# Patient Record
Sex: Female | Born: 1935 | Race: White | Hispanic: No | Smoking: Former smoker
Health system: Southern US, Community
[De-identification: ages and names within clinical notes are randomized; demographics above are authoritative.]

## PROBLEM LIST (undated history)

## (undated) DIAGNOSIS — M1990 Unspecified osteoarthritis, unspecified site: Secondary | ICD-10-CM

## (undated) DIAGNOSIS — J189 Pneumonia, unspecified organism: Secondary | ICD-10-CM

## (undated) DIAGNOSIS — I10 Essential (primary) hypertension: Secondary | ICD-10-CM

## (undated) DIAGNOSIS — R339 Retention of urine, unspecified: Secondary | ICD-10-CM

## (undated) DIAGNOSIS — G43909 Migraine, unspecified, not intractable, without status migrainosus: Secondary | ICD-10-CM

## (undated) DIAGNOSIS — M797 Fibromyalgia: Secondary | ICD-10-CM

## (undated) DIAGNOSIS — G4733 Obstructive sleep apnea (adult) (pediatric): Secondary | ICD-10-CM

## (undated) DIAGNOSIS — I480 Paroxysmal atrial fibrillation: Secondary | ICD-10-CM

## (undated) DIAGNOSIS — D649 Anemia, unspecified: Secondary | ICD-10-CM

## (undated) DIAGNOSIS — F419 Anxiety disorder, unspecified: Secondary | ICD-10-CM

## (undated) DIAGNOSIS — R011 Cardiac murmur, unspecified: Secondary | ICD-10-CM

## (undated) DIAGNOSIS — K219 Gastro-esophageal reflux disease without esophagitis: Secondary | ICD-10-CM

## (undated) DIAGNOSIS — I2699 Other pulmonary embolism without acute cor pulmonale: Secondary | ICD-10-CM

## (undated) DIAGNOSIS — J45909 Unspecified asthma, uncomplicated: Secondary | ICD-10-CM

## (undated) DIAGNOSIS — C84A0 Cutaneous T-cell lymphoma, unspecified, unspecified site: Secondary | ICD-10-CM

## (undated) DIAGNOSIS — R310 Gross hematuria: Secondary | ICD-10-CM

## (undated) DIAGNOSIS — I509 Heart failure, unspecified: Secondary | ICD-10-CM

## (undated) DIAGNOSIS — E669 Obesity, unspecified: Secondary | ICD-10-CM

## (undated) DIAGNOSIS — N6019 Diffuse cystic mastopathy of unspecified breast: Secondary | ICD-10-CM

## (undated) DIAGNOSIS — N952 Postmenopausal atrophic vaginitis: Secondary | ICD-10-CM

## (undated) DIAGNOSIS — O2230 Deep phlebothrombosis in pregnancy, unspecified trimester: Secondary | ICD-10-CM

## (undated) DIAGNOSIS — K279 Peptic ulcer, site unspecified, unspecified as acute or chronic, without hemorrhage or perforation: Secondary | ICD-10-CM

## (undated) DIAGNOSIS — I4719 Other supraventricular tachycardia: Secondary | ICD-10-CM

## (undated) DIAGNOSIS — K5792 Diverticulitis of intestine, part unspecified, without perforation or abscess without bleeding: Secondary | ICD-10-CM

## (undated) DIAGNOSIS — I422 Other hypertrophic cardiomyopathy: Secondary | ICD-10-CM

## (undated) DIAGNOSIS — Z9889 Other specified postprocedural states: Secondary | ICD-10-CM

## (undated) DIAGNOSIS — M109 Gout, unspecified: Secondary | ICD-10-CM

## (undated) DIAGNOSIS — I671 Cerebral aneurysm, nonruptured: Secondary | ICD-10-CM

## (undated) DIAGNOSIS — E785 Hyperlipidemia, unspecified: Secondary | ICD-10-CM

## (undated) DIAGNOSIS — I471 Supraventricular tachycardia: Secondary | ICD-10-CM

## (undated) DIAGNOSIS — J449 Chronic obstructive pulmonary disease, unspecified: Secondary | ICD-10-CM

## (undated) DIAGNOSIS — R251 Tremor, unspecified: Secondary | ICD-10-CM

## (undated) HISTORY — DX: Diffuse cystic mastopathy of unspecified breast: N60.19

## (undated) HISTORY — DX: Peptic ulcer, site unspecified, unspecified as acute or chronic, without hemorrhage or perforation: K27.9

## (undated) HISTORY — PX: BLADDER SUSPENSION: SHX72

## (undated) HISTORY — PX: ABDOMINAL HYSTERECTOMY: SHX81

## (undated) HISTORY — DX: Other pulmonary embolism without acute cor pulmonale: I26.99

## (undated) HISTORY — DX: Unspecified osteoarthritis, unspecified site: M19.90

## (undated) HISTORY — DX: Supraventricular tachycardia: I47.1

## (undated) HISTORY — DX: Other specified postprocedural states: Z98.890

## (undated) HISTORY — DX: Obstructive sleep apnea (adult) (pediatric): G47.33

## (undated) HISTORY — PX: TONSILLECTOMY AND ADENOIDECTOMY: SUR1326

## (undated) HISTORY — DX: Chronic obstructive pulmonary disease, unspecified: J44.9

## (undated) HISTORY — DX: Cutaneous T-cell lymphoma, unspecified, unspecified site: C84.A0

## (undated) HISTORY — DX: Cerebral aneurysm, nonruptured: I67.1

## (undated) HISTORY — DX: Other supraventricular tachycardia: I47.19

## (undated) HISTORY — PX: TOTAL KNEE ARTHROPLASTY: SHX125

## (undated) HISTORY — DX: Obesity, unspecified: E66.9

## (undated) HISTORY — PX: FOOT SURGERY: SHX648

## (undated) HISTORY — DX: Migraine, unspecified, not intractable, without status migrainosus: G43.909

## (undated) HISTORY — DX: Diverticulitis of intestine, part unspecified, without perforation or abscess without bleeding: K57.92

## (undated) HISTORY — DX: Deep phlebothrombosis in pregnancy, unspecified trimester: O22.30

## (undated) HISTORY — DX: Retention of urine, unspecified: R33.9

## (undated) HISTORY — DX: Essential (primary) hypertension: I10

## (undated) HISTORY — DX: Anemia, unspecified: D64.9

## (undated) HISTORY — DX: Postmenopausal atrophic vaginitis: N95.2

## (undated) HISTORY — DX: Gastro-esophageal reflux disease without esophagitis: K21.9

## (undated) HISTORY — PX: APPENDECTOMY: SHX54

## (undated) HISTORY — DX: Anxiety disorder, unspecified: F41.9

## (undated) HISTORY — DX: Other hypertrophic cardiomyopathy: I42.2

## (undated) HISTORY — DX: Gout, unspecified: M10.9

## (undated) HISTORY — DX: Hyperlipidemia, unspecified: E78.5

## (undated) HISTORY — DX: Cardiac murmur, unspecified: R01.1

## (undated) HISTORY — DX: Fibromyalgia: M79.7

## (undated) HISTORY — DX: Paroxysmal atrial fibrillation: I48.0

## (undated) HISTORY — DX: Gross hematuria: R31.0

## (undated) HISTORY — DX: Tremor, unspecified: R25.1

## (undated) HISTORY — DX: Pneumonia, unspecified organism: J18.9

## (undated) HISTORY — PX: CARDIOVASCULAR STRESS TEST: SHX262

## (undated) HISTORY — PX: OTHER SURGICAL HISTORY: SHX169

---

## 1966-02-14 HISTORY — PX: INGUINAL HERNIA REPAIR: SUR1180

## 1970-02-14 HISTORY — PX: SHOULDER SURGERY: SHX246

## 1996-02-15 HISTORY — PX: CEREBRAL ANEURYSM REPAIR: SHX164

## 1998-02-16 ENCOUNTER — Encounter (INDEPENDENT_AMBULATORY_CARE_PROVIDER_SITE_OTHER): Payer: Self-pay | Admitting: *Deleted

## 1998-02-16 ENCOUNTER — Encounter (HOSPITAL_BASED_OUTPATIENT_CLINIC_OR_DEPARTMENT_OTHER): Payer: Self-pay | Admitting: Internal Medicine

## 1998-02-16 ENCOUNTER — Ambulatory Visit (HOSPITAL_COMMUNITY): Admission: RE | Admit: 1998-02-16 | Discharge: 1998-02-16 | Payer: Self-pay | Admitting: Internal Medicine

## 1998-04-14 ENCOUNTER — Ambulatory Visit (HOSPITAL_BASED_OUTPATIENT_CLINIC_OR_DEPARTMENT_OTHER): Admission: RE | Admit: 1998-04-14 | Discharge: 1998-04-14 | Payer: Self-pay | Admitting: Orthopaedic Surgery

## 1998-06-24 ENCOUNTER — Other Ambulatory Visit: Admission: RE | Admit: 1998-06-24 | Discharge: 1998-06-24 | Payer: Self-pay | Admitting: Internal Medicine

## 1998-12-03 ENCOUNTER — Encounter: Payer: Self-pay | Admitting: Internal Medicine

## 1999-04-16 ENCOUNTER — Ambulatory Visit (HOSPITAL_BASED_OUTPATIENT_CLINIC_OR_DEPARTMENT_OTHER): Admission: RE | Admit: 1999-04-16 | Discharge: 1999-04-16 | Payer: Self-pay | Admitting: Podiatry

## 1999-07-21 ENCOUNTER — Inpatient Hospital Stay (HOSPITAL_COMMUNITY): Admission: AD | Admit: 1999-07-21 | Discharge: 1999-07-22 | Payer: Self-pay | Admitting: *Deleted

## 1999-07-21 ENCOUNTER — Encounter: Payer: Self-pay | Admitting: *Deleted

## 1999-11-09 ENCOUNTER — Encounter (INDEPENDENT_AMBULATORY_CARE_PROVIDER_SITE_OTHER): Payer: Self-pay | Admitting: Specialist

## 1999-11-09 ENCOUNTER — Encounter: Payer: Self-pay | Admitting: Internal Medicine

## 1999-11-09 ENCOUNTER — Encounter (INDEPENDENT_AMBULATORY_CARE_PROVIDER_SITE_OTHER): Payer: Self-pay | Admitting: *Deleted

## 1999-11-09 ENCOUNTER — Other Ambulatory Visit: Admission: RE | Admit: 1999-11-09 | Discharge: 1999-11-09 | Payer: Self-pay | Admitting: Internal Medicine

## 2001-02-16 ENCOUNTER — Ambulatory Visit (HOSPITAL_COMMUNITY): Admission: RE | Admit: 2001-02-16 | Discharge: 2001-02-16 | Payer: Self-pay | Admitting: Internal Medicine

## 2001-02-16 ENCOUNTER — Encounter: Payer: Self-pay | Admitting: Internal Medicine

## 2001-03-28 ENCOUNTER — Ambulatory Visit (HOSPITAL_COMMUNITY): Admission: RE | Admit: 2001-03-28 | Discharge: 2001-03-28 | Payer: Self-pay | Admitting: Internal Medicine

## 2001-03-28 ENCOUNTER — Encounter: Payer: Self-pay | Admitting: Internal Medicine

## 2001-03-28 ENCOUNTER — Encounter (INDEPENDENT_AMBULATORY_CARE_PROVIDER_SITE_OTHER): Payer: Self-pay | Admitting: *Deleted

## 2002-06-07 ENCOUNTER — Ambulatory Visit (HOSPITAL_COMMUNITY): Admission: RE | Admit: 2002-06-07 | Discharge: 2002-06-07 | Payer: Self-pay | Admitting: Internal Medicine

## 2002-06-07 ENCOUNTER — Encounter: Payer: Self-pay | Admitting: Internal Medicine

## 2002-11-08 ENCOUNTER — Inpatient Hospital Stay (HOSPITAL_COMMUNITY): Admission: EM | Admit: 2002-11-08 | Discharge: 2002-11-13 | Payer: Self-pay | Admitting: Internal Medicine

## 2002-11-08 ENCOUNTER — Encounter: Payer: Self-pay | Admitting: Internal Medicine

## 2002-11-08 ENCOUNTER — Encounter (INDEPENDENT_AMBULATORY_CARE_PROVIDER_SITE_OTHER): Payer: Self-pay | Admitting: *Deleted

## 2002-11-11 ENCOUNTER — Encounter (INDEPENDENT_AMBULATORY_CARE_PROVIDER_SITE_OTHER): Payer: Self-pay

## 2002-11-11 ENCOUNTER — Encounter: Payer: Self-pay | Admitting: Internal Medicine

## 2002-12-17 ENCOUNTER — Encounter: Payer: Self-pay | Admitting: Internal Medicine

## 2004-04-01 ENCOUNTER — Ambulatory Visit: Payer: Self-pay | Admitting: Internal Medicine

## 2004-05-11 ENCOUNTER — Ambulatory Visit: Payer: Self-pay | Admitting: Hematology & Oncology

## 2004-06-14 ENCOUNTER — Ambulatory Visit: Payer: Self-pay | Admitting: Internal Medicine

## 2004-06-22 ENCOUNTER — Ambulatory Visit: Payer: Self-pay | Admitting: Internal Medicine

## 2004-06-29 ENCOUNTER — Ambulatory Visit: Payer: Self-pay | Admitting: Hematology & Oncology

## 2004-07-06 ENCOUNTER — Ambulatory Visit: Payer: Self-pay | Admitting: Internal Medicine

## 2004-10-06 ENCOUNTER — Ambulatory Visit: Payer: Self-pay | Admitting: Internal Medicine

## 2004-10-21 ENCOUNTER — Ambulatory Visit: Payer: Self-pay | Admitting: General Surgery

## 2004-10-27 ENCOUNTER — Ambulatory Visit: Payer: Self-pay | Admitting: Internal Medicine

## 2004-10-28 ENCOUNTER — Ambulatory Visit (HOSPITAL_COMMUNITY): Admission: RE | Admit: 2004-10-28 | Discharge: 2004-10-28 | Payer: Self-pay | Admitting: Internal Medicine

## 2004-10-29 ENCOUNTER — Ambulatory Visit: Payer: Self-pay | Admitting: Internal Medicine

## 2004-11-08 ENCOUNTER — Ambulatory Visit: Payer: Self-pay | Admitting: General Surgery

## 2005-04-25 ENCOUNTER — Ambulatory Visit: Payer: Self-pay | Admitting: General Surgery

## 2005-10-04 ENCOUNTER — Ambulatory Visit: Payer: Self-pay | Admitting: Internal Medicine

## 2005-10-24 ENCOUNTER — Ambulatory Visit: Payer: Self-pay | Admitting: General Surgery

## 2005-10-27 ENCOUNTER — Ambulatory Visit: Payer: Self-pay | Admitting: General Surgery

## 2005-12-05 ENCOUNTER — Ambulatory Visit: Payer: Self-pay | Admitting: Internal Medicine

## 2006-02-23 ENCOUNTER — Ambulatory Visit: Payer: Self-pay | Admitting: Internal Medicine

## 2006-04-17 ENCOUNTER — Ambulatory Visit: Payer: Self-pay | Admitting: General Surgery

## 2006-05-08 ENCOUNTER — Ambulatory Visit: Payer: Self-pay | Admitting: Internal Medicine

## 2006-05-08 ENCOUNTER — Inpatient Hospital Stay (HOSPITAL_COMMUNITY): Admission: RE | Admit: 2006-05-08 | Discharge: 2006-05-11 | Payer: Self-pay | Admitting: Orthopedic Surgery

## 2006-10-27 ENCOUNTER — Ambulatory Visit: Payer: Self-pay | Admitting: General Surgery

## 2006-11-03 ENCOUNTER — Ambulatory Visit: Payer: Self-pay | Admitting: Internal Medicine

## 2006-11-03 LAB — CONVERTED CEMR LAB
ALT: 19 units/L (ref 0–35)
Albumin: 3.7 g/dL (ref 3.5–5.2)
CRP, High Sensitivity: 16 — ABNORMAL HIGH (ref 0.00–5.00)
Cholesterol: 231 mg/dL (ref 0–200)
Creatinine, Ser: 0.6 mg/dL (ref 0.4–1.2)
Direct LDL: 97.1 mg/dL
Eosinophils Absolute: 0.2 10*3/uL (ref 0.0–0.6)
Eosinophils Relative: 2.5 % (ref 0.0–5.0)
GFR calc Af Amer: 126 mL/min
Glucose, Bld: 78 mg/dL (ref 70–99)
HCT: 31 % — ABNORMAL LOW (ref 36.0–46.0)
Hemoglobin, Urine: NEGATIVE
Hemoglobin: 10.7 g/dL — ABNORMAL LOW (ref 12.0–15.0)
Ketones, ur: NEGATIVE mg/dL
Lymphocytes Relative: 26.8 % (ref 12.0–46.0)
MCHC: 34.5 g/dL (ref 30.0–36.0)
Monocytes Relative: 9 % (ref 3.0–11.0)
Mucus, UA: NEGATIVE
Platelets: 253 10*3/uL (ref 150–400)
Sodium: 143 meq/L (ref 135–145)
TSH: 2.48 microintl units/mL (ref 0.35–5.50)
Total Bilirubin: 0.5 mg/dL (ref 0.3–1.2)
Total CHOL/HDL Ratio: 5.1
Total Protein: 6.2 g/dL (ref 6.0–8.3)
Triglycerides: 442 mg/dL (ref 0–149)
Urine Glucose: NEGATIVE mg/dL
Urobilinogen, UA: 0.2 (ref 0.0–1.0)
pH: 6 (ref 5.0–8.0)

## 2006-11-20 ENCOUNTER — Ambulatory Visit: Payer: Self-pay | Admitting: Hematology & Oncology

## 2006-12-10 ENCOUNTER — Encounter: Admission: RE | Admit: 2006-12-10 | Discharge: 2006-12-10 | Payer: Self-pay | Admitting: Orthopedic Surgery

## 2006-12-15 ENCOUNTER — Ambulatory Visit: Payer: Self-pay | Admitting: Internal Medicine

## 2006-12-29 ENCOUNTER — Encounter: Payer: Self-pay | Admitting: Internal Medicine

## 2006-12-29 LAB — CBC & DIFF AND RETIC
BASO%: 0.3 % (ref 0.0–2.0)
Basophils Absolute: 0 10*3/uL (ref 0.0–0.1)
EOS%: 4.1 % (ref 0.0–7.0)
HCT: 33.9 % — ABNORMAL LOW (ref 34.8–46.6)
LYMPH%: 27.1 % (ref 14.0–48.0)
MCH: 32.8 pg (ref 26.0–34.0)
MCHC: 36 g/dL (ref 32.0–36.0)
MCV: 91 fL (ref 81.0–101.0)
MONO%: 8 % (ref 0.0–13.0)
NEUT%: 60.5 % (ref 39.6–76.8)
Platelets: 264 10*3/uL (ref 145–400)

## 2006-12-29 LAB — CHCC SMEAR

## 2007-01-01 ENCOUNTER — Ambulatory Visit: Payer: Self-pay | Admitting: Internal Medicine

## 2007-01-02 LAB — PROTEIN ELECTROPHORESIS, SERUM
Alpha-1-Globulin: 5.8 % — ABNORMAL HIGH (ref 2.9–4.9)
Alpha-2-Globulin: 14.3 % — ABNORMAL HIGH (ref 7.1–11.8)
Total Protein, Serum Electrophoresis: 6.9 g/dL (ref 6.0–8.3)

## 2007-01-02 LAB — TRANSFERRIN RECEPTOR, SOLUABLE: Transferrin Receptor, Soluble: 17.9 nmol/L

## 2007-01-02 LAB — ERYTHROPOIETIN: Erythropoietin: 14.1 m[IU]/mL (ref 2.6–34.0)

## 2007-01-02 LAB — VITAMIN B12: Vitamin B-12: 602 pg/mL (ref 211–911)

## 2007-01-09 ENCOUNTER — Ambulatory Visit: Payer: Self-pay | Admitting: Internal Medicine

## 2007-01-09 ENCOUNTER — Encounter: Payer: Self-pay | Admitting: Internal Medicine

## 2007-01-17 ENCOUNTER — Encounter: Payer: Self-pay | Admitting: Internal Medicine

## 2007-02-01 ENCOUNTER — Telehealth: Payer: Self-pay | Admitting: Internal Medicine

## 2007-02-03 ENCOUNTER — Encounter: Payer: Self-pay | Admitting: *Deleted

## 2007-02-03 DIAGNOSIS — Z8679 Personal history of other diseases of the circulatory system: Secondary | ICD-10-CM | POA: Insufficient documentation

## 2007-02-03 DIAGNOSIS — Z86718 Personal history of other venous thrombosis and embolism: Secondary | ICD-10-CM

## 2007-02-03 DIAGNOSIS — I10 Essential (primary) hypertension: Secondary | ICD-10-CM

## 2007-02-03 DIAGNOSIS — R5382 Chronic fatigue, unspecified: Secondary | ICD-10-CM

## 2007-02-03 DIAGNOSIS — G43909 Migraine, unspecified, not intractable, without status migrainosus: Secondary | ICD-10-CM | POA: Insufficient documentation

## 2007-02-03 DIAGNOSIS — I671 Cerebral aneurysm, nonruptured: Secondary | ICD-10-CM

## 2007-02-03 DIAGNOSIS — D539 Nutritional anemia, unspecified: Secondary | ICD-10-CM | POA: Insufficient documentation

## 2007-02-03 DIAGNOSIS — IMO0001 Reserved for inherently not codable concepts without codable children: Secondary | ICD-10-CM

## 2007-02-03 DIAGNOSIS — E785 Hyperlipidemia, unspecified: Secondary | ICD-10-CM

## 2007-02-03 DIAGNOSIS — Z9889 Other specified postprocedural states: Secondary | ICD-10-CM

## 2007-02-03 DIAGNOSIS — Z96659 Presence of unspecified artificial knee joint: Secondary | ICD-10-CM

## 2007-02-03 DIAGNOSIS — Z9079 Acquired absence of other genital organ(s): Secondary | ICD-10-CM | POA: Insufficient documentation

## 2007-02-03 DIAGNOSIS — Z9089 Acquired absence of other organs: Secondary | ICD-10-CM

## 2007-02-03 DIAGNOSIS — K219 Gastro-esophageal reflux disease without esophagitis: Secondary | ICD-10-CM | POA: Insufficient documentation

## 2007-02-03 DIAGNOSIS — M1990 Unspecified osteoarthritis, unspecified site: Secondary | ICD-10-CM | POA: Insufficient documentation

## 2007-02-15 HISTORY — PX: CARDIAC CATHETERIZATION: SHX172

## 2007-02-15 HISTORY — PX: KNEE ARTHROSCOPY: SUR90

## 2007-02-21 ENCOUNTER — Ambulatory Visit: Payer: Self-pay | Admitting: Hematology & Oncology

## 2007-02-22 ENCOUNTER — Ambulatory Visit: Payer: Self-pay | Admitting: Internal Medicine

## 2007-02-22 LAB — CONVERTED CEMR LAB
AST: 17 units/L (ref 0–37)
Basophils Absolute: 0 10*3/uL (ref 0.0–0.1)
Basophils Relative: 0.2 % (ref 0.0–1.0)
MCHC: 35.9 g/dL (ref 30.0–36.0)
MCV: 90.6 fL (ref 78.0–100.0)
Platelets: 204 10*3/uL (ref 150–400)
WBC: 6.1 10*3/uL (ref 4.5–10.5)

## 2007-02-25 ENCOUNTER — Encounter: Payer: Self-pay | Admitting: Internal Medicine

## 2007-04-12 DIAGNOSIS — K449 Diaphragmatic hernia without obstruction or gangrene: Secondary | ICD-10-CM | POA: Insufficient documentation

## 2007-04-12 DIAGNOSIS — D126 Benign neoplasm of colon, unspecified: Secondary | ICD-10-CM | POA: Insufficient documentation

## 2007-04-12 DIAGNOSIS — K5730 Diverticulosis of large intestine without perforation or abscess without bleeding: Secondary | ICD-10-CM | POA: Insufficient documentation

## 2007-06-04 ENCOUNTER — Ambulatory Visit: Payer: Self-pay | Admitting: Cardiology

## 2007-06-06 ENCOUNTER — Ambulatory Visit: Payer: Self-pay

## 2007-06-06 ENCOUNTER — Encounter: Payer: Self-pay | Admitting: Cardiology

## 2007-06-21 ENCOUNTER — Telehealth (INDEPENDENT_AMBULATORY_CARE_PROVIDER_SITE_OTHER): Payer: Self-pay | Admitting: *Deleted

## 2007-06-28 ENCOUNTER — Ambulatory Visit: Payer: Self-pay | Admitting: Cardiology

## 2007-06-28 LAB — CONVERTED CEMR LAB
BUN: 17 mg/dL (ref 6–23)
Basophils Absolute: 0 10*3/uL (ref 0.0–0.1)
Basophils Relative: 0 % (ref 0–1)
CO2: 23 meq/L (ref 19–32)
Calcium: 9.6 mg/dL (ref 8.4–10.5)
Chloride: 99 meq/L (ref 96–112)
Creatinine, Ser: 0.83 mg/dL (ref 0.40–1.20)
Eosinophils Relative: 0 % (ref 0–5)
Lymphs Abs: 1.6 10*3/uL (ref 0.7–4.0)
MCV: 95.7 fL (ref 78.0–100.0)
Monocytes Absolute: 0.6 10*3/uL (ref 0.1–1.0)
Monocytes Relative: 8 % (ref 3–12)
Neutrophils Relative %: 70 % (ref 43–77)
Potassium: 3.6 meq/L (ref 3.5–5.3)
Prothrombin Time: 13.4 s (ref 11.6–15.2)
RDW: 14 % (ref 11.5–15.5)
Sodium: 137 meq/L (ref 135–145)

## 2007-07-06 ENCOUNTER — Inpatient Hospital Stay (HOSPITAL_BASED_OUTPATIENT_CLINIC_OR_DEPARTMENT_OTHER): Admission: RE | Admit: 2007-07-06 | Discharge: 2007-07-06 | Payer: Self-pay | Admitting: Internal Medicine

## 2007-07-06 ENCOUNTER — Ambulatory Visit: Payer: Self-pay | Admitting: Internal Medicine

## 2007-07-27 ENCOUNTER — Ambulatory Visit: Payer: Self-pay | Admitting: Cardiology

## 2007-08-07 ENCOUNTER — Ambulatory Visit: Payer: Self-pay | Admitting: Internal Medicine

## 2007-08-08 ENCOUNTER — Encounter (INDEPENDENT_AMBULATORY_CARE_PROVIDER_SITE_OTHER): Payer: Self-pay | Admitting: Family Medicine

## 2007-08-21 ENCOUNTER — Ambulatory Visit: Payer: Self-pay | Admitting: Internal Medicine

## 2007-08-21 ENCOUNTER — Encounter: Payer: Self-pay | Admitting: Internal Medicine

## 2007-08-23 ENCOUNTER — Encounter: Payer: Self-pay | Admitting: Internal Medicine

## 2007-08-28 ENCOUNTER — Ambulatory Visit: Payer: Self-pay | Admitting: Cardiology

## 2007-08-28 ENCOUNTER — Telehealth: Payer: Self-pay | Admitting: Internal Medicine

## 2007-08-29 ENCOUNTER — Ambulatory Visit: Payer: Self-pay | Admitting: Internal Medicine

## 2007-08-30 ENCOUNTER — Telehealth: Payer: Self-pay | Admitting: Internal Medicine

## 2007-08-30 LAB — CONVERTED CEMR LAB
BUN: 17 mg/dL (ref 6–23)
Basophils Absolute: 0 10*3/uL (ref 0.0–0.1)
Basophils Relative: 0.7 % (ref 0.0–1.0)
CO2: 24 meq/L (ref 19–32)
Calcium: 9.9 mg/dL (ref 8.4–10.5)
Chloride: 108 meq/L (ref 96–112)
Creatinine, Ser: 0.8 mg/dL (ref 0.4–1.2)
Eosinophils Relative: 1.8 % (ref 0.0–5.0)
GFR calc non Af Amer: 75 mL/min
Glucose, Bld: 117 mg/dL — ABNORMAL HIGH (ref 70–99)
HCT: 35 % — ABNORMAL LOW (ref 36.0–46.0)
Hemoglobin, Urine: NEGATIVE
Hemoglobin: 12.1 g/dL (ref 12.0–15.0)
Leukocytes, UA: NEGATIVE
MCHC: 34.6 g/dL (ref 30.0–36.0)
Monocytes Relative: 10.3 % (ref 3.0–12.0)
Neutro Abs: 4.7 10*3/uL (ref 1.4–7.7)
Platelets: 236 10*3/uL (ref 150–400)
Potassium: 4.8 meq/L (ref 3.5–5.1)
Urobilinogen, UA: 0.2 (ref 0.0–1.0)
WBC: 7 10*3/uL (ref 4.5–10.5)

## 2007-09-04 ENCOUNTER — Encounter (INDEPENDENT_AMBULATORY_CARE_PROVIDER_SITE_OTHER): Payer: Self-pay | Admitting: *Deleted

## 2007-09-04 ENCOUNTER — Ambulatory Visit (HOSPITAL_COMMUNITY): Admission: RE | Admit: 2007-09-04 | Discharge: 2007-09-04 | Payer: Self-pay | Admitting: Internal Medicine

## 2007-10-16 ENCOUNTER — Ambulatory Visit: Payer: Self-pay | Admitting: Cardiology

## 2007-10-16 ENCOUNTER — Telehealth: Payer: Self-pay | Admitting: Internal Medicine

## 2007-10-30 ENCOUNTER — Ambulatory Visit: Payer: Self-pay | Admitting: General Surgery

## 2007-11-06 ENCOUNTER — Ambulatory Visit: Payer: Self-pay | Admitting: Internal Medicine

## 2007-11-06 DIAGNOSIS — I421 Obstructive hypertrophic cardiomyopathy: Secondary | ICD-10-CM | POA: Insufficient documentation

## 2007-11-06 DIAGNOSIS — I951 Orthostatic hypotension: Secondary | ICD-10-CM | POA: Insufficient documentation

## 2007-11-07 ENCOUNTER — Telehealth: Payer: Self-pay | Admitting: Internal Medicine

## 2007-11-07 ENCOUNTER — Encounter: Payer: Self-pay | Admitting: Internal Medicine

## 2007-11-09 ENCOUNTER — Encounter: Payer: Self-pay | Admitting: Internal Medicine

## 2007-11-13 ENCOUNTER — Telehealth: Payer: Self-pay | Admitting: Internal Medicine

## 2007-11-27 ENCOUNTER — Encounter: Payer: Self-pay | Admitting: Internal Medicine

## 2007-12-03 ENCOUNTER — Telehealth: Payer: Self-pay | Admitting: Internal Medicine

## 2008-01-02 ENCOUNTER — Ambulatory Visit: Payer: Self-pay | Admitting: Ophthalmology

## 2008-01-14 ENCOUNTER — Ambulatory Visit: Payer: Self-pay | Admitting: Ophthalmology

## 2008-02-06 ENCOUNTER — Ambulatory Visit: Payer: Self-pay | Admitting: Ophthalmology

## 2008-02-12 ENCOUNTER — Telehealth (INDEPENDENT_AMBULATORY_CARE_PROVIDER_SITE_OTHER): Payer: Self-pay | Admitting: *Deleted

## 2008-03-03 ENCOUNTER — Ambulatory Visit: Payer: Self-pay | Admitting: Ophthalmology

## 2008-03-19 ENCOUNTER — Encounter: Payer: Self-pay | Admitting: Internal Medicine

## 2008-05-07 ENCOUNTER — Ambulatory Visit: Payer: Self-pay | Admitting: Cardiology

## 2008-05-07 ENCOUNTER — Encounter: Payer: Self-pay | Admitting: Internal Medicine

## 2008-05-13 ENCOUNTER — Ambulatory Visit: Payer: Self-pay | Admitting: Cardiovascular Disease

## 2008-06-12 ENCOUNTER — Telehealth: Payer: Self-pay | Admitting: Internal Medicine

## 2008-08-22 ENCOUNTER — Telehealth: Payer: Self-pay | Admitting: Internal Medicine

## 2008-08-25 ENCOUNTER — Telehealth: Payer: Self-pay | Admitting: Internal Medicine

## 2008-08-27 ENCOUNTER — Ambulatory Visit: Payer: Self-pay | Admitting: Internal Medicine

## 2008-08-27 DIAGNOSIS — R5381 Other malaise: Secondary | ICD-10-CM | POA: Insufficient documentation

## 2008-08-27 DIAGNOSIS — R5383 Other fatigue: Secondary | ICD-10-CM

## 2008-08-28 ENCOUNTER — Telehealth: Payer: Self-pay | Admitting: Internal Medicine

## 2008-08-29 ENCOUNTER — Ambulatory Visit: Payer: Self-pay | Admitting: Cardiology

## 2008-08-29 ENCOUNTER — Encounter: Payer: Self-pay | Admitting: Cardiology

## 2008-09-01 ENCOUNTER — Telehealth: Payer: Self-pay | Admitting: Internal Medicine

## 2008-09-02 ENCOUNTER — Inpatient Hospital Stay (HOSPITAL_COMMUNITY): Admission: AD | Admit: 2008-09-02 | Discharge: 2008-09-05 | Payer: Self-pay | Admitting: Gastroenterology

## 2008-09-02 ENCOUNTER — Ambulatory Visit: Payer: Self-pay | Admitting: Gastroenterology

## 2008-09-02 ENCOUNTER — Encounter (INDEPENDENT_AMBULATORY_CARE_PROVIDER_SITE_OTHER): Payer: Self-pay | Admitting: *Deleted

## 2008-09-02 DIAGNOSIS — Z860100 Personal history of colon polyps, unspecified: Secondary | ICD-10-CM | POA: Insufficient documentation

## 2008-09-08 ENCOUNTER — Telehealth: Payer: Self-pay | Admitting: Internal Medicine

## 2008-09-08 ENCOUNTER — Ambulatory Visit: Payer: Self-pay | Admitting: Internal Medicine

## 2008-09-08 LAB — CONVERTED CEMR LAB

## 2008-09-14 DIAGNOSIS — O2230 Deep phlebothrombosis in pregnancy, unspecified trimester: Secondary | ICD-10-CM

## 2008-09-14 HISTORY — DX: Deep phlebothrombosis in pregnancy, unspecified trimester: O22.30

## 2008-09-15 ENCOUNTER — Telehealth: Payer: Self-pay | Admitting: Internal Medicine

## 2008-09-17 ENCOUNTER — Ambulatory Visit: Payer: Self-pay | Admitting: Internal Medicine

## 2008-09-17 ENCOUNTER — Telehealth: Payer: Self-pay | Admitting: Internal Medicine

## 2008-09-17 LAB — CONVERTED CEMR LAB
Bilirubin Urine: NEGATIVE
Hemoglobin, Urine: NEGATIVE
Leukocytes, UA: NEGATIVE
Specific Gravity, Urine: 1.02 (ref 1.000–1.030)
Total Protein, Urine: NEGATIVE mg/dL
Urine Glucose: NEGATIVE mg/dL
Urobilinogen, UA: 0.2 (ref 0.0–1.0)

## 2008-10-06 ENCOUNTER — Telehealth: Payer: Self-pay | Admitting: Internal Medicine

## 2008-10-09 ENCOUNTER — Ambulatory Visit: Payer: Self-pay | Admitting: Cardiovascular Disease

## 2008-10-09 ENCOUNTER — Ambulatory Visit: Payer: Self-pay | Admitting: Internal Medicine

## 2008-10-09 ENCOUNTER — Encounter: Payer: Self-pay | Admitting: Cardiology

## 2008-10-09 ENCOUNTER — Telehealth: Payer: Self-pay | Admitting: Internal Medicine

## 2008-10-09 ENCOUNTER — Ambulatory Visit: Payer: Self-pay

## 2008-10-09 DIAGNOSIS — K253 Acute gastric ulcer without hemorrhage or perforation: Secondary | ICD-10-CM | POA: Insufficient documentation

## 2008-10-09 DIAGNOSIS — I82409 Acute embolism and thrombosis of unspecified deep veins of unspecified lower extremity: Secondary | ICD-10-CM | POA: Insufficient documentation

## 2008-10-09 LAB — CONVERTED CEMR LAB: POC INR: 1

## 2008-10-13 ENCOUNTER — Ambulatory Visit: Payer: Self-pay | Admitting: Cardiovascular Disease

## 2008-10-13 ENCOUNTER — Encounter: Payer: Self-pay | Admitting: Cardiovascular Disease

## 2008-10-13 DIAGNOSIS — I8010 Phlebitis and thrombophlebitis of unspecified femoral vein: Secondary | ICD-10-CM

## 2008-10-13 LAB — CONVERTED CEMR LAB: POC INR: 1.7

## 2008-10-15 ENCOUNTER — Ambulatory Visit: Payer: Self-pay | Admitting: Internal Medicine

## 2008-10-15 LAB — CONVERTED CEMR LAB: POC INR: 1.8

## 2008-10-17 ENCOUNTER — Ambulatory Visit: Payer: Self-pay | Admitting: Internal Medicine

## 2008-10-23 ENCOUNTER — Ambulatory Visit: Payer: Self-pay | Admitting: Cardiovascular Disease

## 2008-10-23 ENCOUNTER — Ambulatory Visit: Payer: Self-pay | Admitting: Internal Medicine

## 2008-10-23 LAB — CONVERTED CEMR LAB: POC INR: 3.1

## 2008-10-30 ENCOUNTER — Telehealth: Payer: Self-pay | Admitting: Internal Medicine

## 2008-11-04 ENCOUNTER — Telehealth (INDEPENDENT_AMBULATORY_CARE_PROVIDER_SITE_OTHER): Payer: Self-pay | Admitting: *Deleted

## 2008-11-17 ENCOUNTER — Ambulatory Visit: Payer: Self-pay | Admitting: Internal Medicine

## 2008-11-20 ENCOUNTER — Ambulatory Visit: Payer: Self-pay | Admitting: General Surgery

## 2008-12-08 ENCOUNTER — Ambulatory Visit: Payer: Self-pay | Admitting: Cardiovascular Disease

## 2008-12-08 LAB — CONVERTED CEMR LAB: POC INR: 2.8

## 2008-12-15 ENCOUNTER — Ambulatory Visit: Payer: Self-pay | Admitting: Internal Medicine

## 2008-12-19 ENCOUNTER — Inpatient Hospital Stay: Payer: Self-pay | Admitting: Student

## 2008-12-19 ENCOUNTER — Ambulatory Visit: Payer: Self-pay | Admitting: Internal Medicine

## 2009-01-02 ENCOUNTER — Encounter: Payer: Self-pay | Admitting: Internal Medicine

## 2009-01-13 ENCOUNTER — Encounter: Payer: Self-pay | Admitting: Cardiology

## 2009-01-13 ENCOUNTER — Telehealth (INDEPENDENT_AMBULATORY_CARE_PROVIDER_SITE_OTHER): Payer: Self-pay | Admitting: Cardiology

## 2009-01-14 ENCOUNTER — Ambulatory Visit: Payer: Self-pay | Admitting: Internal Medicine

## 2009-01-14 ENCOUNTER — Encounter: Payer: Self-pay | Admitting: Internal Medicine

## 2009-01-16 ENCOUNTER — Ambulatory Visit: Payer: Self-pay | Admitting: Internal Medicine

## 2009-02-04 ENCOUNTER — Ambulatory Visit: Payer: Self-pay | Admitting: Internal Medicine

## 2009-02-10 ENCOUNTER — Telehealth: Payer: Self-pay | Admitting: Internal Medicine

## 2009-02-14 ENCOUNTER — Ambulatory Visit: Payer: Self-pay | Admitting: Internal Medicine

## 2009-02-27 ENCOUNTER — Ambulatory Visit: Payer: Self-pay | Admitting: Internal Medicine

## 2009-03-03 ENCOUNTER — Ambulatory Visit: Payer: Self-pay | Admitting: Gastroenterology

## 2009-03-09 ENCOUNTER — Ambulatory Visit: Payer: Self-pay | Admitting: Gastroenterology

## 2009-03-17 ENCOUNTER — Ambulatory Visit: Payer: Self-pay | Admitting: Internal Medicine

## 2009-04-08 ENCOUNTER — Telehealth: Payer: Self-pay | Admitting: Cardiology

## 2009-04-09 ENCOUNTER — Observation Stay: Payer: Self-pay | Admitting: Internal Medicine

## 2009-04-09 ENCOUNTER — Ambulatory Visit: Payer: Self-pay | Admitting: Cardiovascular Disease

## 2009-04-10 ENCOUNTER — Encounter: Payer: Self-pay | Admitting: Cardiovascular Disease

## 2009-04-14 ENCOUNTER — Ambulatory Visit: Payer: Self-pay | Admitting: Internal Medicine

## 2009-05-13 ENCOUNTER — Ambulatory Visit: Payer: Self-pay | Admitting: Pain Medicine

## 2009-05-13 ENCOUNTER — Other Ambulatory Visit: Payer: Self-pay | Admitting: Pain Medicine

## 2009-07-29 ENCOUNTER — Ambulatory Visit: Payer: Self-pay | Admitting: Pain Medicine

## 2009-12-25 ENCOUNTER — Ambulatory Visit: Payer: Self-pay | Admitting: Internal Medicine

## 2009-12-25 ENCOUNTER — Encounter: Payer: Self-pay | Admitting: Internal Medicine

## 2010-01-14 ENCOUNTER — Encounter: Payer: Self-pay | Admitting: Internal Medicine

## 2010-03-14 LAB — CONVERTED CEMR LAB
Albumin: 3.9 g/dL (ref 3.5–5.2)
Alkaline Phosphatase: 88 units/L (ref 39–117)
Basophils Absolute: 0 10*3/uL (ref 0.0–0.1)
Bilirubin Urine: NEGATIVE
Creatinine, Ser: 0.7 mg/dL (ref 0.4–1.2)
Eosinophils Relative: 2.6 % (ref 0.0–5.0)
GFR calc non Af Amer: 86.95 mL/min (ref >60)
Glucose, Bld: 105 mg/dL — ABNORMAL HIGH (ref 70–99)
HCT: 35.5 % — ABNORMAL LOW (ref 36.0–46.0)
Hemoglobin, Urine: NEGATIVE
Ketones, ur: 15 mg/dL
Leukocytes, UA: NEGATIVE
Lipase: 5 units/L — ABNORMAL LOW (ref 11.0–59.0)
Lymphs Abs: 1.4 10*3/uL (ref 0.7–4.0)
MCHC: 34.7 g/dL (ref 30.0–36.0)
MCV: 92.7 fL (ref 78.0–100.0)
Monocytes Absolute: 0.3 10*3/uL (ref 0.1–1.0)
Neutro Abs: 4.7 10*3/uL (ref 1.4–7.7)
Neutrophils Relative %: 71.2 % (ref 43.0–77.0)
Nitrite: NEGATIVE
Potassium: 3.5 meq/L (ref 3.5–5.1)
Sed Rate: 19 mm/hr (ref 0–22)
Sodium: 145 meq/L (ref 135–145)
Total Bilirubin: 0.5 mg/dL (ref 0.3–1.2)
Total Protein, Urine: NEGATIVE mg/dL
Urine Glucose: NEGATIVE mg/dL
WBC: 6.6 10*3/uL (ref 4.5–10.5)
pH: 5.5 (ref 5.0–8.0)

## 2010-03-18 NOTE — Procedures (Signed)
Summary: EGD   EGD  Procedure date:  02/16/2001  Findings:      Location: [facility]    EGD  Procedure date:  02/16/2001  Findings:      Location: [facility]   Patient Name: Hailey [name], Hailey [name] MRN: [id number] Procedure Procedures: Panendoscopy (EGD) CPT: 43235.    with electrocoagulation and/ or injection for bleeding. CPT: 43255.  Personnel: Endoscopist: Wilhemina Bonito. Marina Goodell, MD.  Exam Location: Exam performed in Endoscopy Suite.  Patient Consent: Procedure, Alternatives, Risks and Benefits discussed, consent obtained,  Indications Symptoms: Abdominal pain, location: epigastric. Melena. documentation: patient report.  History  Pre-Exam Physical: Performed Feb 16, 2001  Entire physical exam was normal. Entire physical exam was normal.  Exam Exam Info: Maximum depth of insertion Duodenum, intended Duodenum. Patient position: on left side. Vocal cords visualized. Gastric retroflexion performed. Images taken. ASA Classification: II. Tolerance: excellent.  Sedation Meds: Residual sedation present from prior procedure today. Demerol 10 mg. given IV. Versed 3 mg. given IV.  Monitoring: BP and pulse monitoring done. Oximetry used. Supplemental O2 given  Fluoroscopy: Fluoroscopy was not used.  Findings - MUCOSAL ABNORMALITY: Antrum. Comment: [place] stomach with active oozing at time of endoscopy.  - Bicap/Coagulation: Antrum. Gold Probe used. Setting: 7. Total applications: 6. Outcome: hemostasis achieved. Comments: Area in prepyloric region treated.   Assessment  Diagnoses: [place] stomach (gastric antral vascular ectasia). .   Events  Unplanned Intervention: No unplanned interventions were required.  Unplanned Events: There were no complications. Plans Comments: Continue Nexium bid and hold celebrex for 2 weeks Disposition: After procedure patient sent to recovery. After recovery patient sent home.  Scheduling: Call office for  appointment, to Wilhemina Bonito. Marina Goodell, MD, in about 4 weeks    This report was created from the original endoscopy report, which was reviewed and signed by the above listed endoscopist.    cc. Micheal Norins,MD

## 2010-03-18 NOTE — Letter (Signed)
[facility]   Imported By: Harlon Flor 04/20/2009 15:49:47  _____________________________________________________________________  External Attachment:    Type:   Image     Comment:   External Document

## 2010-03-18 NOTE — Progress Notes (Signed)
Summary: chest pain   Phone Note Call from Patient   Caller: Daughter Summary of Call: pts daughter called to state that pt developed CP last night.  pt took several doses of antacids without relief.  took baby aspirin and had some relief.  pt began to have CP again this morning for which she took an additional aspirin.  Pt is currently experiencing some chest tightness.  daughter wanted pt to be seen in office this afternoon. instructed daughter that if pt was having active pain and/or tightness that she needed to be evaluated by ED.  pts daughter questioned if pt could not have an EKG done.  instructed daughter that EKG changes are not always present with MI- that bloodwork along with EKG is most diagnostic.  Pts daughter will discuss this with pt and let us know if we are needed. Initial call taken by: Charlena Cross, RN, BSN,  April 08, 2009 2:22 PM

## 2010-04-23 ENCOUNTER — Encounter: Payer: Self-pay | Admitting: Cardiology

## 2010-04-23 ENCOUNTER — Institutional Professional Consult (permissible substitution) (INDEPENDENT_AMBULATORY_CARE_PROVIDER_SITE_OTHER): Payer: Medicare Other | Admitting: Cardiology

## 2010-04-23 DIAGNOSIS — I422 Other hypertrophic cardiomyopathy: Secondary | ICD-10-CM

## 2010-04-30 ENCOUNTER — Telehealth: Payer: Self-pay | Admitting: Cardiology

## 2010-04-30 ENCOUNTER — Ambulatory Visit (HOSPITAL_COMMUNITY): Payer: Medicare Other | Attending: Cardiology

## 2010-04-30 ENCOUNTER — Encounter: Payer: Self-pay | Admitting: Cardiology

## 2010-04-30 DIAGNOSIS — I421 Obstructive hypertrophic cardiomyopathy: Secondary | ICD-10-CM | POA: Insufficient documentation

## 2010-05-03 ENCOUNTER — Other Ambulatory Visit (HOSPITAL_COMMUNITY): Payer: Medicare Other

## 2010-05-04 ENCOUNTER — Telehealth: Payer: Self-pay | Admitting: Cardiology

## 2010-05-04 ENCOUNTER — Ambulatory Visit: Payer: Self-pay | Admitting: General Practice

## 2010-05-04 NOTE — Assessment & Plan Note (Signed)
Summary: Surgical Clearance for Shoulder and Knee Repair/Was a Wall Pt...  Medications Added GABAPENTIN 100 MG CAPS (GABAPENTIN) take two tablets three times a day CITALOPRAM HYDROBROMIDE 40 MG TABS (CITALOPRAM HYDROBROMIDE) one daily TORSEMIDE 20 MG TABS (TORSEMIDE) 1 1/2 tablet once daily SPIRIVA HANDIHALER 18 MCG CAPS (TIOTROPIUM BROMIDE MONOHYDRATE) as needed ADVAIR DISKUS 250-50 MCG/DOSE AEPB (FLUTICASONE-SALMETEROL) 250/10 micrograms daily MELATONIN 3 MG TABS (MELATONIN) one tablet at bedtime MIRALAX  POWD (POLYETHYLENE GLYCOL 3350)       Allergies Added:   Visit Type:  Follow-up Referring Provider:  n/a Primary Provider:  Dr. Randa Lynn  CC:  Pre op clearance for surgery on left shoulder with Dr. Ernest Pine @ [facility] . Denies chest pain or shortness of breath..  History of Present Illness: 77 yo with history of hypertrophic cardiomyopathy, GI bleeding, venous thromboembolism, and COPD presents for cardiology evaluation prior to left rotator cuff repair.  Last echo in 4/09 showed asymmetry basal septal hypertrophy with an LVOT gradient to 130 mmHg with valsalva.  She was started on Toprol XL based on this finding.  She also had a cath in 2009 without significant CAD.  She now needs repair of left rotator cuff.  She has reasonably good exercise tolerance.  She does get short of breath walking up a flight of steps.  No dyspnea on flat ground though she tires easily.  She gets rare very atypical chest pain.  No syncope, no lightheadedness.    ECG: NSR, slight scooped inferior ST depression  Current Medications (verified): 1)  Prilosec Otc 20 Mg  Tbec (Omeprazole Magnesium) .... Take One Tablet Two Times A Day 2)  Vicodin 5-500 Mg  Tabs (Hydrocodone-Acetaminophen) .... As Needed 3)  Metoprolol Succinate 50 Mg Xr24h-Tab (Metoprolol Succinate) .Marland Kitchen.. 11/2 Once Daily 4)  Gabapentin 100 Mg Caps (Gabapentin) .... Take Two Tablets Three Times A Day 5)  Lovastatin 40 Mg Tabs (Lovastatin) .Marland Kitchen.. 1  By Mouth Once Daily 6)  Citalopram Hydrobromide 40 Mg Tabs (Citalopram Hydrobromide) .... One Daily 7)  Torsemide 20 Mg Tabs (Torsemide) .Marland Kitchen.. 1 1/2 Tablet Once Daily 8)  Spiriva Handihaler 18 Mcg Caps (Tiotropium Bromide Monohydrate) .... As Needed 9)  Advair Diskus 250-50 Mcg/dose Aepb (Fluticasone-Salmeterol) .... 250/10 Micrograms Daily 10)  Melatonin 3 Mg Tabs (Melatonin) .... One Tablet At Bedtime 11)  Miralax  Powd (Polyethylene Glycol 3350)  Allergies (verified): 1)  ! Toradol 2)  ! Macrodantin 3)  ! Codeine 4)  ! Pyridium  Past History:  Past Surgical History: Last updated: 08/29/2007 TOTAL KNEE REPLACEMENT, LEFT, HX OF (ICD-V43.65) BLADDER SUSPENSION, HX OF PROCEDURE (ICD-V45.89) FOOT SURGERY, HX OF BILATERAL (ICD-V15.89) INGUINAL HERNIORRHAPHY, RIGHT, HX OF (ICD-V45.89) CEREBRAL ANEURYSM REPAIR (ICD-437.3) HYSTERECTOMY, HX OF (ICD-V45.77) APPENDECTOMY, HX OF (ICD-V45.79) TONSILLECTOMY AND ADENOIDECTOMY, HX OF (ICD-V45.79) Rt shoulder surgery Rt knee arthroscopic  Family History: Last updated: 08/29/2007 Family History of Colon Cancer:FATHER,BROTHER Family History of Pancreatic Cancer:MOTHER Family History of Colon Polyps:SIBLINJGS, FATHER Family History of Heart Disease: FATHER, SISTERS  Social History: Last updated: 04/23/2010 Occupation: RETIRED Quit smoking in 11/10 Alcohol Use - yes-SOCIALLY Daily Caffeine Use: 1 CUP COFFEE Illicit Drug Use - no Patient does not get regular exercise.  Lives in [place] with daughter  Risk Factors: Exercise: no (08/29/2007)  Risk Factors: Smoking Status: current (08/29/2007)  Past Medical History: 1. LLE DVT - August 2010, recurrent hx.  Has IVC filter. Not on coumadin now with history of GI bleeding.  2. HYPERLIPIDEMIA (ICD-272.4) 3. GASTROESOPHAGEAL REFLUX DISEASE (ICD-530.81) 4. Hypertrophic cardiomyopathy: Echo (  4/09) with EF 70-75%, asymmetricy basal septal hypertrophy, mild MR without systolic anterior  motion of the mitral valve, LVOT gradient to 130 mmHg with Valsalva.  5. ANEMIA, CHRONIC (ICD-281.9) 6. OSTEOARTHRITIS (ICD-715.90): History of left TKR 7. FIBROMYALGIA (ICD-729.1) 8. Hx of CEREBRAL ANEURYSM (ICD-437.3) 9. Hx of MIGRAINE HEADACHE (ICD-346.90) 10. HYPERTENSION (ICD-401.9) 11. COPD 12. Left heart cath 2009 without significant CAD.  Myoview (2/11) with EF 72%, no ischemia.  13. PUD with GI bleeding  Family History: Reviewed history from 08/29/2007 and no changes required. Family History of Colon Cancer:FATHER,BROTHER Family History of Pancreatic Cancer:MOTHER Family History of Colon Polyps:SIBLINJGS, FATHER Family History of Heart Disease: FATHER, SISTERS  Social History: Occupation: RETIRED Quit smoking in 11/10 Alcohol Use - yes-SOCIALLY Daily Caffeine Use: 1 CUP COFFEE Illicit Drug Use - no Patient does not get regular exercise.  Lives in [place] with daughter  Review of Systems       All systems reviewed and negative except as per HPI.   Vital Signs:  Patient profile:   77 year old female Height:      63.5 inches Weight:      181 pounds BMI:     31.67 Pulse rate:   53 / minute BP sitting:   126 / 75  (left arm) Cuff size:   large  Vitals Entered By: Bishop Dublin, CMA (April 23, 2010 2:25 PM)  Physical Exam  General:  Well developed, well nourished, in no acute distress. Neck:  Neck supple, no JVD. No masses, thyromegaly or abnormal cervical nodes. Lungs:  Clear bilaterally to auscultation and percussion. Heart:  Non-displaced PMI, chest non-tender; regular rate and rhythm, S1, S2 without rubs or gallops. 1/6 systolic murmur left sternal border, ? worse with valsalva.  Carotid upstroke normal, no bruit.  Pedals normal pulses. No edema, no varicosities. Abdomen:  Bowel sounds positive; abdomen soft and non-tender without masses, organomegaly, or hernias noted. No hepatosplenomegaly. Extremities:  No clubbing or cyanosis. Neurologic:  Alert and  oriented x 3. Psych:  Normal affect.   Impression & Recommendations:  Problem # 1:  PREOPERATIVE EVALUATION Patient needs left rotator cuff repair under general anesthesia.  She has hypertrophic cardiomyopathy with significant LV outflow tract gradient on 2009 echo.  She is now on Toprol XL to try to lessen the gradient.  She has NYHA class II symptoms.  Her murmur is not particularly loud now.  I will get a repeat echo to reassess LV outflow tract gradient.  No other testing is needed prior to surgery, but precautions need to be taken in the setting of hypertrophic cardiomyopathy: patient should continue on Toprol XL peri-operatively, patient needs to be kept volume replete/avoid dehydration.   Problem # 2:  PULMONARY EMBOLISM, HX OF (ICD-V12.51) Patient has IVC filter in place.  No longer on coumadin given history of GI bleeding.   Other Orders: Echocardiogram (Echo)  Patient Instructions: 1)  Your physician recommends that you schedule a follow-up appointment in: 6 months 2)  Your physician recommends that you continue on your current medications as directed. Please refer to the Current Medication list given to you today. 3)  Your physician has requested that you have an echocardiogram.  Echocardiography is a painless test that uses sound waves to create images of your heart. It provides your doctor with information about the size and shape of your heart and how well your heart's chambers and valves are working.  This procedure takes approximately one hour. There are no restrictions for this  procedure.

## 2010-05-04 NOTE — Telephone Encounter (Signed)
Phone Note  Call from Patient Call back at [facility] Phone [phone]   Caller: Patient Summary of Call: echo results Initial call taken by: Judie Grieve,  April 30, 2010 4:05 PM  Follow-up for Phone Call         DaughterYvonna-Healthcare POA called-patient has pre-op visit tomorrow (Tuesday 3/20) at 9:15am, would like to know if results have been concluded for surgical clearance. She can be reached at [phone]. Follow-up by: Lenard Forth       Signed by Marca Ancona, MD on 05/04/2010 at 10:27 AM  ________________________________________________________________________ Mild hypertrophic cardiomyopathy changes only on echo.  Moderate diastolic dysfunction.  OK for surgery.  Careful with fluids perioperatively.    Signed by Marca Ancona, MD on 05/04/2010 at 10:27 AM  ________________________________________________________________________

## 2010-05-10 NOTE — Telephone Encounter (Signed)
Attempted to contact pt, LMOM TCB.  

## 2010-05-11 NOTE — Telephone Encounter (Signed)
Attempted to call pt, LMOM TCB, if pt calls back we can tell her pt was cleared for surgery as stated below, pt may have had sx by now.

## 2010-05-13 NOTE — Progress Notes (Signed)
Summary: Echo results   Phone Note Call from Patient Call back at Home Phone [phone]   Caller: Patient Summary of Call: echo results Initial call taken by: Judie Grieve,  April 30, 2010 4:05 PM  Follow-up for Phone Call        DaughterYvonna-Healthcare POA called-patient has pre-op visit tomorrow (Tuesday 3/20) at 9:15am, would like to know if results have been concluded for surgical clearance. She can be reached at [phone]. Follow-up by: Lenard Forth     Appended Document: Echo results Mild hypertrophic cardiomyopathy changes only on echo.  Moderate diastolic dysfunction.  OK for surgery.  Careful with fluids perioperatively.   Appended Document: Echo results Msg copied and being followed up in epic. [place]

## 2010-05-17 ENCOUNTER — Ambulatory Visit: Payer: Self-pay | Admitting: General Practice

## 2010-05-23 LAB — COMPREHENSIVE METABOLIC PANEL
ALT: 21 U/L (ref 0–35)
AST: 19 U/L (ref 0–37)
Calcium: 9.6 mg/dL (ref 8.4–10.5)
GFR calc Af Amer: >60 mL/min (ref >60)
Sodium: 138 mEq/L (ref 135–145)
Total Protein: 6.2 g/dL (ref 6.0–8.3)

## 2010-05-23 LAB — DIFFERENTIAL
Eosinophils Absolute: 0.1 10*3/uL (ref 0.0–0.7)
Eosinophils Relative: 1 % (ref 0–5)
Lymphs Abs: 1.9 10*3/uL (ref 0.7–4.0)
Monocytes Relative: 10 % (ref 3–12)
Neutrophils Relative %: 69 % (ref 43–77)

## 2010-05-23 LAB — URINALYSIS, MICROSCOPIC ONLY
Bilirubin Urine: NEGATIVE
Ketones, ur: NEGATIVE mg/dL
Nitrite: NEGATIVE
Protein, ur: NEGATIVE mg/dL
Specific Gravity, Urine: 1.039 — ABNORMAL HIGH (ref 1.005–1.030)
Urobilinogen, UA: 0.2 mg/dL (ref 0.0–1.0)

## 2010-05-23 LAB — SEDIMENTATION RATE: Sed Rate: 12 mm/hr (ref 0–22)

## 2010-05-23 LAB — CBC
MCHC: 34 g/dL (ref 30.0–36.0)
MCV: 94.1 fL (ref 78.0–100.0)
RBC: 3.75 MIL/uL — ABNORMAL LOW (ref 3.87–5.11)

## 2010-05-23 LAB — APTT: aPTT: 24 seconds (ref 24–37)

## 2010-05-23 LAB — PROTIME-INR
INR: 1 (ref 0.00–1.49)
Prothrombin Time: 13.5 seconds (ref 11.6–15.2)

## 2010-06-07 ENCOUNTER — Ambulatory Visit: Payer: Medicare Other | Attending: Orthopedic Surgery | Admitting: Physical Therapy

## 2010-06-07 DIAGNOSIS — M25519 Pain in unspecified shoulder: Secondary | ICD-10-CM | POA: Insufficient documentation

## 2010-06-07 DIAGNOSIS — IMO0001 Reserved for inherently not codable concepts without codable children: Secondary | ICD-10-CM | POA: Insufficient documentation

## 2010-06-07 DIAGNOSIS — M25619 Stiffness of unspecified shoulder, not elsewhere classified: Secondary | ICD-10-CM | POA: Insufficient documentation

## 2010-06-10 ENCOUNTER — Ambulatory Visit: Payer: Medicare Other | Admitting: Physical Therapy

## 2010-06-11 ENCOUNTER — Ambulatory Visit: Payer: Medicare Other | Admitting: Physical Therapy

## 2010-06-15 ENCOUNTER — Ambulatory Visit: Payer: Medicare Other | Attending: Orthopedic Surgery | Admitting: Physical Therapy

## 2010-06-15 DIAGNOSIS — IMO0001 Reserved for inherently not codable concepts without codable children: Secondary | ICD-10-CM | POA: Insufficient documentation

## 2010-06-15 DIAGNOSIS — M25519 Pain in unspecified shoulder: Secondary | ICD-10-CM | POA: Insufficient documentation

## 2010-06-15 DIAGNOSIS — M25619 Stiffness of unspecified shoulder, not elsewhere classified: Secondary | ICD-10-CM | POA: Insufficient documentation

## 2010-06-17 ENCOUNTER — Ambulatory Visit: Payer: Medicare Other | Admitting: Physical Therapy

## 2010-06-22 ENCOUNTER — Encounter: Payer: Medicare Other | Admitting: Physical Therapy

## 2010-06-24 ENCOUNTER — Encounter: Payer: Medicare Other | Admitting: Physical Therapy

## 2010-06-28 ENCOUNTER — Ambulatory Visit: Payer: Medicare Other | Admitting: Physical Therapy

## 2010-06-29 NOTE — Assessment & Plan Note (Signed)
[facility]                            [place] OFFICE NOTE   RICHIE, VADALA                          MRN:          [id number]  DATE:06/28/2007                            DOB:          Apr 12, 1932    CHIEF COMPLAINT:  Progressive shortness of breath and near syncope with  standing up too quickly.   HISTORY OF PRESENT ILLNESS:  Hailey [name] is a delightful 77 year old  white female whom I saw on consultation on June 04, 2007 to clear for  right knee arthroscopy.  This is scheduled for this Wednesday under  local anesthesia.   She has had progressive exertional weakness, shortness of breath and  also some sweats.  She has also had some near syncope with standing up  too quickly or walking too fast.  She denies any palpitations or true  syncope.   She had had coronary angiography in June of 2001.  This showed minimal  coronary disease with mildly elevated right-sided pressures and left  ventricular systolic function and EF of 65%.  At that time, there was no  mitral regurgitation.  She had been told by a local cardiologist that  she had had mitral valve prolapse for years.  I am not sure she has had  an echocardiogram in the past.   When I saw her, we performed a 2-D echo.  This shows hypertrophic  obstructive cardiomyopathy with a gradient of 130 mm with Valsalva.  Her  EF is 75%.  Her septum measured 14 mm, posterior wall 8 mm.  She had  mild mitral regurgitation with SAM.  Her left atrium was mildly dilated.  Her aortic valve was mildly calcified.  There was mild dilatation of the  descending aorta.  She had mildly elevated right-sided pressures.  She  is now being set up for a right and left catheterization with coronary  angiography for further treatment.   PAST MEDICAL HISTORY:   CURRENT MEDICATIONS:  1. Vasotec 10 mg a day.  2. Prilosec 20 mg a day.  3. Cymbalta 60 mg a day.  4. Neurontin 400 mg p.o. t.i.d.  5. Demadex 20 mg a  day.  6. Doxepin 50 mg day.  7. Meloxicam 15 mg a day.  8. Chantix 1 mg p.o. b.i.d.  9. Premarin 0.425 mg p.o. daily.  10.Reglan 5 mg p.o. b.i.d.  11.Lovastatin 40 mg a day.  12.Zyrtec 10 mg a day.   PAST HISTORY:  1. Hypertension.  2. Hyperlipidemia.  3. Depression.  4. Anxiety.  5. History of gout.  6. Gastroesophageal reflux.   ALLERGIES:  She has multiple allergies including:  1. TORADOL.  2. CODEINE.  3. MACRODANTIN.  4. PYRIDIUM.  (She has no dye allergy.)   She has no history of asthma.   SOCIAL HISTORY:  She is in the process of quitting smoking.  She smoked  for 30 years off and on.  She does not use any alcohol.  She  occasionally drinks a caffeinated beverage.  She is not able to exercise  at present, both with her shortness  of breath and her knee pain.   SURGICAL HISTORY:  1. Previous hysterectomy in 1970.  2. Shoulder repair in 1985.  3. Hernia repair in 1990.  4. Foot surgery x3.  5. Questionable brain surgery in 1998.  6. Total knee replacement in 2008.  7. Tonsillectomy.  8. Vein ligation x3.   REVIEW OF SYSTEMS:  Other than the HPI, she does list a history of some  allergies and hay fever.  She has had chronic anemia, constipation,  fatigue, history of gastroesophageal reflux, hiatal hernia, peptic  ulcer, menstrual dysfunction, urinary tract problems, arthritis, gout,  anxiety and depression.  Other review of systems questioned are  negative.   PHYSICAL EXAMINATION:  GENERAL:  She is very pleasant, in no acute  distress.  VITAL SIGNS:  Blood pressure is 147/82, her pulse is 97 and regular,  Her weight is 181.  She is 5 feet, 3-1/2 inches.  HEENT:  Normocephalic, atraumatic.  Pupils are equal, round and reactive  to light and accommodation.  Extraocular is intact.  Sclerae are clear.  Facial symmetry is normal.  NECK:  Supple.  No obvious lymphadenopathy.  Trachea is midline.  Carotids upstrokes were equal bilaterally without obvious bruits.   There  is no JVD.  Thyroid is not enlarged.  LUNGS:  Clear.  HEART:  A systolic murmur.  S2 was difficult to hear split.  No prolapse  is heard.  ABDOMEN:  Soft, good bowel sounds.  No midline bruit.  There is no  thyromegaly, no tenderness.  EXTREMITIES:  No edema.  Pulses are intact.  NEUROLOGIC:  Intact.  MUSCULOSKELETAL:  Chronic arthritic changes, particularly in her hands.   ELECTROCARDIOGRAM:  Her electrocardiogram shows some nonspecific ST-  segment changes.   ASSESSMENT:  1. Hypertrophic obstructive cardiomyopathy.  This could explain all of      her symptoms which are quite worrisome with significant dyspnea on      exertion that is very limiting to her, not to mention presyncope      with change of position.  2. History of minimal coronary artery disease by cath in 2001.   PLAN:  Outpatient cardiac catheterization, right, left and CORS.  I am  going to arrange for this hopefully early next week.  She would like to  go ahead and have her knee arthroscopy, which I think is reasonable as  long as she only receives conscious sedation and is not put to sleep.   I am going to start her on metoprolol 25 mg p.o. b.i.d. which will  obviously need to be up titrated.  She has been informed to take that on  the day of her arthroscopy.   Indications, risks and potential benefits of the cath have been  discussed, and she agrees to proceed.     Thomas C. Daleen Squibb, MD,  [facility]  Electronically Signed    TCW/MedQ  DD: 06/28/2007  DT: 06/28/2007  Job #: [id number]   cc:   Jonny Ruiz L. Rendall, M.D.  Rosalyn Gess Norins, MD

## 2010-06-29 NOTE — Cardiovascular Report (Signed)
NAMEROMONDA, PARKER                   ACCOUNT NO.:  [id number]   MEDICAL RECORD NO.:  [id number]          PATIENT TYPE:  OIB   LOCATION:  1961                         FACILITY:  [facility]   PHYSICIAN:  Bevelyn Buckles. Bensimhon, MDDATE OF BIRTH:  01/20/1933   DATE OF PROCEDURE:  07/06/2007  DATE OF DISCHARGE:  07/06/2007                            CARDIAC CATHETERIZATION   PRIMARY CARDIOLOGIST:  Maisie Fus C. Wall, MD, [facility]   PATIENT IDENTIFICATION:  Ms. Basquez is a 77 year old woman who has a  history of normal coronary arteries by coronary angiography in June  2001.  She had presented to Dr. Daleen Squibb complaining of exertional shortness  of breath, chest pain, and near syncope.  She had an echocardiogram  which showed evidence of HOCM with an EF of 75% and LVOT gradient of 130  mm with Valsalva.  She is referred for right and left heart  catheterization in the outpatient lab.   PROCEDURES PERFORMED:  1. Selective coronary angiography.  2. Left heart catheterization.  3. Left ventriculogram.  4. Aortic root angiogram.   DESCRIPTION OF PROCEDURE:  The risks and indication of procedure were  explained.  Consent was signed and placed in the chart.  A 4-French  arterial sheath was placed in the right femoral artery using a modified  Seldinger technique.  Standard catheters were used including JL-4, 3DRC  and angled pigtail.  Of note, the patient did experience severe chest  pain on initial injection of the left coronary system.  This persisted  throughout the left side injections.  She did not having any significant  pain on the right side.  After we had shot the right coronary artery, we  then came back and looked at the left to make sure there was no evidence  of vessel damage.  She did not have any pain with re-injection of the  left coronary system.  There was no evidence of dissection.  A 7-French  venous sheath was placed in right femoral vein.  A standard right heart  catheterization was performed  with a Swan-Ganz catheter.   FINDINGS:  Atrial pressure mean of 9, RV 45/10, PA 35/13 with a mean of  22, wedge pressure mean of 22.  Central aortic pressure 138/60 with mean  of 92.  LV pressure 143/70 with EDP of 25.  Fick cardiac outputs 4.1  with a index of 2.2.   1. Left main was short, angiographically normal.  2. Left circumflex was made up essentially of a large branching first      marginal.  There was a 20% lesion in the proximal portion.  3. LAD was a long vessel wrapping the apex.  It gave off three small      to medium diagonals.  In the mid distal LAD, there was evidence of      section of myocardial bridging.  There was mild-to-moderate diffuse      disease in the distal LAD about 30% to 40% but no high-grade      lesions.  4. Right coronary was a large dominant vessel and had a 20%  tubular      lesion in the midsection.   Left ventriculogram showed hyperdynamic LV with an EF of 70% to 75%.  He  had a morphology of HOCM.  However, we were not able to elicit gradient  across the LV outflow track on flow back at rest.  We did not Valsalva.   Aortic root injection showed a normal aortic root with no evidence of  dissection.   ASSESSMENT:  1. Mild nonobstructive coronary artery disease as described above.  2. Hyperdynamic left ventricle consistent with hypertrophic      obstructive cardiomyopathy.  3. Mildly increased left ventricular pressures.   PLAN:  Plan will be to continue medical therapy for her HOCM as per Dr.  Daleen Squibb, blockers.      Bevelyn Buckles. Bensimhon, MD  Electronically Signed     DRB/MEDQ  D:  07/06/2007  T:  07/07/2007  Job:  [id number]

## 2010-06-29 NOTE — Assessment & Plan Note (Signed)
[facility] OFFICE NOTE   KALAH, PFLUM                          MRN:          [id number]  DATE:05/07/2008                            DOB:          1932/06/04    PRIMARY CARE PHYSICIAN:  Rosalyn Gess. Norins, MD   PRIMARY CARDIOLOGIST:  Jesse Sans. Wall, MD, FACC   HISTORY OF PRESENT ILLNESS:  This is a 77 year old with a history of  hypertrophic obstructive cardiomyopathy, hypertension, and knee  osteoarthritis who presents to Cardiology Clinic for evaluation prior to  surgical meniscectomy by Dr. Priscille Kluver.  The patient's symptoms have been  stable recently.  She does continue to get shortness of breath with  exertion when she climbs a flight of steps or after she walks about 50  yards on flat ground.  She can walk around in her house without any  trouble.  When she exerts herself, she additionally gets episodes of  diaphoresis.  The patient has had these symptoms stably for over a year  now.  She has no chest pain episodes.  She does have a history of  hypertrophic obstructive cardiomyopathy with asymmetric septal  hypertrophy on echocardiogram with a peak LV outflow tract gradient of  130 achieved after Valsalva maneuver.  She did go for left and right  heart catheterization after this.  Her left heart catheterization showed  minimal luminal irregularities.  The left ventricle had the morphology  of hypertrophic obstructive cardiomyopathy; however, they were unable to  elicit a significant outflow tract gradient and did not do a Valsalva  maneuver at the time of cath.  The patient does need arthroscopic  lateral meniscectomy.  She has not set this up yet, but would like to do  this in the near future.   PAST MEDICAL HISTORY:  1. Hypertrophic obstructive cardiomyopathy.  The patient had an echo      in April 2009 with hyperdynamic LVEF of 70-75%.  There was      asymmetric septal hypertrophy.  There was caudal  systolic anterior      motion.  There was mild mitral regurgitation.  There was dynamic LV      outflow tract obstruction with a peak gradient read as 130 mmHg      with Valsalva.  She then had an followup left and right heart      catheterization that showed minimal coronary artery disease, just      luminal irregularities.  We were unable to elicit an significant LV      outflow tract gradient on pullback; however, Valsalva or other      provocative maneuvers were not done.  The patient is now being      treated with beta-blockers.  2. Chronic vertigo.  3. Osteoarthritis.  4. Hypertension.  5. Gastroesophageal reflux disease.  6. Hyperlipidemia.  7. Smoking.   MEDICATIONS:  1. Cymbalta.  2. Neurontin.  3. Doxepin.  4. Meloxicam.  5. Premarin.  6. Reglan.  7. Torsemide 20 mg daily.  8. Lovastatin 40 mg daily.  9. Lopressor 25 mg  b.i.d.  10.Prilosec 20 mg daily.  11.Enalapril 10 mg daily.   SOCIAL HISTORY:  The patient is now smoking about 3 cigarettes a day.   There has been no recent labs.   PHYSICAL EXAMINATION:  VITAL SIGNS:  Blood pressure is 160/82, heart  rate is 69 and regular.  GENERAL:  This is a well-developed elderly female in no apparent  distress.  NEUROLOGIC:  Alert and oriented x3.  Normal affect.  NECK:  There is no JVD.  There is no thyromegaly or thyroid nodule.  LUNGS:  Clear to auscultation bilaterally with normal respiratory  effort.  CARDIOVASCULAR:  Heart regular, S1 and S2.  No S3.  No S4.  There is a  2/6 early peaking systolic murmur at the right upper sternal border.  The murmur does increase, but only very slightly with Valsalva.  There  is 1+ edema at the ankles.  There are 2+ posterior tibial pulses  bilaterally.  There is no carotid bruit.  LUNGS:  Clear to auscultation bilaterally.  Normal respiratory effort.  EXTREMITIES:  No clubbing or cyanosis.  ABDOMEN:  Soft, nontender.  No hepatosplenomegaly.   ASSESSMENT AND PLAN:  This is a  77 year old with a history of  hypertrophic obstructive cardiomyopathy who presents to Cardiology  Clinic for evaluation prior to arthroscopic meniscectomy.  The patient's  symptoms are stable.  She has had the same degree of dyspnea on exertion  for over a year.  Her symptoms are probably New York Heart Association  class 3.  She does carry the diagnosis of hypertrophic obstructive  cardiomyopathy.  She did have an inducible gradient with Valsalva on  echocardiogram.  We were unable to elicit a gradient on left heart  catheterization; however, provocative maneuvers were not done.  The  patient is planned for surgery, this is a low-risk surgery; however, the  patient is a high-risk patient given her presumed dynamic left  ventricular outflow tract obstruction.  I would suggest that we increase  her beta blockade as this can help decrease the left ventricular outflow  tract gradient.  Instead of Lopressor 25 mg twice a day, I will have her  on Toprol-XL 75 mg once a day.  She will come back in a week for blood  pressure and pulse check.  As long as she tolerates that, I would say,  we should continue her on this dose long term.  At the time of surgery,  great care needs to be taken to avoid dehydration or significant fluid  shifts as dehydration or blood loss can lead to decrease in the size of  the left ventricular cavity increasing left ventricular outflow tract  obstruction and potential for syncope or cardiac arrest.  Therefore  during surgery, fluid management will be very important.  She should be  continued on Toprol-XL.  She should receive Toprol XL on the day of  surgery, as this can help decrease her left ventricular outflow tract  gradient.  Besides these steps, there are no other definite measures  that need to be taken prior to surgery.  I do think that with these  steps, she should be able to undergo surgery safely especially a low-  risk surgery like an arthroscopic  meniscectomy.  I do think that there  is no further cardiac evaluation that needs to be done and as far as  preoperative clearance as mentioned, I do think it is reasonable to go  ahead with surgery taking the steps of care with  fluid management and  Toprol-XL 75 mg daily.     Marca Ancona, MD  Electronically Signed    DM/MedQ  DD: 05/07/2008  DT: 05/08/2008  Job #: [id number]   cc:   Rosalyn Gess. Norins, MD  Carlisle Beers. Rendall, M.D.

## 2010-06-29 NOTE — Assessment & Plan Note (Signed)
[facility] OFFICE NOTE   Hailey [name], Hailey [name]                          MRN:          [id number]  DATE:06/04/2007                            DOB:          December 04, 1932    I was asked by Dr. Bard Herbert to clear Wilber Oliphant for a right knee  arthroscopy.   HISTORY OF PRESENT ILLNESS:  She is 77 year old, white female who has a  history of trivial coronary disease by heart catheterization on July 22, 1999.  Her symptoms at that time were some chest discomfort, progressive  exertional weakness, diaphoresis with fairly worrisome substernal chest  discomfort radiating to the neck, jaw and upper extremity.  Because her  cardiac risk factors, she underwent coronary angiography on July 22, 1999.  This showed minimal coronary disease, mildly elevated right-sided  pressures and left ventricular systolic function that was well-preserved  with EF of 65%.  There was no mitral regurgitation.   She was told years ago by Dr. Juel Burrow, that she had mitral valve  prolapse.  Apparently, he performed what sounds like an echocardiogram.   She is limited in her mobility at the present time.  However, she denies  any true angina or any change in her symptoms as opposed to 2001.  She  has a lot of chronic pain and has a history of fibromyalgia.   ALLERGIES:  TORADOL, MACRODANTIN, PYRIDIUM AND CODEINE.   SOCIAL HISTORY:  She is currently in the process of quitting smoking.  She is taking Chantix.  She smoked x30 years off and on.  She does not  use any recreational products.  She does not drink alcohol.  She  occasionally will drink a caffeinated beverage.  She is unable exercise  on a regular basis.   PAST SURGICAL HISTORY:  1. Hysterectomy 1970.  2. Shoulder repair in 1985.  3. Hernia repair in 1990.  4. Foot surgery x3.  5. Questionable brain surgery in 1998.  6. Total knee replacement 2008, which she had no problems with.  7.  Tonsillectomy as a child.  8. Vein ligation x3.   CURRENT MEDICATIONS:  1. Vasotec 10 mg a day.  2. Prilosec 20 mg a day.  3. Cymbalta 60 mg a day.  4. Neurontin 400 mg p.o. t.i.d.  5. Demadex 20 mg a day.  6. Doxepin 50 mg a day.  7. Meloxicam 15 mg a day.  8. Chantix 1 mg b.i.d.  9. Premarin 0.425 mg per day.  10.Reglan 5 mg b.i.d.  11.Lovastatin 40 mg a day.  12.Zyrtec 10 mg a day.   PAST MEDICAL HISTORY:  1. Hypertension.  2. Hyperlipidemia.  3. Depression.  4. Anxiety.  5. History of gout.  6. Gastroesophageal reflux.   REVIEW OF SYSTEMS:  Almost all positive.  Specifically, she has a  history of allergies and hay fever.  She notes a history of chronic  anemia, constipation, fatigue, history of an ulcer, hiatal hernia,  gastroesophageal reflux, menstrual dysfunction, urinary tract problems,  arthritis, gout, anxiety and depression.  Other than  HPI, otherwise all  systems questioned.   FAMILY HISTORY:  Noncontributory except for her sister who had a massive  heart attack at age 71, though this is above 31.   PHYSICAL EXAMINATION:  GENERAL:  She is very pleasant, in no acute  distress.  Skin is warm and dry.  Alert and oriented x3.  VITAL SIGNS:  Blood pressure 122/68, pulse 73 and regular.  She is 5  feet 3-1/2 inches.  Weight 182 pounds.  HEENT:  Normocephalic, atraumatic.  PERRL.  Extraocular is intact.  Sclerae are clear.  Facial symmetry is normal.  Carotid upstrokes are  equal bilaterally without bruits, no JVD.  Thyroid is not enlarged.  Trachea is midline.  LUNGS:  Clear to auscultation.  HEART:  A systolic murmur consistent mostly with aortic stenosis.  S2  seems to split, but is hard to tell.  I do not hear a click.  I do not  hear any prolapse per se.  ABDOMEN:  Soft, good bowel sounds.  No midline bruit.  No hepatomegaly.  No tenderness.  EXTREMITIES:  No edema.  Pulses are intact.  NEUROLOGIC:  Exam is intact.  MUSCULOSKELETAL:  A lot of chronic  arthritic changes in her hands  particularly.   Her electrocardiogram shows some nonspecific ST-segment changes  otherwise unremarkable.  I do not have an old one to compare.   ASSESSMENT:  1. Chronic chest pain with dyspnea on exertion with trivial coronary      disease by catheterization in 2001.  I do not think she is      currently having any symptomatic angina or ischemia.  2. Murmur consistent mostly with aortic sclerosis or mild stenosis.   PLAN:  1. A 2-D echocardiogram to assess her aortic valve and also there is      question of mitral valve prolapse, although this does not      classically sound like prolapse.  2. If her echocardiogram is stable, will clear her for surgery.  If      she has some mild aortic stenosis, she will need annual followup.     Thomas C. Daleen Squibb, MD, [facility]  Electronically Signed    TCW/MedQ  DD: 06/04/2007  DT: 06/04/2007  Job #: [id number]   cc:   Rosalyn Gess. Norins, MD  Carlisle Beers. Rendall, M.D.

## 2010-06-29 NOTE — Discharge Summary (Signed)
Hailey [name], Hailey [name]                   ACCOUNT NO.:  [id number]   MEDICAL RECORD NO.:  [id number]          PATIENT TYPE:  INP   LOCATION:  1305                         FACILITY:  [facility]   PHYSICIAN:  Rachael Fee, MD   DATE OF BIRTH:  05/07/1932   DATE OF ADMISSION:  09/02/2008  DATE OF DISCHARGE:  09/05/2008                               DISCHARGE SUMMARY   DISPOSITION:  To home in stable condition.   DISCHARGE MEDICATIONS:  Continue home medications.  1. __________.  2. Doxepin 50 mg at bedtime.  3. Cymbalta 60 mg daily.  4. Neurontin 400 mg t.i.d.  5. Zocor 20 mg daily.  6. Hydrocodone every 6 hours as needed.  7. Vasotec 10 mg daily.  8. Metoprolol 50 mg XR one to one-half tablets daily.  9. Prilosec OTC 20 mg b.i.d.   CONSULTATIONS:  None requested.   PROCEDURES:  None performed this admission.   ADMITTING DIAGNOSES:  1. Nausea, anorexia.  Initially started with vomiting but that      resolved several days ago.  2. Subjective fevers at home.  3. Questionable diverticulitis, started on Cipro, August 27, 2008, by      primary care physician.  4. Fibromyalgia.  5. Gastroesophageal reflux disease, on b.i.d. PPI.  6. Diverticulosis.  7. Hypertrophic obstructive cardiomyopathy.  8. History of pulmonary embolus.  9. Hyperlipidemia, treated.   HOSPITAL COURSE:  Ms. Dutko was admitted from our office after being seen  on September 02, 2008, for nausea, weakness, and fatigue.  The patient is  known to Dr. Marina Goodell for GERD, colon polyps, and diverticulosis.  On the  day of admission, the patient described onset of illness about 10 days  ago.  It started with nausea and vomiting but since that time the  vomiting had resolved.  The patient complained of some vague upper  abdominal discomfort, which she later described to me as a pulling  sensation.  In the office, the patient reported subjective fever and  feelings of being hot and cold as well as periods of diaphoresis.  The  patient  was admitted for further evaluation and treatment.  On  admission, the patient's white count was 9.4, hemoglobin 12, hematocrit  35.3, and platelets 357.  Her INR was normal.  CMET was unremarkable.  Lipase normal at 25.  Urinalysis was normal.  Only unremarkable lab was  a D-dimer elevated at 2.79.  The patient had no respiratory distress.  No chest pain.  Her CT of the abdomen and pelvis did not show any acute  abnormalities.  Fatty liver disease was seen.  There was nodular  scarring at the right lung base and several renal cystic lesions were  seen as well.  Throughout her short hospital stay, the patient did not  have any vomiting and he really did not have any nausea.  The problem  was more of a poor appetite.  She was only eating half or less of meals,  because if she ate more, the patient would be concerned that nausea will  return.  No definite early  satiety.  No definite abdominal pain.  A  significant amount of time was spent with the patient and her daughter,  and it was discovered that the patient takes a fair amount of Dramamine  at home for a crazy head.  She does describe frequent dizziness and  possible loss of balance at times.  She had a known history of cerebral  aneurysm.  A CT of the head with contrast was obtained.  There were no  acute intracranial abnormalities.  There was atrophy and chronic  ischemic white matter disease.  There was never a clear-cut reason for  the patient's anorexia.  She denied depression or anxiety.  Ensure  nutritional supplements were provided and the patient did sip on those  throughout the day.  She was encouraged ambulate in the halls.  On the  day of discharge, September 05, 2008, the patient was for some reason feeling  much better.  She was discharged home with the above medications and a  followup appointment arranged with her primary gastroenterologist, Dr.  Marina Goodell.  I did speak with the patient about possibly seeing a neurologist  as an  outpatient for what sounds like vertigo.   DISCHARGE DIAGNOSES:  1. Nausea associated with anorexia.  By the time of discharge, nausea      had resolved but the patient's appetite was not back to baseline.      Etiology was never determined, but the patient was showing      improvement by the time of discharge.  CT of the abdomen and pelvis      with contrast as well as a CT of the head with contrast was      negative.  2. Gastroesophageal reflux disease, stable on a b.i.d. PPI.  3. Fibromyalgia.  4. Diverticulosis.  5. Hypertrophic obstructive cardiomyopathy.  6. History of pulmonary embolism.  D-dimer was elevated this      admission.  The patient had no respiratory distress.  No chest      pain.  7. Hyperlipidemia, treated.      Willette Cluster, NP      Rachael Fee, MD  Electronically Signed    PG/MEDQ  D:  09/05/2008  T:  09/06/2008  Job:  [id number]

## 2010-06-29 NOTE — Assessment & Plan Note (Signed)
[facility] OFFICE NOTE   Hailey, [name]                          MRN:          [id number]  DATE:08/28/2007                            DOB:          [birth year]    Hailey [name] returns today for further management of her hypertrophic  cardiomyopathy.   On the last visit, she was having some orthostatic symptoms and we  dropped her Vasotec.  Her blood pressure at that time was 117/66.  We  had increased her metoprolol earlier to 25 b.i.d. to slow her heart rate  down with her hypertrophic cardiomyopathy.   She had to start her Vasotec back at 10 mg a day because her pressures  went up to 160.  She is still having some orthostatic symptoms.   She has also been having some right upper quadrant pain over the weekend  and had some significant discomfort after her last meal.  She denies any  fever, chills, nausea, vomiting, or diarrhea.  She is followed by Dr.  Yancey Flemings in GI for chronic abdominal pain.   MEDICATIONS:  Otherwise are unchanged.   PHYSICAL EXAMINATION:  VITAL SIGNS:  Her blood pressure is 126/70 and  pulse is 68 and regular, which is the best it has been.  Her weight is  182, which is stable.  GENERAL:  She is in no acute distress.  She appears nontoxic.  HEENT:  Unchanged.  Sclerae are nonicteric.  NECK:  Carotid upstrokes were equal bilaterally without bruits.  No JVD.  Thyroid is not enlarged.  Trachea is midline.  LUNGS:  Clear.  HEART:  Regular rate and rhythm.  No gallop.  ABDOMINAL:  Soft and good bowel sounds.  She does have some tenderness  with inspiration in the right upper quadrant, but not a dramatic Murphy  sign.  EXTREMITIES:  No edema.  Pulses were intact.   I had a long talk with Ms. Difabio today.  We have gone over orthostatic  precautions and told her to stay well hydrated.  She could take her  Demadex p.r.n., which is currently 20 mg a day.   I have also called Dr. Yancey Flemings,  spoken with him directly.  They will  see her this week for potential cholecystitis.   We will see her back in about 6 weeks.     Thomas C. Daleen Squibb, MD, [facility]  Electronically Signed    TCW/MedQ  DD: 08/28/2007  DT: 08/29/2007  Job #: [id number]

## 2010-06-29 NOTE — Assessment & Plan Note (Signed)
[facility]                         GASTROENTEROLOGY OFFICE NOTE   Hailey [name], Hailey [name]                          MRN:          [id number]  DATE:12/15/2006                            DOB:          1932-06-05    The patient is self-referred.   REASON FOR EVALUATION:  Abdominal complaints.   HISTORY:  This is a 77 year old female with a history of  gastroesophageal reflux disease, functional abdominal pain, adenomatous  colon polyps, and chronic anemia (multifactorial).  She presents today  for recurrence of some chronic GI complaints.  Currently she reports  problems with nausea as well as sensation that food does not digest well  or sits on her stomach.  No true dysphagia.  She also states that she  has noticed some dark stools this week and previously.  She has  alternating bowel habits.  She tells me that she was noted to be anemic  on recent blood counts.  Review of the computer database shows a  hemoglobin of 10.7 on November 01, 2006.  Other blood counts were  normal as was her comprehensive metabolic panel.  Thyroid study was also  unremarkable.  Her last colonoscopy was performed in May of 2006.  This  revealed diverticulosis and a diminutive polyp.  Follow-up in three  years recommended.  Upper endoscopy at that same time was normal except  for a small sliding hiatal hernia. For her reflux disease, she has been  on Prilosec OTC 20 mg daily.  She has been having some breakthrough  symptoms and states that increasing the dose to twice daily seems  helpful.  She is also on Reglan 5 mg t.i.d.   PAST MEDICAL HISTORY:  As above.   ALLERGIES:  TORADOL, MACRODANTIN, PYRIDIUM, CODEINE.   CURRENT MEDICATIONS:  1. Vasotec 10 mg daily.  2. Neurontin 400 mg t.i.d.  3. Doxepin 50 mg at night.  4. Premarin 0.9 mg daily.  5. Reglan 5 mg t.i.d. before meals.  6. Prilosec OTC 20 mg daily.  7. Demadex 20 mg daily.  8. Zyrtec 10 mg daily.  9. Lovastatin  40 mg daily.  10.Meloxicam 15 mg daily.   PHYSICAL EXAMINATION:  GENERAL:  A well-appearing female in no acute  distress.  VITAL SIGNS:  Blood pressure 130/60, heart rate 80, weight 181.6 pounds.  HEENT:  Sclerae anicteric, conjunctivae pink, oral mucosa is intact.  No  adenopathy.  LUNGS:  Clear.  HEART:  Regular.  ABDOMEN:  Soft with complaints of some tenderness to palpation in the  epigastric area.  Good bowel sounds are heard.  No mass felt.  RECTAL:  No external abnormality.  The stool is brown, soft, hemoccult  negative.  EXTREMITIES:  Without edema.   IMPRESSION:  1. Problems with epigastric discomfort and nausea possibly due to      reflux disease, possibly functional.  2. History of reflux disease.  Some breakthrough symptoms on once      daily Prilosec OTC.  3. Question of melena per patient report.  4. Hemoccult negative stool on physical examination today.  5. Chronic anemia,  ongoing.   RECOMMENDATIONS:  1. Increase Prilosec OTC to b.i.d.  Multiple samples have been      provided.  2. Schedule upper endoscopy to evaluate nausea, pain, refractory      reflux, and questionable melena.  The nature of the procedure as      well as risks, benefits, and alternatives have been reviewed.  She      understood and agreed to proceed.  3. Surveillance colonoscopy due next year.  4. Ongoing general medical care with Dr. Debby Bud.     Wilhemina Bonito. Marina Goodell, MD  Electronically Signed    JNP/MedQ  DD: 12/15/2006  DT: 12/16/2006  Job #: [id number]   cc:   Rosalyn Gess. Norins, MD  Rose Phi. Myna Hidalgo, M.D.

## 2010-06-29 NOTE — Assessment & Plan Note (Signed)
[facility] OFFICE NOTE   Hailey [name], Hailey [name]                          MRN:          [id number]  DATE:10/16/2007                            DOB:          Feb 26, 1932    Hailey [name] returns today for further management of hypertrophic  cardiomyopathy.  She is still having some occasional orthostatic  symptoms.  The biggest problem has been chronic vertigo for which she  takes meclizine with sort of a so-so response.  She actually was so  dizzy the other day going to her house with a bag of grocery, she fell.  She did not break anything.   I had Dr. Marina Goodell see her after my last visit on August 28, 2007, for  possible cholecystitis.  Tells that she did not have this thank  goodness.   MEDICATIONS:  Her meds are otherwise unchanged.  She takes Demadex about  twice a week 20 mg.   PHYSICAL EXAMINATION:  VITAL SIGNS:  Her blood pressure today is 143/76,  her pulse 78 and regular, weight is stable 183, respiratory rate is 18.  GENERAL:  She is in no acute distress.  LUNGS:  Clear.  HEART:  A systolic murmur along the left sternal border.  There is no  gallop.  ABDOMEN:  Soft.  EXTREMITIES:  Very little edema.  NEURO:  Intact.   Hailey [name] is stable from our standpoint.  Other than some occasional  symptoms from her HOCM, she is doing well.  I will plan on seeing her  back in 6 months.     Thomas C. Daleen Squibb, MD, [facility]  Electronically Signed    TCW/MedQ  DD: 10/16/2007  DT: 10/17/2007  Job #: [id number]

## 2010-06-29 NOTE — Assessment & Plan Note (Signed)
[facility] OFFICE NOTE   Hailey [name], Hailey [name]                          MRN:          [id number]  DATE:07/27/2007                            DOB:          1933-01-31    Ms. Durrett comes in today after her catheterization for hypertrophic  cardiomyopathy and ruling out significant coronary disease.   Fortunately, she had no significant obstructive coronary artery disease.  Her right-sided pressures were moderately increased with an RV of 41/12.  Pulmonary capillary wedge pressure was also elevated at 22.  Surprised,  she had no significant gradient and had good outputs.  She has  hyperdynamic LV function, EF of 70-75%.   We placed her on metoprolol 25 b.i.d. for her cardiomyopathy.  We are  hoping to slow her heart rate down.  She does not feel any better.  I  suspect her dyspnea is not going to improve dramatically as I told her  today.  She is still getting significantly lightheaded when she stands  up and gets a headache.  Her blood pressures have been running as low as  90-100 at home.   Her pressure today is 117/66, pulse is 80 and regular, her weight is  184, and the rest of her exam is unchanged.   I had a long talk with Ms. Gutridge today.  I am going to stop her Vasotec  and have her check her pressures at home.  If she begins to run 140 or  greater at home, we will have to put her back on maybe 5 mg.  If she  does get lower extremity dependent edema, we will keep her on Demadex at  20 a day.  She will continue with her metoprolol 25 b.i.d.  She does not  need antibiotic prophylaxis for SBE, which we discussed today.   I will see her back again in about 6 weeks.     Thomas C. Daleen Squibb, MD, [facility]  Electronically Signed    TCW/MedQ  DD: 07/27/2007  DT: 07/28/2007  Job #: [id number]   cc:   Rosalyn Gess. Norins, MD

## 2010-07-01 ENCOUNTER — Encounter: Payer: Medicare Other | Admitting: Physical Therapy

## 2010-07-02 NOTE — Assessment & Plan Note (Signed)
[name]                           PRIMARY CARE OFFICE NOTE   LANAYA, BENNIS                          MRN:          [id number]  DATE:01/16/2006                            DOB:          1932-02-24    Mrs. Bran called today and left a message that she is having dysuria with  frequency, small volumes and the discomfort has been relieved with Azo  (phenazopyridine) over-the-counter.  The patient is reliable and has had  these problems in the past.   DIAGNOSIS:  Probable cystitis.   PLAN:  Cipro 250 mg b.i.d. for five days.  Prescription called.     Rosalyn Gess Norins, MD  Electronically Signed    MEN/MedQ  DD: 01/16/2006  DT: 01/17/2006  Job #: [id number]

## 2010-07-02 NOTE — Letter (Signed)
February 23, 2006    John L. Rendall, M.D.  [address] [place], [place] [address]   RE:  Hailey [name]  MRN:  [id number]  /  DOB:  August 20, 1932   Dear Jonny Ruiz:   Thank you very much for asking me to see Hailey [name] in reference to  clearance for surgery.  It is my understanding she is a candidate for a  total left knee replacement secondary to end-stage arthritic joint  disease.   Hailey [name] had a full surgical clearance note dictated Jul 06, 2004, which  I believe you would have on record.  In the interval since that visit  the patient has been seen on multiple occasions.  She has been seen for  pain in her hip, she has been seen for arthritic pain and discomfort in  general.  She has had routine health care as well including a mammogram  last performed several months and she is being followed by Dr. Evette Cristal  for this because of a lump that appears to be stable and nonmalignant in  her left breast.   Also, in the interval patient has undergone CT scan of the chest  performed October 28, 2004, which was negative for any acute process.  She also had abdominal CT scan at that same setting which showed no  acute abnormality within the abdomen.  She did have a ventral hernia  that was thought to be stable.  Pelvic CT revealed the patient to be  status post hysterectomy with no abnormalities otherwise noted.   The patient's current medications include:  1. Vasotec 10 mg daily.  2. Premarin 0.125 mg daily.  3. Reglan 5 mg q.i.d.  4. Demadex 20 mg daily.  5. Prilosec 20 mg q.a.m.  6. Cymbalta 60 mg daily.  7. Lipitor 20 mg daily.  8. Chantix 1 mg b.i.d.  9. Doxepin 50 mg q.h.s.  10.Levsin 0.125 mg p.r.n.  11.Ativan 1-2 mg q.h.s. p.r.n.   The patient's vital signs have been stable and on today's visit her  blood pressure is 115/59.  Her physical exam was not repeated.   I believe the patient is a good candidate for general anesthesia and  surgery and is medically cleared.  Of note,  the patient is working on  smoking cessation effort.  She continues with Chantix.  She has  contacted the 1-800-QUITNOW number and she has reduced her cigarette use  to just 2 or 3 cigarettes per day.  She understands the importance of  being completely abstemious from nicotine, at least several weeks prior  to her surgery, to minimize the complications from a pulmonary  perspective as well as wound healing perspective related to nicotine.   I would be happy to see Hailey [name] during her hospitalization at the time  of her surgery, if I am notified of her date of admission.   If I can provide any additional information, please do not hesitate to  contact me.   I remain yours truly.    Sincerely,      Hailey Gess. Norins, MD  Electronically Signed    MEN/MedQ  DD: 02/23/2006  DT: 02/23/2006  Job #: [id number]   CC:   John L. Rendall, M.D.  Wilber Oliphant

## 2010-07-02 NOTE — Op Note (Signed)
NAMEVYLETTE, Hailey [name]                   ACCOUNT NO.:  [id number]   MEDICAL RECORD NO.:  [id number]          PATIENT TYPE:  INP   LOCATION:  X007                         FACILITY:  [facility]   PHYSICIAN:  John L. Rendall, M.D.  DATE OF BIRTH:  1932/05/07   DATE OF PROCEDURE:  05/08/2006  DATE OF DISCHARGE:                               OPERATIVE REPORT   INDICATION AND JUSTIFICATION FOR PROCEDURE:  Chronic left knee pain  despite conservative measures with bone-on-bone in the lateral  compartment unremitting pain with activity.  Justification for inpatient  stay:  Need for pain control and ambulation.   PREOPERATIVE DIAGNOSIS:  End-stage osteoarthritis left knee.   SURGICAL PROCEDURES:  Left LCS total knee replacement with computer  navigation assistance.   POSTOPERATIVE DIAGNOSIS:  Left LCS total knee replacement with computer  navigation assistance.   SURGEON:  John L. Rendall, M.D.   ASSISTANT:  Richardean Canal, P.A.-C   ANESTHESIA:  General with femoral nerve block.   DESCRIPTION OF PROCEDURE:  Under general anesthesia the left leg was  prepared with DuraPrep and draped as a sterile field with a leg holder.  Standard midline incision is made going medial parapatellar deep, after  elevating a sterile tourniquet at 350 mm.  The femur is sized at a  standard to a standard plus.  Debridement is done in preparation for  computer mapping.  Schanz pins were then placed through extra punctures,  in the superior medial tibia and distal medial femur.  The arrays are  set up, and the femoral head is found.  The medial and lateral malleolus  are found.  The proximal tibia and distal femur are mapped.  With the  use of the computer then, the proximal tibia cut is made within 1 degree  of anatomical alignment.  Using the tensioner and the computer, tension  of the flexion and extension gaps were done and the anatomic alignment  was within 1 degree straight in extension.  The femur was  originally  sized at standard plus, according to the computer, and the computer is  used for cuts, anterior, posterior, distal, and the recessing guide is  then used.   Remnants of the meniscus and cruciates are resected.  Spurs were taken  off the back of the femoral condyles.  The proximal tibia is exposed,  sized to a #3, a center PEG-hole with keel is inserted.  A trial of 12.5  bearing, and standard plus femur reveals the femoral components too  large; and it overhangs both condyles.  The femoral component is then  downsized to a standard using the computer to cut anterior-and-posterior  flare of the femur and the recessing guide is then downsized.  With  these in place, the 15-bearing is then used with a standard femur and a  standard patella is made.  Excellent alignment and stability in flexion-  and-extension are obtained.   Permanent components are then obtained and cemented in place.  The  tourniquet is let down at approximately 1 hour.  After excess cement is  removed and the cautery is  used, the knee is closed in layers with #1  Tycron, #0  and 2-0 Vicryl and skin clips.  Operating time about 1 hour  and 20 minutes.  The patient tolerated the procedure well and returned  to recovery in good condition.      John L. Rendall, M.D.  Electronically Signed     JLR/MEDQ  D:  05/08/2006  T:  05/08/2006  Job:  [id number]

## 2010-07-02 NOTE — H&P (Signed)
Hailey [name], EMS                             ACCOUNT NO.:  [id number]   MEDICAL RECORD NO.:  [id number]                   PATIENT TYPE:  INP   LOCATION:  0467                                 [facility]AC[facility]   PHYSICIAN:  Corwin Levins, M.D. [facility]             DATE OF BIRTH:  [birth year]   DATE OF ADMISSION:  11/08/2002  DATE OF DISCHARGE:                                HISTORY & PHYSICAL   CHIEF COMPLAINT:  Twenty four hours projectile nausea/vomiting.   HISTORY OF PRESENT ILLNESS:  Hailey [name] is a 77 year old white female here with  24 hours of the above with severe epigastric discomfort and small amount of  coffee-grounds emesis as well as bright red blood/hematemesis.  She had an  EGD with Dr. Marina Goodell April 2004 and has known gastric ectasia status post  cautery at that time, there is no orthostatic symptoms at this time, Reglan  was no help, p.o. last night, she is unable now to take p.o. at all.   PAST MEDICAL HISTORY:  1. Gastric vascular ectasia/watermelon stomach per EGD April 2004.  2. 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  3. GERD.  4. IBS.  5. Hypertension.  6. Hypercholesterolemia.  7. History of pulmonary embolus.  8. Anemia.  9. Migraine.  10.      History of cerebral aneurysm.  11.      Fibromyalgia.  12.      Mitral valve prolapse.   PAST SURGICAL HISTORY:  1. Status post TAH.  2. Status post T&A.  3. Status post appendectomy.  4. History of cerebral aneurysm surgery.  5. Status post right inguinal hernia surgery.  6. Status post bilateral feet surgery.  7. Status post bladder surgery.   ALLERGIES INCLUDE:  MACRODANTIN, TORADOL, CODEINE, PYRIDIUM.   CURRENT MEDICATIONS:  1. Levsin sublingual p.r.n.  2. Nexium 40 mg b.i.d.  3. Vasotec 10 mg p.o. once daily.  4. Premarin 0.125 mg p.o. once daily.  5. Paxil 30 mg p.o. once daily.  6. Celebrex 200 mg b.i.d.  7. Lipitor 20 mg once daily.  8. Neurontin 400 mg three times daily.  9. Reglan 5 mg before every meal and  at bedtime.  10.      Demadex 20 mg once daily.  11.      Doxepin 50 mg at bedtime.   SOCIAL HISTORY:  Tobacco: Half pack per day.  Alcohol: Occasional.   FAMILY HISTORY:  Otherwise noncontributory.   PHYSICAL EXAMINATION:  GENERAL:  Hailey [name] is a 77 year old white female  mildly ill appearing.  VITAL SIGNS:  Blood pressure 130/60, respirations 20, pulse 76, temperature  99.3, weight 154.  She is mildly obese per height.  ENT:  Sclerae are clear.  TMs clear.  Pharynx benign.  NECK:  Without lymphadenopathy, JVD, or thyromegaly.  CHEST:  No rales or wheezing.  CARDIAC:  Regular rate and rhythm.  No murmur.  ABDOMEN:  Soft, positive bowel sounds with marked epigastric tenderness.  No  distention.  EXTREMITIES:  No edema.  NEUROLOGICAL:  Cranial nerves II-XII intact, otherwise nonfocal.  SKIN:  No significant lesion.  BREASTS/PELVIC:  Preferred per patient.   ASSESSMENT AND PLAN:  1. Nausea, vomiting, abdominal pain with coffee-grounds emesis/upper     gastrointestinal bleeding.  She is to be admitted, made nothing by mouth,     given intravenous fluids as well as antiemetics, Reglan intravenous, and     pain medications on an as-needed basis.  We will also check     abdominopelvic CT as well as routine laboratories, cultures of the blood     and urine, ECG, chest x-ray, amylase, lipase, also discontinue Celebrex     given the abdominal discomfort.  We will need to consider     gastrointestinal consultation and/or nasogastric tube for uncontrolled     vomiting.  Type and hold for 2 units packed red blood cells transfused     for hemoglobin less than 8.  2. Other medical problems.  Change other medications to intravenous     Protonix, intravenous Vasotec, hold the Demadex and Doxepin, continue all     other medications by mouth if able to keep them down.   This information has been relayed to Dr. Drue Novel who is on call for [place] tonight  and plan to follow up results.                                                Corwin Levins, M.D. [facility]    JWJ/MEDQ  D:  11/08/2002  T:  11/08/2002  Job:  [id number]

## 2010-07-05 ENCOUNTER — Encounter: Payer: Medicare Other | Admitting: Physical Therapy

## 2010-07-26 ENCOUNTER — Ambulatory Visit: Payer: Medicare Other | Attending: Orthopedic Surgery | Admitting: Physical Therapy

## 2010-07-26 DIAGNOSIS — IMO0001 Reserved for inherently not codable concepts without codable children: Secondary | ICD-10-CM | POA: Insufficient documentation

## 2010-07-26 DIAGNOSIS — M25519 Pain in unspecified shoulder: Secondary | ICD-10-CM | POA: Insufficient documentation

## 2010-07-26 DIAGNOSIS — M25619 Stiffness of unspecified shoulder, not elsewhere classified: Secondary | ICD-10-CM | POA: Insufficient documentation

## 2010-07-30 ENCOUNTER — Encounter: Payer: Self-pay | Admitting: Cardiology

## 2010-07-30 ENCOUNTER — Encounter: Payer: Self-pay | Admitting: Cardiovascular Disease

## 2010-09-15 ENCOUNTER — Encounter: Payer: Self-pay | Admitting: Internal Medicine

## 2010-10-20 ENCOUNTER — Encounter: Payer: Self-pay | Admitting: Cardiology

## 2010-10-25 ENCOUNTER — Encounter: Payer: Self-pay | Admitting: Cardiology

## 2010-10-25 ENCOUNTER — Ambulatory Visit (INDEPENDENT_AMBULATORY_CARE_PROVIDER_SITE_OTHER): Payer: Medicare Other | Admitting: Cardiology

## 2010-10-25 DIAGNOSIS — I421 Obstructive hypertrophic cardiomyopathy: Secondary | ICD-10-CM

## 2010-10-25 DIAGNOSIS — I422 Other hypertrophic cardiomyopathy: Secondary | ICD-10-CM

## 2010-10-25 DIAGNOSIS — R002 Palpitations: Secondary | ICD-10-CM

## 2010-10-25 DIAGNOSIS — E785 Hyperlipidemia, unspecified: Secondary | ICD-10-CM

## 2010-10-25 DIAGNOSIS — R079 Chest pain, unspecified: Secondary | ICD-10-CM

## 2010-10-25 MED ORDER — METOPROLOL SUCCINATE ER 100 MG PO TB24: 100.0000 mg | ORAL_TABLET | Freq: Every day | ORAL | Status: DC

## 2010-10-25 NOTE — Patient Instructions (Signed)
Increase Toprol XL to 100mg  daily.  Your physician has requested that you have a lexiscan myoview. For further information please visit [email or web address] Please follow instruction sheet, as given.    Your physician has recommended that you wear a holter monitor. Holter monitors are medical devices that record the heart's electrical activity. Doctors most often use these monitors to diagnose arrhythmias. Arrhythmias are problems with the speed or rhythm of the heartbeat. The monitor is a small, portable device. You can wear one while you do your normal daily activities. This is usually used to diagnose what is causing palpitations/syncope (passing out). 24 hour monitor   Your physician recommends that you schedule a follow-up appointment in: 2-3 weeks with Dr Shirlee Latch.

## 2010-10-26 DIAGNOSIS — R079 Chest pain, unspecified: Secondary | ICD-10-CM | POA: Insufficient documentation

## 2010-10-26 DIAGNOSIS — R002 Palpitations: Secondary | ICD-10-CM | POA: Insufficient documentation

## 2010-10-26 NOTE — Assessment & Plan Note (Signed)
Given palpitations in the setting of hypertrophic cardiomyopathy, I will get a holter monitor to assess for ventricular ectopy.

## 2010-10-26 NOTE — Assessment & Plan Note (Addendum)
Murmur sounds a bit louder than at last visit.  Echo in 3/12 showed no LV outflow tract gradient and patient is taking the same dose of beta blocker as prior.  However, she has been having more exertional chest pain and dyspnea.  These symptoms could certainly come from worsened LV outflow obstruction.  I will have her increase Toprol XL to 100 mg daily.  He children need to be screened by echoes for HCM.

## 2010-10-26 NOTE — Assessment & Plan Note (Signed)
Will get last lipids and BMET from PCP.

## 2010-10-26 NOTE — Progress Notes (Signed)
PCP: Dr. Randa Lynn  77 yo with history of hypertrophic cardiomyopathy, GI bleeding, venous thromboembolism, and COPD presents for cardiology followup.  Echo in 4/09 showed asymmetry basal septal hypertrophy with an LVOT gradient to 130 mmHg with valsalva.  She was started on Toprol XL based on this finding.  She also had a cath in 2009 without significant CAD.  Repeat echo in 3/12 on beta blocker showed mild focal basal septal hypertrophy, EF 60-65%, no significant LV outflow tract gradient, and no mitral valve SAM.  This spring, she had a rotator cuff repair without complications.   Over the last 2-3 weeks, she has noted pain in her left chest radiating to the left shoulder and down the arm.  This occurs daily and can last for hours at a time.  It is not always exertional, but she does note the pain when she pushes a grocery cart or pushes a stroller with her grand-children. She is short of breath after walking about 100 feet or with climbing a flight of steps.  She has fairly frequent palpitations.  No lightheadedness or syncope.   ECG: NSR, slight scooped inferior ST depression  Allergies (verified):  1)  ! Toradol 2)  ! Macrodantin 3)  ! Codeine 4)  ! Pyridium  Past Medical History: 1. LLE DVT - August 2010, recurrent hx.  Has IVC filter. Not on coumadin now with history of GI bleeding.  2. HYPERLIPIDEMIA (ICD-272.4) 3. GASTROESOPHAGEAL REFLUX DISEASE (ICD-530.81) 4. Hypertrophic cardiomyopathy: Echo (4/09) with EF 70-75%, asymmetricy basal septal hypertrophy, mild MR without systolic anterior motion of the mitral valve, LVOT gradient to 130 mmHg with Valsalva.  This echo was not done on a beta blocker.  Echo in 3/12 on Toprol XL showed EF 60-65%, mild focal basal hypertrophy, no LVOT gradient, no SAM, moderate diastolic dysfunction, mild MR.  5. ANEMIA, CHRONIC (ICD-281.9) 6. OSTEOARTHRITIS (ICD-715.90): History of left TKR 7. FIBROMYALGIA (ICD-729.1) 8. Hx of CEREBRAL ANEURYSM (ICD-437.3) 9.  Hx of MIGRAINE HEADACHE (ICD-346.90) 10. HYPERTENSION (ICD-401.9) 11. COPD 12. Left heart cath 2009 without significant CAD.  Myoview (2/11) with EF 72%, no ischemia.  13. PUD with GI bleeding  Family History: Family History of Colon Cancer:FATHER,BROTHER Family History of Pancreatic Cancer:MOTHER Family History of Colon Polyps:SIBLINJGS, FATHER Family History of Heart Disease: FATHER, SISTERS  Social History: Occupation: RETIRED Quit smoking in 11/10 Alcohol Use - yes-SOCIALLY Daily Caffeine Use: 1 CUP COFFEE Illicit Drug Use - no Patient does not get regular exercise.  Lives in [place] with daughter  Review of Systems        All systems reviewed and negative except as per HPI.   Current Outpatient Prescriptions  Medication Sig Dispense Refill  . citalopram (CELEXA) 40 MG tablet Take 40 mg by mouth daily.        Marland Kitchen dimenhyDRINATE (DRAMAMINE) 50 MG tablet Take 50 mg by mouth every 8 (eight) hours as needed.        . diphenhydrAMINE (BENADRYL) 25 MG tablet Take 25 mg by mouth as needed.        . Fluticasone-Salmeterol (ADVAIR DISKUS) 250-50 MCG/DOSE AEPB Inhale 1 puff into the lungs daily.        Marland Kitchen gabapentin (NEURONTIN) 100 MG capsule Take 200 mg by mouth 2 (two) times daily.       Marland Kitchen HYDROcodone-acetaminophen (VICODIN) 5-500 MG per tablet Take 1 tablet by mouth 3 (three) times daily.       Marland Kitchen lovastatin (MEVACOR) 40 MG tablet Take 40 mg by mouth  daily.        . Melatonin 3 MG TABS Take 1 tablet by mouth at bedtime.        Marland Kitchen omeprazole (PRILOSEC OTC) 20 MG tablet Take 20 mg by mouth 2 (two) times daily.        . Polyethylene Glycol 3350 POWD        . tiotropium (SPIRIVA) 18 MCG inhalation capsule Place 18 mcg into inhaler and inhale as needed.        . torsemide (DEMADEX) 20 MG tablet Take 20 mg by mouth 2 (two) times daily.       . metoprolol (TOPROL XL) 100 MG 24 hr tablet Take 1 tablet (100 mg total) by mouth daily.  30 tablet  6    BP 136/61  Pulse 61  Ht 5' 2.25"  (1.581 m)  Wt 178 lb (80.74 kg)  BMI 32.30 kg/m2 General:  Well developed, well nourished, in no acute distress. Neck:  Neck supple, no JVD. No masses, thyromegaly or abnormal cervical nodes. Lungs:  Clear bilaterally to auscultation and percussion. Heart:  Non-displaced PMI, chest non-tender; regular rate and rhythm, S1, S2 without rubs or gallops. 2/6 systolic murmur left sternal border.  Carotid upstroke normal, no bruit.  Pedals normal pulses. No edema, no varicosities. Abdomen:  Bowel sounds positive; abdomen soft and non-tender without masses, organomegaly, or hernias noted. No hepatosplenomegaly. Extremities:  No clubbing or cyanosis. Neurologic:  Alert and oriented x 3. Psych:  Normal affect.

## 2010-10-26 NOTE — Assessment & Plan Note (Signed)
Chest pain could certainly derive from hypertrophic cardiomyopathy.  However, I do think that we should rule out ischemia.  I will get a Lexiscan myoview.

## 2010-11-02 ENCOUNTER — Encounter: Payer: Self-pay | Admitting: *Deleted

## 2010-11-04 ENCOUNTER — Encounter (INDEPENDENT_AMBULATORY_CARE_PROVIDER_SITE_OTHER): Payer: Medicare Other

## 2010-11-04 ENCOUNTER — Ambulatory Visit (HOSPITAL_COMMUNITY): Payer: Medicare Other | Attending: Cardiology | Admitting: Radiology

## 2010-11-04 DIAGNOSIS — R0602 Shortness of breath: Secondary | ICD-10-CM

## 2010-11-04 DIAGNOSIS — I421 Obstructive hypertrophic cardiomyopathy: Secondary | ICD-10-CM | POA: Insufficient documentation

## 2010-11-04 DIAGNOSIS — I422 Other hypertrophic cardiomyopathy: Secondary | ICD-10-CM

## 2010-11-04 DIAGNOSIS — R002 Palpitations: Secondary | ICD-10-CM

## 2010-11-04 DIAGNOSIS — R079 Chest pain, unspecified: Secondary | ICD-10-CM

## 2010-11-04 MED ORDER — TECHNETIUM TC 99M TETROFOSMIN IV KIT
33.0000 | PACK | Freq: Once | INTRAVENOUS | Status: AC | PRN
  Administered 2010-11-04: 33 via INTRAVENOUS

## 2010-11-04 MED ORDER — TECHNETIUM TC 99M TETROFOSMIN IV KIT
11.0000 | PACK | Freq: Once | INTRAVENOUS | Status: AC | PRN
  Administered 2010-11-04: 11 via INTRAVENOUS

## 2010-11-04 MED ORDER — REGADENOSON 0.4 MG/5ML IV SOLN
0.4000 mg | Freq: Once | INTRAVENOUS | Status: AC
  Administered 2010-11-04: 0.4 mg via INTRAVENOUS

## 2010-11-04 NOTE — Progress Notes (Signed)
[facility] SITE 3 NUCLEAR MED [address] [place] [address] [phone]  Cardiology Nuclear Med Study  Hailey [name] is a 77 y.o. female [id number] August 12, 1932   Nuclear Med Background Indication for Stress Test:  Evaluation for Ischemia History:'09 Heart Catheterization(NL mild n/oCAD;'00&2/11 Myocardial Perfusion Study NL,not not gated;3/12'Echo EF=60-65% mild MR Cardiac Risk Factors: Family History - CAD, History of Smoking, Hypertension and Lipids  Symptoms:  Chest Pain, Chest Pain with Exertion (last date of chest discomfort x 3days ago), Chest Tightness with Exertion (last date of chest discomfort 3 days ago), DOE, Fatigue, Light-Headedness, Nausea, Palpitations and SOB   Nuclear Pre-Procedure Caffeine/Decaff Intake:  None NPO After: 6:00am   Lungs: clear IV 0.9% NS with Angio Cath:  22g  IV Site: R Hand x 1, tolerated well  IV Started by:  Irean Hong, RN  Chest Size (in):  40 Cup Size: C  Height: 5\' 2"  (1.575 m)  Weight:  179 lb (81.194 kg)  BMI:  Body mass index is 32.74 kg/(m^2). Tech Comments:  Held toprol x 36 hrs    Nuclear Med Study 1 or 2 day study: 1 day  Stress Test Type:  Lexiscan  Reading MD: Willa Rough, MD  Order Authorizing Provider:  Marca Ancona, MD  Resting Radionuclide: Technetium 23m Tetrofosmin  Resting Radionuclide Dose: 11 mCi   Stress Radionuclide:  Technetium 79m Tetrofosmin  Stress Radionuclide Dose: 33 mCi           Stress Protocol Rest HR55 Stress HR: 67  Rest BP: 135/71 Stress BP: 130/65  Exercise Time (min): n/a METS: n/a   Predicted Max HR: 144 bpm % Max HR: 48.61 bpm Rate Pressure Product: 9450   Dose of Adenosine (mg):  n/a Dose of Lexiscan: 0.4 mg  Dose of Atropine (mg): n/a Dose of Dobutamine: n/a mcg/kg/min (at max HR)  Stress Test Technologist: Frederick Peers, EMT-P  Nuclear Technologist:  Domenic Polite, CNMT     Rest Procedure:  Myocardial perfusion imaging was performed at rest 45 minutes  following the intravenous administration of Technetium 57m Tetrofosmin. Rest ECG: SB  Stress Procedure:  The patient received IV Lexiscan 0.4 mg over 15-seconds.  Technetium 67m Tetrofosmin injected at 30-seconds.  There were no significant changes with Lexiscan.  Quantitative spect images were obtained after a 45 minute delay. Stress ECG: No significant change from baseline ECG  QPS Raw Data Images:  Normal; no motion artifact; normal heart/lung ratio. Stress Images:  Normal homogeneous uptake in all areas of the myocardium. Rest Images:  Normal homogeneous uptake in all areas of the myocardium. Subtraction (SDS):  No evidence of ischemia. Transient Ischemic Dilatation (Normal <1.22):  .89 Lung/Heart Ratio (Normal <0.45):  .33  Quantitative Gated Spect Images QGS EDV:  84 ml QGS ESV:  15 ml QGS cine images:  Normal Wall Motion QGS EF: 82%  Impression Exercise Capacity:  Lexiscan with no exercise. BP Response:  Normal blood pressure response. Clinical Symptoms:  Tightness ECG Impression:  No significant ST segment change suggestive of ischemia. Comparison with Prior Nuclear Study: No significant change from previous study  Overall Impression:  Normal stress nuclear study.  Willa Rough

## 2010-11-08 NOTE — Progress Notes (Signed)
Please let patient know that the stress test was normal.  Hailey [name]

## 2010-11-08 NOTE — Progress Notes (Signed)
Pt.notified

## 2010-11-09 ENCOUNTER — Telehealth: Payer: Self-pay | Admitting: *Deleted

## 2010-11-09 ENCOUNTER — Telehealth: Payer: Self-pay | Admitting: Cardiology

## 2010-11-09 NOTE — Telephone Encounter (Signed)
Per pt daughter rtn call from Dennie Bible to get results she is very hard to get to and she will be aval. Until 9am pls try to call

## 2010-11-09 NOTE — Telephone Encounter (Signed)
Dr Shirlee Latch reviewed monitor done 11/04/10-11/05/10. Avg HR 56. Rare PVCs. Frequent PACs. Runs of atrial tachycardia--continue current dose of Toprol XL(just increased). I discussed these findings and recommendations with pt's daughter, Burman Blacksmith. Report signed by Dr Shirlee Latch to Deliah Goody.

## 2010-11-09 NOTE — Telephone Encounter (Signed)
Patient and daughter aware of stress test result.

## 2010-11-10 LAB — POCT I-STAT 3, VENOUS BLOOD GAS (G3P V)
Bicarbonate: 24.3 — ABNORMAL HIGH
Bicarbonate: 25.1 — ABNORMAL HIGH
O2 Saturation: 56
O2 Saturation: 65
O2 Saturation: 65
Operator id: 221371
TCO2: 26
TCO2: 26
TCO2: 26
pCO2, Ven: 40.1 — ABNORMAL LOW
pCO2, Ven: 40.2 — ABNORMAL LOW
pCO2, Ven: 40.5 — ABNORMAL LOW
pH, Ven: 7.391 — ABNORMAL HIGH
pH, Ven: 7.403 — ABNORMAL HIGH
pO2, Ven: 29 — CL
pO2, Ven: 34

## 2010-11-10 LAB — POCT I-STAT 3, ART BLOOD GAS (G3+)
Acid-base deficit: 1
Bicarbonate: 22.7
O2 Saturation: 95
Operator id: 221371
TCO2: 24
pCO2 arterial: 33.1 — ABNORMAL LOW
pH, Arterial: 7.444 — ABNORMAL HIGH
pO2, Arterial: 74 — ABNORMAL LOW

## 2010-11-18 ENCOUNTER — Encounter: Payer: Self-pay | Admitting: Cardiology

## 2010-11-18 ENCOUNTER — Ambulatory Visit (INDEPENDENT_AMBULATORY_CARE_PROVIDER_SITE_OTHER): Payer: Medicare Other | Admitting: Cardiology

## 2010-11-18 VITALS — BP 118/62 | HR 60 | Ht 62.5 in | Wt 181.8 lb

## 2010-11-18 DIAGNOSIS — I421 Obstructive hypertrophic cardiomyopathy: Secondary | ICD-10-CM

## 2010-11-18 DIAGNOSIS — R079 Chest pain, unspecified: Secondary | ICD-10-CM

## 2010-11-18 DIAGNOSIS — I422 Other hypertrophic cardiomyopathy: Secondary | ICD-10-CM

## 2010-11-18 DIAGNOSIS — R002 Palpitations: Secondary | ICD-10-CM

## 2010-11-18 NOTE — Patient Instructions (Signed)
Decrease torsemide to 20mg  daily.  Schedule an appointment for lab--CBC/TSH 425.18  Your physician wants you to follow-up in: 4 months with Dr Shirlee Latch. You will receive a reminder letter in the mail two months in advance. If you don't receive a letter, please call our office to schedule the follow-up appointment.

## 2010-11-20 NOTE — Assessment & Plan Note (Addendum)
Murmur softer since increasing Toprol XL.  She continues to have exertional dyspnea.  Some of this may be due to deconditioning as she is fairly inactive due to knee pain.  However, she may be a bit on the dry side on twice daily torsemide, which could increase her LV outflow tract obstruction.  I will decrease torsemide to once daily.  Rare PVCs on holter.  No syncope.  No indication for ICD.  Her children need to be screened by echoes for HCM.   Followup in 4 months.

## 2010-11-20 NOTE — Progress Notes (Signed)
PCP: Dr. Randa Lynn  77 yo with history of hypertrophic cardiomyopathy, GI bleeding, venous thromboembolism, and COPD presents for cardiology followup.  Echo in 4/09 showed asymmetry basal septal hypertrophy with an LVOT gradient to 130 mmHg with valsalva.  She was started on Toprol XL based on this finding.  She also had a cath in 2009 without significant CAD.  Repeat echo in 3/12 on beta blocker showed mild focal basal septal hypertrophy, EF 60-65%, no significant LV outflow tract gradient, and no mitral valve SAM.  This spring, she had a rotator cuff repair without complications.  Prior to last appointment, patient reported episodes of chest pain.  Lexiscan myoview showed no ischemia or infarction.  Holter showed rare PVCs and a run of atrial tachycardia.  I also increased her Toprol XL to 100 mg daily at last appointment.   She is short of breath after walking about 100 feet or with climbing a flight of steps.  She continues to have occasional palpitations.  No lightheadedness or syncope.  Rare atypical chest pain now.  She is fairly inactive due to knee pain and will eventually need a right TKR.    Allergies (verified):  1)  ! Toradol 2)  ! Macrodantin 3)  ! Codeine 4)  ! Pyridium  Past Medical History: 1. LLE DVT - August 2010, recurrent hx.  Has IVC filter. Not on coumadin now with history of GI bleeding.  2. HYPERLIPIDEMIA (ICD-272.4) 3. GASTROESOPHAGEAL REFLUX DISEASE (ICD-530.81) 4. Hypertrophic cardiomyopathy: Echo (4/09) with EF 70-75%, asymmetric basal septal hypertrophy, mild MR without systolic anterior motion of the mitral valve, LVOT gradient to 130 mmHg with Valsalva.  This echo was not done on a beta blocker.  Echo in 3/12 on Toprol XL showed EF 60-65%, mild focal basal hypertrophy, no LVOT gradient, no SAM, moderate diastolic dysfunction, mild MR.  5. ANEMIA, CHRONIC (ICD-281.9) 6. OSTEOARTHRITIS (ICD-715.90): History of left TKR 7. FIBROMYALGIA (ICD-729.1) 8. Hx of CEREBRAL  ANEURYSM (ICD-437.3) 9. Hx of MIGRAINE HEADACHE (ICD-346.90) 10. HYPERTENSION (ICD-401.9) 11. COPD 12. Left heart cath 2009 without significant CAD.  Myoview (2/11) with EF 72%, no ischemia.  Lexiscan myoview (9/12): EF 82%, no ischemia or infarction.  13. PUD with GI bleeding: not on ASA 14. Atrial tachycardia: Patient had short runs of atrial tachycardia on 9/12 holter.  Only rare PVCs.   Family History: Family History of Colon Cancer:FATHER,BROTHER Family History of Pancreatic Cancer:MOTHER Family History of Colon Polyps:SIBLINJGS, FATHER Family History of Heart Disease: FATHER, SISTERS  Social History: Occupation: RETIRED Quit smoking in 11/10 Alcohol Use - yes-SOCIALLY Daily Caffeine Use: 1 CUP COFFEE Illicit Drug Use - no Patient does not get regular exercise.  Lives in [place] with daughter  Review of Systems        All systems reviewed and negative except as per HPI.   Current Outpatient Prescriptions  Medication Sig Dispense Refill  . citalopram (CELEXA) 40 MG tablet Take 40 mg by mouth daily.        Marland Kitchen dimenhyDRINATE (DRAMAMINE) 50 MG tablet Take 50 mg by mouth every 8 (eight) hours as needed.        . diphenhydrAMINE (BENADRYL) 25 MG tablet Take 25 mg by mouth as needed.        . Fluticasone-Salmeterol (ADVAIR DISKUS) 250-50 MCG/DOSE AEPB Inhale 1 puff into the lungs daily.        Marland Kitchen gabapentin (NEURONTIN) 100 MG capsule Take 200 mg by mouth 2 (two) times daily.       Marland Kitchen  HYDROcodone-acetaminophen (VICODIN) 5-500 MG per tablet Take 1 tablet by mouth 3 (three) times daily.       Marland Kitchen lovastatin (MEVACOR) 40 MG tablet Take 40 mg by mouth daily.        . Melatonin 3 MG TABS Take 1 tablet by mouth at bedtime.        . metoprolol (TOPROL XL) 100 MG 24 hr tablet Take 1 tablet (100 mg total) by mouth daily.  30 tablet  6  . omeprazole (PRILOSEC OTC) 20 MG tablet Take 20 mg by mouth 2 (two) times daily.        . Polyethylene Glycol 3350 POWD        . tiotropium (SPIRIVA) 18 MCG  inhalation capsule Place 18 mcg into inhaler and inhale as needed.        . torsemide (DEMADEX) 20 MG tablet Take 1 tablet (20 mg total) by mouth daily.        BP 118/62  Pulse 60  Ht 5' 2.5" (1.588 m)  Wt 181 lb 12.8 oz (82.464 kg)  BMI 32.72 kg/m2 General:  Well developed, well nourished, in no acute distress. Neck:  Neck supple, no JVD. No masses, thyromegaly or abnormal cervical nodes. Lungs:  Clear bilaterally to auscultation and percussion. Heart:  Non-displaced PMI, chest non-tender; regular rate and rhythm, S1, S2 without rubs or gallops. 1/6 systolic murmur left sternal border.  Carotid upstroke normal, no bruit.  Pedals normal pulses. No edema, no varicosities. Abdomen:  Bowel sounds positive; abdomen soft and non-tender without masses, organomegaly, or hernias noted. No hepatosplenomegaly. Extremities:  No clubbing or cyanosis. Neurologic:  Alert and oriented x 3. Psych:  Normal affect.

## 2010-11-20 NOTE — Assessment & Plan Note (Signed)
Atypical, negative myoview.  May be from HCM with outflow tract obstruction.  It did seem to get better with increased beta blockade.

## 2010-11-20 NOTE — Assessment & Plan Note (Signed)
Likely due to short runs of atrial tachycardia (seen on holter).  Only rare PVCs: no significant ventricular arrhythmias.  I did increase her Toprol XL to 100 mg daily.

## 2010-11-23 ENCOUNTER — Other Ambulatory Visit: Payer: Medicare Other | Admitting: *Deleted

## 2010-11-26 ENCOUNTER — Ambulatory Visit: Payer: Self-pay | Admitting: Gastroenterology

## 2010-12-03 ENCOUNTER — Ambulatory Visit: Payer: Self-pay | Admitting: Gastroenterology

## 2010-12-06 LAB — PATHOLOGY REPORT

## 2010-12-07 ENCOUNTER — Other Ambulatory Visit (INDEPENDENT_AMBULATORY_CARE_PROVIDER_SITE_OTHER): Payer: Medicare Other

## 2010-12-07 DIAGNOSIS — I422 Other hypertrophic cardiomyopathy: Secondary | ICD-10-CM

## 2010-12-07 DIAGNOSIS — R002 Palpitations: Secondary | ICD-10-CM

## 2010-12-07 LAB — CBC WITH DIFFERENTIAL/PLATELET
Basophils Absolute: 0 10*3/uL (ref 0.0–0.1)
Basophils Relative: 0.3 % (ref 0.0–3.0)
Eosinophils Absolute: 0.1 10*3/uL (ref 0.0–0.7)
Hemoglobin: 11.8 g/dL — ABNORMAL LOW (ref 12.0–15.0)
Lymphocytes Relative: 33.1 % (ref 12.0–46.0)
Lymphs Abs: 1.8 10*3/uL (ref 0.7–4.0)
MCHC: 33.5 g/dL (ref 30.0–36.0)
MCV: 91.1 fl (ref 78.0–100.0)
Monocytes Absolute: 0.5 10*3/uL (ref 0.1–1.0)
Neutro Abs: 3 10*3/uL (ref 1.4–7.7)
RBC: 3.86 Mil/uL — ABNORMAL LOW (ref 3.87–5.11)
RDW: 14.6 % (ref 11.5–14.6)

## 2011-05-10 ENCOUNTER — Other Ambulatory Visit: Payer: Self-pay | Admitting: Cardiology

## 2011-05-19 ENCOUNTER — Ambulatory Visit: Payer: Medicare Other | Admitting: Cardiology

## 2011-08-16 ENCOUNTER — Ambulatory Visit: Payer: Self-pay | Admitting: Internal Medicine

## 2011-08-23 ENCOUNTER — Encounter: Payer: Self-pay | Admitting: Nurse Practitioner

## 2011-08-23 ENCOUNTER — Ambulatory Visit (INDEPENDENT_AMBULATORY_CARE_PROVIDER_SITE_OTHER): Payer: Medicare Other | Admitting: Nurse Practitioner

## 2011-08-23 VITALS — BP 122/60 | HR 52 | Ht 63.0 in | Wt 184.6 lb

## 2011-08-23 DIAGNOSIS — I422 Other hypertrophic cardiomyopathy: Secondary | ICD-10-CM

## 2011-08-23 DIAGNOSIS — R5381 Other malaise: Secondary | ICD-10-CM

## 2011-08-23 DIAGNOSIS — R4689 Other symptoms and signs involving appearance and behavior: Secondary | ICD-10-CM

## 2011-08-23 DIAGNOSIS — IMO0002 Reserved for concepts with insufficient information to code with codable children: Secondary | ICD-10-CM

## 2011-08-23 DIAGNOSIS — R5383 Other fatigue: Secondary | ICD-10-CM

## 2011-08-23 LAB — TSH: TSH: 1.41 u[IU]/mL (ref 0.35–5.50)

## 2011-08-23 MED ORDER — METOPROLOL TARTRATE 50 MG PO TABS: 50.0000 mg | ORAL_TABLET | Freq: Two times a day (BID) | ORAL | Status: DC

## 2011-08-23 NOTE — Assessment & Plan Note (Signed)
Patient presents today with several complaints. She does note chest tightness. Has no significant CAD per cath back in 2009. Negative Myoview just 10 months ago. We will update her echo. Further disposition to follow. Patient is agreeable to this plan and will call if any problems develop in the interim.

## 2011-08-23 NOTE — Patient Instructions (Addendum)
We need to recheck the ultrasound of your heart.   We need to check your thyroid level  Lets change your Toprol to Lopressor 50 mg two times a day  Dr. Shirlee Latch will see you in a month to recheck you.  Call the [facility] office at [phone] if you have any questions, problems or concerns.

## 2011-08-23 NOTE — Assessment & Plan Note (Signed)
Will update her echo to look for changes. We have changed her Toprol over to Lopressor 50 mg BID for cost reasons but this may also help with her heart rate. She is bradycardic today and had her medicines just a couple of hours prior to this visit. She does not appear symptomatic in this regards. She is to see Dr. Shirlee Latch back in a month with a repeat EKG.

## 2011-08-23 NOTE — Assessment & Plan Note (Signed)
I did not feel this was cardiac related. But will see how her echo looks and how she does with the change in medicine. May just be normal age variant. Will check TSH today as well.

## 2011-08-23 NOTE — Progress Notes (Signed)
Hailey [name] Date of Birth: [birth year] Medical Record #[id number]  History of Present Illness: Hailey [name] is seen today for a work in visit. She is seen for Dr. Shirlee Latch. She has not been seen since October. Has a history of a hypertrophic CM with last echo in March of 2012, prior GI bleeding, DVT with IVC filter in place, fibromyalgia, GERD, chronic anemia, HTN, COPD and history of short runs of atrial tach. She had a heart cath back in 2009 showing no significant CAD. Myoview in September of 2012 was normal with no ischemia. Last echo on beta blocker therapy showed mild focal basal septal hypertrophy, EF 60 to 65%, no significant LV outflow tract gradient and no mitral valve SAM. She has continued with medical management.   She comes in today. She is here with her daughter who provides a lot of the history. She has several complaints. She notes that her shoulder hurts with bending over. Her daughter reports more chest pain and she is worried about "blockages". The patient then also noted that she was having more chest tightness. It is with and without exertion. She is wondering if she needs NTG. She is not very active. Still drives a little. No exercise routine. Some swelling in her legs but she uses salt. No support stockings. Too hard to put on. Daughter is more concerned with personality changes and "odd behavior" noted over the past month or so. She has seen her PCP last month, but was not having these symptoms then. Labs were checked and are reviewed here today. No TSH noted. She wants to change her Toprol due to expense. She is not lightheaded or dizzy. No syncope reported.   Current Outpatient Prescriptions on File Prior to Visit  Medication Sig Dispense Refill  . citalopram (CELEXA) 40 MG tablet Take 40 mg by mouth daily.        . diphenhydrAMINE (BENADRYL) 25 MG tablet Take 25 mg by mouth as needed.        . gabapentin (NEURONTIN) 100 MG capsule Take 200 mg by mouth 2 (two) times daily.       Marland Kitchen  lovastatin (MEVACOR) 40 MG tablet Take 40 mg by mouth daily.        . Melatonin 3 MG TABS Take 1 tablet by mouth at bedtime.        . pantoprazole (PROTONIX) 40 MG tablet Take 40 mg by mouth daily.      . Polyethylene Glycol 3350 POWD        . tiotropium (SPIRIVA) 18 MCG inhalation capsule Place 18 mcg into inhaler and inhale as needed.        . torsemide (DEMADEX) 20 MG tablet Take 1 tablet (20 mg total) by mouth daily.      Marland Kitchen dimenhyDRINATE (DRAMAMINE) 50 MG tablet Take 50 mg by mouth every 8 (eight) hours as needed.        . Fluticasone-Salmeterol (ADVAIR DISKUS) 250-50 MCG/DOSE AEPB Inhale 1 puff into the lungs daily.        . metoprolol (LOPRESSOR) 50 MG tablet Take 1 tablet (50 mg total) by mouth 2 (two) times daily.  180 tablet  3    Allergies  Allergen Reactions  . Codeine     REACTION: causes nausea  . Compazine Other (See Comments)    ? Confusion per patient  . Ketorolac Tromethamine   . Nitrofurantoin     REACTION: causes rash  . Nsaids   . Phenazopyridine Hcl  REACTION: causes vision problems  . Pyridium Plus (Phenazopyridine-Butabarb-Hyosc)     Past Medical History  Diagnosis Date  . DVT (deep vein thrombosis) in pregnancy 09/2008    LLE, recurrent hx, has IVC filter. Not on coumadin now with history of GI bleeding  . Hyperlipidemia   . GERD (gastroesophageal reflux disease)   . Hypertrophic cardiomyopathy     Echo 4/09 with EF 70-75%, asymmetricy basal septal hypertrophy, mild MR without systolic anterior motion of the mitral valve, LVOT gradient to 130 mmHg with Valsalva  . Anemia     Chronic  . Osteoarthritis     Hx of left TKR  . Fibromyalgia   . Cerebral aneurysm     Hx of  . Migraine headache     Hx of  . Hypertension   . COPD (chronic obstructive pulmonary disease)   . PUD (peptic ulcer disease)     With GI bleeding  . History of colonoscopy     Past Surgical History  Procedure Date  . Cardiac catheterization     Left heart cath 2009 without  significant CAD. Myoview (2/11) with EF 72%, no ischemia  . Total knee arthroplasty     Left  . Bladder suspension   . Foot surgery     Bilateral  . Inguinal hernia repair     Right  . Cerebral aneurysm repair   . Abdominal hysterectomy   . Appendectomy   . Tonsillectomy and adenoidectomy   . Shoulder surgery     Right  . Knee arthroscopy     Right    History  Smoking status  . Former Smoker  . Quit date: 12/15/2008  Smokeless tobacco  . Not on file    History  Alcohol Use  . Yes    Socially    Family History  Problem Relation Age of Onset  . Pancreatic cancer Mother   . Colon cancer Father   . Colon polyps Father   . Heart disease Father   . Heart disease Sister     More than 1 sister  . Colon cancer Brother   . Colon polyps Other     Siblings    Review of Systems: The review of systems is per the HPI.  All other systems were reviewed and are negative.  Physical Exam: BP 122/60  Pulse 52  Ht 5\' 3"  (1.6 m)  Wt 184 lb 9.6 oz (83.734 kg)  BMI 32.70 kg/m2  SpO2 92% Patient is very pleasant and in no acute distress. She is obese. Skin is warm and dry. Color is normal.  HEENT is unremarkable. Normocephalic/atraumatic. PERRL. Sclera are nonicteric. Neck is supple. No masses. No JVD. No bruits noted.  Lungs are clear. Cardiac exam shows a regular rate and rhythm. Very faint systolic murmur noted.  Abdomen is soft. Extremities are with just trace edema. Gait and ROM are intact. No gross neurologic deficits noted.  LABORATORY DATA: TSH is pending for today.  EKG today shows sinus bradycardia. Rate is down to 48. She has nonspecific ST changes.    Assessment / Plan:

## 2011-08-24 ENCOUNTER — Encounter: Payer: Self-pay | Admitting: Nurse Practitioner

## 2011-08-24 ENCOUNTER — Ambulatory Visit (HOSPITAL_COMMUNITY): Payer: Medicare Other | Attending: Cardiovascular Disease | Admitting: Radiology

## 2011-08-24 DIAGNOSIS — I059 Rheumatic mitral valve disease, unspecified: Secondary | ICD-10-CM | POA: Insufficient documentation

## 2011-08-24 DIAGNOSIS — I517 Cardiomegaly: Secondary | ICD-10-CM | POA: Insufficient documentation

## 2011-08-24 DIAGNOSIS — I428 Other cardiomyopathies: Secondary | ICD-10-CM

## 2011-08-24 DIAGNOSIS — I82409 Acute embolism and thrombosis of unspecified deep veins of unspecified lower extremity: Secondary | ICD-10-CM | POA: Insufficient documentation

## 2011-08-24 DIAGNOSIS — R5383 Other fatigue: Secondary | ICD-10-CM

## 2011-08-24 DIAGNOSIS — R5381 Other malaise: Secondary | ICD-10-CM | POA: Insufficient documentation

## 2011-08-24 DIAGNOSIS — I10 Essential (primary) hypertension: Secondary | ICD-10-CM | POA: Insufficient documentation

## 2011-08-24 DIAGNOSIS — J449 Chronic obstructive pulmonary disease, unspecified: Secondary | ICD-10-CM | POA: Insufficient documentation

## 2011-08-24 DIAGNOSIS — I422 Other hypertrophic cardiomyopathy: Secondary | ICD-10-CM

## 2011-08-24 DIAGNOSIS — J4489 Other specified chronic obstructive pulmonary disease: Secondary | ICD-10-CM | POA: Insufficient documentation

## 2011-08-24 NOTE — Progress Notes (Signed)
Echocardiogram performed.  

## 2011-08-25 ENCOUNTER — Other Ambulatory Visit (HOSPITAL_COMMUNITY): Payer: Medicare Other

## 2011-08-26 ENCOUNTER — Telehealth: Payer: Self-pay | Admitting: Cardiology

## 2011-08-26 NOTE — Telephone Encounter (Signed)
Please return call to patient at [phone] regarding test results.

## 2011-08-26 NOTE — Telephone Encounter (Signed)
Spoke with pt about recent echo results. 

## 2011-10-07 ENCOUNTER — Ambulatory Visit (INDEPENDENT_AMBULATORY_CARE_PROVIDER_SITE_OTHER): Payer: Medicare Other | Admitting: Cardiology

## 2011-10-07 ENCOUNTER — Encounter: Payer: Self-pay | Admitting: Cardiology

## 2011-10-07 VITALS — BP 118/62 | HR 46 | Ht 63.0 in | Wt 185.0 lb

## 2011-10-07 DIAGNOSIS — J449 Chronic obstructive pulmonary disease, unspecified: Secondary | ICD-10-CM

## 2011-10-07 DIAGNOSIS — R4189 Other symptoms and signs involving cognitive functions and awareness: Secondary | ICD-10-CM

## 2011-10-07 DIAGNOSIS — G459 Transient cerebral ischemic attack, unspecified: Secondary | ICD-10-CM

## 2011-10-07 DIAGNOSIS — I421 Obstructive hypertrophic cardiomyopathy: Secondary | ICD-10-CM

## 2011-10-07 DIAGNOSIS — R079 Chest pain, unspecified: Secondary | ICD-10-CM

## 2011-10-07 DIAGNOSIS — F09 Unspecified mental disorder due to known physiological condition: Secondary | ICD-10-CM

## 2011-10-07 LAB — BASIC METABOLIC PANEL
BUN: 21 mg/dL (ref 6–23)
Creatinine, Ser: 0.8 mg/dL (ref 0.4–1.2)
GFR: 71.83 mL/min (ref >60.00)
Glucose, Bld: 90 mg/dL (ref 70–99)
Potassium: 4 mEq/L (ref 3.5–5.1)

## 2011-10-07 NOTE — Patient Instructions (Addendum)
Your physician recommends that you have  lab work today--BMET.  Your physician has requested that you have cardiac CT. Cardiac computed tomography (CT) is a painless test that uses an x-ray machine to take clear, detailed pictures of your heart. For further information please visit [email or web address] Please follow instruction sheet as given.  You have been referred to [facility] for evaluation and management of COPD.  Your physician has requested that you have a carotid duplex. This test is an ultrasound of the carotid arteries in your neck. It looks at blood flow through these arteries that supply the brain with blood. Allow one hour for this exam. There are no restrictions or special instructions.   Your physician wants you to follow-up in: 6 months with Dr Shirlee Latch. (February 2014). You will receive a reminder letter in the mail two months in advance. If you don't receive a letter, please call our office to schedule the follow-up appointment.

## 2011-10-09 DIAGNOSIS — R4189 Other symptoms and signs involving cognitive functions and awareness: Secondary | ICD-10-CM | POA: Insufficient documentation

## 2011-10-09 DIAGNOSIS — J449 Chronic obstructive pulmonary disease, unspecified: Secondary | ICD-10-CM | POA: Insufficient documentation

## 2011-10-09 NOTE — Assessment & Plan Note (Addendum)
Significant COPD with exertional dyspnea.  She asks for a pulmonary referral, which I will make.  Pulmonary rehab may be an option to get her more active.

## 2011-10-09 NOTE — Progress Notes (Signed)
PCP: Dr. Randa Lynn  78 yo with history of hypertrophic cardiomyopathy, GI bleeding, venous thromboembolism, and COPD presents for cardiology followup.  Echo in 4/09 showed asymmetry basal septal hypertrophy with an LVOT gradient to 130 mmHg with valsalva.  She was started on Toprol XL based on this finding.  She also had a cath in 2009 without significant CAD.  Repeat echo in 3/12 on beta blocker showed mild focal basal septal hypertrophy, EF 60-65%, no significant LV outflow tract gradient, and no mitral valve SAM.  She has had chest pain chronically.   Lexiscan myoview in 9/12 showed no ischemia or infarction.  Holter showed rare PVCs and a run of atrial tachycardia.  She is now taking metoprolol 50 mg bid because Toprol XL was too expensive.  HR is 46 today.   She is chronically short of breath after walking about 100 feet or with climbing a flight of steps.  She continues to have occasional palpitations.  No lightheadedness or syncope.  She continues to be fairly inactive.  She reports central chest pain radiating up her neck.  This occurs with exertion 2-3 times a week.  She can be just walking around her house.  Symptoms resolve with rest.  This pattern has been present for at least 3-4 years.  Last cath in 2009 showed normal coronaries.  Echo in 7/13 showed normal EF, mild MR, and no significant LVOT gradient.  Family reports that she has had a cognitive decline recently.  They say that it is subtle but came on quickly.    Labs (7/13): TSH normal  Allergies (verified):  1)  ! Toradol 2)  ! Macrodantin 3)  ! Codeine 4)  ! Pyridium  Past Medical History: 1. LLE DVT - August 2010, recurrent hx.  Has IVC filter. Not on coumadin now with history of GI bleeding.  2. HYPERLIPIDEMIA (ICD-272.4) 3. GASTROESOPHAGEAL REFLUX DISEASE (ICD-530.81) 4. Hypertrophic cardiomyopathy: Echo (4/09) with EF 70-75%, asymmetric basal septal hypertrophy, mild MR without systolic anterior motion of the mitral valve, LVOT  gradient to 130 mmHg with Valsalva.  This echo was not done on a beta blocker.  Echo in 3/12 on Toprol XL showed EF 60-65%, mild focal basal hypertrophy, no LVOT gradient, no SAM, moderate diastolic dysfunction, mild MR.  Echo (7/13): EF 55-60%, no SAM or LVOT gradient, mild MR, PASP 34 mmHg.  5. ANEMIA, CHRONIC (ICD-281.9) 6. OSTEOARTHRITIS (ICD-715.90): History of left TKR 7. FIBROMYALGIA (ICD-729.1) 8. Hx of CEREBRAL ANEURYSM (ICD-437.3) 9. Hx of MIGRAINE HEADACHE (ICD-346.90) 10. HYPERTENSION (ICD-401.9) 11. COPD 12. Left heart cath 2009 without significant CAD.  Myoview (2/11) with EF 72%, no ischemia.  Lexiscan myoview (9/12): EF 82%, no ischemia or infarction.  13. PUD with GI bleeding: not on ASA 14. Atrial tachycardia: Patient had short runs of atrial tachycardia on 9/12 holter.  Only rare PVCs.   Family History: Family History of Colon Cancer:FATHER,BROTHER Family History of Pancreatic Cancer:MOTHER Family History of Colon Polyps:SIBLINJGS, FATHER Family History of Heart Disease: FATHER, SISTERS  Social History: Occupation: RETIRED Quit smoking in 11/10 Alcohol Use - yes-SOCIALLY Daily Caffeine Use: 1 CUP COFFEE Illicit Drug Use - no Patient does not get regular exercise.  Lives in [place] with daughter  Review of Systems        All systems reviewed and negative except as per HPI.   Current Outpatient Prescriptions  Medication Sig Dispense Refill  . acetaminophen (TYLENOL) 500 MG tablet Take 500 mg by mouth every 6 (six) hours as needed.      Marland Kitchen  citalopram (CELEXA) 40 MG tablet Take 40 mg by mouth daily.        Marland Kitchen dimenhyDRINATE (DRAMAMINE) 50 MG tablet Take 50 mg by mouth every 8 (eight) hours as needed.        . diphenhydrAMINE (BENADRYL) 25 MG tablet Take 25 mg by mouth as needed.        . Fluticasone-Salmeterol (ADVAIR DISKUS) 100-50 MCG/DOSE AEPB Inhale 1 puff into the lungs every 12 (twelve) hours.      . gabapentin (NEURONTIN) 100 MG capsule Take 200 mg by  mouth 2 (two) times daily.       Marland Kitchen lovastatin (MEVACOR) 40 MG tablet Take 40 mg by mouth daily.        . Melatonin 3 MG TABS Take 1 tablet by mouth at bedtime.        . metoprolol (LOPRESSOR) 50 MG tablet Take 1 tablet (50 mg total) by mouth 2 (two) times daily.  180 tablet  3  . pantoprazole (PROTONIX) 40 MG tablet Take 40 mg by mouth daily.      . Polyethylene Glycol 3350 POWD        . tiotropium (SPIRIVA) 18 MCG inhalation capsule Place 18 mcg into inhaler and inhale as needed.        . torsemide (DEMADEX) 20 MG tablet Take 20 mg by mouth 2 (two) times daily.       . traMADol (ULTRAM) 50 MG tablet Take 50 mg by mouth every 6 (six) hours as needed. 3-4 tablets daily, taking with tylenol        BP 118/62  Pulse 46  Ht 5\' 3"  (1.6 m)  Wt 185 lb (83.915 kg)  BMI 32.77 kg/m2  SpO2 94% General:  Well developed, well nourished, in no acute distress. Neck:  Neck supple, no JVD. No masses, thyromegaly or abnormal cervical nodes. Lungs:  Clear bilaterally to auscultation and percussion. Heart:  Non-displaced PMI, chest non-tender; regular rate and rhythm, S1, S2 without rubs or gallops. No murmur.  Carotid upstroke normal, no bruit.  Pedals normal pulses. No edema, no varicosities. Abdomen:  Bowel sounds positive; abdomen soft and non-tender without masses, organomegaly, or hernias noted. No hepatosplenomegaly. Extremities:  No clubbing or cyanosis. Neurologic:  Alert and oriented x 3. Psych:  Normal affect.

## 2011-10-09 NOTE — Assessment & Plan Note (Signed)
Patient has had chronic exertional chest pain x several years.  Cath in 2009 was normal and stress test in 9/12 was normal.  GIven ongoing symptoms, I am going to get a coronary CT angiogram to assess for coronary disease.   If this is normal, it is possible that chest pain is due to microvascular dysfunction.  If CT is normal, I would like her to get more active: walking for exercise.

## 2011-10-09 NOTE — Assessment & Plan Note (Signed)
Family reports subtle, but seemingly rather sudden, cognitive decline.  I will get carotid dopplers.

## 2011-10-09 NOTE — Assessment & Plan Note (Signed)
Last echo showed no SAM or LVOT obstruction.  This seems to be well controlled by beta blocker.  Will continue metoprolol with no change.  I do not think that HCM explains her chest pain.

## 2011-10-11 ENCOUNTER — Ambulatory Visit (INDEPENDENT_AMBULATORY_CARE_PROVIDER_SITE_OTHER)
Admission: RE | Admit: 2011-10-11 | Discharge: 2011-10-11 | Disposition: A | Discharge status: Home / Self Care | Payer: Medicare Other | Source: Ambulatory Visit | Attending: Pulmonary Disease | Admitting: Pulmonary Disease

## 2011-10-11 ENCOUNTER — Encounter: Payer: Self-pay | Admitting: Pulmonary Disease

## 2011-10-11 ENCOUNTER — Ambulatory Visit (INDEPENDENT_AMBULATORY_CARE_PROVIDER_SITE_OTHER): Payer: Medicare Other | Admitting: Pulmonary Disease

## 2011-10-11 ENCOUNTER — Encounter (INDEPENDENT_AMBULATORY_CARE_PROVIDER_SITE_OTHER): Payer: Medicare Other

## 2011-10-11 VITALS — BP 116/72 | HR 55 | Temp 97.8°F | Ht 63.0 in | Wt 186.6 lb

## 2011-10-11 DIAGNOSIS — G459 Transient cerebral ischemic attack, unspecified: Secondary | ICD-10-CM

## 2011-10-11 DIAGNOSIS — R0989 Other specified symptoms and signs involving the circulatory and respiratory systems: Secondary | ICD-10-CM

## 2011-10-11 DIAGNOSIS — R0609 Other forms of dyspnea: Secondary | ICD-10-CM

## 2011-10-11 DIAGNOSIS — I6529 Occlusion and stenosis of unspecified carotid artery: Secondary | ICD-10-CM

## 2011-10-11 NOTE — Assessment & Plan Note (Signed)
The patient has worsening dyspnea on exertion over the last 2 years, and carries the diagnosis of "COPD" although she has not had pulmonary function studies.  The patient has underlying cardiac disease, and is obese with significant deconditioning and debility.  At this point, we need to evaluate her for possible underlying lung disease, and we'll therefore schedule for full pulmonary function studies and a chest x-ray.  I will see her back on the same day as her PFTs so that we can discuss the results.  She also desaturates today with walking, so we'll start her on supplemental oxygen with exertion and sleep.

## 2011-10-11 NOTE — Patient Instructions (Addendum)
Continue current medications for now Will schedule for breathing tests in next week or so, and see you back here same day to review. Continue to work on weight reduction and conditioning. Will check cxr today, and call you with results.  Will start on oxygen with activity and sleep.  Do not need at rest when awake.

## 2011-10-11 NOTE — Progress Notes (Signed)
  Subjective:    Patient ID: Hailey [name], female    DOB: [birth year], 78 y.o.   MRN: [id number]  HPI The patient is a 78 year old female who had been asked to see for dyspnea on exertion.  She has been given the diagnosis of "COPD", but has never really had pulmonary function studies.  She does have a history of smoking a pack a day for 60 years, but has not smoked since 2010.  She notes worsening shortness of breath over the last few years, and describes a one half block dyspnea on exertion at a moderate pace on flat ground.  She will get winded bringing groceries in from the car, and also doing light housework.  She denies a significant cough or mucus, but does have chest tightness at times.  She has chronic lower extremity edema, and also underlying chronic heart disease.  She states that her weight is up about 20 pounds over the last one year.  She currently is on Advair and had very low dose that she takes on a regular basis, and also uses Spiriva only as needed.  She has not had a chest x-ray in over a year.   Review of Systems  Constitutional: Negative for fever and unexpected weight change.  HENT: Negative for ear pain, nosebleeds, congestion, sore throat, rhinorrhea, sneezing, trouble swallowing, dental problem, postnasal drip and sinus pressure.   Eyes: Negative for redness and itching.  Respiratory: Positive for shortness of breath. Negative for cough, chest tightness and wheezing.   Cardiovascular: Positive for chest pain and leg swelling. Negative for palpitations.  Gastrointestinal: Negative for nausea and vomiting.  Genitourinary: Negative for dysuria.  Musculoskeletal: Negative for joint swelling.  Skin: Negative for rash.  Neurological: Negative for headaches.  Hematological: Does not bruise/bleed easily.  Psychiatric/Behavioral: Negative for dysphoric mood. The patient is not nervous/anxious.        Objective:   Physical Exam Constitutional: obese female, no acute  distress  HENT:  Nares patent without discharge, but narrowed.  Oropharynx without exudate, palate and uvula are normal  Eyes:  Perrla, eomi, no scleral icterus  Neck:  No JVD, no TMG  Cardiovascular:  Normal rate, regular rhythm, no rubs or gallops.  No murmurs        Intact distal pulses but decreased.  Pulmonary :  Decreased breath sounds, no stridor or respiratory distress   No rales, rhonchi, or wheezing  Abdominal:  Soft, nondistended, bowel sounds present.  No tenderness noted.   Musculoskeletal: 1+ lower extremity edema noted.  Lymph Nodes:  No cervical lymphadenopathy noted  Skin:  No cyanosis noted  Neurologic:  Alert, appropriate, moves all 4 extremities without obvious deficit.         Assessment & Plan:

## 2011-10-11 NOTE — Addendum Note (Signed)
Addended by: Darrell Jewel on: 10/11/2011 04:33 PM   Modules accepted: Orders

## 2011-10-12 ENCOUNTER — Ambulatory Visit (INDEPENDENT_AMBULATORY_CARE_PROVIDER_SITE_OTHER): Payer: Medicare Other | Admitting: Pulmonary Disease

## 2011-10-12 DIAGNOSIS — R0609 Other forms of dyspnea: Secondary | ICD-10-CM

## 2011-10-12 LAB — PULMONARY FUNCTION TEST

## 2011-10-12 NOTE — Progress Notes (Signed)
PFT done today. 

## 2011-10-13 ENCOUNTER — Telehealth: Payer: Self-pay | Admitting: Pulmonary Disease

## 2011-10-13 NOTE — Telephone Encounter (Signed)
Pt needs ov to go over her pfts.  She was supposed to see me the same day as her pfts.

## 2011-10-18 ENCOUNTER — Ambulatory Visit (INDEPENDENT_AMBULATORY_CARE_PROVIDER_SITE_OTHER): Payer: Medicare Other | Admitting: Pulmonary Disease

## 2011-10-18 ENCOUNTER — Encounter: Payer: Self-pay | Admitting: Pulmonary Disease

## 2011-10-18 VITALS — BP 102/58 | HR 52 | Temp 98.1°F | Ht 63.0 in | Wt 186.8 lb

## 2011-10-18 DIAGNOSIS — J449 Chronic obstructive pulmonary disease, unspecified: Secondary | ICD-10-CM

## 2011-10-18 NOTE — Assessment & Plan Note (Addendum)
The patient has documented airflow obstruction on her PFTs that is manifested primarily as air trapping.  I suspect she primarily has mild disease, but have stressed to her the importance of daily Spiriva since this can significantly improve her documented air trapping.  I would like to keep her on Advair for now, but would have a low threshold to discontinue the inhaled corticosteroid.  I would like to see it more consistent use of Spiriva will help her symptom of chest tightness.  We'll also refer her to pulmonary rehabilitation, since I believe her debility, weight, and deconditioning are playing significant roles in her dyspnea as well.  She will also need good control of her volume status. If she continues to have chest tightness, would consider ct chest to r/o PE, even though she is not having pleuritic chest pain.

## 2011-10-18 NOTE — Progress Notes (Signed)
  Subjective:    Patient ID: Hailey [name], female    DOB: 27-Feb-1932, 78 y.o.   MRN: [id number]  HPI The patient comes in today for followup after her recent pulmonary function studies.  She was found to have a normal FEV1 percent, but does have an abnormal flow volume loop which suggests airflow obstruction.  She also has significant air trapping on her lung volumes, and a moderate decrease in DLCO that corrects to normal with alveolar volume adjustment.  I have reviewed the studies with her in detail, and answered all of her questions.   Review of Systems  Constitutional: Positive for unexpected weight change. Negative for fever.  HENT: Positive for rhinorrhea, sneezing, postnasal drip and sinus pressure. Negative for ear pain, nosebleeds, congestion, sore throat, trouble swallowing and dental problem.   Eyes: Negative for redness and itching.  Respiratory: Positive for cough, chest tightness, shortness of breath and wheezing.   Cardiovascular: Positive for leg swelling. Negative for palpitations.  Gastrointestinal: Negative for nausea and vomiting.  Genitourinary: Negative for dysuria.  Musculoskeletal: Positive for joint swelling.  Skin: Negative for rash.  Neurological: Negative for headaches.  Hematological: Bruises/bleeds easily.  Psychiatric/Behavioral: Negative for dysphoric mood. The patient is not nervous/anxious.        Objective:   Physical Exam Overweight female in no acute distress Nose without purulence or discharge noted Chest with decreased breath sounds, mild basilar crackles Cardiac exam was regular rate and rhythm Lower extremities with edema, no cyanosis Alert and oriented, moves all 4 extremities.       Assessment & Plan:

## 2011-10-18 NOTE — Patient Instructions (Addendum)
Stay on advair twice a day everyday Take spiriva everyday each am. Will refer to pulmonary rehab at [facility] on weight reduction. Will see you back in 4mos, but call in 3-4 weeks if your chest tightness is not improved.

## 2011-10-20 ENCOUNTER — Telehealth: Payer: Self-pay | Admitting: *Deleted

## 2011-10-20 NOTE — Telephone Encounter (Signed)
Called Ms. Montez Morita to schedule her mom Cardiac CT. Ms. Montez Morita wants to know if her mom still need CTA. Per daughter ,  Dr. Shelle Iron states her 25 was low. He put her on oxygen/Inhalers. She thinks her mom is doing much better and doesn't need Cardiac CT at this time. Per Dr. Shirlee Latch, patient can hold off on having Cardiac CT. But at anytime the patient decides she wants to have it done. She is to call the office and let Thurston Hole know.

## 2011-10-25 NOTE — Telephone Encounter (Signed)
Pt was seen on 10/18/2011 by [facility]

## 2011-10-26 ENCOUNTER — Ambulatory Visit: Payer: Medicare Other | Admitting: Pulmonary Disease

## 2011-10-28 ENCOUNTER — Encounter: Payer: Self-pay | Admitting: Pulmonary Disease

## 2011-11-15 ENCOUNTER — Encounter: Payer: Self-pay | Admitting: Internal Medicine

## 2011-12-28 ENCOUNTER — Ambulatory Visit (HOSPITAL_COMMUNITY): Payer: Medicare Other

## 2012-01-03 ENCOUNTER — Encounter (HOSPITAL_COMMUNITY)
Admission: RE | Admit: 2012-01-03 | Discharge: 2012-01-03 | Disposition: A | Discharge status: Home / Self Care | Payer: Medicare Other | Source: Ambulatory Visit | Attending: Pulmonary Disease | Admitting: Pulmonary Disease

## 2012-01-03 ENCOUNTER — Telehealth: Payer: Self-pay | Admitting: Pulmonary Disease

## 2012-01-03 DIAGNOSIS — Z5189 Encounter for other specified aftercare: Secondary | ICD-10-CM | POA: Insufficient documentation

## 2012-01-03 DIAGNOSIS — R0989 Other specified symptoms and signs involving the circulatory and respiratory systems: Secondary | ICD-10-CM | POA: Insufficient documentation

## 2012-01-03 DIAGNOSIS — R0609 Other forms of dyspnea: Secondary | ICD-10-CM | POA: Insufficient documentation

## 2012-01-03 DIAGNOSIS — R0602 Shortness of breath: Secondary | ICD-10-CM

## 2012-01-03 NOTE — Telephone Encounter (Signed)
yes

## 2012-01-03 NOTE — Telephone Encounter (Signed)
Called, spoke with [name] with [facility].  Pt was referred to pulm rehab for COPD.  Per [name], her PFT numbers do not qualify for Medicare to pay for [facility].  [name] states if we can get a new order sent with a dx of SOB, Medicare will then pay and pt can come back for Rehab on Tuesday.  Dr. Shelle Iron, are you ok with changing the dx code for pulm rehab from copd to SOB?

## 2012-01-03 NOTE — Progress Notes (Signed)
Mrs. Cammarata came today for orientation to Pulmonary Rehab.  We have some questions about her Pulmonary Function Test results and her Physician Order and Treatment Plan.  We will contacted Dr. Teddy Spike office and will delay her orientation until we have reviewed with him.  Explanation to patient and her daughter.  We will reschedule for Tuesday, Nov 26.    Cathie Olden RN

## 2012-01-04 NOTE — Telephone Encounter (Signed)
Called, spoke with Toni Amend with Pulm Rehab -- Phylis is off this am.  Toni Amend is aware [facility] ok with changing dx code for Pulm Rehab from COPD to SOB and new order has been placed for this.  Toni Amend also states an order form needs to be filled out for this.  She will fax form to triage and asks to make sure dx and ICD code is on this as well as the PFT numbers.  Will await fax.

## 2012-01-05 NOTE — Telephone Encounter (Signed)
Hailey [name] has KC received this fax yet from [employer]? See message below.   Please advise. Thanks.

## 2012-01-05 NOTE — Telephone Encounter (Signed)
Looked for forms in kc box they was not there. Called and spoke to Cecilia and they will refax forms. Will put in kc's look at

## 2012-01-06 ENCOUNTER — Telehealth: Payer: Self-pay | Admitting: Pulmonary Disease

## 2012-01-06 MED ORDER — FLUTICASONE-SALMETEROL 100-50 MCG/DOSE IN AEPB: 1.0000 | INHALATION_SPRAY | Freq: Two times a day (BID) | RESPIRATORY_TRACT | Status: DC

## 2012-01-06 NOTE — Telephone Encounter (Signed)
Called and spoke with pt and she is aware that 1 sample has been left up front for her.  She is aware that i also left the forms for pt assistance for her to fill out and bring back to the office.  Pt voiced her understanding and nothing further is needed.

## 2012-01-06 NOTE — Telephone Encounter (Signed)
I spoke with Hailey [name] and she stated she has reached the donut hole and wanted to know if she can have a sample of the advair. She stated it is $100 right now. Please advise KC thanks

## 2012-01-06 NOTE — Telephone Encounter (Signed)
She can have ONE sample of advair, and see if she may be eligible to apply for any of the pt assistance programs.

## 2012-01-09 NOTE — Telephone Encounter (Signed)
Kim, did you ever receive the forms from pulm rehab on this pt?

## 2012-01-10 ENCOUNTER — Encounter (HOSPITAL_COMMUNITY)
Admission: RE | Admit: 2012-01-10 | Discharge: 2012-01-10 | Disposition: A | Discharge status: Home / Self Care | Payer: Medicare Other | Source: Ambulatory Visit | Attending: Pulmonary Disease | Admitting: Pulmonary Disease

## 2012-01-10 ENCOUNTER — Encounter (HOSPITAL_COMMUNITY): Payer: Self-pay

## 2012-01-11 NOTE — Telephone Encounter (Signed)
I do not have forms

## 2012-01-11 NOTE — Telephone Encounter (Signed)
KC did you ever receive forms? Please advise. If not will have forms refaxed. thanks

## 2012-01-11 NOTE — Telephone Encounter (Signed)
Called [employer], spoke with [place].  She will have forms re-faxed to triage Attn: Lawson Fiscal.  Will route to [place] to f/u on.

## 2012-01-11 NOTE — Telephone Encounter (Signed)
Hailey [name] called back.  States she will fax order form to triage.  Will forward to [place] to f/u on.

## 2012-01-17 ENCOUNTER — Encounter (HOSPITAL_COMMUNITY)
Admission: RE | Admit: 2012-01-17 | Discharge: 2012-01-17 | Disposition: A | Discharge status: Home / Self Care | Payer: Medicare Other | Source: Ambulatory Visit | Attending: Pulmonary Disease | Admitting: Pulmonary Disease

## 2012-01-17 DIAGNOSIS — R0989 Other specified symptoms and signs involving the circulatory and respiratory systems: Secondary | ICD-10-CM | POA: Insufficient documentation

## 2012-01-17 DIAGNOSIS — R0609 Other forms of dyspnea: Secondary | ICD-10-CM | POA: Insufficient documentation

## 2012-01-17 DIAGNOSIS — Z5189 Encounter for other specified aftercare: Secondary | ICD-10-CM | POA: Insufficient documentation

## 2012-01-17 NOTE — Telephone Encounter (Signed)
I spoke with [name] and she stated that she has everything she needs for this patient. She finally received the PFT's that were needed. Will close note since nothing further is needed from Dr. Shelle Iron at this time.

## 2012-01-19 ENCOUNTER — Encounter (HOSPITAL_COMMUNITY)
Admission: RE | Admit: 2012-01-19 | Discharge: 2012-01-19 | Disposition: A | Discharge status: Home / Self Care | Payer: Medicare Other | Source: Ambulatory Visit | Attending: Pulmonary Disease | Admitting: Pulmonary Disease

## 2012-01-19 NOTE — Progress Notes (Signed)
Completed home exercise with patient. Reviewed exercise progression, routine, exercising at a comfortable pace, RPE/Dyspnea scales, how important it is to own a pulse oximeter and how to use one, weather conditions, warning signs and symptoms with exercise, and CP/NTG. We discussed when to call MD. Patient voices understanding. Patient has a goal to become more active, join a senior center, make some new friends. Will continue to encourage and support.  [id number]

## 2012-01-24 ENCOUNTER — Encounter (HOSPITAL_COMMUNITY)
Admission: RE | Admit: 2012-01-24 | Discharge: 2012-01-24 | Disposition: A | Discharge status: Home / Self Care | Payer: Medicare Other | Source: Ambulatory Visit | Attending: Pulmonary Disease | Admitting: Pulmonary Disease

## 2012-01-26 ENCOUNTER — Encounter (HOSPITAL_COMMUNITY)
Admission: RE | Admit: 2012-01-26 | Discharge: 2012-01-26 | Disposition: A | Discharge status: Home / Self Care | Payer: Medicare Other | Source: Ambulatory Visit | Attending: Pulmonary Disease | Admitting: Pulmonary Disease

## 2012-01-26 ENCOUNTER — Encounter (HOSPITAL_COMMUNITY): Admission: RE | Admit: 2012-01-26 | Payer: Medicare Other | Source: Ambulatory Visit

## 2012-01-31 ENCOUNTER — Encounter (HOSPITAL_COMMUNITY): Payer: Medicare Other

## 2012-02-02 ENCOUNTER — Encounter (HOSPITAL_COMMUNITY): Payer: Medicare Other

## 2012-02-07 ENCOUNTER — Encounter (HOSPITAL_COMMUNITY): Payer: Medicare Other

## 2012-02-09 ENCOUNTER — Encounter (HOSPITAL_COMMUNITY): Payer: Medicare Other

## 2012-02-14 ENCOUNTER — Encounter (HOSPITAL_COMMUNITY): Payer: Medicare Other

## 2012-02-14 ENCOUNTER — Encounter (HOSPITAL_COMMUNITY)
Admission: RE | Admit: 2012-02-14 | Discharge: 2012-02-14 | Disposition: A | Discharge status: Home / Self Care | Payer: Medicare Other | Source: Ambulatory Visit | Attending: Pulmonary Disease | Admitting: Pulmonary Disease

## 2012-02-16 ENCOUNTER — Encounter (HOSPITAL_COMMUNITY): Payer: Medicare Other

## 2012-02-16 ENCOUNTER — Encounter (HOSPITAL_COMMUNITY)
Admission: RE | Admit: 2012-02-16 | Discharge: 2012-02-16 | Disposition: A | Discharge status: Home / Self Care | Payer: Medicare Other | Source: Ambulatory Visit | Attending: Pulmonary Disease | Admitting: Pulmonary Disease

## 2012-02-16 DIAGNOSIS — Z5189 Encounter for other specified aftercare: Secondary | ICD-10-CM | POA: Insufficient documentation

## 2012-02-16 DIAGNOSIS — R0989 Other specified symptoms and signs involving the circulatory and respiratory systems: Secondary | ICD-10-CM | POA: Insufficient documentation

## 2012-02-16 DIAGNOSIS — R0609 Other forms of dyspnea: Secondary | ICD-10-CM | POA: Insufficient documentation

## 2012-02-17 ENCOUNTER — Ambulatory Visit: Payer: Medicare Other | Admitting: Pulmonary Disease

## 2012-02-21 ENCOUNTER — Encounter (HOSPITAL_COMMUNITY)
Admission: RE | Admit: 2012-02-21 | Discharge: 2012-02-21 | Disposition: A | Discharge status: Home / Self Care | Payer: Medicare Other | Source: Ambulatory Visit | Attending: Pulmonary Disease | Admitting: Pulmonary Disease

## 2012-02-21 ENCOUNTER — Encounter (HOSPITAL_COMMUNITY): Payer: Medicare Other

## 2012-02-23 ENCOUNTER — Encounter (HOSPITAL_COMMUNITY)
Admission: RE | Admit: 2012-02-23 | Discharge: 2012-02-23 | Disposition: A | Discharge status: Home / Self Care | Payer: Medicare Other | Source: Ambulatory Visit | Attending: Pulmonary Disease | Admitting: Pulmonary Disease

## 2012-02-23 ENCOUNTER — Encounter (HOSPITAL_COMMUNITY): Payer: Medicare Other

## 2012-02-28 ENCOUNTER — Encounter (HOSPITAL_COMMUNITY): Payer: Medicare Other

## 2012-02-28 ENCOUNTER — Encounter (HOSPITAL_COMMUNITY)
Admission: RE | Admit: 2012-02-28 | Discharge: 2012-02-28 | Disposition: A | Discharge status: Home / Self Care | Payer: Medicare Other | Source: Ambulatory Visit | Attending: Pulmonary Disease | Admitting: Pulmonary Disease

## 2012-03-01 ENCOUNTER — Encounter (HOSPITAL_COMMUNITY)
Admission: RE | Admit: 2012-03-01 | Discharge: 2012-03-01 | Disposition: A | Discharge status: Home / Self Care | Payer: Medicare Other | Source: Ambulatory Visit | Attending: Pulmonary Disease | Admitting: Pulmonary Disease

## 2012-03-01 ENCOUNTER — Encounter (HOSPITAL_COMMUNITY): Payer: Medicare Other

## 2012-03-06 ENCOUNTER — Encounter (HOSPITAL_COMMUNITY)
Admission: RE | Admit: 2012-03-06 | Discharge: 2012-03-06 | Disposition: A | Discharge status: Home / Self Care | Payer: Medicare Other | Source: Ambulatory Visit | Attending: Pulmonary Disease | Admitting: Pulmonary Disease

## 2012-03-06 ENCOUNTER — Encounter (HOSPITAL_COMMUNITY): Payer: Medicare Other

## 2012-03-08 ENCOUNTER — Encounter (HOSPITAL_COMMUNITY)
Admission: RE | Admit: 2012-03-08 | Discharge: 2012-03-08 | Disposition: A | Discharge status: Home / Self Care | Payer: Medicare Other | Source: Ambulatory Visit | Attending: Pulmonary Disease | Admitting: Pulmonary Disease

## 2012-03-08 ENCOUNTER — Encounter (HOSPITAL_COMMUNITY): Payer: Medicare Other

## 2012-03-13 ENCOUNTER — Encounter (HOSPITAL_COMMUNITY): Payer: Medicare Other

## 2012-03-15 ENCOUNTER — Encounter (HOSPITAL_COMMUNITY): Payer: Medicare Other

## 2012-03-15 ENCOUNTER — Encounter (HOSPITAL_COMMUNITY)
Admission: RE | Admit: 2012-03-15 | Payer: Medicare Other | Source: Ambulatory Visit | Attending: Pulmonary Disease | Admitting: Pulmonary Disease

## 2012-03-20 ENCOUNTER — Encounter (HOSPITAL_COMMUNITY)
Admission: RE | Admit: 2012-03-20 | Discharge: 2012-03-20 | Disposition: A | Discharge status: Home / Self Care | Payer: Medicare Other | Source: Ambulatory Visit | Attending: Pulmonary Disease | Admitting: Pulmonary Disease

## 2012-03-20 ENCOUNTER — Encounter (HOSPITAL_COMMUNITY): Payer: Medicare Other

## 2012-03-20 DIAGNOSIS — R0609 Other forms of dyspnea: Secondary | ICD-10-CM | POA: Insufficient documentation

## 2012-03-20 DIAGNOSIS — Z5189 Encounter for other specified aftercare: Secondary | ICD-10-CM | POA: Insufficient documentation

## 2012-03-20 DIAGNOSIS — R0989 Other specified symptoms and signs involving the circulatory and respiratory systems: Secondary | ICD-10-CM | POA: Insufficient documentation

## 2012-03-22 ENCOUNTER — Encounter (HOSPITAL_COMMUNITY)
Admission: RE | Admit: 2012-03-22 | Discharge: 2012-03-22 | Disposition: A | Discharge status: Home / Self Care | Payer: Medicare Other | Source: Ambulatory Visit | Attending: Pulmonary Disease | Admitting: Pulmonary Disease

## 2012-03-22 ENCOUNTER — Encounter (HOSPITAL_COMMUNITY): Payer: Medicare Other

## 2012-03-22 NOTE — Progress Notes (Signed)
Hailey [name] 78 y.o. female Nutrition Note Spoke with pt. Pt is obese. Pt wants to lose wt and reports she may join Weight Watcher's with her daughter. Pt c/o feeling bloated frequently. How to determine what food may be causing bloating discussed. Pt encouraged to add a Probiotic daily if eliminating gassy foods does not work to help with bloating. Most meals prepared at home by pt's daughter. There are some ways the pt can make her eating habits healthier. Pt's Rate Your Plate results reviewed with pt.  Pt expressed understanding. Pt avoids many salty food; uses No Added Salt canned food.  Pt has changed to not adding salt to food at the table.  The role of sodium in lung disease reviewed with pt.  Nutrition Diagnosis   Food-and nutrition-related knowledge deficit related to lack of exposure to information as related to diagnosis of pulmonary disease   Overweight/obesity related to excessive energy intake as evidenced by a BMI of 33.7 Nutrition Rx/Est. Daily Nutrition Needs for: ? wt loss 1200-1500 Kcal  70-85 gm protein   1500 mg or less sodium      Nutrition Intervention   Pt's individual nutrition plan and goals reviewed with pt.   Benefits of adopting healthy eating habits discussed when pt's Rate Your Plate reviewed.   Pt to attend the Nutrition and Lung Disease class -met 03/08/12   Continual client-centered nutrition education by RD, as part of interdisciplinary care. Goal(s) 1. Identify food quantities necessary to achieve wt loss of  -2# per week to a goal wt of 74.5-82.7 kg (164-182 lb) at graduation from pulmonary rehab. 2. Describe the benefit of including fruits, vegetables, whole grains, and low-fat dairy products in a healthy meal plan. Monitor and Evaluate progress toward nutrition goal with team. Nutrition Risk: Moderate

## 2012-03-27 ENCOUNTER — Telehealth: Payer: Self-pay | Admitting: Cardiology

## 2012-03-27 ENCOUNTER — Encounter (HOSPITAL_COMMUNITY): Payer: Medicare Other

## 2012-03-27 ENCOUNTER — Encounter (HOSPITAL_COMMUNITY)
Admission: RE | Admit: 2012-03-27 | Discharge: 2012-03-27 | Disposition: A | Discharge status: Home / Self Care | Payer: Medicare Other | Source: Ambulatory Visit | Attending: Pulmonary Disease | Admitting: Pulmonary Disease

## 2012-03-27 NOTE — Progress Notes (Signed)
Norma Fredrickson ANP reviewed ECG tracings.  Juliette Alcide gave patients daughter instructions via telephone to decrease metoprolol to 25 mg twice a day. Patient and daughter left pulmonary rehab without complaints

## 2012-03-27 NOTE — Telephone Encounter (Signed)
With exercise she did get up to 50's. Patient has had some increased fatigue. Strips sent by Hilda Lias at [facility] and reviewed by Lawson Fiscal.  Will have patient decrease her Metoprolol 50 mg twice a day to 1/2 tablet twice a day, follow up with Dr Shirlee Latch soon. Advised daughter. Will call her back to schedule follow up appointment

## 2012-03-27 NOTE — Telephone Encounter (Signed)
Pt's heart rate 46 today, maria will sen fax over today, pls call [phone]

## 2012-03-27 NOTE — Telephone Encounter (Signed)
Left message for daughter to call back.  

## 2012-03-27 NOTE — Progress Notes (Addendum)
Hailey [name]'s resting heart rate was noted at 46 this am upon entry to Pulmonary rehab.  Hailey [name] complained of feeling more tired the past two weeks otherwise patient asymptomatic. Blood pressure 104/60.  Patient placed on Zoll questionable Sinus brady 48 versus Junctional rhythm. P wave is small and hard to visualize.  Faxed Zoll tracings to Hailey [name] office.  Spoke with triage nurse Hailey [name] about brady cardia. Hailey [name] exercised at Pulmonary rehab without difficulty.

## 2012-03-27 NOTE — Telephone Encounter (Signed)
Scheduled appointment for Friday at 1:45, advised daughter

## 2012-03-29 ENCOUNTER — Encounter (HOSPITAL_COMMUNITY): Payer: Medicare Other

## 2012-03-30 ENCOUNTER — Ambulatory Visit: Payer: Medicare Other | Admitting: Cardiology

## 2012-04-03 ENCOUNTER — Encounter (HOSPITAL_COMMUNITY)
Admission: RE | Admit: 2012-04-03 | Discharge: 2012-04-03 | Disposition: A | Discharge status: Home / Self Care | Payer: Medicare Other | Source: Ambulatory Visit | Attending: Pulmonary Disease | Admitting: Pulmonary Disease

## 2012-04-03 ENCOUNTER — Telehealth: Payer: Self-pay | Admitting: Cardiology

## 2012-04-03 ENCOUNTER — Encounter (HOSPITAL_COMMUNITY): Payer: Medicare Other

## 2012-04-03 NOTE — Telephone Encounter (Signed)
Spoke with daughter. Appt made for 04/13/12 with Dr Shirlee Latch.

## 2012-04-03 NOTE — Telephone Encounter (Signed)
Pt was scheduled for 2/14 due to issues at cardiac rehab and change in medication and Dr. Alford Highland next avail is 3/24 and daughter states that is too far away and I offered Hailey [name] and daughter wanted me to send a message

## 2012-04-05 ENCOUNTER — Encounter (HOSPITAL_COMMUNITY): Payer: Medicare Other

## 2012-04-05 ENCOUNTER — Encounter (HOSPITAL_COMMUNITY)
Admission: RE | Admit: 2012-04-05 | Discharge: 2012-04-05 | Disposition: A | Discharge status: Home / Self Care | Payer: Medicare Other | Source: Ambulatory Visit | Attending: Pulmonary Disease | Admitting: Pulmonary Disease

## 2012-04-10 ENCOUNTER — Encounter (HOSPITAL_COMMUNITY)
Admission: RE | Admit: 2012-04-10 | Discharge: 2012-04-10 | Disposition: A | Discharge status: Home / Self Care | Payer: Medicare Other | Source: Ambulatory Visit | Attending: Pulmonary Disease | Admitting: Pulmonary Disease

## 2012-04-10 ENCOUNTER — Encounter (HOSPITAL_COMMUNITY): Payer: Medicare Other

## 2012-04-12 ENCOUNTER — Encounter (HOSPITAL_COMMUNITY): Payer: Medicare Other

## 2012-04-12 ENCOUNTER — Encounter (HOSPITAL_COMMUNITY)
Admission: RE | Admit: 2012-04-12 | Discharge: 2012-04-12 | Disposition: A | Discharge status: Home / Self Care | Payer: Medicare Other | Source: Ambulatory Visit | Attending: Pulmonary Disease | Admitting: Pulmonary Disease

## 2012-04-13 ENCOUNTER — Encounter: Payer: Self-pay | Admitting: Cardiology

## 2012-04-13 ENCOUNTER — Ambulatory Visit (INDEPENDENT_AMBULATORY_CARE_PROVIDER_SITE_OTHER): Payer: Medicare Other | Admitting: Cardiology

## 2012-04-13 VITALS — BP 128/64 | HR 55 | Ht 63.0 in | Wt 190.0 lb

## 2012-04-13 DIAGNOSIS — J449 Chronic obstructive pulmonary disease, unspecified: Secondary | ICD-10-CM

## 2012-04-13 DIAGNOSIS — R079 Chest pain, unspecified: Secondary | ICD-10-CM

## 2012-04-13 DIAGNOSIS — I421 Obstructive hypertrophic cardiomyopathy: Secondary | ICD-10-CM

## 2012-04-13 DIAGNOSIS — R0602 Shortness of breath: Secondary | ICD-10-CM

## 2012-04-13 LAB — BASIC METABOLIC PANEL
BUN: 15 mg/dL (ref 6–23)
Calcium: 9.7 mg/dL (ref 8.4–10.5)
Creatinine, Ser: 0.9 mg/dL (ref 0.4–1.2)

## 2012-04-13 LAB — BRAIN NATRIURETIC PEPTIDE: Pro B Natriuretic peptide (BNP): 70 pg/mL (ref 0.0–100.0)

## 2012-04-13 NOTE — Patient Instructions (Addendum)
Your physician recommends that you return for lab work today--BMET/BNP.   Your physician has requested that you have a lexiscan myoview. For further information please visit [email or web address] Please follow instruction sheet, as given.  Your physician wants you to follow-up in: 6 months with Dr Shirlee Latch. (August 2014).  You will receive a reminder letter in the mail two months in advance. If you don't receive a letter, please call our office to schedule the follow-up appointment.

## 2012-04-15 NOTE — Progress Notes (Signed)
Patient ID: Hailey [name], female   DOB: 01-Jan-1933, 79 y.o.   MRN: [id number] PCP: Dr. Randa Lynn  79 yo with history of hypertrophic cardiomyopathy, GI bleeding, venous thromboembolism, and COPD presents for cardiology followup.  Echo in 4/09 showed asymmetry basal septal hypertrophy with an LVOT gradient to 130 mmHg with valsalva.  She was started on Toprol XL based on this finding.  She also had a cath in 2009 without significant CAD.  Repeat echo in 3/12 on beta blocker showed mild focal basal septal hypertrophy, EF 60-65%, no significant LV outflow tract gradient, and no mitral valve SAM.  Lexiscan myoview in 9/12 showed no ischemia or infarction.  Holter showed rare PVCs and a run of atrial tachycardia.  Echo in 7/13 showed normal EF, mild MR, and no significant LVOT gradient.  She is chronically short of breath after walking about 100 feet or with climbing a flight of steps.  She continues to be fairly inactive.  She reports central chest pain radiating up her neck.  This occurs with exertion 2-3 times a week.  She can be just walking around her house.  Symptoms resolve with rest.  This pattern has been present for at least 3-4 years.  She has also been having a different kind of chest pain that radiates to her left arm.  This has been present for a few months.  This is also exertional such as when she pushes a cart in the grocery store.  Last cath in 2009 showed normal coronaries.   She is now doing pulmonary rehab.  She thinks it has helped her breathing a bit.  She is on oxygen at night.   Labs (7/13): TSH normal Labs (8/13): K 4, creatinine 0.8  Allergies (verified):  1)  ! Toradol 2)  ! Macrodantin 3)  ! Codeine 4)  ! Pyridium  Past Medical History: 1. LLE DVT - August 2010, recurrent hx.  Has IVC filter. Not on coumadin now with history of GI bleeding.  2. HYPERLIPIDEMIA (ICD-272.4) 3. GASTROESOPHAGEAL REFLUX DISEASE (ICD-530.81) 4. Hypertrophic cardiomyopathy: Echo (4/09) with EF 70-75%,  asymmetric basal septal hypertrophy, mild MR without systolic anterior motion of the mitral valve, LVOT gradient to 130 mmHg with Valsalva.  This echo was not done on a beta blocker.  Echo in 3/12 on Toprol XL showed EF 60-65%, mild focal basal hypertrophy, no LVOT gradient, no SAM, moderate diastolic dysfunction, mild MR.  Echo (7/13): EF 55-60%, no SAM or LVOT gradient, mild MR, PASP 34 mmHg.  5. ANEMIA, CHRONIC (ICD-281.9) 6. OSTEOARTHRITIS (ICD-715.90): History of left TKR 7. FIBROMYALGIA (ICD-729.1) 8. Hx of CEREBRAL ANEURYSM (ICD-437.3) 9. Hx of MIGRAINE HEADACHE (ICD-346.90) 10. HYPERTENSION (ICD-401.9) 11. COPD 12. Left heart cath 2009 without significant CAD.  Myoview (2/11) with EF 72%, no ischemia.  Lexiscan myoview (9/12): EF 82%, no ischemia or infarction.  13. PUD with GI bleeding: not on ASA 14. Atrial tachycardia: Patient had short runs of atrial tachycardia on 9/12 holter.  Only rare PVCs.  15. Carotid dopplers (8/13) with minimal disease bilaterally.   Family History: Family History of Colon Cancer:FATHER,BROTHER Family History of Pancreatic Cancer:MOTHER Family History of Colon Polyps:SIBLINJGS, FATHER Family History of Heart Disease: FATHER, SISTERS  Social History: Occupation: RETIRED Quit smoking in 11/10 Alcohol Use - yes-SOCIALLY Daily Caffeine Use: 1 CUP COFFEE Illicit Drug Use - no Patient does not get regular exercise.  Lives in [place] with daughter  Review of Systems        All systems reviewed  and negative except as per HPI.   Current Outpatient Prescriptions  Medication Sig Dispense Refill  . Acetaminophen (TYLENOL EXTRA STRENGTH PO) Take by mouth as needed.      . citalopram (CELEXA) 40 MG tablet Take 40 mg by mouth daily.        Marland Kitchen dimenhyDRINATE (DRAMAMINE) 50 MG tablet Take 50 mg by mouth every 8 (eight) hours as needed.        . Fluticasone-Salmeterol (ADVAIR DISKUS) 100-50 MCG/DOSE AEPB Inhale 1 puff into the lungs every 12 (twelve) hours.   14 each  0  . gabapentin (NEURONTIN) 100 MG capsule Take 200 mg by mouth 2 (two) times daily.       Marland Kitchen loratadine (CLARITIN) 10 MG tablet Take 10 mg by mouth daily.      Marland Kitchen lovastatin (MEVACOR) 40 MG tablet Take 40 mg by mouth daily.        . Melatonin 3 MG TABS Take 1 tablet by mouth at bedtime.        . metoprolol (LOPRESSOR) 50 MG tablet Take 25 mg by mouth 2 (two) times daily.      . pantoprazole (PROTONIX) 40 MG tablet Take 40 mg by mouth daily.      . Polyethylene Glycol 3350 POWD        . pramipexole (MIRAPEX) 0.25 MG tablet Take 1 tablet by mouth daily.      . sucralfate (CARAFATE) 1 G tablet       . tiotropium (SPIRIVA) 18 MCG inhalation capsule Place 18 mcg into inhaler and inhale as needed.        . torsemide (DEMADEX) 20 MG tablet Take 20 mg by mouth 2 (two) times daily.       . traMADol (ULTRAM) 50 MG tablet Take 50 mg by mouth 4 (four) times daily.        No current facility-administered medications for this visit.    BP 128/64  Pulse 55  Ht 5\' 3"  (1.6 m)  Wt 190 lb (86.183 kg)  BMI 33.67 kg/m2 General:  Well developed, well nourished, in no acute distress. Neck:  Neck supple, no JVD. No masses, thyromegaly or abnormal cervical nodes. Lungs:  Clear bilaterally to auscultation and percussion. Heart:  Non-displaced PMI, chest non-tender; regular rate and rhythm, S1, S2 without rubs or gallops. No murmur.  Carotid upstroke normal, no bruit.  Pedals normal pulses. No edema, no varicosities. Abdomen:  Bowel sounds positive; abdomen soft and non-tender without masses, organomegaly, or hernias noted. No hepatosplenomegaly. Extremities:  No clubbing or cyanosis. Neurologic:  Alert and oriented x 3. Psych:  Normal affect.  Assessment/Plan: 1. Hypertrophic cardiomyopathy: Last echo showed no SAM or LVOT obstruction.  This seems to be well-controlled by beta blocker.  Continue metoprolol with no change.  I do not think that HCM explains her chest pain.  2. Chest pain: She has had a  long pattern of chest pain with a negative cath in 2009 and negative myoview in 2012.  More recently, she has developed a new type of pain that worries her.  This pain is exertional (though her more chronic chest pain pattern has been exertional as well.  - I will get a Lexiscan Sestamibi - She does not take ASA due to GI bleeding.  3. COPD: She is on oxygen at night.  She is doing pulmonary rehab which seems to helped some. 4. Exertional dyspnea: I think this is due to a mixture of COPD and deconditioning/inactivity.  I will get  a BNP. She is on torsemide and does not appear volume overloaded.  Will get BMET given torsemide use.   Marca Ancona 04/15/2012

## 2012-04-17 ENCOUNTER — Encounter (HOSPITAL_COMMUNITY): Payer: Medicare Other

## 2012-04-18 ENCOUNTER — Ambulatory Visit: Payer: Medicare Other | Admitting: Pulmonary Disease

## 2012-04-19 ENCOUNTER — Encounter (HOSPITAL_COMMUNITY)
Admission: RE | Admit: 2012-04-19 | Discharge: 2012-04-19 | Disposition: A | Discharge status: Home / Self Care | Payer: Medicare Other | Source: Ambulatory Visit | Attending: Pulmonary Disease | Admitting: Pulmonary Disease

## 2012-04-19 ENCOUNTER — Encounter (HOSPITAL_COMMUNITY): Payer: Medicare Other

## 2012-04-19 DIAGNOSIS — R0989 Other specified symptoms and signs involving the circulatory and respiratory systems: Secondary | ICD-10-CM | POA: Insufficient documentation

## 2012-04-19 DIAGNOSIS — R0609 Other forms of dyspnea: Secondary | ICD-10-CM | POA: Insufficient documentation

## 2012-04-19 DIAGNOSIS — Z5189 Encounter for other specified aftercare: Secondary | ICD-10-CM | POA: Insufficient documentation

## 2012-04-20 ENCOUNTER — Telehealth: Payer: Self-pay | Admitting: Pulmonary Disease

## 2012-04-20 DIAGNOSIS — J209 Acute bronchitis, unspecified: Secondary | ICD-10-CM

## 2012-04-20 MED ORDER — AZITHROMYCIN 250 MG PO TABS: ORAL_TABLET | ORAL | Status: DC

## 2012-04-20 NOTE — Telephone Encounter (Signed)
C/o green phlegm, feeling bad, no fever Z-pak Stop celexa while on this

## 2012-04-24 ENCOUNTER — Encounter (HOSPITAL_COMMUNITY): Payer: Medicare Other

## 2012-04-25 ENCOUNTER — Encounter (HOSPITAL_COMMUNITY): Payer: Medicare Other

## 2012-04-26 ENCOUNTER — Encounter (HOSPITAL_COMMUNITY): Payer: Medicare Other

## 2012-04-27 ENCOUNTER — Encounter: Payer: Self-pay | Admitting: Cardiology

## 2012-05-01 ENCOUNTER — Encounter (HOSPITAL_COMMUNITY): Payer: Medicare Other

## 2012-05-01 ENCOUNTER — Ambulatory Visit (HOSPITAL_COMMUNITY): Payer: Medicare Other | Attending: Cardiology | Admitting: Radiology

## 2012-05-01 VITALS — BP 114/57 | HR 49 | Ht 63.0 in | Wt 189.0 lb

## 2012-05-01 DIAGNOSIS — R079 Chest pain, unspecified: Secondary | ICD-10-CM

## 2012-05-01 DIAGNOSIS — R0609 Other forms of dyspnea: Secondary | ICD-10-CM | POA: Insufficient documentation

## 2012-05-01 DIAGNOSIS — R0789 Other chest pain: Secondary | ICD-10-CM | POA: Insufficient documentation

## 2012-05-01 DIAGNOSIS — R42 Dizziness and giddiness: Secondary | ICD-10-CM | POA: Insufficient documentation

## 2012-05-01 DIAGNOSIS — R0989 Other specified symptoms and signs involving the circulatory and respiratory systems: Secondary | ICD-10-CM | POA: Insufficient documentation

## 2012-05-01 DIAGNOSIS — R0602 Shortness of breath: Secondary | ICD-10-CM

## 2012-05-01 DIAGNOSIS — R002 Palpitations: Secondary | ICD-10-CM | POA: Insufficient documentation

## 2012-05-01 DIAGNOSIS — I10 Essential (primary) hypertension: Secondary | ICD-10-CM | POA: Insufficient documentation

## 2012-05-01 MED ORDER — AMINOPHYLLINE 25 MG/ML IV SOLN
75.0000 mg | Freq: Once | INTRAVENOUS | Status: AC
  Administered 2012-05-01: 75 mg via INTRAVENOUS

## 2012-05-01 MED ORDER — REGADENOSON 0.4 MG/5ML IV SOLN
0.4000 mg | Freq: Once | INTRAVENOUS | Status: AC
  Administered 2012-05-01: 0.4 mg via INTRAVENOUS

## 2012-05-01 MED ORDER — TECHNETIUM TC 99M SESTAMIBI GENERIC - CARDIOLITE
33.0000 | Freq: Once | INTRAVENOUS | Status: AC | PRN
  Administered 2012-05-01: 33 via INTRAVENOUS

## 2012-05-01 MED ORDER — TECHNETIUM TC 99M SESTAMIBI GENERIC - CARDIOLITE
10.8000 | Freq: Once | INTRAVENOUS | Status: AC | PRN
  Administered 2012-05-01: 11 via INTRAVENOUS

## 2012-05-01 NOTE — Progress Notes (Addendum)
[facility] SITE 3 NUCLEAR MED [address] [place], [place] [address] [phone]    Cardiology Nuclear Med Study  Hailey [name] is a 79 y.o. female     MRN : [id number]     DOB: 1932/04/16  Procedure Date: 05/01/2012  Nuclear Med Background Indication for Stress Test:  Evaluation for Ischemia History:  '09 Cath:normal coronaries; '12 QMV:HQIONG, EF=82%; 7/13 Echo:EF=60%, mild MR; h/o HCM, PAT and MVP Cardiac Risk Factors: Carotid Disease, Family History - CAD, History of Smoking, Hypertension, Lipids and Obesity  Symptoms:  Chest Pain/Tightness with and without Exertion (last episode of chest discomfort was yesterday), Diaphoresis, Dizziness, Chronic SOB/DOE, Fatigue, Near Syncope, Palpitations and Rapid HR   Nuclear Pre-Procedure Caffeine/Decaff Intake:  None > 12 hrs NPO After: 7:30pm   Lungs:  Clear. O2 Sat: 98% on room air. IV 0.9% NS with Angio Cath:  22g  IV Site: R Hand x 1, tolerated well IV Started by:  Irean Hong, RN  Chest Size (in):  42 Cup Size: C  Height: 5\' 3"  (1.6 m)  Weight:  189 lb (85.73 kg)  BMI:  Body mass index is 33.49 kg/(m^2). Tech Comments:  Lopressor and Demadex held this am    Nuclear Med Study 1 or 2 day study: 1 day  Stress Test Type:  Lexiscan  Reading MD: Cassell Clement, MD  Order Authorizing Provider:  Marca Ancona, MD  Resting Radionuclide: Technetium 92m Sestamibi  Resting Radionuclide Dose: 11.0 mCi   Stress Radionuclide:  Technetium 63m Sestamibi  Stress Radionuclide Dose: 33.0 mCi           Stress Protocol Rest HR: 49 Stress HR: 57  Rest BP: 114/57 Stress BP: 120/58  Exercise Time (min): n/a METS: n/a   Predicted Max HR: 143 bpm % Max HR: 39.86 bpm Rate Pressure Product: 6840   Dose of Adenosine (mg):  n/a Dose of Lexiscan: 0.4 mg Dose of Aminophylline:150 mg  Dose of Atropine (mg): n/a Dose of Dobutamine: n/a mcg/kg/min (at max HR)  Stress Test Technologist: Smiley Houseman, CMA-N  Nuclear Technologist:   Domenic Polite, CNMT     Rest Procedure:  Myocardial perfusion imaging was performed at rest 45 minutes following the intravenous administration of Technetium 14m Sestamibi.  Rest ECG: NSR - Normal EKG  Stress Procedure:  The patient received IV Lexiscan 0.4 mg over 15-seconds.  Patient c/o chest tightness and "crazy" head.  Aminophylline 75 mg was given IV in recovery with some relief; but patient continued to c/o slight chest tightness after stress images and was given another 75 mg Aminophylline IV with complete relief.  Technetium 79m Sestamibi injected at 30-seconds.  Quantitative spect images were obtained after a 45 minute delay.  Stress ECG: No significant change from baseline ECG  QPS Raw Data Images:  Normal; no motion artifact; normal heart/lung ratio. Stress Images:  Normal homogeneous uptake in all areas of the myocardium. Rest Images:  Normal homogeneous uptake in all areas of the myocardium. Subtraction (SDS):  No evidence of ischemia. Transient Ischemic Dilatation (Normal <1.22):  0.96 Lung/Heart Ratio (Normal <0.45):  0.20  Quantitative Gated Spect Images QGS EDV:  88 ml QGS ESV:  21 ml  Impression Exercise Capacity:  Lexiscan with no exercise. BP Response:  Normal blood pressure response. Clinical Symptoms:  Mild chest pain/dyspnea. ECG Impression:  No significant ST segment change suggestive of ischemia. Comparison with Prior Nuclear Study: No images to compare  Overall Impression:  Normal stress nuclear study.  LV  Ejection Fraction: 76%.  LV Wall Motion:  NL LV Function; NL Wall Motion   Favor Kreh  Normal study, please tell patient.   Marca Ancona 05/02/2012

## 2012-05-02 NOTE — Progress Notes (Signed)
Pt's daughter notified.

## 2012-05-03 ENCOUNTER — Encounter (HOSPITAL_COMMUNITY): Payer: Medicare Other

## 2012-05-03 ENCOUNTER — Encounter (HOSPITAL_COMMUNITY)
Admission: RE | Admit: 2012-05-03 | Discharge: 2012-05-03 | Disposition: A | Discharge status: Home / Self Care | Payer: Medicare Other | Source: Ambulatory Visit | Attending: Pulmonary Disease | Admitting: Pulmonary Disease

## 2012-05-08 ENCOUNTER — Ambulatory Visit: Payer: Medicare Other | Admitting: Pulmonary Disease

## 2012-05-08 ENCOUNTER — Ambulatory Visit: Payer: Self-pay | Admitting: Gastroenterology

## 2012-05-10 ENCOUNTER — Encounter (HOSPITAL_COMMUNITY)
Admission: RE | Admit: 2012-05-10 | Discharge: 2012-05-10 | Disposition: A | Discharge status: Home / Self Care | Payer: Medicare Other | Source: Ambulatory Visit | Attending: Pulmonary Disease | Admitting: Pulmonary Disease

## 2012-05-10 LAB — PATHOLOGY REPORT

## 2012-05-10 NOTE — Progress Notes (Signed)
Hailey [name] reports having left arm numbness for the past two weeks.  I spoke with the patients daughter after class.  Hailey Fugere had reported having left arm numbness to Dr Shirlee Latch when she was seen in the office. Hailey Mcclusky had a stress test which was normal.  Hailey Defreitas's daughter says she is going to call the orthopedic doctor about her arm numbness and schedule an appointment.

## 2012-05-15 ENCOUNTER — Encounter (HOSPITAL_COMMUNITY)
Admission: RE | Admit: 2012-05-15 | Discharge: 2012-05-15 | Disposition: A | Discharge status: Home / Self Care | Payer: Medicare Other | Source: Ambulatory Visit | Attending: Pulmonary Disease | Admitting: Pulmonary Disease

## 2012-05-15 DIAGNOSIS — R0989 Other specified symptoms and signs involving the circulatory and respiratory systems: Secondary | ICD-10-CM | POA: Insufficient documentation

## 2012-05-15 DIAGNOSIS — R0609 Other forms of dyspnea: Secondary | ICD-10-CM | POA: Insufficient documentation

## 2012-05-15 DIAGNOSIS — Z5189 Encounter for other specified aftercare: Secondary | ICD-10-CM | POA: Insufficient documentation

## 2012-05-17 ENCOUNTER — Encounter (HOSPITAL_COMMUNITY)
Admission: RE | Admit: 2012-05-17 | Discharge: 2012-05-17 | Disposition: A | Discharge status: Home / Self Care | Payer: Medicare Other | Source: Ambulatory Visit | Attending: Pulmonary Disease | Admitting: Pulmonary Disease

## 2012-05-17 NOTE — Progress Notes (Signed)
Hailey [name] graduates today.  Hailey [name] may be interested in volunteering in the future.

## 2012-05-22 ENCOUNTER — Encounter (HOSPITAL_COMMUNITY): Payer: Medicare Other

## 2012-05-24 ENCOUNTER — Encounter (HOSPITAL_COMMUNITY): Payer: Medicare Other

## 2012-05-29 ENCOUNTER — Encounter (HOSPITAL_COMMUNITY): Payer: Medicare Other

## 2012-05-31 ENCOUNTER — Encounter (HOSPITAL_COMMUNITY): Payer: Medicare Other

## 2012-06-01 ENCOUNTER — Ambulatory Visit: Payer: Self-pay | Admitting: Internal Medicine

## 2012-06-04 ENCOUNTER — Encounter: Payer: Self-pay | Admitting: Pulmonary Disease

## 2012-06-04 ENCOUNTER — Ambulatory Visit (INDEPENDENT_AMBULATORY_CARE_PROVIDER_SITE_OTHER): Payer: Medicare Other | Admitting: Pulmonary Disease

## 2012-06-04 VITALS — BP 128/72 | HR 60 | Temp 98.1°F | Ht 62.5 in | Wt 191.2 lb

## 2012-06-04 DIAGNOSIS — J449 Chronic obstructive pulmonary disease, unspecified: Secondary | ICD-10-CM

## 2012-06-04 NOTE — Patient Instructions (Addendum)
You need to take the advair twice a day, and take the spiriva once a day everyday.  Keep mouth rinsed well.  Work on weight loss and keeping up your conditioning. Will check your oxygen level overnight one night.  If you need to stay on oxygen, will get you a humidity bottle to help with dryness. Weigh everyday and record in log.  followup with me in 6 mos if doing well.

## 2012-06-04 NOTE — Progress Notes (Signed)
  Subjective:    Patient ID: Hailey [name], female    DOB: [birth year], 79 y.o.   MRN: [id number]  HPI The patient comes in today for followup of her multifactorial dyspnea.  She has mild COPD, hypertrophic cardiomyopathy, and finally obesity with significant deconditioning and debility.  She has gone through pulmonary rehabilitation, and saw great improvement, but has not been continuing her conditioning program at home.  She also is taking her Advair only once a day, and is only taking Spiriva as needed.  She is not wearing her oxygen at night because of nasal dryness.  She denies any significant chest congestion or purulent mucus, and feels that her baseline is better than her prior visit.   Review of Systems  Constitutional: Negative for fever and unexpected weight change.  HENT: Positive for rhinorrhea. Negative for ear pain, nosebleeds, congestion, sore throat, sneezing, trouble swallowing, dental problem, postnasal drip and sinus pressure.   Eyes: Negative for redness and itching.  Respiratory: Positive for cough, chest tightness and shortness of breath. Negative for wheezing.        Pt feels that her heart plays a roll in her SOB at times  Cardiovascular: Positive for chest pain ( at times) and leg swelling. Negative for palpitations.  Gastrointestinal: Positive for nausea. Negative for vomiting.  Genitourinary: Negative for dysuria.  Musculoskeletal: Negative for joint swelling.  Skin: Negative for rash.  Neurological: Positive for headaches.  Hematological: Does not bruise/bleed easily.  Psychiatric/Behavioral: Positive for dysphoric mood. The patient is nervous/anxious.        Objective:   Physical Exam Morbidly obese female in no acute distress Nose without purulence or discharge noted Neck without lymphadenopathy or thyromegaly Chest with minimal basilar crackles, adequate air flow, no wheezing Cardiac exam with regular rate and rhythm, 2/6 systolic murmur Lower extremities  with mild edema, no cyanosis Alert and oriented, moves all 4 extremities.       Assessment & Plan:

## 2012-06-04 NOTE — Assessment & Plan Note (Signed)
The patient feels that her breathing is better since being in pulmonary rehabilitation, that she needs to continue her exercise program at home.  Also stressed to her that her breathing issues are multifactorial, and that her COPD represents only a small portion of this.  The only way that she is going to improve further is planned aggressive weight loss and conditioning, as well as close monitoring of her fluid balance and cardiac status.  She will also need to stay on her bronchodilators more consistently than she currently is taking.

## 2012-06-14 ENCOUNTER — Telehealth: Payer: Self-pay | Admitting: Pulmonary Disease

## 2012-06-14 DIAGNOSIS — J449 Chronic obstructive pulmonary disease, unspecified: Secondary | ICD-10-CM

## 2012-06-14 NOTE — Telephone Encounter (Signed)
lmomtcb x1 for yvonnia

## 2012-06-14 NOTE — Telephone Encounter (Signed)
Spoke with pt's daughter to verify the msg She is aware KC off today, will return tomorrow Please advise on ONO results, thanks!

## 2012-06-15 ENCOUNTER — Telehealth: Payer: Self-pay | Admitting: *Deleted

## 2012-06-15 NOTE — Telephone Encounter (Signed)
Will forward to Ashtyn to get the results, per triage protocol we do not track down results

## 2012-06-15 NOTE — Telephone Encounter (Signed)
Please advise ashtyn thanks

## 2012-06-15 NOTE — Telephone Encounter (Signed)
I have not seen the ONO results.

## 2012-06-15 NOTE — Telephone Encounter (Signed)
I have not seen this ONO. Will have to call [facility] to get them to fax it.

## 2012-06-15 NOTE — Telephone Encounter (Signed)
ONO has been received and placed in green folder for your review. Please advise KC. Thanks.

## 2012-06-15 NOTE — Telephone Encounter (Signed)
Please let pt know that she drops her oxygen level during the night off oxygen for a long period of time.  She will need to stay on oxygen, and we will order a humidity bottle for her oxygen concentrator.  Please send order to [facility] for this

## 2012-06-15 NOTE — Telephone Encounter (Signed)
Spoke with [facility]---they are faxing this to triage fax

## 2012-06-18 NOTE — Telephone Encounter (Signed)
I looked through [facility] paperwork to be scanned and this report was not there. It has already been sent to scan center. I spoke with pt daughter and advised that we do not have report at this time, it is in process of being scanned to chart. I advised to check back in a week or so. She states understanding. Carron Curie, CMA

## 2012-06-18 NOTE — Telephone Encounter (Signed)
Daughter aware of results and order sent. She would like to know how low pt dropped and for how long? Please advise KC thanks

## 2012-06-18 NOTE — Telephone Encounter (Signed)
Do not have the report any more, and has not been scanned in.

## 2012-07-11 NOTE — Telephone Encounter (Signed)
error 

## 2012-07-31 ENCOUNTER — Telehealth: Payer: Self-pay | Admitting: Pulmonary Disease

## 2012-07-31 NOTE — Telephone Encounter (Signed)
lmtcb

## 2012-08-01 NOTE — Telephone Encounter (Signed)
Records have been located and daughter has been called with response to her questions.  Asking how low pt O2 dropped during ONO. Daughter very pleased with turn around time of locating records and states that she feels that records are safe being scanned in our location rather than off-site.  Daughter very thankful for time spent locating these records.   Nothing further needed.

## 2012-08-01 NOTE — Telephone Encounter (Signed)
LMOMTCB - ONO has not been scanned to chart yet

## 2012-08-01 NOTE — Telephone Encounter (Signed)
Pt daughter Orson Gear) called in regards to ONO results. Wanting to know how low pt O2 dropped during test.  Pt was told that ONO has not been scanned into chart yet but that was in the progress of being processed in Medical Records. Daughter was very upset at the fact that its been 6 weeks and the results are still not scanned into chart. I explained to the patient that we have a new process so that that we can assure that all medical records are processed correctly and are done here in the our office. I asked patient if she minded me checking with Medical Records Dept and calling her back in regards to these records. Pt was fine with this.   Called [phone]) to have her locate these records. I have also called Melissa with [facility] to obtain another copy of ONO as well.

## 2012-08-07 ENCOUNTER — Encounter: Payer: Self-pay | Admitting: Pulmonary Disease

## 2012-12-04 ENCOUNTER — Ambulatory Visit: Payer: Medicare Other | Admitting: Pulmonary Disease

## 2013-04-10 ENCOUNTER — Observation Stay: Payer: Self-pay | Admitting: Internal Medicine

## 2013-04-10 DIAGNOSIS — I4891 Unspecified atrial fibrillation: Secondary | ICD-10-CM

## 2013-04-10 DIAGNOSIS — I369 Nonrheumatic tricuspid valve disorder, unspecified: Secondary | ICD-10-CM

## 2013-04-10 LAB — CK TOTAL AND CKMB (NOT AT ARMC)
CK, Total: 116 U/L
CK, Total: 111 U/L
CK-MB: 4.1 ng/mL — ABNORMAL HIGH (ref 0.5–3.6)
CK-MB: 3.9 ng/mL — AB (ref 0.5–3.6)

## 2013-04-10 LAB — BASIC METABOLIC PANEL
Anion Gap: 8 (ref 7–16)
BUN: 19 mg/dL — AB (ref 7–18)
Calcium, Total: 9.8 mg/dL (ref 8.5–10.1)
Chloride: 103 mmol/L (ref 98–107)
Co2: 28 mmol/L (ref 21–32)
Creatinine: 0.89 mg/dL (ref 0.60–1.30)
Glucose: 128 mg/dL — ABNORMAL HIGH (ref 65–99)
Osmolality: 281 (ref 275–301)
Potassium: 3.3 mmol/L — ABNORMAL LOW (ref 3.5–5.1)
Sodium: 139 mmol/L (ref 136–145)

## 2013-04-10 LAB — CBC
HCT: 38.3 % (ref 35.0–47.0)
HGB: 13.2 g/dL (ref 12.0–16.0)
MCH: 31.2 pg (ref 26.0–34.0)
MCHC: 34.3 g/dL (ref 32.0–36.0)
MCV: 91 fL (ref 80–100)
Platelet: 252 10*3/uL (ref 150–440)
RBC: 4.22 10*6/uL (ref 3.80–5.20)
RDW: 13.9 % (ref 11.5–14.5)
WBC: 8.2 10*3/uL (ref 3.6–11.0)

## 2013-04-10 LAB — TROPONIN I
Troponin-I: <0.02 ng/mL
Troponin-I: <0.02 ng/mL

## 2013-04-11 LAB — BASIC METABOLIC PANEL
Anion Gap: 5 — ABNORMAL LOW (ref 7–16)
BUN: 20 mg/dL — AB (ref 7–18)
CREATININE: 0.95 mg/dL (ref 0.60–1.30)
Calcium, Total: 9.2 mg/dL (ref 8.5–10.1)
Chloride: 104 mmol/L (ref 98–107)
Co2: 30 mmol/L (ref 21–32)
EGFR (Non-African Amer.): 57 — ABNORMAL LOW
Glucose: 116 mg/dL — ABNORMAL HIGH (ref 65–99)
Osmolality: 281 (ref 275–301)
POTASSIUM: 3.8 mmol/L (ref 3.5–5.1)
Sodium: 139 mmol/L (ref 136–145)

## 2013-04-11 LAB — CBC WITH DIFFERENTIAL/PLATELET
Basophil #: 0 10*3/uL (ref 0.0–0.1)
Basophil %: 0.4 %
EOS PCT: 2.8 %
Eosinophil #: 0.2 10*3/uL (ref 0.0–0.7)
HCT: 34.9 % — AB (ref 35.0–47.0)
HGB: 12 g/dL (ref 12.0–16.0)
LYMPHS ABS: 1.8 10*3/uL (ref 1.0–3.6)
LYMPHS PCT: 27.1 %
MCH: 31.3 pg (ref 26.0–34.0)
MCHC: 34.3 g/dL (ref 32.0–36.0)
MCV: 91 fL (ref 80–100)
Monocyte #: 0.7 x10 3/mm (ref 0.2–0.9)
Monocyte %: 10.8 %
NEUTROS ABS: 3.9 10*3/uL (ref 1.4–6.5)
NEUTROS PCT: 58.9 %
Platelet: 231 10*3/uL (ref 150–440)
RBC: 3.83 10*6/uL (ref 3.80–5.20)
RDW: 14 % (ref 11.5–14.5)
WBC: 6.6 10*3/uL (ref 3.6–11.0)

## 2013-04-11 LAB — LIPID PANEL
CHOLESTEROL: 163 mg/dL (ref 0–200)
HDL Cholesterol: 40 mg/dL (ref 40–60)
LDL CHOLESTEROL, CALC: 67 mg/dL (ref 0–100)
TRIGLYCERIDES: 281 mg/dL — AB (ref 0–200)
VLDL Cholesterol, Calc: 56 mg/dL — ABNORMAL HIGH (ref 5–40)

## 2013-04-11 LAB — MAGNESIUM: Magnesium: 2 mg/dL

## 2013-04-16 ENCOUNTER — Telehealth: Payer: Self-pay

## 2013-04-16 NOTE — Telephone Encounter (Signed)
Attempted to contact pt regarding discharge from [facility] on 04/11/13. Left message for pt to call back.

## 2013-04-16 NOTE — Telephone Encounter (Signed)
Patient contacted regarding discharge from [facility] on 04/16/13.  Patient understands to follow up with Dr. Rockey Situ on 04/30/13 at 10:00 at [facility]. Patient understands discharge instructions? yes Patient understands medications and regiment? yes Patient understands to bring all medications to this visit? yes  Reviewed med list w/ pt and updated list.

## 2013-04-25 ENCOUNTER — Telehealth: Payer: Self-pay | Admitting: *Deleted

## 2013-04-25 ENCOUNTER — Ambulatory Visit (INDEPENDENT_AMBULATORY_CARE_PROVIDER_SITE_OTHER): Payer: Medicare HMO | Admitting: Physician Assistant

## 2013-04-25 ENCOUNTER — Encounter: Payer: Self-pay | Admitting: Physician Assistant

## 2013-04-25 VITALS — BP 148/62 | HR 70 | Ht 62.0 in | Wt 188.8 lb

## 2013-04-25 DIAGNOSIS — I4891 Unspecified atrial fibrillation: Secondary | ICD-10-CM

## 2013-04-25 DIAGNOSIS — I421 Obstructive hypertrophic cardiomyopathy: Secondary | ICD-10-CM

## 2013-04-25 DIAGNOSIS — E785 Hyperlipidemia, unspecified: Secondary | ICD-10-CM

## 2013-04-25 DIAGNOSIS — R002 Palpitations: Secondary | ICD-10-CM

## 2013-04-25 DIAGNOSIS — I10 Essential (primary) hypertension: Secondary | ICD-10-CM

## 2013-04-25 DIAGNOSIS — I480 Paroxysmal atrial fibrillation: Secondary | ICD-10-CM | POA: Insufficient documentation

## 2013-04-25 DIAGNOSIS — R0602 Shortness of breath: Secondary | ICD-10-CM

## 2013-04-25 DIAGNOSIS — R079 Chest pain, unspecified: Secondary | ICD-10-CM

## 2013-04-25 MED ORDER — METOPROLOL TARTRATE 50 MG PO TABS: 25.0000 mg | ORAL_TABLET | Freq: Two times a day (BID) | ORAL | Status: DC

## 2013-04-25 MED ORDER — DILTIAZEM HCL ER COATED BEADS 180 MG PO CP24
180.0000 mg | ORAL_CAPSULE | Freq: Every day | ORAL | Status: DC
Start: 2013-04-25 — End: 2013-06-06

## 2013-04-25 NOTE — Assessment & Plan Note (Addendum)
LVOT gradient exaggerated w/ Valsalva on recent echo. Preserved EF. Denies CHF type symptoms. Euvolemic on exam. Currently stable. EKG w/ ST changes chronic and likely reflective of structural cardiac disease. Continue BB, CCB.

## 2013-04-25 NOTE — Telephone Encounter (Signed)
Pt sched to see Nicole Kindred, [place] this afternoon at 1:45.

## 2013-04-25 NOTE — Assessment & Plan Note (Addendum)
Continues to experience exertional chest discomfort and dyspnea. Ruled out at [facility] recently. Normal Myoview last year. Normal cath 2009. Most consistent with paroxysms of a-fib w/ RVR and supply-demand mismatch given underlying HOCM. Will arrange 48 hr Holter. Reviewing meds, the patient is taking short-acting diltiazem in the morning. Will transition to extended-release formulation to provide 24 hour rate-control. Continue Lopressor 25mg  BID + extra tab PRN for symptoms. Continue ASA. Long h/o recurrent GIBs. Given HOCM, if rhythm control elected in the future, consider disopyramide or amiodarone. Stress, anxiety and suboptimal OSA management contributing. Follow-up PCP. Stressed importance of CPAP.

## 2013-04-25 NOTE — Assessment & Plan Note (Signed)
Continue statin. 

## 2013-04-25 NOTE — Telephone Encounter (Signed)
Left message for pt that we have not seen her in the office yet, and for her to call to see about moving her appt up.

## 2013-04-25 NOTE — Progress Notes (Signed)
Patient ID: ERMA RAICHE, female   DOB: [birth year], 80 y.o.   MRN: [id number]   Date:  04/25/2013   ID:  HAFSA LOHN, DOB 1932-06-13, MRN [id number]  PCP:  Marcello Fennel, MD  Primary Cardiologist:  Previously Einar Crow, MD; to re-establish w/ T. Rockey Situ, MD   History of Present Illness:  KORYN CHARLOT is a 80 y.o. female w/ PMHx s/f HOCM, h/o DVT (s/p IVC filter), h/o PUD/GIB, PAT, cerebral aneurysm, COPD, pulmonary HTN, HTN, HLD, OSA (intolerant to CPAP, on 2L via n/c), fibromyalgia and anxiety who was admitted to [facility] 2/25 to 2/26 for new onset atrial fibrillation w/ RVR.  She was previously followed by Dr. Aundra Dubin, last seen 03/2012 in the office.  She has baseline exertional dyspnea- multifactorial due to deconditioning, COPD. Underwent pulmonary rehab in 2014, followed by Dr. Gwenette Greet.  She lives independently at [facility] and experiences baseline DOE- 1-2x/week- having to rest when walking from the elevator to her apartment. She endorsed occasional chest squeezing which resolved quickly with rest. Over the prior 2 weeks, she noted these episodes occurred more frequently, with less exertion and requiring a longer duration of rest for relief. She had a severe episode of substernal chest squeezing radiating to her neck w/ associated dyspnea and fatigue. She also noted tachy-palpitations and lightheadedness. HR at home was 130 bpm at rest. Given her ongoing symptoms, she sought medical care at [facility] ED.  EKG in the ED revealed newly recognized atrial fibrillation w/ RVR, no ischemic changes. She formally ruled out. Symptoms were attributed to atrial fibrillation w/ RVR on exertion.  CHADSVASc not profoundly high- 4 (HTN, age > 21, female). She has a h/o profuse GIB in the past, required IVC filter for DVT in the past.   She was started on Cardizem 60mg  TID and Toprol-XL and converted to SR overnight. She was discharged on Cardizem 120mg  PO daily (immediate release) and Lopressor 25mg  PO BID. She was  not anticoagulated given extensive history of GIB in the past. ASA 325mg  was started.  She reports some reduced frequency, but persistent episode of exertional chest discomfort when walking to her apartment which relieves with rest. Pulse on palpation is irregular and HR > 100. She had a severe episode this AM after making the bed and requested to be seen in the office today.  She is anxious on her visit today and mentions several acute stressors. Complains of intermittent headaches and chronic fatiuge.  EKG: NSR, small Q waves II, III, aVF, V3-V6, downsloping/flat ST depressions V2-V6, II, III, aVF (similar to prior tracings)  Wt Readings from Last 3 Encounters:  04/25/13 85.616 kg (188 lb 12 oz)  06/04/12 86.728 kg (191 lb 3.2 oz)  05/01/12 85.73 kg (189 lb)     Past Medical History  Diagnosis Date  . DVT (deep vein thrombosis) in pregnancy 09/2008    LLE, recurrent hx, has IVC filter. Not on coumadin now with history of GI bleeding  . Hyperlipidemia   . GERD (gastroesophageal reflux disease)   . Hypertrophic cardiomyopathy     a. Echo 4/09 with EF 70-75%, asymmetricy basal septal hypertrophy, mild MR without systolic anterior motion of the mitral valve, LVOT gradient to 130 mmHg with Valsalva b. Echo 7/13: EF 60-65%, mild focal basal septal hypertrophy, no significant LVOT gradient, no MV SAM c. Echo 2/15: EF 55-60%, HOCM, resting LVOT gradient 29 mmHg, Valsalva LVOT gradient > 140 mmHg, mild LVH, mild TR, elevated PASP  . Anemia  Chronic  . Osteoarthritis     Hx of left TKR  . Fibromyalgia   . Cerebral aneurysm     Hx of  . Migraine headache     Hx of  . Hypertension   . COPD (chronic obstructive pulmonary disease)   . PUD (peptic ulcer disease)     With GI bleeding  . History of colonoscopy   . PAF (paroxysmal atrial fibrillation)     Full-dose ASA alone  . OSA (obstructive sleep apnea)     Intolerant to CPAP, wears 2L via n/c  . Fibromyalgia   . Anxiety   . PAT  (paroxysmal atrial tachycardia)     Current Outpatient Prescriptions  Medication Sig Dispense Refill  . gabapentin (NEURONTIN) 100 MG capsule Take 200 mg by mouth every evening. Once daily in evening      . gabapentin (NEURONTIN) 300 MG capsule Take 300 mg by mouth every morning. Once daily in am      . Acetaminophen (TYLENOL EXTRA STRENGTH PO) Take by mouth as needed.      . dimenhyDRINATE (DRAMAMINE) 50 MG tablet Take 50 mg by mouth every 8 (eight) hours as needed.        . DULoxetine (CYMBALTA) 60 MG capsule Take 90 mg by mouth daily.      . Fluticasone-Salmeterol (ADVAIR DISKUS) 100-50 MCG/DOSE AEPB Inhale 1 puff into the lungs every 12 (twelve) hours.  14 each  0  . gabapentin (NEURONTIN) 400 MG capsule Take 400 mg by mouth at bedtime.      Marland Kitchen loratadine (CLARITIN) 10 MG tablet Take 10 mg by mouth daily.      Marland Kitchen lovastatin (MEVACOR) 40 MG tablet Take 40 mg by mouth daily.        . Melatonin 3 MG TABS Take 1 tablet by mouth at bedtime.        . metoprolol (LOPRESSOR) 50 MG tablet Take 25 mg by mouth 2 (two) times daily.      . pantoprazole (PROTONIX) 40 MG tablet Take 40 mg by mouth daily.      . polyethylene glycol (MIRALAX / GLYCOLAX) packet Take 17 g by mouth daily.      . Polyethylene Glycol 3350 POWD        . potassium chloride (K-DUR) 10 MEQ tablet Take 10 mEq by mouth 2 (two) times daily.      . pramipexole (MIRAPEX) 0.25 MG tablet Take 1 tablet by mouth daily.      . RABEprazole (ACIPHEX) 20 MG tablet Take 20 mg by mouth daily. Twice a day for two weeks then once a day      . sucralfate (CARAFATE) 1 G tablet       . tiotropium (SPIRIVA) 18 MCG inhalation capsule Place 18 mcg into inhaler and inhale as needed.        . torsemide (DEMADEX) 20 MG tablet Take 20 mg by mouth 2 (two) times daily.       . traMADol (ULTRAM) 50 MG tablet Take 50 mg by mouth 4 (four) times daily.        No current facility-administered medications for this visit.    Allergies:    Allergies  Allergen  Reactions  . Codeine     REACTION: causes nausea  . Compazine Other (See Comments)    ? Confusion per patient  . Ketorolac Tromethamine     toradol  . Nitrofurantoin     REACTION: causes rash  . Nsaids   . Phenazopyridine  Hcl     REACTION: causes vision problems pyridum   . Pyridium Plus [Phenazopyridine-Butabarb-Hyosc]     Social History:  The patient  reports that she quit smoking about 4 years ago. Her smoking use included Cigarettes. She has a 60 pack-year smoking history. She does not have any smokeless tobacco history on file. She reports that she drinks alcohol. She reports that she does not use illicit drugs.   Family History:  Family History  Problem Relation Age of Onset  . Pancreatic cancer Mother   . Colon cancer Father   . Colon polyps Father   . Heart disease Father   . Heart disease Sister     More than 1 sister  . Colon cancer Brother   . Colon polyps Other     Siblings    Review of Systems: General: negative for chills, fever, night sweats or weight changes.  Cardiovascular: positive for chest pain, dyspnea on exertion, palpitations, negative for edema, orthopnea, paroxysmal nocturnal dyspnea or shortness of breath Dermatological: negative for rash Respiratory: negative for cough or wheezing Urologic: negative for hematuria Abdominal: negative for nausea, vomiting, diarrhea, bright red blood per rectum, melena, or hematemesis Neurologic: negative for visual changes, syncope, or dizziness All other systems reviewed and are otherwise negative except as noted above.  PHYSICAL EXAM: VS:  BP 148/62  Pulse 70  Ht 5\' 2"  (1.575 m)  Wt 85.616 kg (188 lb 12 oz)  BMI 34.51 kg/m2 Well nourished, well developed, anxious-appearing elderly female in no acute distress HEENT: normal, PERRL Neck: no JVD or bruits Cardiac:  normal S1, S2; RRR; no murmur or gallops Lungs:  clear to auscultation bilaterally, no wheezing, rhonchi or rales Abd: soft, nontender, no  hepatomegaly, normoactive BS x 4 quads Ext: no edema, cyanosis or clubbing Skin: warm and dry, cap refill < 2 sec Neuro:  CNs 2-12 intact, no focal abnormalities noted Musculoskeletal: strength and tone appropriate for age  Psych: normal affect

## 2013-04-25 NOTE — Patient Instructions (Signed)
We will increase your Cardizem to 180mg  by mouth daily.   Please continue to take metoprolol tartrate (Lopressor) 25mg  by mouth twice a day and as needed for heart palpitations and chest pain with exertion.   We will arrange a 48 hour heart monitor to correlate episodes of atrial fibrillation with your symptoms.   We may need to consider trying a heart rhythm medication if Cardizem and metoprolol are failing to keep your heart in a normal rhythm.   We will see you back in 1 month and be in touch regarding when to pick up the Holter monitor and after we interpret it.

## 2013-04-25 NOTE — Telephone Encounter (Signed)
Patient having problems regulating her heartbeat. Please advise

## 2013-04-25 NOTE — Assessment & Plan Note (Signed)
Well controlled. Continue antihypertensives.

## 2013-04-30 ENCOUNTER — Encounter: Payer: Medicare HMO | Admitting: Cardiovascular Disease

## 2013-05-07 ENCOUNTER — Telehealth: Payer: Self-pay | Admitting: *Deleted

## 2013-05-07 ENCOUNTER — Encounter: Payer: Medicare HMO | Admitting: Cardiovascular Disease

## 2013-05-07 NOTE — Telephone Encounter (Signed)
Order will not be faxed to [facility] due to pt needing transportation. Pt will be getting 48 hour holter from [facility].

## 2013-05-07 NOTE — Telephone Encounter (Signed)
I faxed order to [facility] for holter monitor. Pt will be contacted by [facility].

## 2013-05-07 NOTE — Telephone Encounter (Signed)
Patient called and she never received her heart monitor. Please call patient.

## 2013-05-08 ENCOUNTER — Other Ambulatory Visit: Payer: Self-pay

## 2013-05-08 ENCOUNTER — Telehealth: Payer: Self-pay | Admitting: *Deleted

## 2013-05-08 DIAGNOSIS — R0602 Shortness of breath: Secondary | ICD-10-CM

## 2013-05-08 DIAGNOSIS — R079 Chest pain, unspecified: Secondary | ICD-10-CM

## 2013-05-08 DIAGNOSIS — R002 Palpitations: Secondary | ICD-10-CM

## 2013-05-08 NOTE — Telephone Encounter (Signed)
[facility] 48 hour monitor is no longer available. We will have to figure out another way for patient to have monitor placed due to transportation reasons.     Status: Signed       Order will not be faxed to [facility] due to pt needing transportation. Pt will be getting 48 hour holter from [facility].       Britt Bottom, CMA at 05/07/2013 4:54 PM    Status: Signed        I faxed order to [facility] for holter monitor. Pt will be contacted by Lucille Passy at 05/07/2013 4:25 PM    Status: Signed        Patient called and she never received her heart monitor. Please call patient.

## 2013-05-08 NOTE — Telephone Encounter (Signed)
Notified patient to see if she can get a ride to our office to have the 48 hour holter monitor placed. The patient will contact her daughter and call our office back with what is convenient for her.

## 2013-05-08 NOTE — Telephone Encounter (Signed)
Patient coming tomorrow at 10:30 to have holter monitor placed in the office.

## 2013-05-09 ENCOUNTER — Ambulatory Visit: Payer: Commercial Managed Care - HMO | Admitting: *Deleted

## 2013-05-09 DIAGNOSIS — R002 Palpitations: Secondary | ICD-10-CM

## 2013-05-09 DIAGNOSIS — I480 Paroxysmal atrial fibrillation: Secondary | ICD-10-CM

## 2013-05-09 NOTE — Telephone Encounter (Signed)
Pt came in for 48 hour holter monitor today.

## 2013-05-16 ENCOUNTER — Observation Stay: Payer: Self-pay | Admitting: Internal Medicine

## 2013-05-16 DIAGNOSIS — I4891 Unspecified atrial fibrillation: Secondary | ICD-10-CM

## 2013-05-16 DIAGNOSIS — R072 Precordial pain: Secondary | ICD-10-CM

## 2013-05-16 DIAGNOSIS — I428 Other cardiomyopathies: Secondary | ICD-10-CM

## 2013-05-16 LAB — BASIC METABOLIC PANEL
ANION GAP: 8 (ref 7–16)
BUN: 15 mg/dL (ref 7–18)
Calcium, Total: 8.9 mg/dL (ref 8.5–10.1)
Chloride: 108 mmol/L — ABNORMAL HIGH (ref 98–107)
Co2: 26 mmol/L (ref 21–32)
Creatinine: 0.89 mg/dL (ref 0.60–1.30)
EGFR (African American): >60
GLUCOSE: 214 mg/dL — AB (ref 65–99)
OSMOLALITY: 290 (ref 275–301)
Potassium: 3.6 mmol/L (ref 3.5–5.1)
SODIUM: 142 mmol/L (ref 136–145)

## 2013-05-16 LAB — PROTIME-INR
INR: 0.9
PROTHROMBIN TIME: 11.9 s (ref 11.5–14.7)

## 2013-05-16 LAB — CK-MB
CK-MB: 4.2 ng/mL — ABNORMAL HIGH (ref 0.5–3.6)
CK-MB: 4 ng/mL — AB (ref 0.5–3.6)
CK-MB: 4.6 ng/mL — ABNORMAL HIGH (ref 0.5–3.6)

## 2013-05-16 LAB — URINALYSIS, COMPLETE
BILIRUBIN, UR: NEGATIVE
Bacteria: NONE SEEN
Blood: NEGATIVE
Glucose,UR: 50 mg/dL (ref 0–75)
Ketone: NEGATIVE
Nitrite: NEGATIVE
PH: 6 (ref 4.5–8.0)
Protein: NEGATIVE
RBC,UR: 1 /HPF (ref 0–5)
Specific Gravity: 1.012 (ref 1.003–1.030)
Squamous Epithelial: 1
WBC UR: 2 /HPF (ref 0–5)

## 2013-05-16 LAB — TROPONIN I
Troponin-I: <0.02 ng/mL
Troponin-I: <0.02 ng/mL

## 2013-05-16 LAB — CBC
HCT: 35.9 % (ref 35.0–47.0)
HGB: 12 g/dL (ref 12.0–16.0)
MCH: 31 pg (ref 26.0–34.0)
MCHC: 33.5 g/dL (ref 32.0–36.0)
MCV: 93 fL (ref 80–100)
Platelet: 236 10*3/uL (ref 150–440)
RBC: 3.87 10*6/uL (ref 3.80–5.20)
RDW: 14.6 % — ABNORMAL HIGH (ref 11.5–14.5)
WBC: 5.4 10*3/uL (ref 3.6–11.0)

## 2013-05-17 LAB — CBC WITH DIFFERENTIAL/PLATELET
Basophil #: 0 10*3/uL (ref 0.0–0.1)
Basophil %: 0.3 %
EOS PCT: 1.8 %
Eosinophil #: 0.1 10*3/uL (ref 0.0–0.7)
HCT: 36.8 % (ref 35.0–47.0)
HGB: 12.5 g/dL (ref 12.0–16.0)
Lymphocyte #: 1.6 10*3/uL (ref 1.0–3.6)
Lymphocyte %: 28.8 %
MCH: 31.2 pg (ref 26.0–34.0)
MCHC: 33.9 g/dL (ref 32.0–36.0)
MCV: 92 fL (ref 80–100)
Monocyte #: 0.5 x10 3/mm (ref 0.2–0.9)
Monocyte %: 9.3 %
Neutrophil #: 3.4 10*3/uL (ref 1.4–6.5)
Neutrophil %: 59.8 %
Platelet: 228 10*3/uL (ref 150–440)
RBC: 3.99 10*6/uL (ref 3.80–5.20)
RDW: 14.5 % (ref 11.5–14.5)
WBC: 5.6 10*3/uL (ref 3.6–11.0)

## 2013-05-17 LAB — BASIC METABOLIC PANEL
Anion Gap: 8 (ref 7–16)
BUN: 19 mg/dL — ABNORMAL HIGH (ref 7–18)
CHLORIDE: 102 mmol/L (ref 98–107)
CO2: 29 mmol/L (ref 21–32)
Calcium, Total: 9.3 mg/dL (ref 8.5–10.1)
Creatinine: 0.94 mg/dL (ref 0.60–1.30)
EGFR (African American): >60
EGFR (Non-African Amer.): 58 — ABNORMAL LOW
GLUCOSE: 128 mg/dL — AB (ref 65–99)
Osmolality: 281 (ref 275–301)
Potassium: 3.6 mmol/L (ref 3.5–5.1)
Sodium: 139 mmol/L (ref 136–145)

## 2013-05-17 LAB — URINE CULTURE

## 2013-05-17 LAB — LIPID PANEL
Cholesterol: 205 mg/dL — ABNORMAL HIGH (ref 0–200)
HDL Cholesterol: 47 mg/dL (ref 40–60)
Ldl Cholesterol, Calc: 96 mg/dL (ref 0–100)
TRIGLYCERIDES: 308 mg/dL — AB (ref 0–200)
VLDL Cholesterol, Calc: 62 mg/dL — ABNORMAL HIGH (ref 5–40)

## 2013-05-17 LAB — MAGNESIUM: Magnesium: 2 mg/dL

## 2013-05-27 ENCOUNTER — Ambulatory Visit: Payer: Medicare HMO | Admitting: Cardiovascular Disease

## 2013-05-31 ENCOUNTER — Telehealth: Payer: Self-pay

## 2013-05-31 NOTE — Telephone Encounter (Signed)
Spoke w/ pt.  She reports that torsemide is not working for her. Reports that her feet, ankles and abdomen feel swollen. Her wt is up about 4-5 lbs over the past week, reports more SOB, feels like she is "choking" when she sits down.  She would like to go back on her lasix, as she feels that worked for her and right now she is miserable.  Please advise.  Thank you.

## 2013-05-31 NOTE — Telephone Encounter (Signed)
Spoke w/ pt.  Advised her of Dr. Donivan Scull recommendation. She will call Monday to let us know how she is doing.

## 2013-05-31 NOTE — Telephone Encounter (Signed)
i would stop the diltiazem This is causing the swelling most likely Stay on torsemide twice a day Increase the metoprolol 50 mg po  BID  Check blood pressure over the weekend and call the office on Monday Swelling will take a few weeks to get better It will improve with TED hose and leg elevation

## 2013-05-31 NOTE — Telephone Encounter (Signed)
Pt called and states she would like Dr Rockey Situ to call in Lasix, states that the fluid pill she is on is not helping.

## 2013-06-06 ENCOUNTER — Encounter: Payer: Self-pay | Admitting: Cardiovascular Disease

## 2013-06-06 ENCOUNTER — Ambulatory Visit (INDEPENDENT_AMBULATORY_CARE_PROVIDER_SITE_OTHER): Payer: Medicare HMO | Admitting: Cardiovascular Disease

## 2013-06-06 VITALS — BP 120/62 | HR 46 | Ht 62.0 in | Wt 194.0 lb

## 2013-06-06 DIAGNOSIS — G9332 Myalgic encephalomyelitis/chronic fatigue syndrome: Secondary | ICD-10-CM

## 2013-06-06 DIAGNOSIS — I10 Essential (primary) hypertension: Secondary | ICD-10-CM

## 2013-06-06 DIAGNOSIS — R600 Localized edema: Secondary | ICD-10-CM

## 2013-06-06 DIAGNOSIS — I480 Paroxysmal atrial fibrillation: Secondary | ICD-10-CM

## 2013-06-06 DIAGNOSIS — J449 Chronic obstructive pulmonary disease, unspecified: Secondary | ICD-10-CM

## 2013-06-06 DIAGNOSIS — R609 Edema, unspecified: Secondary | ICD-10-CM

## 2013-06-06 DIAGNOSIS — I4891 Unspecified atrial fibrillation: Secondary | ICD-10-CM

## 2013-06-06 DIAGNOSIS — R5383 Other fatigue: Secondary | ICD-10-CM

## 2013-06-06 DIAGNOSIS — R5381 Other malaise: Secondary | ICD-10-CM

## 2013-06-06 DIAGNOSIS — R5382 Chronic fatigue, unspecified: Secondary | ICD-10-CM

## 2013-06-06 MED ORDER — POTASSIUM CHLORIDE ER 10 MEQ PO TBCR: 10.0000 meq | EXTENDED_RELEASE_TABLET | Freq: Two times a day (BID) | ORAL | Status: DC | PRN

## 2013-06-06 NOTE — Assessment & Plan Note (Signed)
Recently started on a meal grown with improvement of her rhythm. She has bradycardia today and we will decrease the metoprolol back to 25 mg twice a day with amiodarone 200 mg daily. If she continues to have bradycardia,

## 2013-06-06 NOTE — Patient Instructions (Addendum)
  Please decrease the amiodarone down to one a day Decrease the metoprolol down to 25 mg twice a day  If the heart rate continues to run slow, heart rate low 50s we would decrease the metoprolol down to 12.5 mg twice a (1/4 pill) day  Please call us if you have new issues that need to be addressed before your next appt.  Your physician wants you to follow-up in: 1 month.

## 2013-06-06 NOTE — Assessment & Plan Note (Signed)
Blood pressure is well controlled on today's visit. No changes made to the medications. 

## 2013-06-06 NOTE — Assessment & Plan Note (Signed)
Significant fatigue and weakness today. This could be secondary to underlying bradycardia.

## 2013-06-06 NOTE — Assessment & Plan Note (Signed)
Leg swelling likely from Cardizem. Nonpitting. This was held last week. Recommended she stay on her torsemide twice a day for now

## 2013-06-06 NOTE — Progress Notes (Signed)
Patient ID: Hailey [name], female    DOB: 01/06/1933, 80 y.o.   MRN: [id number]  HPI Comments: NA Hailey [name] is a 80 y.o. female w/ PMHx s/f HOCM, h/o DVT (s/p IVC filter), h/o PUD/GIB, PAT, cerebral aneurysm, COPD, pulmonary HTN, HTN, HLD, OSA (intolerant to CPAP, on 2L via n/c), fibromyalgia and anxiety who was admitted to [facility] 2/25 to 2/26 for new onset atrial fibrillation w/ RVR.  She was previously followed by Dr. Aundra Dubin She has baseline exertional dyspnea- multifactorial due to deconditioning, COPD. Underwent pulmonary rehab in 2014, followed by Dr. Gwenette Greet.  She lives independently at [facility] and experiences baseline DOE- 1-2x/week- having to rest when walking from the elevator to her apartment. In hindsight, these could have been episodes of atrial fibrillation. She has had 2 admissions to the hospital in February and April 2015 for breakthrough arrhythmia. She was not started on anticoagulation given a history of GI bleeding Hospital records were reviewed today. We did consult on her while she was in the hospital On her most recent admission in early April 2013, she was started on amiodarone. He did receive a phone call last week for bloating, swelling. Cardizem was held and metoprolol was increased up to 50 mg twice a day  In followup today, she reports having bradycardia, continued bloating. Significant fatigue, heart rate sometimes in the 40s, high 30s She denies any tachycardia or palpitations concerning for atrial fibrillation  CHADSVASc not profoundly high- 4 (HTN, age > 48, female).   h/o profuse GIB in the past, required IVC filter for DVT in the past.   She continues to have severe neck pain, radiating pain up to her head with headaches  EKG: sinus bradycardia with rate 46 beats per minute, nonspecific ST abnormality   Outpatient Encounter Prescriptions as of 06/06/2013  Medication Sig  . Acetaminophen (TYLENOL EXTRA STRENGTH PO) Take by mouth as needed.  Marland Kitchen amiodarone  (PACERONE) 200 MG tablet Take 200 mg by mouth 2 (two) times daily.  Marland Kitchen dimenhyDRINATE (DRAMAMINE) 50 MG tablet Take 50 mg by mouth every 8 (eight) hours as needed.    . DULoxetine (CYMBALTA) 60 MG capsule Take 90 mg by mouth daily.  . Fluticasone-Salmeterol (ADVAIR DISKUS) 100-50 MCG/DOSE AEPB Inhale 1 puff into the lungs every 12 (twelve) hours.  . gabapentin (NEURONTIN) 100 MG capsule Take 200 mg by mouth every evening. Once daily in evening  . gabapentin (NEURONTIN) 300 MG capsule Take 300 mg by mouth every morning. Once daily in am  . gabapentin (NEURONTIN) 400 MG capsule Take 400 mg by mouth at bedtime.  Marland Kitchen loratadine (CLARITIN) 10 MG tablet Take 10 mg by mouth daily.  Marland Kitchen lovastatin (MEVACOR) 40 MG tablet Take 40 mg by mouth daily.    . Melatonin 3 MG TABS Take 1 tablet by mouth at bedtime.    . metoprolol (LOPRESSOR) 50 MG tablet Take 50 mg by mouth 2 (two) times daily.  . pantoprazole (PROTONIX) 40 MG tablet Take 40 mg by mouth daily.  . polyethylene glycol (MIRALAX / GLYCOLAX) packet Take 17 g by mouth daily.  . Polyethylene Glycol 3350 POWD    . potassium chloride (K-DUR) 10 MEQ tablet Take 1 tablet (10 mEq total) by mouth 2 (two) times daily as needed.  . pramipexole (MIRAPEX) 0.25 MG tablet Take 1 tablet by mouth daily.  . sucralfate (CARAFATE) 1 G tablet Take 1 g by mouth 4 (four) times daily -  with meals and at bedtime.   Marland Kitchen  tiotropium (SPIRIVA) 18 MCG inhalation capsule Place 18 mcg into inhaler and inhale as needed.    . torsemide (DEMADEX) 20 MG tablet Take 20 mg by mouth 2 (two) times daily.     Review of Systems  Constitutional: Positive for fatigue.  HENT: Negative.   Eyes: Negative.   Respiratory: Positive for shortness of breath.   Cardiovascular: Negative.   Gastrointestinal: Negative.   Endocrine: Negative.   Musculoskeletal: Negative.   Skin: Negative.   Allergic/Immunologic: Negative.   Neurological: Positive for weakness.  Hematological: Negative.    Psychiatric/Behavioral: Negative.   All other systems reviewed and are negative.   BP 120/62  Pulse 46  Ht 5\' 2"  (1.575 m)  Wt 194 lb (87.998 kg)  BMI 35.47 kg/m2  Physical Exam  Nursing note and vitals reviewed. Constitutional: She is oriented to person, place, and time. She appears well-developed and well-nourished.  HENT:  Head: Normocephalic.  Nose: Nose normal.  Mouth/Throat: Oropharynx is clear and moist.  Eyes: Conjunctivae are normal. Pupils are equal, round, and reactive to light.  Neck: Normal range of motion. Neck supple. No JVD present.  Cardiovascular: Normal rate, regular rhythm, S1 normal, S2 normal, normal heart sounds and intact distal pulses.  Exam reveals no gallop and no friction rub.   No murmur heard. Pulmonary/Chest: Effort normal and breath sounds normal. No respiratory distress. She has no wheezes. She has no rales. She exhibits no tenderness.  Abdominal: Soft. Bowel sounds are normal. She exhibits no distension. There is no tenderness.  Musculoskeletal: Normal range of motion. She exhibits no edema and no tenderness.  Lymphadenopathy:    She has no cervical adenopathy.  Neurological: She is alert and oriented to person, place, and time. Coordination normal.  Skin: Skin is warm and dry. No rash noted. No erythema.  Psychiatric: She has a normal mood and affect. Her behavior is normal. Judgment and thought content normal.    Assessment and Plan

## 2013-06-06 NOTE — Assessment & Plan Note (Signed)
Strongly recommended she at least wear her oxygen to sleep.Marland Kitchen also recommended she monitor her saturations with walking. Hypoxia could be contributing to her paroxysmal atrial fibrillation

## 2013-06-18 ENCOUNTER — Ambulatory Visit (INDEPENDENT_AMBULATORY_CARE_PROVIDER_SITE_OTHER): Payer: Medicare HMO

## 2013-06-18 DIAGNOSIS — R002 Palpitations: Secondary | ICD-10-CM

## 2013-06-18 DIAGNOSIS — I480 Paroxysmal atrial fibrillation: Secondary | ICD-10-CM

## 2013-06-18 DIAGNOSIS — I4891 Unspecified atrial fibrillation: Secondary | ICD-10-CM

## 2013-06-19 ENCOUNTER — Emergency Department: Payer: Self-pay

## 2013-06-19 ENCOUNTER — Telehealth: Payer: Self-pay

## 2013-06-19 LAB — CBC
HCT: 35.1 % (ref 35.0–47.0)
HGB: 11.9 g/dL — AB (ref 12.0–16.0)
MCH: 31.2 pg (ref 26.0–34.0)
MCHC: 33.9 g/dL (ref 32.0–36.0)
MCV: 92 fL (ref 80–100)
Platelet: 228 10*3/uL (ref 150–440)
RBC: 3.82 10*6/uL (ref 3.80–5.20)
RDW: 14.3 % (ref 11.5–14.5)
WBC: 4.7 10*3/uL (ref 3.6–11.0)

## 2013-06-19 LAB — TROPONIN I
Troponin-I: <0.02 ng/mL
Troponin-I: <0.02 ng/mL

## 2013-06-19 LAB — BASIC METABOLIC PANEL
ANION GAP: 6 — AB (ref 7–16)
BUN: 19 mg/dL — ABNORMAL HIGH (ref 7–18)
Calcium, Total: 8.8 mg/dL (ref 8.5–10.1)
Chloride: 102 mmol/L (ref 98–107)
Co2: 30 mmol/L (ref 21–32)
Creatinine: 1.04 mg/dL (ref 0.60–1.30)
GFR CALC AF AMER: 60 — AB
GFR CALC NON AF AMER: 51 — AB
GLUCOSE: 150 mg/dL — AB (ref 65–99)
Osmolality: 281 (ref 275–301)
Potassium: 3.3 mmol/L — ABNORMAL LOW (ref 3.5–5.1)
Sodium: 138 mmol/L (ref 136–145)

## 2013-06-19 LAB — URINALYSIS, COMPLETE
BILIRUBIN, UR: NEGATIVE
Bacteria: NONE SEEN
Blood: NEGATIVE
Glucose,UR: NEGATIVE mg/dL (ref 0–75)
Ketone: NEGATIVE
Nitrite: NEGATIVE
PROTEIN: NEGATIVE
Ph: 5 (ref 4.5–8.0)
RBC,UR: <1 /HPF (ref 0–5)
Specific Gravity: 1.005 (ref 1.003–1.030)
Squamous Epithelial: 1

## 2013-06-19 LAB — PRO B NATRIURETIC PEPTIDE: B-TYPE NATIURETIC PEPTID: 165 pg/mL (ref 0–450)

## 2013-06-19 NOTE — Telephone Encounter (Signed)
Spoke w/ pt.  She reports that she has "felt real bad" since Sunday. States that she feels weak, "staggery", dizzy, has vision changes, and she goes to sleep when she sits down for a little while.  She recently cut back on her metoprolol b/c her HR was low.  States that she has all of her meals prepared at [facility] and has had her meals delivered to her this week, as she does not feel like going to the dining room to eat.  States that her "head is totally crazy" and feels heavy w/ frequent headaches. Recent readings: 140/76, 58 135/68, 57 162/68, 67 140/70, 58 Reports that her HR has been regular and she does not feel that she is in afib. She saw her PCP last week and he told her to restart meloxicam.   States this previously made her bleed, but he advised her that this was safer than the amount of Tylenol she was taking daily. She called PCP office yesterday and was told she was most likely dehydrated and to contact our office. She reports that she has been drinking plenty of fluids and had 4 additional bottles of water yesterday, but does not feel any better.  She would like to know Dr. Donivan Scull recommendation, as she feels that her PCP "doesn't want to help". Please advise.  Thank you.

## 2013-06-19 NOTE — Telephone Encounter (Signed)
Pt called and states she is having so many problems and is not feeling good.Weakness, vision changes, when she stands, she feels like she is going to pass out from being weak and dizzy. She did talk with her PCP yesterday, and they directed her to call us. States her HR is "up and down"  States it may be from her medications.

## 2013-06-19 NOTE — Telephone Encounter (Signed)
Would call to see if she would like to be seen She was sent home for the ER 06/19/2013

## 2013-06-19 NOTE — Telephone Encounter (Signed)
Spoke w/ pt.  She states that she is feeling weak, her chest hurts, her HR is 100 and she wanted to let me know that he is calling EMS to take her to the ED. Spoke w/ Dr. Rockey Situ.  Offered to work pt in to be seen in clinic tomorrow, but she states that she cannot wait that long. She states that "something else is going on besides my heart", her head hurts and she feels so weak that she feels that she needs to seek attention immediately. She reports that her brother is on his way to pick her up and take her to the ED. Advised Dr. Rockey Situ of pt's decision.  He is agreeable.  Pt aware that Dr. Rockey Situ is not on call at the hospital this evening, but she would like to proceed to ED. Asked her to call if we can be of further assistance.

## 2013-06-20 NOTE — Telephone Encounter (Signed)
Left message for pt to call back  °

## 2013-06-20 NOTE — Telephone Encounter (Signed)
Pt's daughter called inquiring about pt's appt for today. Advised her that we had offered to work pt in for this afternoon when she called last night, as she needed an eval, but since she was seen in ED last night, we can offer her an appt next week. Daughter is agreeable to this, but states that she does not know why her mother keeps insisting on going to the ED. Reports that pt called her yesterday w/ different symptoms than she reported to [place] or the ED. She is unclear if there is a cognitive aspect to pt's complaints.  She will speak w/ pt's PCP and call us back if we can be of further assistance.

## 2013-07-01 DIAGNOSIS — I10 Essential (primary) hypertension: Secondary | ICD-10-CM | POA: Insufficient documentation

## 2013-07-09 ENCOUNTER — Ambulatory Visit: Payer: Medicare HMO | Admitting: Cardiovascular Disease

## 2013-07-12 ENCOUNTER — Encounter: Payer: Self-pay | Admitting: Cardiovascular Disease

## 2013-07-12 ENCOUNTER — Ambulatory Visit (INDEPENDENT_AMBULATORY_CARE_PROVIDER_SITE_OTHER): Payer: Medicare HMO | Admitting: Cardiovascular Disease

## 2013-07-12 VITALS — BP 102/64 | HR 50 | Ht 62.5 in | Wt 198.0 lb

## 2013-07-12 DIAGNOSIS — I480 Paroxysmal atrial fibrillation: Secondary | ICD-10-CM

## 2013-07-12 DIAGNOSIS — R0602 Shortness of breath: Secondary | ICD-10-CM

## 2013-07-12 DIAGNOSIS — R609 Edema, unspecified: Secondary | ICD-10-CM

## 2013-07-12 DIAGNOSIS — I5032 Chronic diastolic (congestive) heart failure: Secondary | ICD-10-CM

## 2013-07-12 DIAGNOSIS — I4891 Unspecified atrial fibrillation: Secondary | ICD-10-CM

## 2013-07-12 DIAGNOSIS — R0609 Other forms of dyspnea: Secondary | ICD-10-CM

## 2013-07-12 DIAGNOSIS — I509 Heart failure, unspecified: Secondary | ICD-10-CM

## 2013-07-12 DIAGNOSIS — R42 Dizziness and giddiness: Secondary | ICD-10-CM

## 2013-07-12 DIAGNOSIS — I10 Essential (primary) hypertension: Secondary | ICD-10-CM

## 2013-07-12 DIAGNOSIS — R600 Localized edema: Secondary | ICD-10-CM

## 2013-07-12 DIAGNOSIS — R0989 Other specified symptoms and signs involving the circulatory and respiratory systems: Secondary | ICD-10-CM

## 2013-07-12 MED ORDER — METOLAZONE 5 MG PO TABS: 5.0000 mg | ORAL_TABLET | Freq: Two times a day (BID) | ORAL | Status: DC | PRN

## 2013-07-12 NOTE — Progress Notes (Signed)
Patient ID: Hailey [name], female    DOB: 05-Mar-1932, 80 y.o.   MRN: [id number]  HPI Comments: Hailey [name] is a 80 y.o. female w/ PMHx s/f HOCM, h/o DVT (s/p IVC filter), h/o PUD/GIB, PAT, cerebral aneurysm, COPD, pulmonary HTN, HTN, HLD, OSA (intolerant to CPAP, on 2L via n/c), fibromyalgia and anxiety who was admitted to [facility] 2/25 to 04/11/13 for new onset atrial fibrillation w/ RVR.  She has baseline exertional dyspnea- multifactorial due to deconditioning, COPD. Underwent pulmonary rehab in 2014, followed by Dr. Gwenette Greet.  She lives independently at [facility] and experiences baseline DOE- 1-2x/week- having to rest when walking from the elevator to her apartment. In hindsight, these could have been episodes of atrial fibrillation. She has had 2 admissions to the hospital in February and April 2015 for breakthrough arrhythmia. She was not started on anticoagulation given a history of GI bleeding  On her most recent admission in early April 2015, she was started on amiodarone.  In followup today, she reports continued weight gain, leg swelling, abdominal bloating. Weight has increased from 188 pounds 2 months ago up to 194 pounds one month ago, now 198 pounds. Severe leg edema by the end of the day. She is taking torsemide 40 mg twice a day She reports having 2 episodes of atrial fibrillation. 1 was short lived, 1 lasted for some time. Recently started on meloxicam last month for neck pain  CHADSVASc not profoundly high- 4 (HTN, age > 57, female).   h/o profuse GIB in the past, required IVC filter for DVT in the past.   She continues to have severe neck pain, radiating pain up to her head with headaches  EKG: sinus bradycardia with rate 50 beats per minute, nonspecific ST abnormality    Outpatient Encounter Prescriptions as of 07/12/2013  Medication Sig  . Acetaminophen (TYLENOL EXTRA STRENGTH PO) Take by mouth as needed.  Marland Kitchen amiodarone (PACERONE) 200 MG tablet Take 200 mg by mouth daily.    Marland Kitchen dimenhyDRINATE (DRAMAMINE) 50 MG tablet Take 50 mg by mouth every 8 (eight) hours as needed.    . DULoxetine (CYMBALTA) 60 MG capsule Take 90 mg by mouth daily.  . Fluticasone-Salmeterol (ADVAIR DISKUS) 100-50 MCG/DOSE AEPB Inhale 1 puff into the lungs every 12 (twelve) hours.  . gabapentin (NEURONTIN) 100 MG capsule Take 200 mg by mouth every evening. Once daily in evening  . gabapentin (NEURONTIN) 300 MG capsule Take 300 mg by mouth every morning. Once daily in am  . gabapentin (NEURONTIN) 400 MG capsule Take 400 mg by mouth at bedtime.  Marland Kitchen loratadine (CLARITIN) 10 MG tablet Take 10 mg by mouth daily.  Marland Kitchen lovastatin (MEVACOR) 40 MG tablet Take 40 mg by mouth daily.    . Melatonin 3 MG TABS Take 1 tablet by mouth at bedtime.    . meloxicam (MOBIC) 7.5 MG tablet Take 7.5 mg by mouth daily.  . metoprolol tartrate (LOPRESSOR) 25 MG tablet Take 25 mg by mouth 2 (two) times daily.  . pantoprazole (PROTONIX) 40 MG tablet Take 40 mg by mouth daily.  . polyethylene glycol (MIRALAX / GLYCOLAX) packet Take 17 g by mouth daily.  . Polyethylene Glycol 3350 POWD    . potassium chloride (K-DUR) 10 MEQ tablet Take 1 tablet (10 mEq total) by mouth 2 (two) times daily as needed.  . pramipexole (MIRAPEX) 0.25 MG tablet Take 1 tablet by mouth daily.  . sucralfate (CARAFATE) 1 G tablet Take 1 g by mouth 4 (four)  times daily -  with meals and at bedtime.   Marland Kitchen tiotropium (SPIRIVA) 18 MCG inhalation capsule Place 18 mcg into inhaler and inhale as needed.    . torsemide (DEMADEX) 20 MG tablet Take 80 mg by mouth daily.   . metolazone (ZAROXOLYN) 5 MG tablet Take 1 tablet (5 mg total) by mouth 2 (two) times daily as needed.    Review of Systems  Constitutional: Positive for fatigue.  HENT: Negative.   Eyes: Negative.   Respiratory: Positive for shortness of breath.   Cardiovascular: Positive for leg swelling.  Gastrointestinal: Negative.   Endocrine: Negative.   Musculoskeletal: Negative.   Skin: Negative.    Allergic/Immunologic: Negative.   Neurological: Positive for weakness.  Hematological: Negative.   Psychiatric/Behavioral: Negative.   All other systems reviewed and are negative.   BP 102/64  Pulse 50  Ht 5' 2.5" (1.588 m)  Wt 198 lb (89.812 kg)  BMI 35.62 kg/m2  Physical Exam  Nursing note and vitals reviewed. Constitutional: She is oriented to person, place, and time. She appears well-developed and well-nourished.  HENT:  Head: Normocephalic.  Nose: Nose normal.  Mouth/Throat: Oropharynx is clear and moist.  Eyes: Conjunctivae are normal. Pupils are equal, round, and reactive to light.  Neck: Normal range of motion. Neck supple. No JVD present.  Cardiovascular: Normal rate, regular rhythm, S1 normal, S2 normal and intact distal pulses.  Exam reveals no gallop and no friction rub.   Murmur heard.  Systolic murmur is present with a grade of 1/6  Trace pitting edema to the thighs, abdominal bloating suggested by size of her abdomen  Pulmonary/Chest: Effort normal and breath sounds normal. No respiratory distress. She has no wheezes. She has no rales. She exhibits no tenderness.  Abdominal: Soft. Bowel sounds are normal. She exhibits no distension. There is no tenderness.  Musculoskeletal: Normal range of motion. She exhibits no edema and no tenderness.  Lymphadenopathy:    She has no cervical adenopathy.  Neurological: She is alert and oriented to person, place, and time. Coordination normal.  Skin: Skin is warm and dry. No rash noted. No erythema.  Psychiatric: She has a normal mood and affect. Her behavior is normal. Judgment and thought content normal.    Assessment and Plan

## 2013-07-12 NOTE — Assessment & Plan Note (Signed)
10 pound weight gain over the past 2 months. He reports dramatic worsening of her leg edema, abdominal bloating. We will start metolazone 5 mg with torsemide 40 mg 30 minutes later in the morning, continue torsemide 40 mg afternoon. We'll check a basic metabolic panel and BNP. If no improvement in her weight, we'll order echocardiogram. Unable to exclude other etiology such as thromboembolic disease. She does have a history of DVTs in the past, filter placed

## 2013-07-12 NOTE — Assessment & Plan Note (Signed)
Blood pressure very low. We have recommended she decrease her metoprolol down to one half pill twice a day

## 2013-07-12 NOTE — Assessment & Plan Note (Signed)
Worsening shortness of breath with exertion. Given the 10 pound weight gain, abdominal and leg swelling, concern for acute on chronic diastolic CHF. We'll change her diuretics as above

## 2013-07-12 NOTE — Assessment & Plan Note (Signed)
Maintaining normal sinus rhythm. Rare episodes of atrial fibrillation on the amiodarone. Heart rate is very low. We will decrease the metoprolol down to 12.5 mg twice a day.

## 2013-07-12 NOTE — Patient Instructions (Addendum)
Please add metolazone 5 mg in the AM, 30 minutes before torsemide 40 mg (or 60 mg) Continue the torsemide 40 in the PM  Please cut the metoprolol in 1/2 twice a day (blood pressure and heart rate are low)  If you have atrial fibrillation, Take an extra amiodarone, relax on the bed  We will check blood work today  Please call us if you have new issues that need to be addressed before your next appt.  Your physician wants you to follow-up in: 1 month.

## 2013-07-13 LAB — BRAIN NATRIURETIC PEPTIDE: BNP: 55.7 pg/mL (ref 0.0–100.0)

## 2013-07-13 LAB — BASIC METABOLIC PANEL
BUN / CREAT RATIO: 18 (ref 11–26)
BUN: 18 mg/dL (ref 8–27)
CO2: 21 mmol/L (ref 18–29)
CREATININE: 1.02 mg/dL — AB (ref 0.57–1.00)
Calcium: 9.5 mg/dL (ref 8.7–10.3)
Chloride: 100 mmol/L (ref 97–108)
GFR calc Af Amer: 61 mL/min/{1.73_m2} (ref >59)
GFR, EST NON AFRICAN AMERICAN: 53 mL/min/{1.73_m2} — AB (ref >59)
Glucose: 131 mg/dL — ABNORMAL HIGH (ref 65–99)
Potassium: 4.3 mmol/L (ref 3.5–5.2)
Sodium: 140 mmol/L (ref 134–144)

## 2013-07-15 ENCOUNTER — Telehealth: Payer: Self-pay

## 2013-07-15 NOTE — Telephone Encounter (Signed)
Pt daughter called and states pt says she has lost 10 pounds in 24 hours, states her BP and HR is "all over the place". Has questions about medication. Pt daughter states she has no way to verify she has lost 10 pounds. Pt states she feels awful. Please call.

## 2013-07-15 NOTE — Telephone Encounter (Signed)
Spoke w/ pt.  She reports that she has lost 10 lbs in 24 hrs, down to 188lbs from 198 yesterday. States that she is weak and dizzy, she is using her walker to prevent herself from falling.  BP today 143/80 sitting, 89/65 on standing. HR b/t 68-82 depending on what she is doing.  States that she is nauseous and has a HA.  Could not go down to eat today, so she had her lunch delivered to her apartment. On ov 5/29, pt was instructed to:  "Please add metolazone 5 mg in the AM, 30 minutes before torsemide 40 mg (or 60 mg)   Continue the torsemide 40 in the PM   Please cut the metoprolol in 1/2 twice a day   (blood pressure and heart rate are low)   If you have atrial fibrillation,   Take an extra amiodarone, relax on the bed" She has cut her metoprolol down to 1/4 pill BID, as she was previously taking 1/2 25mg  pill BID.  Please advise.  Thank you.

## 2013-07-16 NOTE — Telephone Encounter (Signed)
Spoke w/ pt.  Advised her of Dr. Donivan Scull recommendation.  She verbalizes understanding and is agreeable.   States that she has had very little to eat or drink so far today.   She had her lunch brought to her and will try to increase her fluids. Asked pt to call me tomorrow and let me know how she is doing.

## 2013-07-16 NOTE — Telephone Encounter (Signed)
Pt's daughter, Berdine Addison, called stating that pt sounds very weak this morning. She is concerned that pt may have lost too much fluid over too short a period of time. She is questioning if pt's report of 10lb wt loss is correct.  Pt did not take her metolazone this am.  Daughter states that pt is "very exaggerated" at times, but feels that pt genuinely does not feel good.  Offered her appt to bring pt in to see Ignacia Bayley, NP, as she states that she would like to verify pt's wt on our scales.  She states that she would prefer a call back w/ Dr. Donivan Scull recommendation, as she feels that he knows pt's "condition". Please advise.  Thank you.

## 2013-07-16 NOTE — Telephone Encounter (Signed)
Stop the metolazone and use only a 1/2 pill for when legs are swollen and weight is high as she reported in clinic  Hold torsemide today and tomorrow (or two days) Drink some fluids. She sounds dehydrated.

## 2013-07-17 ENCOUNTER — Inpatient Hospital Stay: Payer: Self-pay | Admitting: Student

## 2013-07-17 ENCOUNTER — Ambulatory Visit: Payer: Medicare HMO | Admitting: Nurse Practitioner

## 2013-07-17 LAB — COMPREHENSIVE METABOLIC PANEL
ALT: 23 U/L (ref 12–78)
AST: 33 U/L (ref 15–37)
Albumin: 4.3 g/dL (ref 3.4–5.0)
Alkaline Phosphatase: 133 U/L — ABNORMAL HIGH
Anion Gap: 12 (ref 7–16)
BUN: 31 mg/dL — ABNORMAL HIGH (ref 7–18)
Bilirubin,Total: 0.4 mg/dL (ref 0.2–1.0)
CHLORIDE: 86 mmol/L — AB (ref 98–107)
CREATININE: 0.87 mg/dL (ref 0.60–1.30)
Calcium, Total: 9.7 mg/dL (ref 8.5–10.1)
Co2: 29 mmol/L (ref 21–32)
EGFR (African American): >60
EGFR (Non-African Amer.): >60
Glucose: 124 mg/dL — ABNORMAL HIGH (ref 65–99)
OSMOLALITY: 263 (ref 275–301)
POTASSIUM: 2.7 mmol/L — AB (ref 3.5–5.1)
SODIUM: 127 mmol/L — AB (ref 136–145)
Total Protein: 8 g/dL (ref 6.4–8.2)

## 2013-07-17 LAB — URINALYSIS, COMPLETE
BLOOD: NEGATIVE
Bilirubin,UR: NEGATIVE
GLUCOSE, UR: NEGATIVE mg/dL (ref 0–75)
KETONE: NEGATIVE
Nitrite: NEGATIVE
Ph: 6 (ref 4.5–8.0)
Protein: NEGATIVE
Specific Gravity: 1.013 (ref 1.003–1.030)
Transitional Epi: 1
WBC UR: 3 /HPF (ref 0–5)

## 2013-07-17 LAB — CBC
HCT: 42.7 % (ref 35.0–47.0)
HGB: 14.4 g/dL (ref 12.0–16.0)
MCH: 30.8 pg (ref 26.0–34.0)
MCHC: 33.7 g/dL (ref 32.0–36.0)
MCV: 91 fL (ref 80–100)
PLATELETS: 230 10*3/uL (ref 150–440)
RBC: 4.67 10*6/uL (ref 3.80–5.20)
RDW: 14.1 % (ref 11.5–14.5)
WBC: 8 10*3/uL (ref 3.6–11.0)

## 2013-07-17 LAB — MAGNESIUM: MAGNESIUM: 2.6 mg/dL — AB

## 2013-07-17 LAB — CK TOTAL AND CKMB (NOT AT ARMC)
CK, TOTAL: 199 U/L — AB
CK-MB: 4.6 ng/mL — AB (ref 0.5–3.6)

## 2013-07-17 LAB — TROPONIN I: Troponin-I: <0.02 ng/mL

## 2013-07-17 NOTE — Telephone Encounter (Signed)
Left message for pt to call back  °

## 2013-07-17 NOTE — Telephone Encounter (Signed)
Spoke w/ pt.  She reports that she is not feeling any better and that she "passed out last night". She is concerned that she is dehydrated, as she is very weak.  Offered pt to come in this afternoon to see Hailey Bayley, NP. She would like to know what we can do for her if she comes in to the office.  Discussed w/ pt that she will most likely need labs drawn and possibly fluids if she is indeed dehydrated, but this will need to be evaluated.  Advised her that if she does not feel better, she can go to Urgent Care, but would recommend that she seek care, esp if she passed out.  Pt sched to see Hailey [name] this afternoon at 2:15.  She will call her daughter to make sure that she can keep this appt.

## 2013-07-18 LAB — BASIC METABOLIC PANEL
ANION GAP: 8 (ref 7–16)
BUN: 25 mg/dL — ABNORMAL HIGH (ref 7–18)
CALCIUM: 9.5 mg/dL (ref 8.5–10.1)
CO2: 32 mmol/L (ref 21–32)
CREATININE: 0.81 mg/dL (ref 0.60–1.30)
Chloride: 92 mmol/L — ABNORMAL LOW (ref 98–107)
EGFR (African American): >60
GLUCOSE: 123 mg/dL — AB (ref 65–99)
Osmolality: 270 (ref 275–301)
POTASSIUM: 2.9 mmol/L — AB (ref 3.5–5.1)
Sodium: 132 mmol/L — ABNORMAL LOW (ref 136–145)

## 2013-07-18 LAB — CBC WITH DIFFERENTIAL/PLATELET
BASOS ABS: 0 10*3/uL (ref 0.0–0.1)
BASOS PCT: 0.3 %
Eosinophil #: 0.1 10*3/uL (ref 0.0–0.7)
Eosinophil %: 2.2 %
HCT: 36.6 % (ref 35.0–47.0)
HGB: 12.4 g/dL (ref 12.0–16.0)
LYMPHS ABS: 1.6 10*3/uL (ref 1.0–3.6)
LYMPHS PCT: 23.7 %
MCH: 31.1 pg (ref 26.0–34.0)
MCHC: 34 g/dL (ref 32.0–36.0)
MCV: 92 fL (ref 80–100)
MONOS PCT: 13.7 %
Monocyte #: 0.9 x10 3/mm (ref 0.2–0.9)
NEUTROS ABS: 4 10*3/uL (ref 1.4–6.5)
NEUTROS PCT: 60.1 %
PLATELETS: 203 10*3/uL (ref 150–440)
RBC: 3.99 10*6/uL (ref 3.80–5.20)
RDW: 13.9 % (ref 11.5–14.5)
WBC: 6.6 10*3/uL (ref 3.6–11.0)

## 2013-07-18 LAB — SEDIMENTATION RATE: Erythrocyte Sed Rate: 17 mm/hr (ref 0–30)

## 2013-07-18 LAB — CK-MB
CK-MB: 8.2 ng/mL — AB (ref 0.5–3.6)
CK-MB: 9.6 ng/mL — ABNORMAL HIGH (ref 0.5–3.6)

## 2013-07-18 LAB — TROPONIN I
Troponin-I: <0.02 ng/mL
Troponin-I: <0.02 ng/mL

## 2013-07-19 ENCOUNTER — Ambulatory Visit: Payer: Self-pay | Admitting: Neurology

## 2013-07-19 LAB — BASIC METABOLIC PANEL
Anion Gap: 5 — ABNORMAL LOW (ref 7–16)
BUN: 16 mg/dL (ref 7–18)
CO2: 31 mmol/L (ref 21–32)
CREATININE: 0.86 mg/dL (ref 0.60–1.30)
Calcium, Total: 9.7 mg/dL (ref 8.5–10.1)
Chloride: 100 mmol/L (ref 98–107)
EGFR (Non-African Amer.): >60
Glucose: 172 mg/dL — ABNORMAL HIGH (ref 65–99)
OSMOLALITY: 277 (ref 275–301)
POTASSIUM: 3.5 mmol/L (ref 3.5–5.1)
Sodium: 136 mmol/L (ref 136–145)

## 2013-07-19 LAB — MAGNESIUM: Magnesium: 2.4 mg/dL

## 2013-07-20 LAB — POTASSIUM: Potassium: 4 mmol/L (ref 3.5–5.1)

## 2013-07-20 LAB — MAGNESIUM: MAGNESIUM: 1.9 mg/dL

## 2013-07-21 LAB — URINE CULTURE

## 2013-08-05 ENCOUNTER — Ambulatory Visit (INDEPENDENT_AMBULATORY_CARE_PROVIDER_SITE_OTHER): Payer: Medicare HMO | Admitting: Nurse Practitioner

## 2013-08-05 ENCOUNTER — Telehealth: Payer: Self-pay | Admitting: *Deleted

## 2013-08-05 ENCOUNTER — Encounter: Payer: Self-pay | Admitting: Nurse Practitioner

## 2013-08-05 VITALS — BP 132/74 | HR 81 | Ht 62.0 in | Wt 188.5 lb

## 2013-08-05 DIAGNOSIS — R5383 Other fatigue: Secondary | ICD-10-CM

## 2013-08-05 DIAGNOSIS — R079 Chest pain, unspecified: Secondary | ICD-10-CM

## 2013-08-05 DIAGNOSIS — E876 Hypokalemia: Secondary | ICD-10-CM

## 2013-08-05 DIAGNOSIS — I480 Paroxysmal atrial fibrillation: Secondary | ICD-10-CM

## 2013-08-05 DIAGNOSIS — I509 Heart failure, unspecified: Secondary | ICD-10-CM

## 2013-08-05 DIAGNOSIS — R5381 Other malaise: Secondary | ICD-10-CM

## 2013-08-05 DIAGNOSIS — I5032 Chronic diastolic (congestive) heart failure: Secondary | ICD-10-CM

## 2013-08-05 DIAGNOSIS — I4891 Unspecified atrial fibrillation: Secondary | ICD-10-CM

## 2013-08-05 DIAGNOSIS — R531 Weakness: Secondary | ICD-10-CM

## 2013-08-05 DIAGNOSIS — I422 Other hypertrophic cardiomyopathy: Secondary | ICD-10-CM

## 2013-08-05 DIAGNOSIS — I951 Orthostatic hypotension: Secondary | ICD-10-CM

## 2013-08-05 LAB — BASIC METABOLIC PANEL
BUN/Creatinine Ratio: 29 — ABNORMAL HIGH (ref 11–26)
BUN: 44 mg/dL — AB (ref 8–27)
CALCIUM: 10.3 mg/dL (ref 8.7–10.3)
CO2: 26 mmol/L (ref 18–29)
CREATININE: 1.53 mg/dL — AB (ref 0.57–1.00)
Chloride: 84 mmol/L — ABNORMAL LOW (ref 97–108)
GFR calc Af Amer: 37 mL/min/{1.73_m2} — ABNORMAL LOW (ref >59)
GFR calc non Af Amer: 32 mL/min/{1.73_m2} — ABNORMAL LOW (ref >59)
GLUCOSE: 230 mg/dL — AB (ref 65–99)
Potassium: 3.1 mmol/L — ABNORMAL LOW (ref 3.5–5.2)
Sodium: 134 mmol/L (ref 134–144)

## 2013-08-05 NOTE — Progress Notes (Signed)
Patient Name: Hailey [name] Date of Encounter: 08/05/2013  Primary Care Provider:  Marcello Fennel, MD Primary Cardiologist:  Johnny Bridge, MD   Patient Profile  80 y/o female with a h/o hypertrophic CM, diast CHF, PAF, and DVT s/p IVC filter, who presents today with complaints of wkns.  Problem List   Past Medical History  Diagnosis Date  . DVT (deep vein thrombosis) in pregnancy 09/2008    a. LLE, recurrent hx, has IVC filter. Not on coumadin now with history of GI bleeding  . Hyperlipidemia   . GERD (gastroesophageal reflux disease)   . Hypertrophic cardiomyopathy     a. Echo 4/09 with EF 70-75%, asymmetricy basal septal hypertrophy, mild MR without systolic anterior motion of the mitral valve, LVOT gradient to 130 mmHg with Valsalva b. Echo 7/13: EF 60-65%, mild focal basal septal hypertrophy, no significant LVOT gradient, no MV SAM c. Echo 2/15: EF 55-60%, HOCM, resting LVOT gradient 29 mmHg, Valsalva LVOT gradient > 140 mmHg, mild LVH, mild TR, elevated PASP  . Anemia     Chronic  . Osteoarthritis     Hx of left TKR  . Fibromyalgia   . Cerebral aneurysm     Hx of  . Migraine headache     Hx of  . Hypertension   . COPD (chronic obstructive pulmonary disease)   . PUD (peptic ulcer disease)     With GI bleeding  . History of colonoscopy   . PAF (paroxysmal atrial fibrillation)     a. Full-dose ASA alone 2/2 h/o GIB.  Marland Kitchen OSA (obstructive sleep apnea)     a. Intolerant to CPAP, wears 2L via n/c  . Fibromyalgia   . Anxiety   . PAT (paroxysmal atrial tachycardia)    Past Surgical History  Procedure Laterality Date  . Total knee arthroplasty      Left  . Bladder suspension    . Foot surgery      Bilateral  . Inguinal hernia repair      Right  . Cerebral aneurysm repair    . Abdominal hysterectomy    . Appendectomy    . Tonsillectomy and adenoidectomy    . Shoulder surgery      Right  . Knee arthroscopy      Right  . Cardiac catheterization  2009    No  significant CAD  . Cardiovascular stress test      a. Lexiscan Myoview 3/14: EF 76%, no evidence of ischemia or WMAs    Allergies  Allergies  Allergen Reactions  . Aspirin     Other reaction(s): Bleeding  . Codeine     REACTION: causes nausea  . Compazine Other (See Comments)    ? Confusion per patient  . Ketorolac Tromethamine     toradol  . Macrodantin [Nitrofurantoin Macrocrystal]     Rash   . Nitrofurantoin     REACTION: causes rash  . Nsaids   . Phenazopyridine Hcl     REACTION: causes vision problems pyridum   . Pyridium Plus [Phenazopyridine-Butabarb-Hyosc]     HPI  80 y/o female with the above problem list.  She has a h/o HOCM and diast CHF with a very narrow euvolemic window.  She was hospitalized earlier this month for hyponatremia/kalemia and volume depletion.  Following hydration and electrolyte correction, she was discharged home on torsemide 80mg  daily and metolazone 2.5 bid prn wt gain.  She says that she generally does OK when her weight is around 189.  Last Wed, wt had gotten up to ~ 198. She took 2.5 of metolazone x 2 that day and since, her wt has come down to 188.  In that setting, she has felt much more weak and washed out - similar to the way she felt prior to her hospitalization for hypokalemia.  She says that when she stands up in the AM, she feels lightheaded and her HR rises into the low 90's associated with palpitations and chest pressure - both Ss that she has had for many years.  She denies chest pain, dyspnea, pnd, orthopnea, n, v, dizziness, syncope, edema, weight gain, or early satiety.   Home Medications  Prior to Admission medications   Medication Sig Start Date End Date Taking? Authorizing Provider  Acetaminophen (TYLENOL EXTRA STRENGTH PO) Take by mouth as needed.   Yes Historical Provider, MD  amiodarone (PACERONE) 200 MG tablet Take 200 mg by mouth daily.    Yes Historical Provider, MD  dimenhyDRINATE (DRAMAMINE) 50 MG tablet Take 50 mg by  mouth every 8 (eight) hours as needed.     Yes Historical Provider, MD  DULoxetine (CYMBALTA) 60 MG capsule Take 90 mg by mouth daily.   Yes Historical Provider, MD  Fluticasone-Salmeterol (ADVAIR DISKUS) 100-50 MCG/DOSE AEPB Inhale 1 puff into the lungs every 12 (twelve) hours. 01/06/12  Yes Kathee Delton, MD  gabapentin (NEURONTIN) 100 MG capsule Take 200 mg by mouth every evening. Once daily in evening   Yes Historical Provider, MD  gabapentin (NEURONTIN) 300 MG capsule Take 300 mg by mouth every morning. Once daily in am   Yes Historical Provider, MD  gabapentin (NEURONTIN) 400 MG capsule Take 400 mg by mouth at bedtime.   Yes Historical Provider, MD  loratadine (CLARITIN) 10 MG tablet Take 10 mg by mouth daily.   Yes Historical Provider, MD  lovastatin (MEVACOR) 40 MG tablet Take 40 mg by mouth daily.     Yes Historical Provider, MD  Melatonin 3 MG TABS Take 1 tablet by mouth at bedtime.     Yes Historical Provider, MD  meloxicam (MOBIC) 7.5 MG tablet Take 7.5 mg by mouth daily.   Yes Historical Provider, MD  metolazone (ZAROXOLYN) 5 MG tablet Take 1 tablet (5 mg total) by mouth 2 (two) times daily as needed. 07/12/13  Yes Minna Merritts, MD  metoprolol succinate (TOPROL-XL) 25 MG 24 hr tablet Take 25 mg by mouth daily.   Yes Historical Provider, MD  pantoprazole (PROTONIX) 40 MG tablet Take 40 mg by mouth daily.   Yes Historical Provider, MD  polyethylene glycol (MIRALAX / GLYCOLAX) packet Take 17 g by mouth daily.   Yes Historical Provider, MD  potassium chloride (K-DUR) 10 MEQ tablet Take 20 mEq by mouth 3 (three) times daily.  06/06/13  Yes Minna Merritts, MD  pramipexole (MIRAPEX) 0.25 MG tablet Take 1 tablet by mouth daily. 03/17/12  Yes Historical Provider, MD  sucralfate (CARAFATE) 1 G tablet Take 1 g by mouth 4 (four) times daily -  with meals and at bedtime.  10/14/11  Yes Historical Provider, MD  tiotropium (SPIRIVA) 18 MCG inhalation capsule Place 18 mcg into inhaler and inhale as  needed.     Yes Historical Provider, MD  torsemide (DEMADEX) 20 MG tablet Take 80 mg by mouth daily.  11/18/10  Yes Larey Dresser, MD    Review of Systems  Weakness and orthostatic lightheadedness as above.  All other systems reviewed and are otherwise negative except as noted  above.  Physical Exam  Blood pressure 132/74, pulse 81, height 5\' 2"  (1.575 m), weight 188 lb 8 oz (85.503 kg).  Lying: 153/79, 80 Sitting 128/76, 82 (dizzy) Standing: 139/78, 78 (0 mins - dizzy) Standing: 142/79, 86 (5 mins) General: Pleasant, NAD Psych: Normal affect. Neuro: Alert and oriented X 3. Moves all extremities spontaneously. HEENT: Normal  Neck: Supple without bruits or JVD. Lungs:  Resp regular and unlabored, crackles right base. Heart: RRR no s3, s4, 3/6 mid-systolic murmur heard throughout - loudest @ the llsb. Abdomen: Soft, non-tender, non-distended, BS + x 4.  Extremities: No clubbing, cyanosis or edema. DP/PT/Radials 2+ and equal bilaterally.  Accessory Clinical Findings  ECG - rsr, 81, diffuse ST depression of up to 62mm - unchanged from prior.  Assessment & Plan  1.  Symptomatic orthostatic hypotension/Weakness:  Pt presents with recurrent orthostasic symptoms with evidence of orthostatic hypotension on exam.  She has a h/o HOCM as well as diast CHF and as a result, her volume mgmt is challenging.  She was recently admitted with dehydration and hyponatremia/hypokalemia.  She has't taken metolazone since last Wednesday but says that since taking it, her wt is down about 11 lbs and she has been weak and orthostatic.  I will check a bmet STAT today.  Further recs pending this result.  In the past, when she was on less torsemide, she required more metolazone.  It is notable that she was previously on lopressor 25mg  1/4 tab bid and was discharged home on toprol xl 25mg  daily.  If labs do not suggest dehydration or metabolic derangement, I will cut her toprol xl in 1/2.  2.   Hypokalemia/hyponatremia: Recent hospitalization in the setting of overdiuresis.  Follow-up bmet today.  3.  Hypertrophic cardiomyopathy:  Most recent echo was earlier this year - result above.  Volume mgmt difficult as above.  Fine line between appropriate hydration and dehydration. F/U bmet today.  4.  HTN:  See #1.  5.  PAF:  In sinus.  No anticoagulation 2/2 h/o GIB.  6.  Chronic diastolic CHF:  See above.  Wt stable @ 188. Says dry weight usually 189.  HR/BP stable.  F/u bmet today.  7.  Dispo:  BMET today.  F/U next week.    Murray Hodgkins, NP 08/05/2013, 3:56 PM

## 2013-08-05 NOTE — Telephone Encounter (Signed)
Spoke w/pt. °

## 2013-08-05 NOTE — Telephone Encounter (Signed)
Spoke w/ pt.  She is inquiring if we can send her to the ED, as she is not feeling well.  Reports that she has been taking 1/2 of her torsemide and has lost 13lbs since Fri.  Reports that she does not feel well, when she stands, her HR "goes up to the 90s", she feels completely wiped out w/ weakness and nausea.  She repeatedly asks to go to the ED, as she feels that she needs fluids.  Asked pt to come in to see Ignacia Bayley, NP this pm, to avoid going to ED. She is agreeable and will come in today at 1:45.

## 2013-08-05 NOTE — Patient Instructions (Signed)
Your physician recommends that you have labs today:  BMP  Your physician recommends that you schedule a follow-up appointment in:  1 week with Ignacia Bayley NP   We will call you with lab results

## 2013-08-05 NOTE — Telephone Encounter (Signed)
Patient still swelling in feet/legs and very weak, heart racing and sob.

## 2013-08-06 ENCOUNTER — Other Ambulatory Visit: Payer: Self-pay

## 2013-08-06 ENCOUNTER — Telehealth: Payer: Self-pay | Admitting: *Deleted

## 2013-08-06 ENCOUNTER — Encounter: Payer: Self-pay | Admitting: *Deleted

## 2013-08-06 DIAGNOSIS — I5032 Chronic diastolic (congestive) heart failure: Secondary | ICD-10-CM

## 2013-08-06 DIAGNOSIS — I422 Other hypertrophic cardiomyopathy: Secondary | ICD-10-CM

## 2013-08-06 MED ORDER — TORSEMIDE 20 MG PO TABS: ORAL_TABLET | ORAL | Status: DC

## 2013-08-06 MED ORDER — POTASSIUM CHLORIDE ER 10 MEQ PO TBCR: 40.0000 meq | EXTENDED_RELEASE_TABLET | Freq: Three times a day (TID) | ORAL | Status: DC

## 2013-08-06 NOTE — Telephone Encounter (Signed)
Reviewed instructions with patient  Patient verbalized understanding  She stated she prefers 10 meq of potassium because they are smaller

## 2013-08-06 NOTE — Telephone Encounter (Signed)
Message copied by Tracie Harrier on Tue Aug 06, 2013  9:13 AM ------      Message from: Rogelia Mire      Created: Tue Aug 06, 2013  8:54 AM       BUN/Creat up suggesting dehydration.  She is currently on torsemide 80 daily.  She should hold torsemide today and resume at 60 mg (3, 20mg  tabs) daily starting tomorrow.  If wt is up 2 lbs in one day, she should take an extra 20mg  for total of 80mg  daily, but only if weight is up.  She will need to increase her potassium to 40 TID.  Pls send in a new Rx (either 20's or 40's) for her as she is currently using 10 meq tabs - unless she prefers to use 10 meq tabs.  She will need f/u bmet on Thursday or Friday.  Please reassure her that there is nothing on this bmet that requires hospitalization. ------

## 2013-08-08 ENCOUNTER — Other Ambulatory Visit: Payer: Medicare HMO

## 2013-08-09 DIAGNOSIS — F419 Anxiety disorder, unspecified: Secondary | ICD-10-CM | POA: Insufficient documentation

## 2013-08-09 DIAGNOSIS — I509 Heart failure, unspecified: Secondary | ICD-10-CM | POA: Insufficient documentation

## 2013-08-09 DIAGNOSIS — G8929 Other chronic pain: Secondary | ICD-10-CM | POA: Insufficient documentation

## 2013-08-14 ENCOUNTER — Ambulatory Visit (INDEPENDENT_AMBULATORY_CARE_PROVIDER_SITE_OTHER): Payer: Commercial Managed Care - HMO | Admitting: Cardiovascular Disease

## 2013-08-14 ENCOUNTER — Other Ambulatory Visit: Payer: Self-pay | Admitting: Cardiovascular Disease

## 2013-08-14 ENCOUNTER — Encounter: Payer: Self-pay | Admitting: Cardiovascular Disease

## 2013-08-14 VITALS — BP 140/70 | HR 58 | Ht 62.0 in | Wt 197.2 lb

## 2013-08-14 DIAGNOSIS — R609 Edema, unspecified: Secondary | ICD-10-CM

## 2013-08-14 DIAGNOSIS — R600 Localized edema: Secondary | ICD-10-CM

## 2013-08-14 DIAGNOSIS — R0989 Other specified symptoms and signs involving the circulatory and respiratory systems: Secondary | ICD-10-CM

## 2013-08-14 DIAGNOSIS — E785 Hyperlipidemia, unspecified: Secondary | ICD-10-CM

## 2013-08-14 DIAGNOSIS — I10 Essential (primary) hypertension: Secondary | ICD-10-CM

## 2013-08-14 DIAGNOSIS — I4891 Unspecified atrial fibrillation: Secondary | ICD-10-CM

## 2013-08-14 DIAGNOSIS — I480 Paroxysmal atrial fibrillation: Secondary | ICD-10-CM

## 2013-08-14 DIAGNOSIS — R0609 Other forms of dyspnea: Secondary | ICD-10-CM

## 2013-08-14 DIAGNOSIS — I509 Heart failure, unspecified: Secondary | ICD-10-CM

## 2013-08-14 DIAGNOSIS — I5032 Chronic diastolic (congestive) heart failure: Secondary | ICD-10-CM

## 2013-08-14 NOTE — Assessment & Plan Note (Signed)
Interestingly after diuresis her shortness of breath and leg edema significantly improved.

## 2013-08-14 NOTE — Assessment & Plan Note (Addendum)
Most recent creatinine several days ago was well above her baseline, 1.6 with BUN 50, potassium 3.0. Is clearly dehydrated. Also having symptoms of lightheadedness, unsteady gait. We have suggested she hold her diuretic for 4 days. Currently has no shortness of breath or leg edema. We have recommended she try to follow the algorithm below:  Please hold the torsemide for four days Current weight is 193 pounds at home take: torsemide 20 mg for weight 197 - 199 pounds Take torsemide 40 mg for weight 200 - 202 Take torsemide  60 mg for weight 203 to 205 Take torsemide 80 mg for weight 206  Call the office for weight >208  (we would add 1/2 metolazone pill with torsemide)

## 2013-08-14 NOTE — Assessment & Plan Note (Signed)
Symptoms concerning for atrial fibrillation. We'll continue her on low-dose amiodarone. He denies having any significant tachycardia

## 2013-08-14 NOTE — Progress Notes (Signed)
Patient ID: Hailey [name], female    DOB: February 20, 1932, 80 y.o.   MRN: [id number]  HPI Comments: DEMIYAH [name] is a 80 y.o. female w/ PMHx s/f HOCM, h/o DVT (s/p IVC filter), h/o PUD/GIB, PAT, cerebral aneurysm, COPD, pulmonary HTN, HTN, HLD, OSA (intolerant to CPAP, on 2L via n/c), fibromyalgia and anxiety who was admitted to [facility] 2/25 to 04/11/13 for new onset atrial fibrillation w/ RVR. baseline exertional dyspnea- multifactorial due to deconditioning, COPD. Underwent pulmonary rehab in 2014, followed by Dr. Gwenette Greet. History of paroxysmal atrial fibrillation.She was not started on anticoagulation given a history of GI bleeding.  h/o profuse GIB in the past, required IVC filter for DVT in the past.   Admitted to the hospital February 2015, April 2015, again in June In June on a clinic visit she reported having severe shortness of breath, leg swelling, weight gain. Torsemide 80 mg was not working for her. Metolazone was added. She started on metolazone and after one dose, had rapid 10 pound weight loss per the patient. She then took only half pills and reported having minimal diuresis. She did present to the hospital she did not feel well. Since her discharge, she has had creatinine above her baseline. Lab work 08/06/2011 showing creatinine 1.4, elevated BUN. At this time she was on torsemide 80 mg daily. It was decreased down to 60 mg daily Lab work 08/08/2013 showed creatinine 1.6, BUN 50 with potassium 3.0. Torsemide dosing decreased down to 40 mg daily To this. She has not felt well. She's had lightheadedness, periods of tachycardia, feel shaky, hands jerking and dropping things. She denies having any significant shortness of breath or leg edema.She reports that her leg swelling improved with the diuresis. Now She keeps her legs elevated She's started to check her weight at home and write it down in her book. Weight is now 193 pounds  history ofsevere neck pain, radiating pain up to her head with  headaches  EKG: sinus bradycardia with rate 58 beats per minute, nonspecific ST abnormality    Outpatient Encounter Prescriptions as of 08/14/2013  Medication Sig  . Acetaminophen (TYLENOL EXTRA STRENGTH PO) Take by mouth as needed.  Marland Kitchen amiodarone (PACERONE) 200 MG tablet Take 200 mg by mouth daily.   Marland Kitchen dimenhyDRINATE (DRAMAMINE) 50 MG tablet Take 50 mg by mouth every 8 (eight) hours as needed.    . DULoxetine (CYMBALTA) 60 MG capsule Take 90 mg by mouth daily.  . Fluticasone-Salmeterol (ADVAIR DISKUS) 100-50 MCG/DOSE AEPB Inhale 1 puff into the lungs every 12 (twelve) hours.  . gabapentin (NEURONTIN) 100 MG capsule Take 200 mg by mouth every evening. Once daily in evening  . gabapentin (NEURONTIN) 300 MG capsule Take 300 mg by mouth every morning. Once daily in am  . gabapentin (NEURONTIN) 400 MG capsule Take 400 mg by mouth at bedtime.  Marland Kitchen loratadine (CLARITIN) 10 MG tablet Take 10 mg by mouth daily.  Marland Kitchen lovastatin (MEVACOR) 40 MG tablet Take 40 mg by mouth daily.    . Melatonin 3 MG TABS Take 1 tablet by mouth at bedtime.    . meloxicam (MOBIC) 7.5 MG tablet Take 7.5 mg by mouth daily.  . metoprolol succinate (TOPROL-XL) 25 MG 24 hr tablet Take 25 mg by mouth at bedtime.   . pantoprazole (PROTONIX) 40 MG tablet Take 40 mg by mouth daily.  . polyethylene glycol (MIRALAX / GLYCOLAX) packet Take 17 g by mouth daily.  . potassium chloride (K-DUR) 10 MEQ tablet Take 4  tablets (40 mEq total) by mouth 3 (three) times daily.  . pramipexole (MIRAPEX) 0.25 MG tablet Take 1 tablet by mouth daily.  . sucralfate (CARAFATE) 1 G tablet Take 1 g by mouth 4 (four) times daily -  with meals and at bedtime.   Marland Kitchen tiotropium (SPIRIVA) 18 MCG inhalation capsule Place 18 mcg into inhaler and inhale as needed.    . torsemide (DEMADEX) 20 MG tablet Take 20 mg by mouth 2 (two) times daily. Take 20 mg 2 tablets twice a day.    Review of Systems  Constitutional: Positive for fatigue.  HENT: Negative.   Eyes:  Negative.   Respiratory: Negative.   Cardiovascular: Negative.   Gastrointestinal: Negative.   Endocrine: Negative.   Musculoskeletal: Positive for gait problem.  Skin: Negative.   Allergic/Immunologic: Negative.   Neurological: Positive for weakness.  Hematological: Negative.   Psychiatric/Behavioral: Negative.   All other systems reviewed and are negative.   BP 140/70  Pulse 58  Ht 5\' 2"  (1.575 m)  Wt 197 lb 4 oz (89.472 kg)  BMI 36.07 kg/m2  Physical Exam  Nursing note and vitals reviewed. Constitutional: She is oriented to person, place, and time. She appears well-developed and well-nourished.  HENT:  Head: Normocephalic.  Nose: Nose normal.  Mouth/Throat: Oropharynx is clear and moist.  Eyes: Conjunctivae are normal. Pupils are equal, round, and reactive to light.  Neck: Normal range of motion. Neck supple. No JVD present.  Cardiovascular: Normal rate, regular rhythm, S1 normal, S2 normal and intact distal pulses.  Exam reveals no gallop and no friction rub.   Murmur heard.  Systolic murmur is present with a grade of 1/6  Trace pitting edema to the thighs, abdominal bloating suggested by size of her abdomen  Pulmonary/Chest: Effort normal and breath sounds normal. No respiratory distress. She has no wheezes. She has no rales. She exhibits no tenderness.  Abdominal: Soft. Bowel sounds are normal. She exhibits no distension. There is no tenderness.  Musculoskeletal: Normal range of motion. She exhibits no edema and no tenderness.  Lymphadenopathy:    She has no cervical adenopathy.  Neurological: She is alert and oriented to person, place, and time. Coordination normal.  Skin: Skin is warm and dry. No rash noted. No erythema.  Psychiatric: She has a normal mood and affect. Her behavior is normal. Judgment and thought content normal.    Assessment and Plan

## 2013-08-14 NOTE — Assessment & Plan Note (Signed)
Recommended she stay on her lovastatin.

## 2013-08-14 NOTE — Assessment & Plan Note (Signed)
Blood pressure is well controlled on today's visit. No changes made to the medications apart from diuretic medication changes

## 2013-08-14 NOTE — Patient Instructions (Signed)
Please hold the torsemide for four days  Please take:  torsemide 20 mg for weight 197 - 199 pounds Take torsemide 40 mg for weight 200 - 202 Take torsemide  60 mg for weight 203 to 205 Take torsemide 80 mg for weight 206  Call the office for weight >208  (we would add 1/2 metolazone pill with torsemide)  Please call us if you have new issues that need to be addressed before your next appt.  Your physician wants you to follow-up in: 1 months.

## 2013-08-14 NOTE — Assessment & Plan Note (Signed)
Leg edema has resolved. Now likely mild to moderately dehydrated based on recent lab work.

## 2013-08-29 NOTE — Telephone Encounter (Signed)
This encounter was created in error - please disregard.

## 2013-09-05 ENCOUNTER — Ambulatory Visit: Payer: Self-pay | Admitting: Family Medicine

## 2013-09-18 ENCOUNTER — Ambulatory Visit (INDEPENDENT_AMBULATORY_CARE_PROVIDER_SITE_OTHER): Payer: Commercial Managed Care - HMO | Admitting: Cardiovascular Disease

## 2013-09-18 ENCOUNTER — Encounter: Payer: Self-pay | Admitting: Cardiovascular Disease

## 2013-09-18 VITALS — BP 120/62 | HR 58 | Ht 62.0 in | Wt 201.4 lb

## 2013-09-18 DIAGNOSIS — I5032 Chronic diastolic (congestive) heart failure: Secondary | ICD-10-CM

## 2013-09-18 DIAGNOSIS — I480 Paroxysmal atrial fibrillation: Secondary | ICD-10-CM

## 2013-09-18 DIAGNOSIS — R600 Localized edema: Secondary | ICD-10-CM

## 2013-09-18 DIAGNOSIS — C84A0 Cutaneous T-cell lymphoma, unspecified, unspecified site: Secondary | ICD-10-CM | POA: Insufficient documentation

## 2013-09-18 DIAGNOSIS — R609 Edema, unspecified: Secondary | ICD-10-CM

## 2013-09-18 DIAGNOSIS — I4891 Unspecified atrial fibrillation: Secondary | ICD-10-CM

## 2013-09-18 DIAGNOSIS — C8409 Mycosis fungoides, extranodal and solid organ sites: Secondary | ICD-10-CM

## 2013-09-18 DIAGNOSIS — I10 Essential (primary) hypertension: Secondary | ICD-10-CM

## 2013-09-18 DIAGNOSIS — E785 Hyperlipidemia, unspecified: Secondary | ICD-10-CM

## 2013-09-18 DIAGNOSIS — I509 Heart failure, unspecified: Secondary | ICD-10-CM

## 2013-09-18 NOTE — Assessment & Plan Note (Signed)
Blood pressure is well controlled on today's visit. No changes made to the medications. 

## 2013-09-18 NOTE — Assessment & Plan Note (Signed)
Weight is at the low end of her range. I'm concerned about dehydration. We have given her a lab slip for basic metabolic panel. She will try to do this with her dermatologist tomorrow at blood draw is performed. This will help guide her diuresis. Currently taking torsemide 40 mg twice a day

## 2013-09-18 NOTE — Assessment & Plan Note (Signed)
Edema has essentially resolved low we'll check a basic metabolic panel. She has been dehydrated before when weight is in the low 190 range

## 2013-09-18 NOTE — Progress Notes (Signed)
Patient ID: Hailey [name], female    DOB: 1932-10-02, 80 y.o.   MRN: [id number]  HPI Comments: Hailey [name] is a 80 y.o. female w/ PMHx s/f HOCM, h/o DVT (s/p IVC filter), h/o PUD/GIB, PAT, cerebral aneurysm, COPD, pulmonary HTN, HTN, HLD, OSA (intolerant to CPAP, on 2L via n/c), fibromyalgia and anxiety who was admitted to [facility] 2/25 to 04/11/13 for new onset atrial fibrillation w/ RVR. baseline exertional dyspnea- multifactorial due to deconditioning, COPD. Underwent pulmonary rehab in 2014, followed by Dr. Gwenette Greet. History of paroxysmal atrial fibrillation.She was not started on anticoagulation given a history of GI bleeding.  h/o profuse GIB in the past, required IVC filter for DVT in the past.   Admitted to the hospital February 2015, April 2015, again in June  In followup today, she reports that she is doing much better from a cardiac perspective. She does report a recent diagnosis of T-cell lymphoma and is being seen by dermatology. She reports having a biopsy. She has skin changes on her lower extremities, and in various places on her body. Her weight has been stable at in the low 192 range. Previously when her weight was this low, she had renal dysfunction. No recent blood work has been done. He continues to sleep in her recliner for comfort. Leg elevation seems to help her mild leg edema. Reports having a ventral hernia but with no symptoms  In June 2015 on a clinic visit she reported having severe shortness of breath, leg swelling, weight gain. Torsemide 80 mg was not working for her. Metolazone was added. She started on metolazone and after one dose, had rapid 10 pound weight loss per the patient. She then took only half pills and reported having minimal diuresis. She did present to the hospital she did not feel well. Since her discharge, she has had creatinine above her baseline. Lab work 08/06/2011 showing creatinine 1.4, elevated BUN. At this time she was on torsemide 80 mg daily. It was  decreased down to 60 mg daily Lab work 08/08/2013 showed creatinine 1.6, BUN 50 with potassium 3.0. Torsemide dosing decreased down to 40 mg daily  history of severe neck pain, radiating pain up to her head with headaches  EKG: sinus bradycardia with rate 58 beats per minute, nonspecific ST abnormality   Outpatient Encounter Prescriptions as of 09/18/2013  Medication Sig  . Acetaminophen (TYLENOL EXTRA STRENGTH PO) Take by mouth as needed.  Marland Kitchen amiodarone (PACERONE) 200 MG tablet TAKE 1 TABLET BY MOUTH EVERY 6 HOURS  . dimenhyDRINATE (DRAMAMINE) 50 MG tablet Take 50 mg by mouth every 8 (eight) hours as needed.    . DULoxetine (CYMBALTA) 60 MG capsule Take 90 mg by mouth daily.  . Fluticasone-Salmeterol (ADVAIR DISKUS) 100-50 MCG/DOSE AEPB Inhale 1 puff into the lungs every 12 (twelve) hours.  . gabapentin (NEURONTIN) 100 MG capsule Take 200 mg by mouth every evening. Once daily in evening  . gabapentin (NEURONTIN) 300 MG capsule Take 300 mg by mouth every morning. Once daily in am  . gabapentin (NEURONTIN) 400 MG capsule Take 400 mg by mouth at bedtime.  Marland Kitchen loratadine (CLARITIN) 10 MG tablet Take 10 mg by mouth daily.  Marland Kitchen lovastatin (MEVACOR) 40 MG tablet Take 40 mg by mouth daily.    . Melatonin 3 MG TABS Take 1 tablet by mouth at bedtime.    . meloxicam (MOBIC) 7.5 MG tablet Take 7.5 mg by mouth daily.  . metoprolol succinate (TOPROL-XL) 25 MG 24 hr tablet Take  25 mg by mouth at bedtime.   . pantoprazole (PROTONIX) 40 MG tablet Take 40 mg by mouth daily.  . polyethylene glycol (MIRALAX / GLYCOLAX) packet Take 17 g by mouth daily.  . potassium chloride (K-DUR) 10 MEQ tablet Take 4 tablets (40 mEq total) by mouth 3 (three) times daily.  . pramipexole (MIRAPEX) 0.25 MG tablet Take 1 tablet by mouth daily.  . sucralfate (CARAFATE) 1 G tablet Take 1 g by mouth 4 (four) times daily -  with meals and at bedtime.   Marland Kitchen tiotropium (SPIRIVA) 18 MCG inhalation capsule Place 18 mcg into inhaler and inhale  as needed.    . torsemide (DEMADEX) 20 MG tablet Take 20 mg by mouth 2 (two) times daily. Take 20 mg 2 tablets twice a day.  . triamcinolone ointment (KENALOG) 0.1 % Apply 1 application topically 2 (two) times daily.     Review of Systems  Constitutional: Positive for fatigue.  HENT: Negative.   Eyes: Negative.   Respiratory: Negative.   Cardiovascular: Negative.   Gastrointestinal: Negative.   Endocrine: Negative.   Musculoskeletal: Positive for gait problem.  Skin: Positive for rash.  Allergic/Immunologic: Negative.   Neurological: Positive for weakness.  Hematological: Negative.   Psychiatric/Behavioral: Negative.   All other systems reviewed and are negative.   BP 120/62  Pulse 58  Ht 5\' 2"  (1.575 m)  Wt 201 lb 7 oz (91.371 kg)  BMI 36.83 kg/m2  Physical Exam  Nursing note and vitals reviewed. Constitutional: She is oriented to person, place, and time. She appears well-developed and well-nourished.  HENT:  Head: Normocephalic.  Nose: Nose normal.  Mouth/Throat: Oropharynx is clear and moist.  Eyes: Conjunctivae are normal. Pupils are equal, round, and reactive to light.  Neck: Normal range of motion. Neck supple. No JVD present.  Cardiovascular: Normal rate, regular rhythm, S1 normal, S2 normal and intact distal pulses.  Exam reveals no gallop and no friction rub.   Murmur heard.  Systolic murmur is present with a grade of 1/6  No significant lower extremity edema  Pulmonary/Chest: Effort normal and breath sounds normal. No respiratory distress. She has no wheezes. She has no rales. She exhibits no tenderness.  Abdominal: Soft. Bowel sounds are normal. She exhibits no distension. There is no tenderness.  Musculoskeletal: Normal range of motion. She exhibits no edema and no tenderness.  Lymphadenopathy:    She has no cervical adenopathy.  Neurological: She is alert and oriented to person, place, and time. Coordination normal.  Skin: Skin is warm and dry. No rash  noted. No erythema.  Psychiatric: She has a normal mood and affect. Her behavior is normal. Judgment and thought content normal.    Assessment and Plan

## 2013-09-18 NOTE — Patient Instructions (Signed)
You are doing well. No medication changes were made.  Try to keep your weight above 192 to avoid dehydration  Please call us if you have new issues that need to be addressed before your next appt.  Your physician wants you to follow-up in: 3 months.  You will receive a reminder letter in the mail two months in advance. If you don't receive a letter, please call our office to schedule the follow-up appointment.

## 2013-09-18 NOTE — Assessment & Plan Note (Signed)
She seems to be maintaining normal sinus rhythm on her current medication regimen. No changes were made. She is very symptomatic from her atrial fibrillation   

## 2013-09-18 NOTE — Assessment & Plan Note (Signed)
She reports recent diagnosis of T-cell lymphoma. She is being evaluated by dermatology and also seeking a second opinion.

## 2013-09-18 NOTE — Assessment & Plan Note (Signed)
Suggested she stay on her lovastatin

## 2013-09-24 ENCOUNTER — Telehealth: Payer: Self-pay | Admitting: *Deleted

## 2013-09-24 NOTE — Telephone Encounter (Signed)
Please call patient with lab results

## 2013-09-25 NOTE — Telephone Encounter (Signed)
Pt was to have labs drawn at her dermatologist's office. Left message advising pt that we have not received results yet.  Asked her to call w/ name of doctor that drew labs so that I can call and check on their status.

## 2013-09-25 NOTE — Telephone Encounter (Signed)
Pt called back states that her dermatologist Arin Isenstein located [facility]. [phone] States she cannot get through to them to get results.

## 2013-09-25 NOTE — Telephone Encounter (Signed)
Left message at lab at [facility] to fax results to [place]. Asked her to call w/ any questions or concerns.

## 2013-09-26 DIAGNOSIS — R198 Other specified symptoms and signs involving the digestive system and abdomen: Secondary | ICD-10-CM | POA: Insufficient documentation

## 2013-09-26 NOTE — Telephone Encounter (Signed)
Left message for [facility] to send results to [place].

## 2013-09-30 NOTE — Telephone Encounter (Signed)
Left message pleading for someone at [facility] to call back or fax results to [place].

## 2013-10-01 ENCOUNTER — Ambulatory Visit: Payer: Self-pay | Admitting: Gastroenterology

## 2013-10-02 ENCOUNTER — Telehealth: Payer: Self-pay

## 2013-10-02 NOTE — Telephone Encounter (Signed)
Reviewed results w/ pt. Pt verbalizes frustration at inability to obtain these results from [facility].  She is appreciative of the call back.

## 2013-10-02 NOTE — Telephone Encounter (Signed)
Pt would like lab results. States she has tried to get these for the last 2 weeks.

## 2013-11-25 ENCOUNTER — Other Ambulatory Visit: Payer: Self-pay | Admitting: Nurse Practitioner

## 2013-12-02 ENCOUNTER — Emergency Department: Payer: Self-pay | Admitting: Emergency Medicine

## 2013-12-02 LAB — CBC WITH DIFFERENTIAL/PLATELET
Basophil #: 0 10*3/uL (ref 0.0–0.1)
Basophil %: 0.5 %
Eosinophil #: 0.1 10*3/uL (ref 0.0–0.7)
Eosinophil %: 2.3 %
HCT: 36.4 % (ref 35.0–47.0)
HGB: 12.2 g/dL (ref 12.0–16.0)
LYMPHS ABS: 1.3 10*3/uL (ref 1.0–3.6)
Lymphocyte %: 22.5 %
MCH: 30.7 pg (ref 26.0–34.0)
MCHC: 33.6 g/dL (ref 32.0–36.0)
MCV: 91 fL (ref 80–100)
MONOS PCT: 9.7 %
Monocyte #: 0.6 x10 3/mm (ref 0.2–0.9)
NEUTROS PCT: 65 %
Neutrophil #: 3.9 10*3/uL (ref 1.4–6.5)
PLATELETS: 219 10*3/uL (ref 150–440)
RBC: 3.98 10*6/uL (ref 3.80–5.20)
RDW: 13.9 % (ref 11.5–14.5)
WBC: 6 10*3/uL (ref 3.6–11.0)

## 2013-12-02 LAB — BASIC METABOLIC PANEL
Anion Gap: 10 (ref 7–16)
BUN: 35 mg/dL — AB (ref 7–18)
CHLORIDE: 95 mmol/L — AB (ref 98–107)
Calcium, Total: 9.4 mg/dL (ref 8.5–10.1)
Co2: 32 mmol/L (ref 21–32)
Creatinine: 1.51 mg/dL — ABNORMAL HIGH (ref 0.60–1.30)
EGFR (African American): 43 — ABNORMAL LOW
EGFR (Non-African Amer.): 35 — ABNORMAL LOW
GLUCOSE: 109 mg/dL — AB (ref 65–99)
Osmolality: 282 (ref 275–301)
Potassium: 3.3 mmol/L — ABNORMAL LOW (ref 3.5–5.1)
SODIUM: 137 mmol/L (ref 136–145)

## 2013-12-02 LAB — TROPONIN I

## 2013-12-02 LAB — URINALYSIS, COMPLETE
Bilirubin,UR: NEGATIVE
Blood: NEGATIVE
GLUCOSE, UR: NEGATIVE mg/dL (ref 0–75)
Hyaline Cast: 5
Ketone: NEGATIVE
Leukocyte Esterase: NEGATIVE
NITRITE: NEGATIVE
Ph: 5 (ref 4.5–8.0)
Protein: NEGATIVE
RBC,UR: 1 /HPF (ref 0–5)
Specific Gravity: 1.012 (ref 1.003–1.030)
Squamous Epithelial: NONE SEEN

## 2013-12-05 ENCOUNTER — Telehealth: Payer: Self-pay | Admitting: *Deleted

## 2013-12-05 NOTE — Telephone Encounter (Signed)
Lmom to call our office to verify mailing address (we received returned mail).

## 2013-12-24 ENCOUNTER — Ambulatory Visit: Payer: Commercial Managed Care - HMO | Admitting: Cardiovascular Disease

## 2014-01-14 ENCOUNTER — Inpatient Hospital Stay: Payer: Self-pay | Admitting: Internal Medicine

## 2014-01-14 LAB — URINALYSIS, COMPLETE
Bacteria: NONE SEEN
Bilirubin,UR: NEGATIVE
Glucose,UR: >=500 mg/dL (ref 0–75)
LEUKOCYTE ESTERASE: NEGATIVE
Nitrite: NEGATIVE
Ph: 6 (ref 4.5–8.0)
SPECIFIC GRAVITY: 1.014 (ref 1.003–1.030)
Squamous Epithelial: 2

## 2014-01-14 LAB — PHOSPHORUS: PHOSPHORUS: 1.6 mg/dL — AB (ref 2.5–4.9)

## 2014-01-14 LAB — COMPREHENSIVE METABOLIC PANEL
ALBUMIN: 3.1 g/dL — AB (ref 3.4–5.0)
ALK PHOS: 120 U/L — AB
ALT: 18 U/L
ANION GAP: 8 (ref 7–16)
AST: 27 U/L (ref 15–37)
BUN: 7 mg/dL (ref 7–18)
Bilirubin,Total: 0.5 mg/dL (ref 0.2–1.0)
CALCIUM: 8.7 mg/dL (ref 8.5–10.1)
CO2: 24 mmol/L (ref 21–32)
Chloride: 105 mmol/L (ref 98–107)
Creatinine: 0.83 mg/dL (ref 0.60–1.30)
EGFR (African American): >60
Glucose: 152 mg/dL — ABNORMAL HIGH (ref 65–99)
Osmolality: 275 (ref 275–301)
Potassium: 3 mmol/L — ABNORMAL LOW (ref 3.5–5.1)
Sodium: 137 mmol/L (ref 136–145)
Total Protein: 7.2 g/dL (ref 6.4–8.2)

## 2014-01-14 LAB — CBC
HCT: 34.1 % — ABNORMAL LOW (ref 35.0–47.0)
HGB: 11 g/dL — ABNORMAL LOW (ref 12.0–16.0)
MCH: 29.7 pg (ref 26.0–34.0)
MCHC: 32.3 g/dL (ref 32.0–36.0)
MCV: 92 fL (ref 80–100)
Platelet: 217 10*3/uL (ref 150–440)
RBC: 3.71 10*6/uL — AB (ref 3.80–5.20)
RDW: 14.4 % (ref 11.5–14.5)
WBC: 9.1 10*3/uL (ref 3.6–11.0)

## 2014-01-14 LAB — TROPONIN I

## 2014-01-14 LAB — INFLUENZA A,B,H1N1 - PCR (ARMC)
H1N1FLUPCR: NOT DETECTED
INFLAPCR: NEGATIVE
Influenza B By PCR: NEGATIVE

## 2014-01-14 LAB — PROTIME-INR
INR: 1
Prothrombin Time: 13.4 secs (ref 11.5–14.7)

## 2014-01-14 LAB — MAGNESIUM: MAGNESIUM: 2.1 mg/dL

## 2014-01-15 LAB — BASIC METABOLIC PANEL
Anion Gap: 9 (ref 7–16)
BUN: 6 mg/dL — ABNORMAL LOW (ref 7–18)
Calcium, Total: 8.8 mg/dL (ref 8.5–10.1)
Chloride: 105 mmol/L (ref 98–107)
Co2: 25 mmol/L (ref 21–32)
Creatinine: 0.76 mg/dL (ref 0.60–1.30)
EGFR (Non-African Amer.): >60
GLUCOSE: 118 mg/dL — AB (ref 65–99)
OSMOLALITY: 276 (ref 275–301)
POTASSIUM: 3.2 mmol/L — AB (ref 3.5–5.1)
Sodium: 139 mmol/L (ref 136–145)

## 2014-01-15 LAB — CBC WITH DIFFERENTIAL/PLATELET
BASOS PCT: 0.1 %
Basophil #: 0 10*3/uL (ref 0.0–0.1)
EOS PCT: 0.4 %
Eosinophil #: 0 10*3/uL (ref 0.0–0.7)
HCT: 29.5 % — ABNORMAL LOW (ref 35.0–47.0)
HGB: 9.6 g/dL — ABNORMAL LOW (ref 12.0–16.0)
Lymphocyte #: 1 10*3/uL (ref 1.0–3.6)
Lymphocyte %: 12.7 %
MCH: 30.2 pg (ref 26.0–34.0)
MCHC: 32.7 g/dL (ref 32.0–36.0)
MCV: 93 fL (ref 80–100)
MONO ABS: 1.2 x10 3/mm — AB (ref 0.2–0.9)
Monocyte %: 15.8 %
NEUTROS PCT: 71 %
Neutrophil #: 5.5 10*3/uL (ref 1.4–6.5)
Platelet: 200 10*3/uL (ref 150–440)
RBC: 3.19 10*6/uL — AB (ref 3.80–5.20)
RDW: 14.7 % — AB (ref 11.5–14.5)
WBC: 7.7 10*3/uL (ref 3.6–11.0)

## 2014-01-16 LAB — URINE CULTURE

## 2014-01-18 LAB — BASIC METABOLIC PANEL
Anion Gap: 5 — ABNORMAL LOW (ref 7–16)
BUN: 11 mg/dL (ref 7–18)
Calcium, Total: 9.1 mg/dL (ref 8.5–10.1)
Chloride: 108 mmol/L — ABNORMAL HIGH (ref 98–107)
Co2: 28 mmol/L (ref 21–32)
Creatinine: 0.8 mg/dL (ref 0.60–1.30)
Glucose: 132 mg/dL — ABNORMAL HIGH (ref 65–99)
Osmolality: 283 (ref 275–301)
Potassium: 4.1 mmol/L (ref 3.5–5.1)
Sodium: 141 mmol/L (ref 136–145)

## 2014-01-18 LAB — PHOSPHORUS: PHOSPHORUS: 3.6 mg/dL (ref 2.5–4.9)

## 2014-01-18 LAB — MAGNESIUM: MAGNESIUM: 2.3 mg/dL

## 2014-01-19 LAB — CULTURE, BLOOD (SINGLE)

## 2014-01-20 LAB — EXPECTORATED SPUTUM ASSESSMENT W GRAM STAIN, RFLX TO RESP C

## 2014-01-29 ENCOUNTER — Telehealth: Payer: Self-pay

## 2014-01-29 NOTE — Telephone Encounter (Signed)
Left message for Hailey [name] to call back.  

## 2014-01-29 NOTE — Telephone Encounter (Signed)
Nurse with [facility] called states this a.m. When she was there, upon exertion walking to the door, pt experienced CP, BP was 142/68 at rest. When pt stood up, pt became dizzy, BP dropped to 120/68. Within 30 seconds, went up to 166/90, and pt experienced the CP again. Then it all went away, she walked 57 feet with the physical therapist, after this her BP went down to 142/68, and HR was 68 at rest and increased to 76 with ambulation, and went back to 68.Now her BP is 132/72. Dr. Loney Hering ordered PT for conditioning, nurse would like pt seen before pt starts PT (today's PT was evaluation). Please call.

## 2014-01-31 NOTE — Telephone Encounter (Signed)
Spoke w/ pt.  She reports that she has had pneumonia and that she felt that her symptoms were to be expected until she got to feeling better.  She states that she does not feel that her sx are urgent and states that she will call us if she needs anything.  Asked pt to call next week if she is not feeling better.

## 2014-02-12 ENCOUNTER — Other Ambulatory Visit: Payer: Self-pay | Admitting: Cardiovascular Disease

## 2014-02-18 DIAGNOSIS — E860 Dehydration: Secondary | ICD-10-CM | POA: Diagnosis not present

## 2014-02-18 DIAGNOSIS — J209 Acute bronchitis, unspecified: Secondary | ICD-10-CM | POA: Diagnosis not present

## 2014-02-25 ENCOUNTER — Other Ambulatory Visit: Payer: Self-pay | Admitting: Cardiovascular Disease

## 2014-03-27 ENCOUNTER — Telehealth: Payer: Self-pay | Admitting: *Deleted

## 2014-03-27 NOTE — Telephone Encounter (Signed)
Spoke w/ pt.  Our conversation was initially interrupted due to an emergency situation in the office.  She reports BP has been running high for her, 140-150/90s and she feels dizzy when it is this high.  Reports that she had pneumonia a few weeks ago, is no longer on abx. She states that she feels that she is going in and out of afib, w/ HR in the 60s.  Offered pt appt today, but she declines. Pt sched to see Dr. Rockey Situ tomorrow at 1:45.

## 2014-03-27 NOTE — Telephone Encounter (Signed)
Pt is stating she needs an apt, but wants to know if it can wait or should she come in Pt is having chest pain off and on and Afib off and on and BP is staying high Pt states she knows when her BP is high for she is dizzy.  140/76 doesn't know  167/91 today  Past days (not sure of dates)  164/89  165/85 151/79 154/85

## 2014-03-28 ENCOUNTER — Ambulatory Visit (INDEPENDENT_AMBULATORY_CARE_PROVIDER_SITE_OTHER): Payer: Commercial Managed Care - HMO | Admitting: Cardiovascular Disease

## 2014-03-28 ENCOUNTER — Encounter: Payer: Self-pay | Admitting: Cardiovascular Disease

## 2014-03-28 VITALS — BP 112/58 | HR 59 | Ht 62.0 in | Wt 187.5 lb

## 2014-03-28 DIAGNOSIS — I5032 Chronic diastolic (congestive) heart failure: Secondary | ICD-10-CM

## 2014-03-28 DIAGNOSIS — I480 Paroxysmal atrial fibrillation: Secondary | ICD-10-CM

## 2014-03-28 DIAGNOSIS — I4891 Unspecified atrial fibrillation: Secondary | ICD-10-CM

## 2014-03-28 DIAGNOSIS — E785 Hyperlipidemia, unspecified: Secondary | ICD-10-CM | POA: Diagnosis not present

## 2014-03-28 DIAGNOSIS — I509 Heart failure, unspecified: Secondary | ICD-10-CM | POA: Diagnosis not present

## 2014-03-28 DIAGNOSIS — I10 Essential (primary) hypertension: Secondary | ICD-10-CM | POA: Diagnosis not present

## 2014-03-28 MED ORDER — LOSARTAN POTASSIUM 50 MG PO TABS: 50.0000 mg | ORAL_TABLET | Freq: Every day | ORAL | Status: DC

## 2014-03-28 NOTE — Patient Instructions (Signed)
You are doing well.  Please take losartan 1/2 pill once a day If blood pressure continues to run high after one week, Ok to increase to a full pill  We will check blood work today  Please call us if you have new issues that need to be addressed before your next appt.  Your physician wants you to follow-up in: 6 months.  You will receive a reminder letter in the mail two months in advance. If you don't receive a letter, please call our office to schedule the follow-up appointment.

## 2014-03-28 NOTE — Assessment & Plan Note (Signed)
Blood pressure has been borderline elevated. Recommended she start losartan 25 mg daily. If blood pressure continues to run high, would increase up to 50 g daily

## 2014-03-28 NOTE — Assessment & Plan Note (Signed)
Encouraged her to stay on her lovastatin.

## 2014-03-28 NOTE — Assessment & Plan Note (Signed)
She seems to be maintaining normal sinus rhythm on her current medication regimen. No changes were made. She is very symptomatic from her atrial fibrillation

## 2014-03-28 NOTE — Progress Notes (Signed)
Patient ID: Hailey [name], female    DOB: 09-30-1932, 81 y.o.   MRN: [id number]  HPI Comments: Hailey [name] is a 81 y.o. female w/ PMHx s/f HOCM, h/o DVT (s/p IVC filter), h/o PUD/GIB, PAT, cerebral aneurysm, COPD, pulmonary HTN, HTN, HLD, OSA (intolerant to CPAP, on 2L via n/c), fibromyalgia and anxiety who was admitted to [facility] 2/25 to 04/11/13 for new onset atrial fibrillation w/ RVR. baseline exertional dyspnea- multifactorial due to deconditioning, COPD. Underwent pulmonary rehab in 2014, followed by Dr. Gwenette Greet. History of paroxysmal atrial fibrillation.She was not started on anticoagulation given a history of GI bleeding.  h/o profuse GIB in the past, required IVC filter for DVT in the past.  Admitted to the hospital February 2015, April 2015, again in June She has a diagnosis of T-cell lymphoma She presents today for routine follow-up of her atrial fibrillation  She reports that in 12/02/2013 she had a viral infection, lab work showing severe dehydration, low potassium. She soon after developed pneumonia, had dramatic weight loss, again treated for bronchitis January 2016. She has had 14 pound weight loss Previous weight 201 pounds, she got down to 179 pounds, now 187.8 pounds She denies having any significant lower extremity edema. She is taking torsemide 40 mg in the morning, 20 mg at noon. Blood pressure 166 up to 060 systolic at home.  EKG shows normal sinus rhythm with rate 59 bpm, consider old inferior him infarct, nonspecific ST abnormality  Other past medical history  diagnosis of T-cell lymphoma, seen by dermatology.  She has skin changes on her lower extremities, and in various places on her body. Reports having a ventral hernia but with no symptoms  In June 2015 on a clinic visit she reported having severe shortness of breath, leg swelling, weight gain. Torsemide 80 mg was not working for her. Metolazone was added. She started on metolazone and after one dose, had rapid 10 pound  weight loss per the patient. She then took only half pills and reported having minimal diuresis. She did present to the hospital she did not feel well. Since her discharge, she has had creatinine above her baseline. Lab work 08/06/2011 showing creatinine 1.4, elevated BUN. At this time she was on torsemide 80 mg daily. It was decreased down to 60 mg daily Lab work 08/08/2013 showed creatinine 1.6, BUN 50 with potassium 3.0. Torsemide dosing decreased down to 40 mg daily  history of severe neck pain, radiating pain up to her head with headaches   Allergies  Allergen Reactions  . Aspirin     Other reaction(s): Bleeding  . Codeine     REACTION: causes nausea  . Compazine Other (See Comments)    ? Confusion per patient  . Ketorolac Tromethamine     toradol  . Macrodantin [Nitrofurantoin Macrocrystal]     Rash   . Nitrofurantoin     REACTION: causes rash  . Nsaids   . Phenazopyridine Hcl     REACTION: causes vision problems pyridum   . Pyridium Plus [Phenazopyridine-Butabarb-Hyosc]     Outpatient Encounter Prescriptions as of 03/28/2014  Medication Sig  . Acetaminophen (TYLENOL EXTRA STRENGTH PO) Take by mouth as needed.  Marland Kitchen amiodarone (PACERONE) 200 MG tablet Take 200 mg by mouth daily. Take an additional tablet if you have breakthrough arrhythmias.  . dimenhyDRINATE (DRAMAMINE) 50 MG tablet Take 50 mg by mouth every 8 (eight) hours as needed.    . DULoxetine (CYMBALTA) 60 MG capsule Take 90 mg by mouth  daily.  . Fluticasone-Salmeterol (ADVAIR DISKUS) 100-50 MCG/DOSE AEPB Inhale 1 puff into the lungs every 12 (twelve) hours.  . gabapentin (NEURONTIN) 100 MG capsule Take 200 mg by mouth every evening. Once daily in evening  . gabapentin (NEURONTIN) 300 MG capsule Take 300 mg by mouth every morning. Once daily in am  . gabapentin (NEURONTIN) 400 MG capsule Take 400 mg by mouth at bedtime.  Marland Kitchen loratadine (CLARITIN) 10 MG tablet Take 10 mg by mouth daily.  Marland Kitchen lovastatin (MEVACOR) 40 MG  tablet Take 40 mg by mouth daily.    . Melatonin 3 MG TABS Take 1 tablet by mouth at bedtime.    . meloxicam (MOBIC) 7.5 MG tablet Take 7.5 mg by mouth daily.  . metoprolol succinate (TOPROL-XL) 25 MG 24 hr tablet Take 25 mg by mouth at bedtime.   . NON FORMULARY Oxygen 1 liter.  . pantoprazole (PROTONIX) 40 MG tablet Take 40 mg by mouth daily.  . polyethylene glycol (MIRALAX / GLYCOLAX) packet Take 17 g by mouth daily.  . potassium chloride SA (K-DUR,KLOR-CON) 20 MEQ tablet Take 20 mEq by mouth 2 (two) times daily.  . pramipexole (MIRAPEX) 0.25 MG tablet Take 1 tablet by mouth daily.  . sucralfate (CARAFATE) 1 G tablet Take 1 g by mouth 4 (four) times daily -  with meals and at bedtime.   Marland Kitchen tiotropium (SPIRIVA) 18 MCG inhalation capsule Place 18 mcg into inhaler and inhale as needed.    . torsemide (DEMADEX) 20 MG tablet Take 20 mg by mouth 2 (two) times daily. Take 20 mg 2 tablets twice a day.  . torsemide (DEMADEX) 20 MG tablet TAKE 3 TABLETS DAILY. TAKE 1 ADDITIONAL TABLET IF YOU GAIN 2LBS IN 1 NIGHT FOR A TOTAL 80MG  THAT DAY  . triamcinolone ointment (KENALOG) 0.1 % Apply 1 application topically 2 (two) times daily.  Marland Kitchen losartan (COZAAR) 50 MG tablet Take 1 tablet (50 mg total) by mouth daily.  . [DISCONTINUED] amiodarone (PACERONE) 200 MG tablet TAKE 1 TABLET BY MOUTH EVERY 6 HOURS (Patient not taking: Reported on 03/28/2014)  . [DISCONTINUED] potassium chloride (K-DUR) 10 MEQ tablet Take 4 tablets (40 mEq total) by mouth 3 (three) times daily. (Patient not taking: Reported on 03/28/2014)  . [DISCONTINUED] torsemide (DEMADEX) 20 MG tablet TAKE 3 TABLETS EVERY DAY TAKE 1 ADDITIONAL TABLET IF YOU GAIN 2LBS IN1 NIGHT FOR A TOTAL 80MG  THAT DAY (Patient not taking: Reported on 03/28/2014)    Past Medical History  Diagnosis Date  . DVT (deep vein thrombosis) in pregnancy 09/2008    a. LLE, recurrent hx, has IVC filter. Not on coumadin now with history of GI bleeding  . Hyperlipidemia   . GERD  (gastroesophageal reflux disease)   . Hypertrophic cardiomyopathy     a. Echo 4/09 with EF 70-75%, asymmetricy basal septal hypertrophy, mild MR without systolic anterior motion of the mitral valve, LVOT gradient to 130 mmHg with Valsalva b. Echo 7/13: EF 60-65%, mild focal basal septal hypertrophy, no significant LVOT gradient, no MV SAM c. Echo 2/15: EF 55-60%, HOCM, resting LVOT gradient 29 mmHg, Valsalva LVOT gradient > 140 mmHg, mild LVH, mild TR, elevated PASP  . Anemia     Chronic  . Osteoarthritis     Hx of left TKR  . Fibromyalgia   . Cerebral aneurysm     Hx of  . Migraine headache     Hx of  . Hypertension   . COPD (chronic obstructive pulmonary disease)   .  PUD (peptic ulcer disease)     With GI bleeding  . History of colonoscopy   . PAF (paroxysmal atrial fibrillation)     a. Full-dose ASA alone 2/2 h/o GIB.  Marland Kitchen OSA (obstructive sleep apnea)     a. Intolerant to CPAP, wears 2L via n/c  . Fibromyalgia   . Anxiety   . PAT (paroxysmal atrial tachycardia)   . 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 Pneumonia     Past Surgical History  Procedure Laterality Date  . Total knee arthroplasty      Left  . Bladder suspension    . Foot surgery      Bilateral  . Inguinal hernia repair      Right  . Cerebral aneurysm repair    . Abdominal hysterectomy    . Appendectomy    . Tonsillectomy and adenoidectomy    . Shoulder surgery      Right  . Knee arthroscopy      Right  . Cardiac catheterization  2009    No significant CAD  . Cardiovascular stress test      a. Lexiscan Myoview 3/14: EF 76%, no evidence of ischemia or WMAs    Social History  reports that she quit smoking about 5 years ago. Her smoking use included Cigarettes. She has a 60 pack-year smoking history. She does not have any smokeless tobacco history on file. She reports that she drinks alcohol. She reports that she does not use illicit drugs.  Family History family history  includes Colon cancer in her brother and father; Colon polyps in her father and other; Heart disease in her father and sister; Pancreatic cancer in her mother.   Review of Systems  Constitutional: Positive for fatigue.  HENT: Negative.   Eyes: Negative.   Respiratory: Negative.   Cardiovascular: Negative.   Gastrointestinal: Negative.   Endocrine: Negative.   Musculoskeletal: Positive for gait problem.  Skin: Positive for rash.  Allergic/Immunologic: Negative.   Neurological: Positive for weakness.  Hematological: Negative.   Psychiatric/Behavioral: Negative.   All other systems reviewed and are negative.   BP 112/58 mmHg  Pulse 59  Ht 5\' 2"  (1.575 m)  Wt 187 lb 8 oz (85.049 kg)  BMI 34.29 kg/m2  Physical Exam  Constitutional: She is oriented to person, place, and time. She appears well-developed and well-nourished.  HENT:  Head: Normocephalic.  Nose: Nose normal.  Mouth/Throat: Oropharynx is clear and moist.  Eyes: Conjunctivae are normal. Pupils are equal, round, and reactive to light.  Neck: Normal range of motion. Neck supple. No JVD present.  Cardiovascular: Normal rate, regular rhythm, S1 normal, S2 normal and intact distal pulses.  Exam reveals no gallop and no friction rub.   Murmur heard.  Systolic murmur is present with a grade of 1/6  No significant lower extremity edema  Pulmonary/Chest: Effort normal and breath sounds normal. No respiratory distress. She has no wheezes. She has no rales. She exhibits no tenderness.  Abdominal: Soft. Bowel sounds are normal. She exhibits no distension. There is no tenderness.  Musculoskeletal: Normal range of motion. She exhibits no edema or tenderness.  Lymphadenopathy:    She has no cervical adenopathy.  Neurological: She is alert and oriented to person, place, and time. Coordination normal.  Skin: Skin is warm and dry. No rash noted. No erythema.  Psychiatric: She has a normal mood and affect. Her behavior is normal.  Judgment and thought content normal.    Assessment  and Plan  Nursing note and vitals reviewed.

## 2014-03-28 NOTE — Assessment & Plan Note (Signed)
She appears relatively euvolemic on today's visit. Recent weight loss likely from not eating in the setting of pneumonia, bronchitis. She'll continue on her current torsemide 40 mg in the morning, 20 mg at noon

## 2014-03-29 LAB — BASIC METABOLIC PANEL
BUN/Creatinine Ratio: 20 (ref 11–26)
BUN: 20 mg/dL (ref 8–27)
CO2: 24 mmol/L (ref 18–29)
Calcium: 10 mg/dL (ref 8.7–10.3)
Chloride: 99 mmol/L (ref 97–108)
Creatinine, Ser: 1 mg/dL (ref 0.57–1.00)
GFR calc Af Amer: 62 mL/min/{1.73_m2} (ref >59)
GFR calc non Af Amer: 54 mL/min/{1.73_m2} — ABNORMAL LOW (ref >59)
Glucose: 132 mg/dL — ABNORMAL HIGH (ref 65–99)
Potassium: 3.4 mmol/L — ABNORMAL LOW (ref 3.5–5.2)
Sodium: 144 mmol/L (ref 134–144)

## 2014-04-01 ENCOUNTER — Telehealth: Payer: Self-pay | Admitting: Cardiovascular Disease

## 2014-04-01 NOTE — Telephone Encounter (Signed)
Patient's daughter called to discuss lab results. Please call Patient daughter.

## 2014-04-01 NOTE — Telephone Encounter (Signed)
In Dr. Donivan Scull basket for review.

## 2014-04-02 ENCOUNTER — Telehealth: Payer: Self-pay | Admitting: Cardiovascular Disease

## 2014-04-02 NOTE — Telephone Encounter (Signed)
Daughter called back again to check on lab results.

## 2014-04-04 NOTE — Telephone Encounter (Signed)
Basic metabolic panel results Essentially normal Kidney function back to normal baseline Potassium at low end of normal Could add additional potassium 3 days per week  Labs can be viewed through my chart, would recommend  They Signup so they can see their labs

## 2014-04-04 NOTE — Telephone Encounter (Signed)
Reviewed results w/ pt's daughter. She verbalizes understanding and will call back w/ any questions or concerns.  Daughter states that she is concerned that pt is "seeking too much care" and "I've never seen anybody want to be sick so bad". She states that she may "find someone to schedule a weekly visit with to keep her happy."

## 2014-04-04 NOTE — Telephone Encounter (Signed)
Pt calling again about blood work for mother, states it has has been waiting over a week for this Stated to patient we are in clinics and they are doing this in between patient. But we will call as soon as Doctor is able to review them  She was kind of upset but sounded calm towards end of conversation.

## 2014-04-08 ENCOUNTER — Other Ambulatory Visit: Payer: Self-pay | Admitting: Cardiovascular Disease

## 2014-04-22 ENCOUNTER — Telehealth: Payer: Self-pay | Admitting: Cardiovascular Disease

## 2014-04-22 NOTE — Telephone Encounter (Signed)
Pt c/o of Chest Pain: STAT if CP now or developed within 24 hours  1. Are you having CP right now? Yes, tightness pressure up neck from center of chest   2. Are you experiencing any other symptoms (ex. SOB, nausea, vomiting, sweating)?  SOB feels like may pass out really tired when walking   3. How long have you been experiencing CP? Gotten worse since last Friday   4. Is your CP continuous or coming and going? Comes and go's this current episode has been continuous atleast the last hour (since lunch)   5. Have you taken Nitroglycerin? No, cannot take  ?

## 2014-04-22 NOTE — Telephone Encounter (Signed)
Spoke w/ pt.  She reports a chest pain and tightness "that just won't let up". Reports SOB and nausea. Reports that she has chest pain off and on, but this is not like before.  Advised pt to hang the phone up and call 911. She states that she does not want to do this.  Advised her that EMS can come out, do an EKG and assess her and take her to the ED if needed.  She is agreeable to this and states that she will hang the phone up and call 911.

## 2014-05-01 DIAGNOSIS — R251 Tremor, unspecified: Secondary | ICD-10-CM | POA: Diagnosis not present

## 2014-05-01 DIAGNOSIS — N390 Urinary tract infection, site not specified: Secondary | ICD-10-CM | POA: Diagnosis not present

## 2014-05-01 DIAGNOSIS — R399 Unspecified symptoms and signs involving the genitourinary system: Secondary | ICD-10-CM | POA: Diagnosis not present

## 2014-05-08 ENCOUNTER — Other Ambulatory Visit: Payer: Self-pay | Admitting: *Deleted

## 2014-05-08 MED ORDER — POTASSIUM CHLORIDE CRYS ER 20 MEQ PO TBCR: 20.0000 meq | EXTENDED_RELEASE_TABLET | Freq: Two times a day (BID) | ORAL | Status: DC

## 2014-05-09 DIAGNOSIS — R300 Dysuria: Secondary | ICD-10-CM | POA: Diagnosis not present

## 2014-05-09 DIAGNOSIS — N390 Urinary tract infection, site not specified: Secondary | ICD-10-CM | POA: Diagnosis not present

## 2014-05-14 ENCOUNTER — Other Ambulatory Visit: Payer: Self-pay | Admitting: *Deleted

## 2014-05-14 MED ORDER — AMIODARONE HCL 200 MG PO TABS: 200.0000 mg | ORAL_TABLET | Freq: Every day | ORAL | Status: DC

## 2014-05-19 DIAGNOSIS — N390 Urinary tract infection, site not specified: Secondary | ICD-10-CM | POA: Diagnosis not present

## 2014-05-19 DIAGNOSIS — R339 Retention of urine, unspecified: Secondary | ICD-10-CM | POA: Diagnosis not present

## 2014-05-19 DIAGNOSIS — E669 Obesity, unspecified: Secondary | ICD-10-CM | POA: Diagnosis not present

## 2014-05-19 DIAGNOSIS — N952 Postmenopausal atrophic vaginitis: Secondary | ICD-10-CM | POA: Diagnosis not present

## 2014-06-07 NOTE — H&P (Signed)
PATIENT NAME:  Hailey [name], Hailey [name] MR#:  [id number] DATE OF BIRTH:  Sep 23, 1932  DATE OF ADMISSION:  05/16/2013  PRIMARY CARE PHYSICIAN: Dr.  Baldemar Lenis.  REFERRING PHYSICIAN: Dr. Joni Fears   CHIEF COMPLAINT: Chest pain.   HISTORY OF PRESENT ILLNESS: The patient is a 81 year old female with past medical history of hypertrophic obstructive cardiomyopathy, hyperlipidemia, hypertension, osteoarthritis with arthritic changes, history of deep venous thrombosis and pulmonary embolism status post IVC filter not on any anticoagulation because of gastrointestinal bleed from bleeding ulcers,  recently admitted to the hospital in February 2015 with complaint of chest pain. At that time she was diagnosed with new-onset atrial fibrillation and was not placed on any anticoagulation as her CHADS score was just 2, given history of bleeding ulcers, is presenting to the ER with a chief complaint of chest pain. The patient is reporting that her chest pain started at 12:30 a.m. last night. It is midsternal, tight in nature, radiating to the neck, also  with some diaphoresis and shortness of breath. Denies any nausea, vomiting, dizziness or loss of consciousness. Initially, she thought it was some indigestion and took some antacids and metoprolol without significant relief. Eventually the patient came into the ER, nitroglycerin, aspirin was given following which she started feeling better. During my examination her chest pain and midsternal chest tightness is significantly improved.  Denies any abdominal pain. Denies any heart attacks in the past.   PAST MEDICAL HISTORY: Hypertrophic obstructive cardiomyopathy, hypertension, hyperlipidemia, and osteoarthritis, fibromyalgia, history of deep vein thrombosis and PE status post IVC filter, history of bleeding ulcers, status post EGD. The patient has recent diagnoses of atrial fibrillation in February 2015, not on any Coumadin.   PAST SURGICAL HISTORY: Hysterectomy, hernia repair, total knee  replacement on the left side, history of brain aneurysm status post clipping, hernia repair.   ALLERGIES: CODEINE, COMPAZINE, MACRODANTIN, NSAIDS, PYRIDIUM.   HOME MEDICATIONS: Tylenol 500 mg q.6 hours p.r.n., torsemide 20 mg p.o. 2 times a day, Spiriva 18 mcg 1 puff inhalation once daily, Protonix 40 mg once daily, potassium chloride 10 mEq p.o. 2 times a day, polyethylene glycol orally p.r.n., lovastatin  40 mg p.o. at bedtime,  Cymbalta 90 mg once a day, Claritin 10 mg once daily, Carafate 1 tablet p.o. 4 times a day prior to the meal,  aspirin 325 mg once daily, Advair 100 mcg/50, 1 puff inhalation 2 times a day.   PSYCHOSOCIAL HISTORY: Lives with family and quit smoking in the year 2010, but she still has 40 pack-year history of smoking. Denies any alcohol.   FAMILY HISTORY: Pancreatic cancer, diabetes mellitus, coronary artery disease and colon cancer runs in her family.   REVIEW OF SYSTEMS:  CONSTITUTIONAL: Denies any fever or fatigue.  EYES: Denies blurry vision, double vision, glaucoma.  ENT: Denies any tinnitus, ear pain, hearing loss. Denies any discharge . RESPIRATION: Denies cough, has chronic history of COPD.  CARDIOVASCULAR: Comes in with chest pain and shortness of breath. Denies any palpitations.  GASTROINTESTINAL: Denies nausea, vomiting, diarrhea, abdominal pain. Has a history of bleeding ulcers in the past.  GENITOURINARY: No dysuria or hematuria.  ENDOCRINE: Denies polyuria, nocturia, thyroid problems.  HEMATOLOGIC AND LYMPHATIC: No anemia, easy bruising, bleeding.  GENITOURINARY AND BREAST:  Denies breast mass or vaginal discharge.  INTEGUMENT: No acne, rash, lesions.  MUSCULOSKELETAL: No joint pain in the neck and back. Denies any shoulder pain.  NEUROLOGIC: Denies vertigo, ataxia.  PSYCHIATRIC: No ADD, OCD.   PHYSICAL EXAMINATION: VITAL SIGNS: Temperature 98.1, pulse  50, respirations 18, blood pressure 114/50 and pulse is 93%.  GENERAL APPEARANCE: Not in acute  distress. Moderately built and moderately nourished.  HEENT: Normocephalic, atraumatic. Pupils are equally reacting to light and accommodation. No scleral icterus. No conjunctival injection. No sinus tenderness. No postnasal drip.  NECK: Supple. No JVD. No thyromegaly.  LUNGS: Clear to auscultation bilaterally. No accessory muscle use and no anterior chest wall tenderness on palpation.  CARDIOVASCULAR: Irregularly irregular. No murmurs.  GASTROINTESTINAL: Soft. Bowel sounds are positive in all four quadrants. Nontender, nondistended. No hepatosplenomegaly. No masses felt.  NEUROLOGIC: Awake, alert, oriented x 3. Cranial nerves II through XII are grossly intact. Motor and sensory are intact. Reflexes are 2+.  EXTREMITIES: No edema No cyanosis. No clubbing.  SKIN: Warm to touch. Normal turgor. No rashes. No lesions.  MUSCULOSKELETAL: No joint effusion, tenderness, erythema.  PSYCHIATRIC: Normal mood and affect.   LABORATORY AND IMAGING STUDIES: Troponin less than 0.02. CBC is normal. PT-INR in the normal range. Urinalysis yellow in color, clear in appearance, nitrites negative, leukocyte esterase trace, Chem-8: Glucose 214, chloride 108. Rest of the Chem-8 is normal.   Chest x-ray, portable, single view, mild bibasilar airspace opacities likely reflecting atelectasis. Lungs otherwise clear. A 12-lead EKG has revealed sinus rhythm with a normal PR and QRS interval. No acute ST-T wave changes.   ASSESSMENT AND PLAN: A 81 year old female presenting to the ER with a chief complaint of chest tightness started at 12:30 a.m. last night, associated with some diaphoresis. Will be admitted with following assessment and plan:  1. Chest pain rule out acute myocardial infarction. Admit her to telemetry, acute coronary syndrome protocol with oxygen, nitroglycerin, aspirin, beta blocker and statin.  2. Cycle cardiac biomarkers.  3. Cardiology consult is placed to Dr. Rockey Situ.  4. History of hypertrophic  obstructive cardiomyopathy and hypertension. Continue her home medication.  5. Hyperlipidemia. Continue statin.  6. History of diabetes mellitus. The patient will be on sliding scale insulin.   According to the old medical records her CODE STATUS is DO NOT RESUSCITATE. We will continue the same. Plan of care was discussed with the patient. She is aware of the plan. Total time spent is 45 minutes.   ____________________________ Nicholes Mango, MD ag:sg D: 05/16/2013 07:53:32 ET T: 05/16/2013 08:43:54 ET JOB#: [id number]  cc: Nicholes Mango, MD, <Dictator> Derinda Late, MD  Nicholes Mango MD ELECTRONICALLY SIGNED 05/21/2013 7:37

## 2014-06-07 NOTE — Discharge Summary (Signed)
PATIENT NAME:  Hailey [name], Hailey [name] MR#:  [id number] DATE OF BIRTH:  1932-08-04  PRIMARY CARE PHYSICIAN:  Derinda Late, MD  DISCHARGE DIAGNOSES: 1.  Acute on chronic respiratory failure with hypoxia.  2.  Chronic obstructive pulmonary disease exacerbation.  3.  Pneumonia.  4.  History of hypertrophic cardiomyopathy.  5.  Atrial fibrillation. 6.  Gastrointestinal bleeding.   CONDITION: Stable.   CODE STATUS: Full code.   HOME MEDICATIONS: Please refer to the medication reconciliation list.   The patient needs home health with RN. Needs home oxygen 2 liters by nasal cannula.   ACTIVITY: As tolerated.   FOLLOWUP CARE: With PCP within 1-2 weeks.   REASON FOR ADMISSION:  Shortness of breath, cough, and weakness.   HOSPITAL COURSE: The patient is a 81 year old Caucasian female with a history of COPD and hypertrophic cardiomyopathy, who presented in the ED with  shortness of breath, cough, and weakness. The patient was noted to have chest x-ray findings suggestive of pneumonia. She was noted to be in acute hypoxic respiratory failure secondary to pneumonia. For detailed history and physical examination, please refer to the admission note dictated by Dr. Verdell Carmine on admission date.   LABORATORY AND RADIOLOGICAL DATA: The patient's ABG showed pH of 7.5, pCO2 of 66, pO2 of 56. Chest x-ray showed focal small area of patchy infiltrate in the right lung base. The patient's BMP showed glucose 162, BUN 7, creatinine 0.8, sodium 137, potassium 3, chloride 105, bicarbonate 24, troponin less than 0.02. WBC 9.1, hemoglobin 11, lactic acid 3.1.   Acute hypoxic respiratory failure on chronic respiratory failure, likely secondary to pneumonia.  After admission, the patient had been treated with  Levaquin.  Sputum cultures showed Haemophilus influenzae. In addition, the patient has been treated with nebulizer, Spiriva,  and Solu-Medrol, which was changed to prednisone p.o. yesterday. The patient's symptoms have much  improved. Her vital signs are stable. Physical examination is unremarkable  She is clinically stable.  Will be discharged to home today. According to physical therapy evaluation, the patient needs home health. I discussed the patient's condition and discharge plan with the patient and patient's daughter, and nurse.   TIME SPENT: About 42 minutes.    ____________________________ Demetrios Loll, MD qc:LT D: 01/19/2014 12:13:39 ET T: 01/19/2014 18:24:08 ET JOB#: [id number]  cc: Demetrios Loll, MD, <Dictator> Demetrios Loll MD ELECTRONICALLY SIGNED 01/20/2014 17:08

## 2014-06-07 NOTE — H&P (Signed)
PATIENT NAME:  Hailey [name], Hailey [name] MR#:  [id number] DATE OF BIRTH:  Jul 03, 1932  DATE OF ADMISSION:  01/14/2014  PRIMARY CARE PHYSICIAN: Dr. Baldemar Lenis  CHIEF COMPLAINT: Shortness of breath, cough, and weakness.   HISTORY OF PRESENT ILLNESS: This is a 81 year old female who presents to the hospital due to a week history of shortness of breath, weakness and cough. The patient went to see her primary care physician, Dr. Baldemar Lenis, who then referred her to the ER for further evaluation. In the Emergency Room, the patient was noted to have chest x-ray findings suggestive of pneumonia. She was noted to be in acute hypoxic respiratory failure secondary to it and hospitalist services were contacted for further treatment and evaluation. The patient admits to a cough productive with yellow, green sputum. Positive fever at home. Yesterday was 102. Positive nausea but no vomiting. No sick contacts. Positive chest tightness due to the cough and shortness of breath. No paroxysmal nocturnal dyspnea. No orthopnea. No palpitations. No syncope. No abdominal pain. No other associated symptoms presently.   REVIEW OF SYSTEMS:  CONSTITUTIONAL: Positive documented fever of 102. No weight gain, no weight loss.  EYES: No blurred or double vision.  ENT: No tinnitus. No postnasal drip. No redness of the oropharynx.  RESPIRATORY: Positive cough. No wheeze. No hemoptysis. Positive dyspnea.  CARDIOVASCULAR: No chest pain. No orthopnea. No palpitations. No syncope.  GASTROINTESTINAL: Positive nausea. No vomiting, diarrhea, abdominal pain. No melena or hematochezia.  GENITOURINARY: No dysuria or hematuria.  ENDOCRINE: No polyuria or nocturia. No heat or cold intolerance. HEMATOLOGIC: No anemia, no bruising, no bleeding.  INTEGUMENTARY: No rashes or lesions.  MUSCULOSKELETAL: No arthritis. No swelling. No gout.  NEUROLOGIC: No numbness, tingling or ataxia. No seizure type activity.  PSYCHIATRIC: No anxiety. No insomnia. No ADD. Positive  depression.   PAST MEDICAL HISTORY: Consistent with COPD, restless leg syndrome, hypertrophic cardiomyopathy, neuropathy, hyperlipidemia, history of GI bleed.   ALLERGIES: ASPIRIN, CODEINE, COMPAZINE, MACRODANTIN, NSAIDS, PYRIDIUM AND TORADOL.   SOCIAL HISTORY: Used to be a smoker. Does have a 40 pack-year smoking history, quit about a few years back. No alcohol abuse. No illicit drug abuse. Lives by herself.   FAMILY HISTORY: Mother and father both deceased. Mother died from pancreatic cancer. Father had colon cancer, also had heart problems.   CURRENT MEDICATIONS: Advair 100/50 one puff b.i.d., amiodarone 200 mg q. 6 hours, aspirin 325 mg daily, baclofen 10 mg at bedtime as needed, Celexa 5 mg at bedtime, Claritin 10 mg daily, Dramamine 50 mg q. 6 hours as needed, Cymbalta 60 mg daily, gabapentin 200 mg in the evening, 300 mg in the morning and 400 mg at bedtime, potassium 10 mEq b.i.d., lovastatin 40 mg daily, melatonin 3 mg at bedtime, meloxicam 7.5 mg daily, Toprol 25 mg daily, MiraLax daily as needed, Protonix 40 mg daily, pramipexole 0.25 mg at bedtime, albuterol inhaler 2 puffs q. 6 hours as needed, Spiriva 1 puff daily, sucralfate 1 gram q.i.d., torsemide 40 mg b.i.d., triamcinolone topical ointment to be applied to the groin, vulva and legs 3 times daily, Tylenol 500 mg q. 6 hours as needed.   PHYSICAL EXAMINATION: Presently is as follows:  VITAL SIGNS: Temperature 99.6, pulse 75, respirations 20, blood pressure 135/62.  Sats 95% on 4 liters nasal cannula.  GENERAL: She is a lethargic-appearing female but in mild respiratory distress.  HEAD, EYES, EARS, NOSE AND THROAT: Atraumatic, normocephalic. Extraocular muscles are intact. Pupils are equal and reactive to light. Sclerae anicteric. No conjunctival injection. No  pharyngeal erythema.  NECK: Supple. There is no jugular venous distention. No bruits, no lymphadenopathy, no thyromegaly.  HEART: Regular. No murmurs, no rubs, no clicks.   LUNGS: She has coarse rhonchi diffusely. Positive use of accessory muscles. No dullness to percussion.  ABDOMEN: Soft, flat, nontender, nondistended. Has good bowel sounds. No hepatosplenomegaly appreciated.  EXTREMITIES: No evidence of any cyanosis, clubbing, or peripheral edema. Has +2 pedal and radial pulses bilaterally.  NEUROLOGICAL: The patient is alert, awake and oriented x3 with no focal motor or sensory deficits appreciated bilaterally.  SKIN: Moist and warm with no rashes appreciated.  LYMPHATIC: There is no cervical or axillary lymphadenopathy.   DIAGNOSTIC DATA: Serum glucose 152, BUN 7, creatinine 0.8, sodium 137, potassium 3, chloride 105, bicarb 24. LFTs are within normal limits. Troponin less than 0.02. White cell count 9.1, hemoglobin 11, hematocrit 34.1, platelet count 217,000. Lactic acid 3.1.  ABG showed a pH of 7.5, pCO2 33, pO2 56. The patient also had a chest x-ray done which showed a focal small area of patchy infiltrate in the right lung base and bronchitic changes.   ASSESSMENT AND PLAN: This is a 81 year old female with history of chronic obstructive pulmonary disease, restless leg syndrome, hypertrophic cardiomyopathy, neuropathy, hyperlipidemia, history of previous gastrointestinal bleed who presents to the hospital with shortness of breath, cough, and weakness and noted to have a pneumonia.  1.  Acute hypoxic respiratory failure. I suspect this is secondary to the pneumonia. I will treat the patient with IV Levaquin for the pneumonia. Follow sputum and blood cultures. Give her Mucinex. Give her DuoNebs as needed and aggressive pulmonary toilet for now.  2.  Pneumonia. This is likely community-acquired pneumonia. I will treat her with IV Levaquin. Follow blood and sputum cultures. Follow her clinically.  3.  History of chronic obstructive pulmonary disease. No acute chronic obstructive pulmonary disease exacerbation. Secondary to pneumonia, I will continue DuoNebs as  needed. Continue Spiriva and oxygen supplementation for now.  4.  History of hypertrophic cardiomyopathy. Clinically, the patient is not in congestive heart failure. Hold her torsemide and Aldactone as she has poor p.o. intake. Gently hydrate her with IV fluids for now.  5.  Neuropathy. Continue Neurontin.  6.  Hyperlipidemia. Continue lovastatin. 7.  History of gastrointestinal bleed. The patient's hemoglobin is currently stable. Continue with her aspirin and meloxicam for now. 8.  History of chronic atrial fibrillation. The patient is currently rate controlled and in sinus rhythm. Continue amiodarone and Toprol. She is not on long-term anticoagulation given her history of gastrointestinal bleed.   The patient is a DNI, DNR. The plan was discussed with the patient's daughter, who is in agreement.   TIME SPENT: 50 minutes.  ____________________________ Belia Heman. Verdell Carmine, MD vjs:sb D: 01/14/2014 15:55:50 ET T: 01/14/2014 16:15:02 ET JOB#: [id number]  cc: Belia Heman. Verdell Carmine, MD, <Dictator> Henreitta Leber MD ELECTRONICALLY SIGNED 01/18/2014 15:58

## 2014-06-07 NOTE — Discharge Summary (Signed)
PATIENT NAME:  Hailey [name], Hailey [name] MR#:  [id number] DATE OF BIRTH:  08-12-1932  DATE OF ADMISSION:  04/10/2013 DATE OF DISCHARGE:  04/11/2013  ADMISSION DIAGNOSIS: Chest pain.   DISCHARGE DIAGNOSES: 1.  Atrial fibrillation as the etiology of her chest and shortness of breath, new onset. 2.  Hypertrophic obstructive cardiomyopathy. 3.  History of nonbleeding ulcers.  4.  Hyperglycemia.  5.  Hypertension.  6.  Arthritis.   CONSULTATIONS:   [place] [facility].   PERTINENT LABORATORY DATA:  Cholesterol 163, triglycerides 281, HDL 40, LDL 67. White blood cells 6.6, hemoglobin 12, hematocrit 35, platelets of 231. Sodium 139, potassium 3.8, chloride 104, bicarb 30, BUN 20, creatinine 0.95. Glucose is 116. Magnesium 2.0. 2-D echocardiogram showed an EF of 55% to 60% with obstructive hypertrophic cardiomyopathy, gradient at rest was 29 mmHg.  Gradient with Valsalva estimated at greater than 140 mmHg. Mildly elevated pulmonary arterial systolic pressures.   HOSPITAL COURSE: A very pleasant 81 year old female who presented with chest pain, shortness of breath and atrial fibrillation, new onset. 1. Atrial fibrillation. The patient was noted to have new onset atrial fibrillation. Cardiology was consulted regarding her HOCM, as well as this atrial fibrillation. Her CHADS2 score is a 2. She has a history of GI bleed. They did not recommend anticoagulation. The patient was informed about her CHADS2 score. She was also given the option to choose anticoagulation versus no anticoagulation. Also, I explained to her the risk per CHADS2 score is about 4% per year for stroke. We have decided on aspirin 325 mg daily, given her history of ulcers. Initially, she was placed on high-dose diltiazem, but we have decreased and diltiazem to 120 mg daily. She will continue low dose metoprolol at night.   Her troponins were all negative.  2.  History of hypertrophic obstructive cardiomyopathy. As per my discussion with Dr. Rockey Situ, her  gradient has remained unchanged for now. She does have an appreciable murmur on exam consistent with her HOCM. We will continue her beta blocker and add a calcium channel blocker. She did mention that since she is on torsemide it is okay to continue this.  3.  Hypertension, well controlled.  4.  Hyperlipidemia. The patient will continue on her statin.  5.  History of gastrointestinal bleed. The patient is on Carafate for this, which she will continue. She is also on Protonix.     DISCHARGE MEDICATIONS: 1.  Lovastatin 40 mg daily.  2.  Torsemide 20 mg b.i.d.  3.  Melatonin 3 mg at bedtime.  4.  MiraLAX 17 grams daily.  5.  Protonix 40 mg daily.  6.  Advair Diskus 100/50 b.i.d.  7.  Spiriva 18 mcg daily.  8.  Tylenol 500 mg q.6h. p.r.n.  9.  Claritin 10 mg daily.  10.  Mirapex 0.25 mg daily.  11.  Carafate 1 gram 4 times a day before meals and at bedtime.  12.  Cymbalta 90 mg daily.  13.  Gabapentin 300 mg in the a.m., 200 mg in the evening and 400 mg at bedtime.  14.  Tramadol 50 mg 1 tablet b.i.d.  15.  Lopressor 50 mg half tablet at bedtime.  16.  Diltiazem 120 mg daily.  17.  Aspirin 325 daily.   DISCHARGE DIET: Low sodium.   DISCHARGE ACTIVITY: As tolerated.   DISCHARGE FOLLOWUP:  The patient will follow up with Dr. Rockey Situ in 1 week, as well as Dr. Baldemar Lenis.   The patient is medically stable for discharge.  TIME SPENT: 35 minutes.     ____________________________ Donell Beers. Benjie Karvonen, MD spm:dmm D: 04/11/2013 13:54:00 ET T: 04/11/2013 21:11:55 ET JOB#: [id number]  cc: Breindy Meadow P. Benjie Karvonen, MD, <Dictator> Minna Merritts, MD Derinda Late, MD Donell Beers [place] MD ELECTRONICALLY SIGNED 04/12/2013 13:58

## 2014-06-07 NOTE — Discharge Summary (Signed)
PATIENT NAME:  Hailey [name], Hailey [name] MR#:  [id number] DATE OF BIRTH:  [birth year]  DATE OF ADMISSION:  05/16/2013 DATE OF DISCHARGE:  05/17/2013  PRIMARY CARE PHYSICIAN: Dr. Baldemar Lenis.   CONSULTATIONS:  Dr. Rockey Situ.  DISCHARGE DIAGNOSES: 1.  Atypical chest pain.  2.  History of hypertrophic obstructive cardiomyopathy.  3.  Hypertension, diabetes, hyperlipidemia.   CONDITION: Stable.   CODE STATUS: FULL CODE.   HOME MEDICATIONS: Please refer to the medication reconciliation list. Dr. Rockey Situ added amiodarone 200 mg p.o. every 6 hours.   REASON FOR ADMISSION: Chest pain.   HOSPITAL COURSE: The patient is a 81 year old Caucasian female with a history of hyperlipidemia, hypertension, hypertrophic obstructive cardiomyopathy, presented to the ED with chest pain. The patient was diagnosed with Afib before, but not on anticoagulation due to low CHADS2 score.  In the ED, the patient was treated with nitroglycerin, aspirin and admitted for observation. For detailed history and physical examination, please refer to the admission note dictated by Dr. Margaretmary Eddy. Chest x-ray on admission date did not show any acute abnormal findings, only has mild bibasilar airspace opacity, likely atelectasis. EKG showed normal sinus rhythm with normal PR and QRS intervals.  No ST-T wave changes. Laboratory data shows troponin less than 0.02. CBC was normal.    1. Chest pain, atypical. After admission, the patient's troponin level has been negative, less than 0.02. The patient has no evidence of ACS. The patient has been treated with aspirin, beta blocker and a statin. In addition, Dr. Rockey Situ evaluated the patient and suggested continue beta blocker and also add Cardizem and amiodarone.   2.  Diabetes, has been treated with a sliding scale.  The patient's chest pain has improved, but complains of neck pain on pain medication. The patient was treated with tramadol, Norco without improvement. She still wants pain medication, but according to  the patient's daughter, the patient is addicted, so we will stop the medication. The patient is clinically stable and will be discharged to home today. Discussed the patient's discharge plan with the patient, nurse, case manager and Dr. Rockey Situ.   TIME SPENT: About 37 minutes.   ____________________________ Demetrios Loll, MD qc:dmm D: 05/17/2013 15:38:51 ET T: 05/17/2013 21:35:09 ET JOB#: [id number]  cc: Demetrios Loll, MD, <Dictator> Demetrios Loll MD ELECTRONICALLY SIGNED 05/18/2013 15:11

## 2014-06-07 NOTE — H&P (Signed)
PATIENT NAME:  Hailey [name], Hailey [name] MR#:  [id number] DATE OF BIRTH:  [birth year]  DATE OF ADMISSION:  04/10/2013  PRIMARY CARE PHYSICIAN:  Dr. Baldemar Lenis  REFERRING PHYSICIAN: Dr. Owens Shark  CHIEF COMPLAINT: Intermittent episodes of chest pain for the past 2 weeks.   HISTORY OF PRESENT ILLNESS: The patient is a 81 year old female with a past medical history of hypertropic obstructive cardiomyopathy, hypertension, hyperlipidemia, osteoarthritis, history of DVT and pulmonary embolism status post IVC not on any anticoagulation because of GI bleed from bleeding ulcers, is presenting to the ER with a chief complaint of a 2 week history of intermittent episodes of squeezing type of chest pain. The patient is reporting that the chest pain is associated with shortness of breath.  The chest pain lasts for a few minutes and it is resolved after the patient rests for a while. She sees Dr. Rockey Situ from [facility] cardiology group. She has a past medical history of deep venous thrombosis and pulmonary embolism. She had IVC filter placed.  The patient is not on any anticoagulation because of severe GI bleed in the past from bleeding ulcers. She also has reported that she had a stress test done one year ago which was normal. Here in the ER, 12-lead EKG has revealed no acute ST-T wave elevations or depressions. Initial set of troponins were negative. During my examination, the patient is chest pain-free ad denies any shortness of breath.   PAST MEDICAL HISTORY: Hypertrophic obstructive cardiomyopathy, hypertension, hyperlipidemia, osteoarthritis, fibromyalgia, history of DVT status post IVC filter placement, history of bleeding ulcers status post EGD.   PAST SURGICAL HISTORY: Hysterectomy, hernia repair, total knee replacement on the left side, brain aneurysm status post clipping, hernia.   ALLERGIES: TORADOL, CODEINE, COMPAZINE, MACRODANTIN, NSAIDS, PYRIDIUM.  HOME MEDICATIONS:  Tylenol 500 mg 1 tablet p.o. q. 6 hours as needed,  tramadol 50 mg p.o. 2 times a day, torsemide 20 mg 2 times a day, Spiriva 18 mcg inhalation once daily, Protonix 40 mg once daily, melatonin 3 mg p.o. once daily, Mirapex 0.25 mg 1 tablet p.o. once daily, MiraLAX as needed for constipation, lovastatin 40 mg once daily, Lopressor 50 mg 1/2 tablet p.o. 2 times a day, gabapentin 400 mg once a day at bedtime, 300 mg once a day in a.m. Carafate 1 gram p.o. 4 times a day, Claritin 10 mg p.o. once daily, Advair 100 mcg per 50 mcg 1 puff inhalation 2 times a day.   PSYCHOSOCIAL HISTORY: Lives with family.  She quit smoking in 2010, but she has 40 pack-year of smoking history. Denies any alcohol.    FAMILY HISTORY: Diabetes, pancreatic cancer, coronary artery disease and colon cancer runs in her family.   REVIEW OF SYSTEMS:  CONSTITUTIONAL: Denies any fever or fatigue.  EYES: Denies blurry vision, double vision.  ENT: Denies epistaxis, discharge.  RESPIRATION: Denies cough or COPD.  CARDIOVASCULAR: Had intermittent episodes of squeezing type of chest pain for the past 2 weeks, associated with shortness of breath. Denies any palpitations.  GASTROINTESTINAL: Denies nausea, vomiting, diarrhea, abdominal pain. Denies any hematemesis.  GENITOURINARY: No dysuria or hematuria.  GYNECOLOGIC AND BREASTS: Denies breast mass or vaginal discharge.  ENDOCRINE: Denies polyuria, nocturia. Denies any thyroid problems.  INTEGUMENTARY: No acne, rash, lesions.  MUSCULOSKELETAL: No joint pain in the neck and back. Denies shoulder pain.  NEUROLOGIC:  Denies vertigo, ataxia. PSYCHIATRIC: No ADD, OCD.   PHYSICAL EXAMINATION: VITAL SIGNS: Temperature 97.7, pulse 58, respirations 18, blood pressure 135/65, pulse oximetry is 93%.  GENERAL  APPEARANCE: Not in acute distress. Moderately built and nourished.  HEENT: Normocephalic, atraumatic. Pupils are equal, reacting light and accommodation. No scleral icterus. No conjunctival injection. No sinus tenderness. No postnasal drip.   NECK: Supple. No JVD or thyromegaly.  LUNGS: Clear to auscultation bilaterally. No accessory muscle usage.  No anterior chest wall tenderness on palpation.  CARDIAC: S1, S2 normal. Regular rate and rhythm. No murmurs.  GASTROINTESTINAL: Soft. Bowel sounds are positive in all 4 quadrants. Nontender, nondistended. No hepatosplenomegaly. No masses felt.  NEUROLOGIC:  Awake, alert and oriented x 3. Motor and sensory are grossly intact. Reflexes are 2+.  EXTREMITIES: No edema. No cyanosis. No clubbing.  SKIN: Warm to touch. Normal turgor. No rashes. No lesions.  MUSCULOSKELETAL: No joint effusion, tenderness or erythema.  PSYCHIATRIC: Normal mood and affect.   LABORATORIES AND IMAGING STUDIES: Troponin is less than 0.02. Glucose 128, BUN 19, creatinine 0.89, sodium 139, potassium 3.3, chloride 103, CO2 28. Anion gap is 8, GFR greater than 60, osmolality 281, calcium 9.8. WBC 8.2, hemoglobin 13.2, hematocrit 38.3, platelets 252. Accu-Chek 107. Portable chest x-ray: COPD with superimposed cardiopulmonary process.   ASSESSMENT AND PLAN: A 81 year old female with intermittent episodes of chest pain for the past 2 weeks associated with shortness of breath  will be admitted with the following assessment and plan: 1. Unstable angina. The patient will be on acute coronary syndrome protocol with oxygen, nitroglycerin, aspirin, beta blocker and statin. Cardiology consult is placed. One dose of Lovenox 1 mg/kg was given.  2.  New onset atrial fibrillation with previous history of gastrointestinal bleed.  The patient being elderly, I would not think she would be a good candidate for anticoagulation with Lovenox. Will put her on aspirin only. Cardiology consult is placed.  3.  Past medical history of pulmonary embolism, deep vein thrombosis, status post IVC filter, not on any anticoagulation with Lovenox and Coumadin in view of gastrointestinal bleed. Continue baby aspirin.  4.  History of diabetes mellitus. The  patient will be on sliding scale insulin and will resume her home medications.  5.  History of osteoarthritis. Give her pain medication as-needed basis.   She is DO NOT RESUSCITATE. Daughter is the medical power of attorney. Plan of care discussed with the patient. She is aware of the plan.   Total time spent on admission is 45 minutes.   ____________________________ Nicholes Mango, MD ag:dp D: 04/10/2013 08:53:48 ET T: 04/10/2013 10:08:54 ET JOB#: [id number]  cc: Nicholes Mango, MD, <Dictator> Derinda Late, MD    Nicholes Mango MD ELECTRONICALLY SIGNED 04/24/2013 4:38

## 2014-06-07 NOTE — Consult Note (Signed)
PATIENT NAME:  Hailey [name], HANDLER MR#:  [id number] DATE OF BIRTH:  11-Dec-1932  DATE OF CONSULTATION:  07/19/2013  CONSULTING PHYSICIAN:  Leotis Pain, MD  REASON FOR CONSULTATION:  Upper extremity weakness, as well as blurry vision.   This is a 81 year old female who presents to the hospital due to feeling generalized weakness and lethargy. The patient has chronic history of atrial fibrillation, congestive heart failure. The patient was first seen by our cardiologist. It was suspected that the patient was fluid overloaded. She is status post metolazone with increased weight loss. On presentation, the patient was found to be hyponatremic and hypokalemic. Neurology consulted for weakness bilateral upper extremities, as well as left eye blurred vision. The weakness in bilateral upper extremities has significantly improved, close to baseline. When questioned, the patient has a chronic blurred vision, which she states from the left eye. Today, she feels a lot better. The patient is status post MRI of the brain. Did not show any acute intracranial abnormality.   PAST MEDICAL HISTORY: Hypertrophic cardiomyopathy, atrial fibrillation, GI bleed, chronic obstructive pulmonary disease, depression, restless leg syndrome, hyperlipidemia.   ALLERGIES:  Include ASPIRIN, CODEINE, COMPAZINE, MACRODANTIN, NSAIDS, PYRIDIUM AND TORADOL.   SOCIAL HISTORY: A 40- to 50 pack-year smoker, which she quite about 4 or 5 years ago. No alcohol. No illicit drug use.   FAMILY HISTORY: Mother and father both deceased.   REVIEW OF SYSTEMS:  No fevers, fatigue. No weakness or recent weight loss. No postnasal drip.  No cough. No shortness of breath. No chest pain. No weakness on one side of the body compared to the other. No polyuria or nocturia.   LABORATORY DATA: Workup reviewed.   IMAGING:  MRI OF THE BRAIN: No acute intracranial abnormality.   NEUROLOGICAL EVALUATION:  The patient alert, awake, oriented to time, place and the  reason why she is in the hospital. Facial sensation intact. Facial motor intact. Tongue is midline. Uvula elevates symmetrically. Shoulder shrug intact. Motor strength in upper extremities 4+/5, lower extremities 4+/5. Reflexes are diminished. Sensation intact to light touch and temperature. Coordination is intact   IMPRESSION: A 81 year old female presenting with generalized weakness, suspicion to be fluid overloaded, status post cardiology followup with weight loss. Neurological consultation for generalized weakness of her upper extremities and left eye blurred vision. Her strength in the upper extremities significantly improved. There is a lot of give way weakness there. I suspect that is in the setting of hyponatremia and hypokalemia. Neurology also consulted for chronic left eye blurred vision. When examining her, her eye fields were intact with 1 eye or both eyes open. She states that she has an ophthalmologist and this can be followed as an outpatient. Do not see any extraocular movement abnormalities that would suggest eye positioning problems. No further imaging at this point.   Thank you, it was a pleasure seeing this patient.   ____________________________ Leotis Pain, MD yz:dmm D: 07/19/2013 17:56:26 ET T: 07/19/2013 21:10:13 ET JOB#: [id number]  cc: Leotis Pain, MD, <Dictator> Leotis Pain MD ELECTRONICALLY SIGNED 08/01/2013 17:20

## 2014-06-07 NOTE — Discharge Summary (Signed)
PATIENT NAME:  Hailey [name], Hailey [name] MR#:  [id number] DATE OF BIRTH:  May 12, 1932  DATE OF ADMISSION:  07/17/2013 DATE OF DISCHARGE:  07/20/2013  CONSULTANTS: Dr. Irish Elders from neurology.   PRIMARY CARE PHYSICIAN: Dr. Baldemar Lenis.   PRIMARY CARDIOLOGIST: Dr. Rockey Situ.   CHIEF COMPLAINT: Weakness, lethargy.   DISCHARGE DIAGNOSES:  1. Weakness due to hypokalemia and acute hyponatremia on arrival due to possible overdiuresis.  2. Hypertrophic cardiomyopathy.  3. History of atrial fibrillation.  4. Hyperlipidemia.  5. COPD.  6. History of GI bleed in the past.  7. Neuropathy.  8. Depression.  9. Restless leg syndrome.  10. Hyperlipidemia.   DISCHARGE MEDICATIONS: Lovastatin 40 mg daily, melatonin 3 mg at bedtime, Protonix 40 mg once a day, Advair 100/50 mcg 1 puff 2 times a day, Spiriva 8 units mcg 1 puff once a day, Tylenol 500 mg every 6 hours as needed for pain, Claritin 10 mg daily, Carafate 1 gram 4 times a day before meals and at bedtime, Cymbalta 90 mg once a day, gabapentin 400 mg at bedtime 200 mg at noon and also 200 mg in the morning, metoprolol succinate extended-release 25 mg at bedtime, torsemide 40 mg 2 times a day, MiraLax 17 grams once a day at bedtime, Mirapex 0.25 mg at bedtime, meloxicam 7.5 mg once a day, amiodarone 200 mg once a day, dimenhydrinate 50 mg every 8 hours as needed, potassium chloride 10 mEq at 1 cap 2 times a day.   DIET: Low sodium, low fat, low cholesterol.   ACTIVITY: As tolerated.   FOLLOWUP: Please follow with PCP for electrolyte check and general followup within 1 to 2 weeks. Please follow with your cardiologist within 1 to 2 weeks.   DISPOSITION: Home.   SIGNIFICANT LABS AND IMAGING: Initial sodium 127, potassium 2.7, last potassium 4. Last sodium 136, troponin negative x 3. Initial white count of 8, urine cultures on June 4 still being held. No growth to date. MRI C-spine without contrast: Stable cervical spine since 2014 with multilevel mild spinal stenosis  in the setting of chronic multilevel spondylolisthesis, disk and endplate degeneration. Chronic severe neural foramina low stenosis at the left C6 and C8 nerve levels. MRI of the brain with and without contrast, status post aneurysm clipping with a significant artifact adjacent to the left cavernous sinus and orbital apex. This could obscure local disease associated with patient's symptoms. Mild atrophy and moderate diffuse white matter disease. This is nonspecific but most likely related to his sequela of chronic microvascular ischemia.   HISTORY OF PRESENT ILLNESS AND HOSPITAL COURSE: For full details of H and P, please see the dictation on 06/03 by Dr. Verdell Carmine but briefly this is a 81 year old who came in feeling weak, lethargic over the past few days. Of note, the patient has chronic atrial fibrillation, history of congestive heart failure,  history of congestive heart failure and hypertrophic cardiomyopathy and had seen Dr. Rockey Situ last week before admission, and thought to be somewhat volume overloaded and was given metolazone on top of her torsemide that she was on and she lost 10 pounds of weight in a matter of a day or so per patient and came in. Of note, when she came in, she had severe hypokalemia and also hyponatremia, which likely were acute. The patient had complained of weakness and lethargy and could also have had a little bit of dehydration. This is likely due to overdiuresis, possibly. She was started on IV fluids and the diuretics were held and PT was  consulted. She had hypokalemia repleted and was on normal saline. Her acute hypokalemia and hyponatremia have resolved. She also complained of headache, left eye blurry vision, and decreased grip strength. MRI of the brain was ordered as well as MRI C-spine and the patient was seen by neurology. At this point, she has been seen by PT and she is back to baseline. Per neurology her weakness is likely due to electrolyte abnormalities and she does have  chronic left eye blurry vision and she will make appointment with ophthalmology. She was seen by PT and the recommendation was discharge to home.   PHYSICAL EXAMINATION:  VITAL SIGNS: On the day of discharge, temperature is 97.4, pulse rate 67, respiratory rate 20, blood pressure 124/67, oxygen saturation 91%.  GENERAL: The patient is an obese female lying in bed on room air, no obvious distress.  LUNGS: Clear to auscultation. Normal S1, S2.  ABDOMEN: Soft, nontender.  EXTREMITIES: No edema.   She was instructed to follow with her PCP and check a BMP for potassium and sodium next week or so.   TOTAL TIME SPENT: 35 minutes.   CODE STATUS: The patient is full code.    ____________________________ Vivien Presto, MD sa:lt D: 07/20/2013 14:15:53 ET T: 07/20/2013 23:27:12 ET JOB#: [id number]  cc: Vivien Presto, MD, <Dictator> Derinda Late, MD Minna Merritts, MD Vivien Presto MD ELECTRONICALLY SIGNED 08/13/2013 12:42

## 2014-06-07 NOTE — H&P (Signed)
PATIENT NAME:  Hailey [name], KEOUGH MR#:  [id number] DATE OF BIRTH:  1932-06-19  DATE OF ADMISSION:  07/17/2013  PRIMARY CARE PHYSICIAN: Derinda Late, MD  CARDIOLOGIST: Minna Merritts, MD  CHIEF COMPLAINT: Weakness, lethargy.   HISTORY OF PRESENT ILLNESS: This is a 81 year old female who presents to the hospital due to feeling weak and lethargic over the past few days. The patient has a history of chronic atrial fibrillation and history of congestive heart failure. She went to see her cardiologist, Dr. Rockey Situ, last week and she was thought to be somewhat volume overloaded and had some peripheral edema. The patient was given metolazone on top of the torsemide that she is already on. She says she lost about 10 pounds in a matter of a day or so. Over the past weekend, she had been feeling increasingly weak. They attempted to go see her regular doctor or even a cardiologist, but they could not see her and; therefore, they sent over to the ER for evaluation. In the Emergency Room, the patient was noted to be severely hypokalemic and hyponatremic. Hospitalist services were contacted for further treatment and evaluation. The patient denies any chest pain, any nausea, vomiting, abdominal pain, fevers, chills, cough or any other associated symptoms presently.   REVIEW OF SYSTEMS:  CONSTITUTIONAL: No documented fever. Positive fatigue and weakness. No weight gain,  about 10-pound weight loss over the past week or so.  EYES: No blurry or double vision.  ENT: No tinnitus. No postnasal drip. No redness of the oropharynx.  RESPIRATORY: No cough, no wheeze, no hemoptysis. Positive dyspnea which is chronic due to chronic obstructive pulmonary disease.  CARDIOVASCULAR: No chest pain. Positive baseline 2 to 3-pillow orthopnea. No palpitations. No syncope.  GASTROINTESTINAL: No nausea, vomiting or diarrhea. No abdominal pain. No melena or hematochezia.  GENITOURINARY: No dysuria or hematuria.  ENDOCRINE: No polyuria,  nocturia, heat or cold intolerance.  HEMATOLOGIC: No anemia, no bruising, no bleeding.  INTEGUMENTARY: No rashes or lesions.  MUSCULOSKELETAL: No arthritis. No swelling. No gout.  NEUROLOGIC: No numbness or tingling. No ataxia. No seizure-type type activity.  PSYCHIATRIC: No anxiety, no insomnia. No ADD. Positive depression.   PAST MEDICAL HISTORY: Consistent with hypertrophic cardiomyopathy, history of chronic atrial fibrillation, history of GI bleed, chronic obstructive pulmonary disease, neuropathy, depression, restless leg syndrome, hyperlipidemia.   ALLERGIES: ASPIRIN, CODEINE, COMPAZINE, MACRODANTIN, NSAIDs, PYRIDIUM AND TORADOL.   SOCIAL HISTORY: Does have a 40 to 50 pack-year smoking history, quit about 4 or 5 years ago. No alcohol abuse. No illicit drug abuse. Lives at home by herself.   FAMILY HISTORY: Mother and father are both deceased. Mother died from pancreatic cancer. Father died from colon cancer.   CURRENT MEDICATIONS: Amiodarone 200 mg daily, Cymbalta 90 mg daily, Advair 100/50 one puff b.i.d., gabapentin 300 mg in the morning, gabapentin 200 mg in the evening and 400 mg at bedtime, loratadine 10 mg daily, lovastatin 40 mg daily, melatonin 3 mg at bedtime, meloxicam 7.5 mg daily, metoprolol tartrate 25 mg b.i.d., Protonix 40 mg daily, MiraLax daily as needed, potassium 20 mEq b.i.d., pramipexole 0.25 mg daily, sucralfate 1 gram q.i.d., Spiriva 1 puff daily, torsemide 80 mg b.i.d. and metolazone 5 mg daily.   PHYSICAL EXAMINATION: Presently is as follows:  VITAL SIGNS:  Temperature is 98, pulse 64, respirations 18, blood pressure 151/73, sats 95% on 2 liters nasal cannula. GENERAL: She is a pleasant but lethargic-appearing female in no apparent distress.  HEAD, EYES, EARS, NOSE AND THROAT: Atraumatic, normocephalic.  Extraocular muscles are intact. Pupils equal and reactive to light. Sclerae anicteric. No conjunctival injection. No pharyngeal erythema.  NECK: Supple. There is no  jugular venous distention. No bruits, no lymphadenopathy, no thyromegaly.  HEART: Regular rate and rhythm. She had does have a 3 out of 6 outflow tract murmur heard at the bases. No rubs, no clicks.  LUNGS: Clear to auscultation bilaterally. No rales or rhonchi. No wheezes.  ABDOMEN: Soft, flat, nontender, nondistended. Has good bowel sounds. No hepatosplenomegaly appreciated.  EXTREMITIES: No evidence of any cyanosis, clubbing or peripheral edema. Has +2 pedal and radial pulses bilaterally.  NEUROLOGIC: The patient is alert, awake and oriented x 3 with no focal motor or sensory deficits appreciated bilaterally.  SKIN: Moist and warm with no rashes appreciated.  LYMPHATIC: There is no cervical or axillary lymphadenopathy.   LABORATORY, DIAGNOSTIC AND RADIOLOGICAL DATA:  Serum glucose 124, BUN 31, creatinine 0.8, sodium 127, potassium 2.7, chloride 86, bicarbonate 29, magnesium is 2.6. LFTs are within normal limits. Troponin less than 0.02. White cell count 8, hemoglobin 14.4, hematocrit 42.7, platelet count 230. Urinalysis within normal limits.   ASSESSMENT AND PLAN: This is a 81 year old female with a history of hypertrophic cardiomyopathy, history of chronic atrial fibrillation, history of gastrointestinal bleed, history of congestive heart failure, chronic obstructive pulmonary disease, neuropathy, depression, restless leg syndrome, hyperlipidemia who presents to the hospital due to weakness, lethargy and noted to be dehydrated with acute hyponatremia and hypokalemia.  1.  Weakness, lethargy. This is likely due to dehydration and also from her multiple electrolyte abnormalities. I will aggressively hydrate her with IV fluids and replace her sodium and potassium accordingly and will follow her clinically. If she does not clinically improve with her weakness, I would consider getting a physical therapy consult.  2.  Hyponatremia. This is likely hypovolemic hyponatremia due to aggressive diuresis. I  will hydrate her with IV fluids and follow her sodium.  3.  Hypokalemia. Also secondary to aggressive use of diuretics. Will replace her potassium accordingly and recheck in the morning.  Her magnesium level is stable.  4.  History of chronic atrial fibrillation. The patient is currently rate controlled. I will continue her amiodarone and metoprolol. She is not on anticoagulation given the fact that she has a history of a severe gastrointestinal bleed.  5.  Chronic obstructive pulmonary disease. No acute exacerbation. Continue her maintenance Advair and Spiriva.  6.  Neuropathy. Continue Neurontin.  7.  Depression. Continue Cymbalta.  8.  Hyperlipidemia. Continue lovastatin.  9.  Restless leg syndrome. Continue Requip.  10.  Gastroesophageal reflux disease. Continue Protonix.  11.  Congestive heart failure. Clinically, patient is not in congestive heart failure. I will hold her diuretics now given the fact that she is dehydrated with electrolyte abnormalities. I will continue her beta blocker for now.  12.  CODE STATUS: The patient is a full code.   TIME SPENT ON ADMISSION: 50 minutes.    ____________________________ Belia Heman. Verdell Carmine, MD vjs:cs D: 07/17/2013 19:21:59 ET T: 07/17/2013 19:50:13 ET JOB#: [id number]  cc: Belia Heman. Verdell Carmine, MD, <Dictator> Henreitta Leber MD ELECTRONICALLY SIGNED 07/20/2013 21:59

## 2014-06-07 NOTE — Consult Note (Signed)
General Aspect Hailey [name] is a 81yo female w/ PMHx s/f HOCM, h/o DVT (s/p IVC filter), h/o PUD/GIB, PAT, cerebral aneurysm, COPD, HTN, HLD and chronic anemia who was admitted to [facility] today w/ chest pain.   She is followed by Dr. Aundra Dubin, last seen 03/2012 in the office. Echo in 4/09 showed asymmetry basal septal hypertrophy with an LVOT gradient to 130 mmHg with valsalva.  She was started on Toprol XL based on this finding.  She also had a cath in 2009 without significant CAD.  Repeat echo in 7/13 on beta blocker showed mild focal basal septal hypertrophy, EF 60-65%, no significant LVOT gradient, and no MV SAM. Improved w/ Toprol-XL. Lexiscan Myoview 04/2012- EF 76%, no evidence of ischemia, no WMAs. She has baseline exertional dyspnea- multifactorial due to deconditioning, COPD. Underwent pulmonary rehab in 2014, followed by Dr. Gwenette Greet.  She has baseline exertional chest pain 1-2x/week. She resides independently at [facility]. She has to walk a significant distance from the elevator to her apartment. She will have occasional chest squeezing which resolves quickly with rest. Over the past 2 weeks, she notes these episodes have occurred more frequently, with less exertion and requiring a longer duration of rest for relief. She had a severe episode of substernal chest squeezing radiating to her neck w/ associated dyspnea. She has been more fatigued during this time. She also notes tachy-palpitations and lightheadedness. She was treated w/ antibiotics for a URI recently. She denies PND, orthopnea, LE edema or weight increase. She checked her HR yesterday, and it was 130 bpm at rest. Given her ongoing symptoms, she sought medical care at [facility] ED.   Present Illness In the ED, EKG revealed newly diagnosed atrial fibrillation, rate-controlled, ST scooping V5, V6, I, aVL. Initial TnI WNL. Labwork revealed low K at 3.3, otherwise unremarkable. CXR- COPD without superimposed process. A subsequent troponin returned WNL.  She was brought in for observation.   Allergies 1)  ! Toradol 2)  ! Macrodantin 3)  ! Codeine 4)  ! Pyridium  Past Medical/Surgical History: 1. LLE DVT - August 2010, recurrent hx.  Has IVC filter. Not on coumadin now with history of GI bleeding.   2. HYPERLIPIDEMIA (ICD-272.4) 3. GASTROESOPHAGEAL REFLUX DISEASE (ICD-530.81) 4. HOCM 5. ANEMIA, CHRONIC (ICD-281.9) 6. OSTEOARTHRITIS (ICD-715.90): History of left TKR 7. FIBROMYALGIA (ICD-729.1) 8. Hx of CEREBRAL ANEURYSM (ICD-437.3) 9. Hx of MIGRAINE HEADACHE (ICD-346.90) 10. HYPERTENSION (ICD-401.9) 11. COPD 80. Left heart cath 2009 without significant CAD.  Myoview (2/11) with EF 72%, no ischemia.  Lexiscan myoview (9/12): EF 82%, no ischemia or infarction.   13. PUD with GI bleeding: not on ASA 14. Atrial tachycardia: Patient had short runs of atrial tachycardia on 9/12 holter.  Only rare PVCs.   15. Carotid dopplers (8/13) with minimal disease bilaterally.  Family History: Family History of Colon Cancer:FATHER,BROTHER Family History of Pancreatic Cancer:MOTHER Family History of Colon Polyps:SIBLINGS, FATHER Family History of Heart Disease: FATHER, SISTERS  Social History: Occupation: RETIRED Quit smoking in 11/10 Alcohol Use - yes-SOCIALLY Daily Caffeine Use: 1 CUP COFFEE Illicit Drug Use - no Patient does not get regular exercise.   Lives in [place] at [name]. Lives in independent section.   Physical Exam:  GEN well developed, no acute distress, obese   HEENT pink conjunctivae, PERRL, hearing intact to voice   NECK supple  No masses  trachea midline   RESP clear BS  no use of accessory muscles  fine bibasilar rales, no wheezing or rhonchi  CARD Regular rate and rhythm  Normal, S1, S2  II/VI systolic murmur most prominent at RUSB   ABD denies tenderness  soft  normal BS   EXTR negative cyanosis/clubbing, negative edema   SKIN normal to palpation, skin turgor good   NEURO follows commands,  motor/sensory function intact   PSYCH alert, A+O to time, place, person   Review of Systems:  Subjective/Chief Complaint chest pain, dyspnea, fatigue, lightheadednes, palpitations   General: Fatigue   Neck: Pain   Respiratory: Short of breath   Cardiovascular: Chest pain or discomfort  Tightness  Palpitations  Dyspnea   Neurologic: Dizzness        Admit Diagnosis:   CHEST PAIN UNSTABLE ANGINA: Onset Date: 10-Apr-2013, Status: Active, Description: CHEST PAIN UNSTABLE ANGINA  Home Medications: Medication Instructions Status  lovastatin 40 mg oral tablet 1 tab(s) orally once a day (at bedtime)  Active  torsemide 20 mg oral tablet 1 tab(s) orally 2 times a day  Active  melatonin 3 mg oral tablet 1 tab(s) orally once a day (at bedtime)  Active  MiraLax oral powder for reconstitution 1 dose orally once a day (at bedtime)  Active  Protonix 40 mg oral delayed release tablet 1 tab(s) orally once a day Active  Advair Diskus 100 mcg-50 mcg inhalation powder 1 puff(s) inhaled 2 times a day Active  Spiriva 18 mcg inhalation capsule 1 puff(s) inhaled once a day Active  Lopressor 50 mg oral tablet 0.5 tab(s) orally 2 times a day Active  Tylenol 500 mg oral tablet 1 tab(s) orally every 6 hours, As Needed Active  Dramamine 50 mg oral tablet, chewable 1 tab(s) orally every 8 hours, As Needed Active  Claritin 10 mg oral tablet 1 tab(s) orally once a day Active  Mirapex 0.25 mg oral tablet 1 tab(s) orally once a day Active  Carafate 1 g oral tablet 1 tab(s) orally 4 times a day (before meals and at bedtime) Active  Cymbalta 90 milligram(s) orally once a day Active  gabapentin 300 mg oral capsule 300 milligram(s) orally once a day in am Active  gabapentin 100 mg oral capsule 2 cap(s) orally once a day in the evening Active  gabapentin 400 mg oral capsule 400 milligram(s) orally once a day (at bedtime) Active  traMADol 50 mg oral tablet 1 tab(s) orally 2 times a day Active   Lab Results:   Routine Chem:  25-Feb-15 02:23   BUN  19  Creatinine (comp) 0.89  Sodium, Serum 139  Potassium, Serum  3.3  Chloride, Serum 103  CO2, Serum 28  Calcium (Total), Serum 9.8  Anion Gap 8  Osmolality (calc) 281  eGFR (African American) >60  eGFR (Non-African American) >60 (eGFR values <69m/min/1.73 m2 may be an indication of chronic kidney disease (CKD). Calculated eGFR is useful in patients with stable renal function. The eGFR calculation will not be reliable in acutely ill patients when serum creatinine is changing rapidly. It is not useful in  patients on dialysis. The eGFR calculation may not be applicable to patients at the low and high extremes of body sizes, pregnant women, and vegetarians.)  Cardiac:  25-Feb-15 02:23   Troponin I < 0.02 (0.00-0.05 0.05 ng/mL or less: NEGATIVE  Repeat testing in 3-6 hrs  if clinically indicated. >0.05 ng/mL: POTENTIAL  MYOCARDIAL INJURY. Repeat  testing in 3-6 hrs if  clinically indicated. NOTE: An increase or decrease  of 30% or more on serial  testing suggests a  clinically important change)  07:04   CK, Total 116 (26-192 NOTE: NEW REFERENCE RANGE  03/18/2013)  CPK-MB, Serum  4.1 (Result(s) reported on 10 Apr 2013 at 07:56AM.)  Troponin I < 0.02 (0.00-0.05 0.05 ng/mL or less: NEGATIVE  Repeat testing in 3-6 hrs  if clinically indicated. >0.05 ng/mL: POTENTIAL  MYOCARDIAL INJURY. Repeat  testing in 3-6 hrs if  clinically indicated. NOTE: An increase or decrease  of 30% or more on serial  testing suggests a  clinically important change)  Routine Hem:  25-Feb-15 02:23   WBC (CBC) 8.2  RBC (CBC) 4.22  Hemoglobin (CBC) 13.2  Hematocrit (CBC) 38.3  Platelet Count (CBC) 252 (Result(s) reported on 10 Apr 2013 at 02:50AM.)  MCV 91  MCH 31.2  MCHC 34.3  RDW 13.9   EKG:  Interpretation ekg shows atrial fibrillation, ST scooping V5, V6, I, aVL   Rate 73   EKG Comparision Changed from  2014 tracing   Additional  Comments telemetry review indicates restored SR   Radiology Results: XRay:    25-Feb-15 03:01, Chest Portable Single View  Chest Portable Single View   REASON FOR EXAM:    chest tightness, new onset afib  COMMENTS:       PROCEDURE: DXR - DXR PORTABLE CHEST SINGLE VIEW  - Apr 10 2013  3:01AM     CLINICAL DATA:  Increased heart rate and tightness in chest. Pain  radiating to her neck.    EXAM:  PORTABLE CHEST - 1 VIEW    COMPARISON:  Chest radiograph report dated April 10, 2009 though  images are not available for direct comparison.    FINDINGS:  Cardio mediastinal silhouette is unremarkable. Mild chronic  interstitial changes and increased lung volumes without pleural  effusions or focal consolidations. No pneumothorax. Soft tissue  planes and included osseous structures are nonsuspicious.     IMPRESSION:  COPD without superimposed cardiopulmonary process.      Electronically Signed    AC:[id number]  Bloomer    On: 04/10/2013 03:05         Verified By: Ricky Ala, M.D.,    Codeine: N/V  Toradol: Headaches  Macrodantin: Rash  Pyridium: Other, Rash  NSAIDS: Other  Compazine: Unknown  Vital Signs/Nurse's Notes: **Vital Signs.:   25-Feb-15 06:00  Vital Signs Type Admission  Temperature Temperature (F) 97.5  Celsius 36.3  Temperature Source oral  Pulse Pulse 56  Respirations Respirations 18  Systolic BP Systolic BP 601  Diastolic BP (mmHg) Diastolic BP (mmHg) 68  Mean BP 88  Pulse Ox % Pulse Ox % 96  Pulse Ox Activity Level  At rest  Oxygen Delivery 2L    Impression 81yo female w/ PMHx s/f HOCM, h/o DVT (s/p IVC filter), h/o PUD/GIB, PAT, cerebral aneurysm, COPD, HTN, HLD and chronic anemia who was admitted to [facility] today w/ chest pain.   1. Newly diagnosed atrial fibrillation- paroxysmal  COPD and structural cardiac changes from HOCM providing substrate.  Exertional chest discomfort, dyspnea, tachy-palpitations and lightheadedness fit w/ atrial  fibrillation w/ RVR. Rates likely spike on exertion.  Recent ischemic evaluations have been normal. TnI x 2 WNL. No further chest discomfort w/ rate-control and now in SR. She has a h/o profuse GIB in the past. Required IVC filter for DVT in the past. CHADSVASc not profoundly high- 4 (HTN, age > 41, female). -- Review 2D echo -- Given HOCM, will employ BB/CCBs for rate-control first.  -- Start diltiazem 29m TID w/ meals, PRN for recurrent symptoms. If further control  needed, consider disopyramide or amiodarone. If BP drops, can decrease dose to 30 mg TID with extra doses PRN -- Obtain 3rd troponin -- Hold IVF (rales on exam, question mild diastolic dysfunction from a-fib) -- Hold on anticoagulation for now. Risk of significant GIB outweighs benefit of thromboembolic protection. --D/c home with potassium 10 meq BID (with torsemide)  2. HOCM-  Associated murmur appreciated on exam.  moderate gradient at rest, severe gradient with valsalva (seen previously) -- Continue BB, add CCB -- Avoid diuretics, vasodilators (will hold NTG, low suspicion for ACS)  3. Hypertension Well-controlled.  -- Continue antihypertensives, adding CCB  4. Hyperlipidemia -- Continue statin.   Electronic Signatures: Danella Sensing A (PA-C)  (Signed 25-Feb-15 10:27)  Authored: General Aspect/Present Illness, History and Physical Exam, Review of System, Home Medications, Labs, EKG , Radiology, Allergies, Vital Signs/Nurse's Notes, Impression/Plan Ida Rogue (MD)  (Signed 25-Feb-15 22:53)  Authored: General Aspect/Present Illness, History and Physical Exam, Health Issues, Labs, EKG , Impression/Plan  Co-Signer: General Aspect/Present Illness, Home Medications, Labs, Radiology, Allergies, Vital Signs/Nurse's Notes, Impression/Plan   Last Updated: 25-Feb-15 22:53 by Ida Rogue (MD)

## 2014-06-07 NOTE — Consult Note (Signed)
General Aspect Hailey [name] is a 81yo female w/ PMHx s/f HOCM, h/o DVT (s/p IVC filter), h/o PUD/recurrent GIB, PAT, PAF, not on anticoagulation, cerebral aneurysm, COPD, HTN, HLD and chronic anemia who was admitted to [facility] today w/ chest pain.   Previously followed by Dr. Aundra Dubin, last seen 03/2012 in the office. Echo in 4/09 showed asymmetry basal septal hypertrophy with an LVOT gradient to 130 mmHg with valsalva. She was started on Toprol XL based on this finding.  She also had a cath in 2009 without significant CAD.  Repeat echo in 7/13 on beta blocker showed mild focal basal septal hypertrophy, EF 60-65%, no significant LVOT gradient, and no MV SAM. Improved w/ Toprol-XL. Lexiscan Myoview 04/2012- EF 76%, no evidence of ischemia, no WMAs. She has baseline exertional dyspnea- multifactorial due to deconditioning, COPD. Underwent pulmonary rehab in 2014, followed by Dr. Gwenette Greet.  She was seen again in consultation 03/2013 for exertional chest pain correlated to intermittent a-fib w/ RVR. She converted to NSR with rate-control. No anticoagulation was started given her extensive GIB history and patient preference. She did rule out that admission. Echo in A&P. She was discharged on diltiazem and metoprolol tartrate. These meds were adjusted on follow-up in the office- listed below. She was outfitted w/ a 48 hour Holter monitor which has not yet been formally interpreted.   She reports unchanged exertional chest tightness and dyspnea at her nursing home. This is most noticeable walking from the elevator to her apartment. She notes an occipital headache occuring over the past week. No neurological deficits. She continues to take all medications as prescribed. Last night, she was making the bed and developed similar substernal chest tightness like before rated at a 10/10 radiating to her neck w/ associated dyspnea and diaphoresis. She does note the discomfort was worse laying flat. Thinking this may be indigestion,  she took some antacids w/ no improvement.   Present Illness . HR- 134, pulse irregular on palpation. She took an additional 1/2 tablet of Lopressor w/ minimal relief over the course of 45 minutes. The discomfort persisted and she called EMS.  In the ED, EKG showed NSR, ST scooping V5, V6, I, aVL. Initial TnI WNL, subsequent TnI. U/a- no evidence of UTI. CXR- minimal bibasilar airspace opacities likely reflect atelectasis; lungs otherwise clear. She was brought in for observation.   Allergies 1)  ! Toradol 2)  ! Macrodantin 3)  ! Codeine 4)  ! Pyridium  Past Medical/Surgical History: 1. LLE DVT - August 2010, recurrent hx.  Has IVC filter. Not on coumadin now with history of GI bleeding.   2. HYPERLIPIDEMIA (ICD-272.4) 3. GASTROESOPHAGEAL REFLUX DISEASE (ICD-530.81) 4. HOCM 5. ANEMIA, CHRONIC (ICD-281.9) 6. OSTEOARTHRITIS (ICD-715.90): History of left TKR 7. FIBROMYALGIA (ICD-729.1) 8. Hx of CEREBRAL ANEURYSM (ICD-437.3) 9. Hx of MIGRAINE HEADACHE (ICD-346.90) 10. HYPERTENSION (ICD-401.9) 11. COPD 10. Left heart cath 2009 without significant CAD.  Myoview (2/11) with EF 72%, no ischemia.  Lexiscan myoview (9/12): EF 82%, no ischemia or infarction.   13. PUD with GI bleeding: not on ASA 14. Atrial tachycardia: Patient had short runs of atrial tachycardia on 9/12 holter.  Only rare PVCs.   15. Carotid dopplers (8/13) with minimal disease bilaterally.  Family History: Family History of Colon Cancer:FATHER,BROTHER Family History of Pancreatic Cancer:MOTHER Family History of Colon Polyps:SIBLINGS, FATHER Family History of Heart Disease: FATHER, SISTERS  Social History: Occupation: RETIRED Quit smoking in 11/10 Alcohol Use - yes-SOCIALLY Daily Caffeine Use: 1 CUP COFFEE Illicit Drug Use -  no Patient does not get regular exercise.   Lives in [place] at [place]. Lives in independent section.   Physical Exam:  GEN no acute distress, obese   HEENT pink conjunctivae,  PERRL, hearing intact to voice   NECK supple  No masses  trachea midline  no JVD or bruits   RESP normal resp effort  fine bibasilar rales, no wheezing or rhonchi   CARD Regular rate and rhythm  Normal, S1, S2  II/VI systolic murmur at RU/RLSB, more intense w/ Valsalva   ABD denies tenderness  soft  normal BS   EXTR negative cyanosis/clubbing, negative edema   SKIN normal to palpation   NEURO motor/sensory function intact   PSYCH alert, A+O to time, place, person   Review of Systems:  Subjective/Chief Complaint chest pain, headache, tachycardia, insomnia   General: Fatigue  Weakness    Skin: No Complaints    ENT: No Complaints    Eyes: No Complaints    Neck: No Complaints    Respiratory: No Complaints  Short of breath   Cardiovascular: Tightness  Palpitations  Dyspnea   Gastrointestinal: No Complaints    Genitourinary: No Complaints    Vascular: No Complaints    Musculoskeletal: No Complaints    Neurologic: Headache   Hematologic: No Complaints    Endocrine: No Complaints    Psychiatric: No Complaints    Review of Systems: All other systems were reviewed and found to be negative   Medications/Allergies Reviewed Medications/Allergies reviewed    Home Medications: Medication Instructions Status  potassium chloride 10 mEq oral capsule, extended release 1 cap(s) orally 2 times a day Active  aspirin 325 mg oral tablet 1 tab(s) orally once a day Active  Lopressor 50 mg oral tablet 0.5 tab(s) orally once a day (at bedtime) Active  diltiazem 24 hour extended release 180  orally  Active  dimenhyDRINATE 50 mg oral tablet 50 milligram(s) orally every 8 hours, As Needed Active  DULoxetine 90 milligram(s) orally once a day Active  polyethylene glycol 3350 - oral powder for reconstitution  orally  Active  lovastatin 40 mg oral tablet 1 tab(s) orally once a day (at bedtime)  Active  torsemide 20 mg oral tablet 1 tab(s) orally 2 times a day  Active  melatonin 3 mg  oral tablet 1 tab(s) orally once a day (at bedtime)  Active  MiraLax oral powder for reconstitution 1 dose orally once a day (at bedtime)  Active  Protonix 40 mg oral delayed release tablet 1 tab(s) orally once a day Active  Advair Diskus 100 mcg-50 mcg inhalation powder 1 puff(s) inhaled 2 times a day Active  Spiriva 18 mcg inhalation capsule 1 puff(s) inhaled once a day Active  Tylenol 500 mg oral tablet 1 tab(s) orally every 6 hours, As Needed Active  Claritin 10 mg oral tablet 1 tab(s) orally once a day Active  Mirapex 0.25 mg oral tablet 1 tab(s) orally once a day Active  Carafate 1 g oral tablet 1 tab(s) orally 4 times a day (before meals and at bedtime) Active  Cymbalta 90 milligram(s) orally once a day Active  gabapentin 300 mg oral capsule 300 milligram(s) orally once a day in am Active  gabapentin 100 mg oral capsule 2 cap(s) orally once a day in the evening Active  gabapentin 400 mg oral capsule 400 milligram(s) orally once a day (at bedtime) Active   Lab Results:  Routine Chem:  02-Apr-15 04:28   Glucose, Serum  214  BUN 15  Creatinine (comp) 0.89  Sodium, Serum 142  Potassium, Serum 3.6  Chloride, Serum  108  CO2, Serum 26  Calcium (Total), Serum 8.9  Anion Gap 8  Osmolality (calc) 290  eGFR (African American) >60  eGFR (Non-African American) >60 (eGFR values <38m/min/1.73 m2 may be an indication of chronic kidney disease (CKD). Calculated eGFR is useful in patients with stable renal function. The eGFR calculation will not be reliable in acutely ill patients when serum creatinine is changing rapidly. It is not useful in  patients on dialysis. The eGFR calculation may not be applicable to patients at the low and high extremes of body sizes, pregnant women, and vegetarians.)  Cardiac:  02-Apr-15 04:28   CPK-MB, Serum  4.2 (Result(s) reported on 16 May 2013 at 08:13AM.)  Troponin I < 0.02 (0.00-0.05 0.05 ng/mL or less: NEGATIVE  Repeat testing in 3-6 hrs  if  clinically indicated. >0.05 ng/mL: POTENTIAL  MYOCARDIAL INJURY. Repeat  testing in 3-6 hrs if  clinically indicated. NOTE: An increase or decrease  of 30% or more on serial  testing suggests a  clinically important change)    08:04   CPK-MB, Serum  4.0 (Result(s) reported on 16 May 2013 at 09:29AM.)  Troponin I < 0.02 (0.00-0.05 0.05 ng/mL or less: NEGATIVE  Repeat testing in 3-6 hrs  if clinically indicated. >0.05 ng/mL: POTENTIAL  MYOCARDIAL INJURY. Repeat  testing in 3-6 hrs if  clinically indicated. NOTE: An increase or decrease  of 30% or more on serial  testing suggests a  clinically important change)  Routine UA:  02-Apr-15 06:03   Color (UA) Yellow  Clarity (UA) Clear  Glucose (UA) 50 mg/dL  Bilirubin (UA) Negative  Ketones (UA) Negative  Specific Gravity (UA) 1.012  Blood (UA) Negative  pH (UA) 6.0  Protein (UA) Negative  Nitrite (UA) Negative  Leukocyte Esterase (UA) Trace (Result(s) reported on 16 May 2013 at 06:29AM.)  RBC (UA) 1 /HPF  WBC (UA) 2 /HPF  Bacteria (UA) NONE SEEN  Epithelial Cells (UA) 1 /HPF (Result(s) reported on 16 May 2013 at 06:29AM.)  Routine Coag:  02-Apr-15 04:28   Prothrombin 11.9  INR 0.9 (INR reference interval applies to patients on anticoagulant therapy. A single INR therapeutic range for coumarins is not optimal for all indications; however, the suggested range for most indications is 2.0 - 3.0. Exceptions to the INR Reference Range may include: Prosthetic heart valves, acute myocardial infarction, prevention of myocardial infarction, and combinations of aspirin and anticoagulant. The need for a higher or lower target INR must be assessed individually. Reference: The Pharmacology and Management of the Vitamin K  antagonists: the seventh ACCP Conference on Antithrombotic and Thrombolytic Therapy. [id number].7494Sept:126 (3suppl): [id number] A HCT value >55% may artifactually increase the PT.  In one study,  the increase was an  average of 25%. Reference:  "Effect on Routine and Special Coagulation Testing Values of Citrate Anticoagulant Adjustment in Patients with High HCT Values." American Journal of Clinical Pathology 2006;126:400-405.)  Routine Hem:  02-Apr-15 04:28   WBC (CBC) 5.4  RBC (CBC) 3.87  Hemoglobin (CBC) 12.0  Hematocrit (CBC) 35.9  Platelet Count (CBC) 236 (Result(s) reported on 16 May 2013 at 04:55AM.)  MCV 93  MCH 31.0  MCHC 33.5  RDW  14.6   EKG:  Interpretation EKG shows NSR, downsloping ST depressions I, II, V4-V6   Rate 61   EKG Comparision Not changed from  prior office tracings   Radiology Results: XRay:    02-Apr-15 04:44,  Chest Portable Single View  Chest Portable Single View   REASON FOR EXAM:    Chest Pain  COMMENTS:       PROCEDURE: DXR - DXR PORTABLE CHEST SINGLE VIEW  - May 16 2013  4:44AM     CLINICAL DATA:  Upper chest pain, radiating to the throat.    EXAM:  PORTABLE CHEST - 1 VIEW    COMPARISON:  Chest radiograph performed 04/10/2013    FINDINGS:  The lungs are well-aerated. Minimal bibasilar opacities likely  reflect atelectasis. There is no evidence of pleural effusion or  pneumothorax.    The cardiomediastinal silhouette is within normal limits. No acute  osseous abnormalities are seen.     IMPRESSION:  Minimal bibasilar airspace opacities likely reflect atelectasis;  lungs otherwise clear.      Electronically Signed    By: Garald Balding M.D.    On: 05/16/2013 04:54       Verified By: JEFFREY . CHANG, M.D.,    Codeine: N/V  Toradol: Headaches  Macrodantin: Rash  Pyridium: Other, Rash  Aspirin: Bleeding  NSAIDS: Other  Compazine: Unknown  Vital Signs/Nurse's Notes: **Vital Signs.:   02-Apr-15 09:10  Vital Signs Type Admission  Temperature Temperature (F) 97.8  Celsius 36.5  Temperature Source oral  Pulse Pulse 56  Respirations Respirations 18  Systolic BP Systolic BP 662  Diastolic BP (mmHg) Diastolic BP (mmHg) 74  Mean BP 98   Pulse Ox % Pulse Ox % 90  Pulse Ox Activity Level  At rest  Oxygen Delivery Room Air/ 21 %    Impression 81yo female w/ PMHx s/f HOCM, h/o DVT (s/p IVC filter), h/o PUD/recurrent GIB, PAT, PAF, not on anticoagulation, cerebral aneurysm, COPD, HTN, HLD and chronic anemia who was admitted to [facility] today w/ chest pain.   1. Paroxysmal atrial fibrillation w/ RVR started last night, rate in teh 130s, very symptomatic  supply-demand mismatch w/ underlying HOCM.  Ruled out in February. Normal Myoview last year. No significant CAD on 2009 cath.  TnI WNL x 2 this admission. CHA2DS2VASc = 4. Not an anticoagulation candidate given significant GIBs in the past.  -- Will start AAD- disopyramide would provide negative inotropic effect for breakthrough a-fib on BB and CCBs. Could consider amiodarone as well.  -- Cycle 3rd troponin --will start amiodarone 200 mg po q6 hours for now for arrhythmia/atrial fib if no further arrhythmia, could d/c from cardiac perspective tomorrow  2. HOCM-   Associated murmur appreciated on exam. Echo in 03/2012- EF 55-60%, HOCM- LVOT gradient 29 mmHg at rest, >140 mmHg w/ Valsalva, mild LVH, mild MR, normal RV size/function -- Continue BB,  CCB,amiodarone for afib  3. Precordial pain- see above.  -- Would start PPI +/- H2 blocker +/ antacid to cover for possible reflux/GERD symptoms  4. Hypertension Well-controlled.  -- Continue antihypertensives  5. Hyperlipidemia -- Continue statin.  6. Occipital headache- states Tylenol ineffective. Intolerances to Toradol, NSAIDs. -- Ultram 72m q12hr PRN or vicodin   Electronic Signatures: Tonni Mansour A (PA-C)  (Signed 02-Apr-15 10:49)  Authored: General Aspect/Present Illness, History and Physical Exam, Review of System, Home Medications, Labs, EKG , Radiology, Allergies, Vital Signs/Nurse's Notes, Impression/Plan Hailey [name](MD)  (Signed 02-Apr-15 19:43)  Authored: General Aspect/Present Illness, Review of System,  EKG , Impression/Plan  Co-Signer: General Aspect/Present Illness, History and Physical Exam, Review of System, Home Medications, Labs, EKG , Radiology, Allergies, Vital Signs/Nurse's Notes, Impression/Plan   Last Updated: 02-Apr-15 19:43 by Hailey [name](MD)

## 2014-06-10 DIAGNOSIS — R300 Dysuria: Secondary | ICD-10-CM | POA: Diagnosis not present

## 2014-06-19 ENCOUNTER — Emergency Department
Admission: EM | Admit: 2014-06-19 | Discharge: 2014-06-19 | Disposition: A | Discharge status: Home / Self Care | Payer: Commercial Managed Care - HMO | Attending: Student | Admitting: Student

## 2014-06-19 ENCOUNTER — Other Ambulatory Visit: Payer: Self-pay

## 2014-06-19 ENCOUNTER — Encounter: Payer: Self-pay | Admitting: *Deleted

## 2014-06-19 DIAGNOSIS — Z791 Long term (current) use of non-steroidal anti-inflammatories (NSAID): Secondary | ICD-10-CM | POA: Insufficient documentation

## 2014-06-19 DIAGNOSIS — Z87891 Personal history of nicotine dependence: Secondary | ICD-10-CM | POA: Insufficient documentation

## 2014-06-19 DIAGNOSIS — Z79899 Other long term (current) drug therapy: Secondary | ICD-10-CM | POA: Diagnosis not present

## 2014-06-19 DIAGNOSIS — I10 Essential (primary) hypertension: Secondary | ICD-10-CM | POA: Diagnosis not present

## 2014-06-19 DIAGNOSIS — N390 Urinary tract infection, site not specified: Secondary | ICD-10-CM | POA: Diagnosis not present

## 2014-06-19 DIAGNOSIS — I959 Hypotension, unspecified: Secondary | ICD-10-CM | POA: Insufficient documentation

## 2014-06-19 LAB — BASIC METABOLIC PANEL
Anion gap: 15 (ref 5–15)
BUN: 28 mg/dL — AB (ref 6–20)
CO2: 26 mmol/L (ref 22–32)
CREATININE: 1.11 mg/dL — AB (ref 0.44–1.00)
Calcium: 9.3 mg/dL (ref 8.9–10.3)
Chloride: 98 mmol/L — ABNORMAL LOW (ref 101–111)
GFR calc Af Amer: 53 mL/min — ABNORMAL LOW (ref >60)
GFR, EST NON AFRICAN AMERICAN: 46 mL/min — AB (ref >60)
Glucose, Bld: 123 mg/dL — ABNORMAL HIGH (ref 65–99)
Potassium: 4 mmol/L (ref 3.5–5.1)
Sodium: 139 mmol/L (ref 135–145)

## 2014-06-19 LAB — URINALYSIS COMPLETE WITH MICROSCOPIC (ARMC ONLY)
Bilirubin Urine: NEGATIVE
Glucose, UA: NEGATIVE mg/dL
Ketones, ur: NEGATIVE mg/dL
NITRITE: POSITIVE — AB
PROTEIN: NEGATIVE mg/dL
Specific Gravity, Urine: 1.01 (ref 1.005–1.030)
pH: 5 (ref 5.0–8.0)

## 2014-06-19 LAB — CBC
HCT: 32.9 % — ABNORMAL LOW (ref 35.0–47.0)
HEMOGLOBIN: 11 g/dL — AB (ref 12.0–16.0)
MCH: 30.2 pg (ref 26.0–34.0)
MCHC: 33.4 g/dL (ref 32.0–36.0)
MCV: 90.2 fL (ref 80.0–100.0)
PLATELETS: 272 10*3/uL (ref 150–440)
RBC: 3.65 MIL/uL — ABNORMAL LOW (ref 3.80–5.20)
RDW: 15.6 % — ABNORMAL HIGH (ref 11.5–14.5)
WBC: 6.9 10*3/uL (ref 3.6–11.0)

## 2014-06-19 LAB — TROPONIN I: Troponin I: <0.03 ng/mL (ref <0.031)

## 2014-06-19 MED ORDER — CEPHALEXIN 500 MG PO CAPS
500.0000 mg | ORAL_CAPSULE | Freq: Once | ORAL | Status: AC
  Administered 2014-06-19: 500 mg via ORAL

## 2014-06-19 MED ORDER — CEPHALEXIN 500 MG PO CAPS
ORAL_CAPSULE | ORAL | Status: AC
  Administered 2014-06-19: 500 mg via ORAL
  Filled 2014-06-19: qty 1

## 2014-06-19 MED ORDER — CEPHALEXIN 500 MG PO CAPS: 500.0000 mg | ORAL_CAPSULE | Freq: Four times a day (QID) | ORAL | Status: AC

## 2014-06-19 NOTE — ED Notes (Signed)
Vitals reassessed

## 2014-06-19 NOTE — ED Notes (Signed)
Pt reports she was at the urologist office today for reoccurring bladder infection evaluation. She reports a the office her bp was in the 50's (55/44, and 60/50, hr 57) sent here for further evaluation. Pt reports she felt shaky and tired.

## 2014-06-19 NOTE — ED Notes (Signed)
Pt informed to return if life threatening symptoms occur.   

## 2014-06-19 NOTE — ED Notes (Signed)
MD at bedside to update patient

## 2014-06-19 NOTE — ED Provider Notes (Signed)
[facility] Emergency Department Provider Note    ____________________________________________  Time seen: ----------------------------------------- 6:28 PM on 06/19/2014 -----------------------------------------    I have reviewed the triage vital signs and the nursing notes.   HISTORY  Chief Complaint Hypotension       HPI JENESIS MARTIN is a 81 y.o. female with history of DVT status post IVC filter, not chronically anticoagulated secondary to prior GI bleed, hyperlipidemia, GERD, hypertrophic cardiomyopathy, chronic anemia, recurrent urinary tract infections presents for hypotension. She was being seen at the urology clinic today due to persistent "bladder pain". She was diagnosed with urinary tract infection last week and finished Macrobid approximately 3 days ago her symptoms persisted. In clinic she was noted to be hypotensive so she was sent to the ER for further evaluation. Denies any associated chest pain or difficulty breathing. She denies any recent fevers, nausea vomiting or diarrhea. No modifying factors. Currently she feels well. Severity mild     Past Medical History  Diagnosis Date  . DVT (deep vein thrombosis) in pregnancy 09/2008    a. LLE, recurrent hx, has IVC filter. Not on coumadin now with history of GI bleeding  . Hyperlipidemia   . GERD (gastroesophageal reflux disease)   . Hypertrophic cardiomyopathy     a. Echo 4/09 with EF 70-75%, asymmetricy basal septal hypertrophy, mild MR without systolic anterior motion of the mitral valve, LVOT gradient to 130 mmHg with Valsalva b. Echo 7/13: EF 60-65%, mild focal basal septal hypertrophy, no significant LVOT gradient, no MV SAM c. Echo 2/15: EF 55-60%, HOCM, resting LVOT gradient 29 mmHg, Valsalva LVOT gradient > 140 mmHg, mild LVH, mild TR, elevated PASP  . Anemia     Chronic  . Osteoarthritis     Hx of left TKR  . Fibromyalgia   . Cerebral aneurysm     Hx of  . Migraine headache     Hx of  . Hypertension   . COPD (chronic obstructive pulmonary disease)   . PUD (peptic ulcer disease)     With GI bleeding  . History of colonoscopy   . PAF (paroxysmal atrial fibrillation)     a. Full-dose ASA alone 2/2 h/o GIB.  Marland Kitchen OSA (obstructive sleep apnea)     a. Intolerant to CPAP, wears 2L via n/c  . Fibromyalgia   . Anxiety   . PAT (paroxysmal atrial tachycardia)   . 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 Pneumonia     Patient Active Problem List   Diagnosis Date Noted  . CTCL (cutaneous T-cell lymphoma) 09/18/2013  . PAF (paroxysmal atrial fibrillation)   . Chronic diastolic CHF (congestive heart failure) 07/12/2013  . Bilateral leg edema 06/06/2013  . Paroxysmal atrial fibrillation 04/25/2013  . DOE (dyspnea on exertion) 10/11/2011  . COPD (chronic obstructive pulmonary disease) 10/09/2011  . Cognitive decline 10/09/2011  . Behavior concern 08/23/2011  . Chest pain 10/26/2010  . Palpitations 10/26/2010  . PHLEBITIS AND THROMBOPHLEBITIS OF FEMORAL VEIN 10/13/2008  . DEEP VENOUS THROMBOPHLEBITIS, LEG, LEFT 10/09/2008  . GASTRIC ULCER, ACUTE 10/09/2008  . PERSONAL HX COLONIC POLYPS 09/02/2008  . FATIGUE 08/27/2008  . Hypertrophic obstructive cardiomyopathy(425.11) 11/06/2007  . HYPOTENSION, ORTHOSTATIC 11/06/2007  . ADENOMATOUS COLONIC POLYP 04/12/2007  . HIATAL HERNIA 04/12/2007  . Diverticulosis of colon (without mention of hemorrhage) 04/12/2007  . Hyperlipidemia 02/03/2007  . ANEMIA, CHRONIC 02/03/2007  . MIGRAINE HEADACHE 02/03/2007  . Essential hypertension 02/03/2007  . CEREBRAL ANEURYSM 02/03/2007  . GASTROESOPHAGEAL  REFLUX DISEASE 02/03/2007  . OSTEOARTHRITIS 02/03/2007  . FIBROMYALGIA 02/03/2007  . CHRONIC FATIGUE SYNDROME 02/03/2007  . MITRAL VALVE PROLAPSE, HX OF 02/03/2007  . PULMONARY EMBOLISM, HX OF 02/03/2007  . TOTAL KNEE REPLACEMENT, LEFT, HX OF 02/03/2007  . HYSTERECTOMY, HX OF 02/03/2007  . Other acquired  absence of organ 02/03/2007  . INGUINAL HERNIORRHAPHY, RIGHT, HX OF 02/03/2007    Past Surgical History  Procedure Laterality Date  . Total knee arthroplasty      Left  . Bladder suspension    . Foot surgery      Bilateral  . Inguinal hernia repair      Right  . Cerebral aneurysm repair    . Abdominal hysterectomy    . Appendectomy    . Tonsillectomy and adenoidectomy    . Shoulder surgery      Right  . Knee arthroscopy      Right  . Cardiac catheterization  2009    No significant CAD  . Cardiovascular stress test      a. Lexiscan Myoview 3/14: EF 76%, no evidence of ischemia or WMAs    Current Outpatient Rx  Name  Route  Sig  Dispense  Refill  . Acetaminophen (TYLENOL EXTRA STRENGTH PO)   Oral   Take by mouth as needed.         Marland Kitchen amiodarone (PACERONE) 200 MG tablet   Oral   Take 1 tablet (200 mg total) by mouth daily. Take an additional tablet if you have breakthrough arrhythmias.   60 tablet   3   . dimenhyDRINATE (DRAMAMINE) 50 MG tablet   Oral   Take 50 mg by mouth every 8 (eight) hours as needed.           . DULoxetine (CYMBALTA) 60 MG capsule   Oral   Take 90 mg by mouth daily.         . Fluticasone-Salmeterol (ADVAIR DISKUS) 100-50 MCG/DOSE AEPB   Inhalation   Inhale 1 puff into the lungs every 12 (twelve) hours.   14 each   0   . gabapentin (NEURONTIN) 100 MG capsule   Oral   Take 200 mg by mouth every evening. Once daily in evening         . gabapentin (NEURONTIN) 300 MG capsule   Oral   Take 300 mg by mouth every morning. Once daily in am         . gabapentin (NEURONTIN) 400 MG capsule   Oral   Take 400 mg by mouth at bedtime.         . lovastatin (MEVACOR) 40 MG tablet   Oral   Take 40 mg by mouth daily.           . Melatonin 3 MG TABS   Oral   Take 1 tablet by mouth at bedtime.           . meloxicam (MOBIC) 7.5 MG tablet   Oral   Take 7.5 mg by mouth daily.         . metoprolol succinate (TOPROL-XL) 25 MG 24 hr  tablet   Oral   Take 25 mg by mouth at bedtime.          . NON FORMULARY      Oxygen 1 liter.         . pantoprazole (PROTONIX) 40 MG tablet   Oral   Take 40 mg by mouth daily.         . polyethylene  glycol (MIRALAX / GLYCOLAX) packet   Oral   Take 17 g by mouth daily.         . potassium chloride SA (K-DUR,KLOR-CON) 20 MEQ tablet   Oral   Take 1 tablet (20 mEq total) by mouth 2 (two) times daily.   60 tablet   3   . sucralfate (CARAFATE) 1 G tablet   Oral   Take 1 g by mouth 4 (four) times daily -  with meals and at bedtime.          Marland Kitchen tiotropium (SPIRIVA) 18 MCG inhalation capsule   Inhalation   Place 18 mcg into inhaler and inhale as needed.           . torsemide (DEMADEX) 20 MG tablet      TAKE 3 TABLETS DAILY. TAKE 1 ADDITIONAL TABLET IF YOU GAIN 2LBS IN 1 NIGHT FOR A TOTAL 80MG  THAT DAY   90 tablet   3   . torsemide (DEMADEX) 20 MG tablet      TAKE THREE TABLETS EVERY DAY (MAY TAKE 1ADDITIONAL TABLET IF YOU GAIN2 LBS IN 1NIGHT FOR A TOTAL OF 80MG  THAT DAY   90 tablet   3   . triamcinolone ointment (KENALOG) 0.1 %   Topical   Apply 1 application topically 2 (two) times daily.         Marland Kitchen loratadine (CLARITIN) 10 MG tablet   Oral   Take 10 mg by mouth daily.         Marland Kitchen losartan (COZAAR) 50 MG tablet   Oral   Take 1 tablet (50 mg total) by mouth daily.   30 tablet   6   . pramipexole (MIRAPEX) 0.25 MG tablet   Oral   Take 1 tablet by mouth daily.         Marland Kitchen torsemide (DEMADEX) 20 MG tablet   Oral   Take 20 mg by mouth 2 (two) times daily. Take 20 mg 2 tablets twice a day.           Allergies Aspirin; Codeine; Compazine; Ketorolac tromethamine; Macrodantin; Nitrofurantoin; Nsaids; Phenazopyridine hcl; and Pyridium plus  Family History  Problem Relation Age of Onset  . Pancreatic cancer Mother   . Colon cancer Father   . Colon polyps Father   . Heart disease Father   . Heart disease Sister     More than 1 sister  . Colon  cancer Brother   . Colon polyps Other     Siblings    Social History History  Substance Use Topics  . Smoking status: Former Smoker -- 1.00 packs/day for 60 years    Types: Cigarettes    Quit date: 12/15/2008  . Smokeless tobacco: Not on file  . Alcohol Use: Yes     Comment: Socially    Review of Systems  Constitutional: Negative for fever. Eyes: Negative for visual changes. ENT: Negative for sore throat. Cardiovascular: Negative for chest pain. Respiratory: Negative for shortness of breath. Gastrointestinal: Negative for abdominal pain, vomiting and diarrhea. Genitourinary: Positive for "bladder pain" and  for dysuria. Musculoskeletal: Negative for back pain. Skin: Negative for rash. Neurological: Negative for headaches, focal weakness or numbness.  10-point ROS otherwise negative.  ____________________________________________   PHYSICAL EXAM:  VITAL SIGNS: ED Triage Vitals  Enc Vitals Group     BP 06/19/14 1657 112/44 mmHg     Pulse Rate 06/19/14 1657 58     Resp 06/19/14 1657 16     Temp 06/19/14 1657  98.2 F (36.8 C)     Temp Source 06/19/14 1657 Oral     SpO2 06/19/14 1657 92 %     Weight 06/19/14 1657 187 lb (84.823 kg)     Height 06/19/14 1657 5\' 2"  (1.575 m)     Head Cir --      Peak Flow --      Pain Score --      Pain Loc --      Pain Edu? --      Excl. in [place]? --      Constitutional: Alert and oriented. Well appearing and in no distress. Eyes: Conjunctivae are normal. PERRL. Normal extraocular movements. ENT   Head: Normocephalic and atraumatic.   Nose: No congestion/rhinnorhea.   Mouth/Throat: Mucous membranes are moist.   Neck: No stridor. Hematological/Lymphatic/Immunilogical: No cervical lymphadenopathy. Cardiovascular: Normal rate, regular rhythm. Normal and symmetric distal pulses are present in all extremities. No murmurs, rubs, or gallops. Respiratory: Normal respiratory effort without tachypnea nor retractions. Breath  sounds are clear and equal bilaterally. No wheezes/rales/rhonchi. Gastrointestinal: Soft and nontender. No distention. No abdominal bruits. There is no CVA tenderness. Genitourinary: deferred Musculoskeletal: Nontender with normal range of motion in all extremities. No joint effusions.  No lower extremity tenderness nor edema. Neurologic:  Normal speech and language. No gross focal neurologic deficits are appreciated. Speech is normal. No gait instability. Skin:  Skin is warm, dry and intact. No rash noted. Psychiatric: Mood and affect are normal. Speech and behavior are normal. Patient exhibits appropriate insight and judgment.  ____________________________________________    LABS (pertinent positives/negatives)  Generally unremarkable with the exception of nitrite positive UTI  ____________________________________________   EKG  ED ECG REPORT   Date: 06/19/2014  EKG Time: 17:16   Rate: 58   Rhythm: Junctional rhythm  Axis: Normal  Intervals: Junctional rhythm  ST&T Change: No acute ST segment change.   ____________________________________________    RADIOLOGY  None  ____________________________________________   PROCEDURES  Procedure(s) performed: None  Critical Care performed: No  ____________________________________________   INITIAL IMPRESSION / ASSESSMENT AND PLAN / ED COURSE  Pertinent labs & imaging results that were available during my care of the patient were reviewed by me and considered in my medical decision making (see chart for details).  ANGILA WOMBLES is a 81 y.o. female with history of DVT status post IVC filter, not chronically anticoagulated secondary to prior GI bleed, hyperlipidemia, GERD, hypertrophic cardiomyopathy, chronic anemia, recurrent urinary tract infections presents for hypotension which has resolved in the emergency department without any intervention. O2 sat is 96% on her home 2 L oxygen requirement. Labs only remarkable for  nitrite positive UTI, we'll obtain cultures and discharge home with Keflex. She currently feels well. She'll follow up with urology. Return precautions discussed she and daughter are comfortable with discharge plan.  ____________________________________________   FINAL CLINICAL IMPRESSION(S) / ED DIAGNOSES  Final diagnoses:  UTI (lower urinary tract infection)  Hypotension, unspecified hypotension type     Joanne Gavel, MD 06/19/14 2037

## 2014-06-24 DIAGNOSIS — J069 Acute upper respiratory infection, unspecified: Secondary | ICD-10-CM | POA: Diagnosis not present

## 2014-07-02 ENCOUNTER — Other Ambulatory Visit: Payer: Self-pay | Admitting: Urology

## 2014-07-02 DIAGNOSIS — R310 Gross hematuria: Secondary | ICD-10-CM

## 2014-07-04 ENCOUNTER — Other Ambulatory Visit: Payer: Self-pay | Admitting: Cardiovascular Disease

## 2014-07-04 ENCOUNTER — Ambulatory Visit
Admission: RE | Admit: 2014-07-04 | Discharge: 2014-07-04 | Disposition: A | Discharge status: Home / Self Care | Payer: Commercial Managed Care - HMO | Source: Ambulatory Visit | Attending: Urology | Admitting: Urology

## 2014-07-04 DIAGNOSIS — Z90710 Acquired absence of both cervix and uterus: Secondary | ICD-10-CM | POA: Diagnosis not present

## 2014-07-04 DIAGNOSIS — R310 Gross hematuria: Secondary | ICD-10-CM | POA: Diagnosis not present

## 2014-07-04 DIAGNOSIS — N281 Cyst of kidney, acquired: Secondary | ICD-10-CM | POA: Insufficient documentation

## 2014-07-04 MED ORDER — IOHEXOL 300 MG/ML  SOLN
110.0000 mL | Freq: Once | INTRAMUSCULAR | Status: AC | PRN
  Administered 2014-07-04: 110 mL via INTRAVENOUS

## 2014-07-08 DIAGNOSIS — R310 Gross hematuria: Secondary | ICD-10-CM | POA: Diagnosis not present

## 2014-07-08 DIAGNOSIS — N390 Urinary tract infection, site not specified: Secondary | ICD-10-CM | POA: Diagnosis not present

## 2014-07-08 DIAGNOSIS — R339 Retention of urine, unspecified: Secondary | ICD-10-CM | POA: Diagnosis not present

## 2014-07-17 ENCOUNTER — Other Ambulatory Visit: Payer: Self-pay | Admitting: Urology

## 2014-07-18 ENCOUNTER — Ambulatory Visit (INDEPENDENT_AMBULATORY_CARE_PROVIDER_SITE_OTHER): Payer: Commercial Managed Care - HMO

## 2014-07-18 DIAGNOSIS — R339 Retention of urine, unspecified: Secondary | ICD-10-CM

## 2014-07-18 NOTE — Progress Notes (Signed)
In and Out Catheterization  Patient is present today for a I & O catheterization due to possible UTI. Patient was cleaned and prepped in a sterile fashion with betadine and Lidocaine 2% jelly was instilled into the urethra.  A 14 FR cath was inserted no complications were noted , 50 ml of urine return was noted, urine was green in color from uribel. A clean urine sample was collected for culture. Bladder was drained  And catheter was removed with out difficulty.    Preformed by: Toniann Fail, LPN

## 2014-07-21 ENCOUNTER — Telehealth: Payer: Self-pay

## 2014-07-21 LAB — CULTURE, URINE COMPREHENSIVE

## 2014-07-21 MED ORDER — AMOXICILLIN-POT CLAVULANATE 875-125 MG PO TABS: 1.0000 | ORAL_TABLET | Freq: Two times a day (BID) | ORAL | Status: AC

## 2014-07-21 NOTE — Telephone Encounter (Signed)
We should probably start an antibiotic.

## 2014-07-21 NOTE — Telephone Encounter (Signed)
-----   Message from Nori Riis, PA-C sent at 07/21/2014  2:20 PM EDT ----- Patient needs to start Augmentin 875/125 one tablet twice daily for seven days.  We then need to recheck a cath specimen 3 to five days after finishing her antibiotic.

## 2014-07-21 NOTE — Telephone Encounter (Signed)
-----   Message from Nori Riis, PA-C sent at 07/21/2014  8:28 AM EDT ----- Was this a cath specimen?

## 2014-07-21 NOTE — Telephone Encounter (Signed)
Yes, this is a cath specimen. Cw,lpn

## 2014-07-21 NOTE — Telephone Encounter (Signed)
Spoke with pt. Made aware of infection and to return 3-5 after completing medication for a cath specimen. Pt voiced understanding. Medication sent to pharmacy. Cw,lpn

## 2014-07-24 DIAGNOSIS — Z79899 Other long term (current) drug therapy: Secondary | ICD-10-CM | POA: Diagnosis not present

## 2014-07-24 DIAGNOSIS — R42 Dizziness and giddiness: Secondary | ICD-10-CM | POA: Diagnosis not present

## 2014-07-24 DIAGNOSIS — I951 Orthostatic hypotension: Secondary | ICD-10-CM | POA: Diagnosis not present

## 2014-07-24 DIAGNOSIS — I509 Heart failure, unspecified: Secondary | ICD-10-CM | POA: Diagnosis not present

## 2014-07-30 ENCOUNTER — Encounter: Payer: Self-pay | Admitting: *Deleted

## 2014-07-30 ENCOUNTER — Emergency Department: Payer: Commercial Managed Care - HMO

## 2014-07-30 ENCOUNTER — Emergency Department
Admission: EM | Admit: 2014-07-30 | Discharge: 2014-07-31 | Disposition: A | Discharge status: Home / Self Care | Payer: Commercial Managed Care - HMO | Attending: Emergency Medicine | Admitting: Emergency Medicine

## 2014-07-30 DIAGNOSIS — Z79899 Other long term (current) drug therapy: Secondary | ICD-10-CM | POA: Diagnosis not present

## 2014-07-30 DIAGNOSIS — R1110 Vomiting, unspecified: Secondary | ICD-10-CM | POA: Diagnosis not present

## 2014-07-30 DIAGNOSIS — R42 Dizziness and giddiness: Secondary | ICD-10-CM

## 2014-07-30 DIAGNOSIS — E876 Hypokalemia: Secondary | ICD-10-CM | POA: Insufficient documentation

## 2014-07-30 DIAGNOSIS — R112 Nausea with vomiting, unspecified: Secondary | ICD-10-CM | POA: Diagnosis not present

## 2014-07-30 DIAGNOSIS — K862 Cyst of pancreas: Secondary | ICD-10-CM | POA: Diagnosis not present

## 2014-07-30 DIAGNOSIS — K429 Umbilical hernia without obstruction or gangrene: Secondary | ICD-10-CM | POA: Diagnosis not present

## 2014-07-30 DIAGNOSIS — H579 Unspecified disorder of eye and adnexa: Secondary | ICD-10-CM | POA: Diagnosis not present

## 2014-07-30 DIAGNOSIS — Z87891 Personal history of nicotine dependence: Secondary | ICD-10-CM | POA: Diagnosis not present

## 2014-07-30 DIAGNOSIS — N281 Cyst of kidney, acquired: Secondary | ICD-10-CM | POA: Diagnosis not present

## 2014-07-30 DIAGNOSIS — H538 Other visual disturbances: Secondary | ICD-10-CM | POA: Diagnosis not present

## 2014-07-30 HISTORY — DX: Unspecified asthma, uncomplicated: J45.909

## 2014-07-30 HISTORY — DX: Heart failure, unspecified: I50.9

## 2014-07-30 LAB — CBC
HCT: 31.6 % — ABNORMAL LOW (ref 35.0–47.0)
Hemoglobin: 10.3 g/dL — ABNORMAL LOW (ref 12.0–16.0)
MCH: 29.8 pg (ref 26.0–34.0)
MCHC: 32.6 g/dL (ref 32.0–36.0)
MCV: 91.5 fL (ref 80.0–100.0)
PLATELETS: 234 10*3/uL (ref 150–440)
RBC: 3.46 MIL/uL — ABNORMAL LOW (ref 3.80–5.20)
RDW: 15.1 % — AB (ref 11.5–14.5)
WBC: 5.5 10*3/uL (ref 3.6–11.0)

## 2014-07-30 LAB — GLUCOSE, CAPILLARY: Glucose-Capillary: 162 mg/dL — ABNORMAL HIGH (ref 65–99)

## 2014-07-30 LAB — BASIC METABOLIC PANEL
Anion gap: 9 (ref 5–15)
BUN: 14 mg/dL (ref 6–20)
CO2: 28 mmol/L (ref 22–32)
CREATININE: 0.96 mg/dL (ref 0.44–1.00)
Calcium: 8.6 mg/dL — ABNORMAL LOW (ref 8.9–10.3)
Chloride: 102 mmol/L (ref 101–111)
GFR calc Af Amer: >60 mL/min (ref >60)
GFR calc non Af Amer: 55 mL/min — ABNORMAL LOW (ref >60)
Glucose, Bld: 149 mg/dL — ABNORMAL HIGH (ref 65–99)
Potassium: 2.7 mmol/L — CL (ref 3.5–5.1)
SODIUM: 139 mmol/L (ref 135–145)

## 2014-07-30 LAB — TROPONIN I: Troponin I: <0.03 ng/mL (ref <0.031)

## 2014-07-30 MED ORDER — POTASSIUM CHLORIDE 20 MEQ PO PACK
PACK | ORAL | Status: AC
  Administered 2014-07-30: 40 meq via ORAL
  Filled 2014-07-30: qty 2

## 2014-07-30 MED ORDER — POTASSIUM CHLORIDE 20 MEQ PO PACK
40.0000 meq | PACK | Freq: Once | ORAL | Status: AC
  Administered 2014-07-30: 40 meq via ORAL

## 2014-07-30 MED ORDER — ONDANSETRON HCL 4 MG/2ML IJ SOLN
INTRAMUSCULAR | Status: AC
  Administered 2014-07-30: 4 mg via INTRAVENOUS
  Filled 2014-07-30: qty 2

## 2014-07-30 MED ORDER — ONDANSETRON HCL 4 MG/2ML IJ SOLN
4.0000 mg | Freq: Once | INTRAMUSCULAR | Status: AC
  Administered 2014-07-30: 4 mg via INTRAVENOUS

## 2014-07-30 MED ORDER — POTASSIUM CHLORIDE 10 MEQ/100ML IV SOLN
10.0000 meq | INTRAVENOUS | Status: AC
  Administered 2014-07-31 (×2): 10 meq via INTRAVENOUS
  Filled 2014-07-30 (×4): qty 100

## 2014-07-30 MED ORDER — IOHEXOL 240 MG/ML SOLN
25.0000 mL | Freq: Once | INTRAMUSCULAR | Status: AC | PRN
  Administered 2014-07-31: 25 mL via ORAL

## 2014-07-30 NOTE — ED Notes (Signed)
Pt arrives via EMS from home with c/o blurry vision, nausea/vomiting, dizziness for several hours.

## 2014-07-30 NOTE — ED Notes (Signed)
Taking to daughter updating her on her mother

## 2014-07-31 ENCOUNTER — Emergency Department: Payer: Commercial Managed Care - HMO

## 2014-07-31 DIAGNOSIS — K429 Umbilical hernia without obstruction or gangrene: Secondary | ICD-10-CM | POA: Diagnosis not present

## 2014-07-31 DIAGNOSIS — Z79899 Other long term (current) drug therapy: Secondary | ICD-10-CM | POA: Diagnosis not present

## 2014-07-31 DIAGNOSIS — N281 Cyst of kidney, acquired: Secondary | ICD-10-CM | POA: Diagnosis not present

## 2014-07-31 DIAGNOSIS — K862 Cyst of pancreas: Secondary | ICD-10-CM | POA: Diagnosis not present

## 2014-07-31 DIAGNOSIS — Z87891 Personal history of nicotine dependence: Secondary | ICD-10-CM | POA: Diagnosis not present

## 2014-07-31 DIAGNOSIS — H538 Other visual disturbances: Secondary | ICD-10-CM | POA: Diagnosis not present

## 2014-07-31 DIAGNOSIS — E876 Hypokalemia: Secondary | ICD-10-CM | POA: Diagnosis not present

## 2014-07-31 DIAGNOSIS — R42 Dizziness and giddiness: Secondary | ICD-10-CM | POA: Diagnosis not present

## 2014-07-31 LAB — HEPATIC FUNCTION PANEL
ALT: 16 U/L (ref 14–54)
AST: 21 U/L (ref 15–41)
Albumin: 3.8 g/dL (ref 3.5–5.0)
Alkaline Phosphatase: 93 U/L (ref 38–126)
BILIRUBIN TOTAL: 0.4 mg/dL (ref 0.3–1.2)
Total Protein: 6.4 g/dL — ABNORMAL LOW (ref 6.5–8.1)

## 2014-07-31 LAB — LIPASE, BLOOD: LIPASE: 28 U/L (ref 22–51)

## 2014-07-31 LAB — URINALYSIS COMPLETE WITH MICROSCOPIC (ARMC ONLY)
BACTERIA UA: NONE SEEN
BILIRUBIN URINE: NEGATIVE
GLUCOSE, UA: NEGATIVE mg/dL
Ketones, ur: NEGATIVE mg/dL
LEUKOCYTES UA: NEGATIVE
Nitrite: NEGATIVE
PROTEIN: NEGATIVE mg/dL
Specific Gravity, Urine: 1.006 (ref 1.005–1.030)
pH: 7 (ref 5.0–8.0)

## 2014-07-31 MED ORDER — IOHEXOL 300 MG/ML  SOLN
100.0000 mL | Freq: Once | INTRAMUSCULAR | Status: AC | PRN
  Administered 2014-07-31: 100 mL via INTRAVENOUS

## 2014-07-31 MED ORDER — METOCLOPRAMIDE HCL 5 MG/ML IJ SOLN
INTRAMUSCULAR | Status: AC
  Administered 2014-07-31: 10 mg via INTRAVENOUS
  Filled 2014-07-31: qty 2

## 2014-07-31 MED ORDER — POTASSIUM CHLORIDE 20 MEQ PO PACK
40.0000 meq | PACK | Freq: Once | ORAL | Status: AC
  Administered 2014-07-31: 40 meq via ORAL

## 2014-07-31 MED ORDER — METOCLOPRAMIDE HCL 5 MG/ML IJ SOLN
10.0000 mg | Freq: Once | INTRAMUSCULAR | Status: AC
  Administered 2014-07-31: 10 mg via INTRAVENOUS

## 2014-07-31 MED ORDER — POTASSIUM CHLORIDE 20 MEQ PO PACK
PACK | ORAL | Status: AC
  Administered 2014-07-31: 40 meq via ORAL
  Filled 2014-07-31: qty 2

## 2014-07-31 MED ORDER — ONDANSETRON HCL 4 MG/2ML IJ SOLN
4.0000 mg | Freq: Once | INTRAMUSCULAR | Status: AC
  Administered 2014-07-31: 4 mg via INTRAVENOUS

## 2014-07-31 MED ORDER — DIPHENHYDRAMINE HCL 50 MG/ML IJ SOLN
INTRAMUSCULAR | Status: AC
  Administered 2014-07-31: 25 mg via INTRAVENOUS
  Filled 2014-07-31: qty 1

## 2014-07-31 MED ORDER — DIPHENHYDRAMINE HCL 50 MG/ML IJ SOLN
25.0000 mg | Freq: Once | INTRAMUSCULAR | Status: AC
  Administered 2014-07-31: 25 mg via INTRAVENOUS

## 2014-07-31 MED ORDER — ONDANSETRON HCL 4 MG/2ML IJ SOLN
INTRAMUSCULAR | Status: AC
  Administered 2014-07-31: 4 mg via INTRAVENOUS
  Filled 2014-07-31: qty 2

## 2014-07-31 NOTE — ED Notes (Signed)
Pt alert and in NAD at time of d/c to son.

## 2014-07-31 NOTE — ED Notes (Addendum)
Pt up to the bathroom stand and pivot 1-2 person assist. Over 1000 ml of urine and specimen at [facility]

## 2014-07-31 NOTE — ED Notes (Signed)
Pt asking for more nausea meds at this time.

## 2014-07-31 NOTE — ED Provider Notes (Signed)
[facility] Emergency Department Provider Note  ____________________________________________  Time seen: 11:15 PM  I have reviewed the triage vital signs and the nursing notes.   HISTORY  Chief Complaint Dizziness    HPI Hailey [name] is a 81 y.o. female who reports that she was in her usual state of health today and then this evening around 8:30 PM she developed blurry vision nausea vomiting and dizziness. She reports that she did not have any unilateral visual disturbance but that it felt like her eyes were crossed side, and her vision seemed to be affected on both sides equally. She denied any numbness tingling or weakness or headache. She is now feeling better but just feels weak overall. She does describe some vague abdominal pain but is unable to localize it or further characterize it. She denies chest pain shortness of breath fever or chills. No diarrhea. She does take diuretics for a history of CHF.  According to the patient's daughter, is concerned that the patient is not managing her medications well she has not been using her pillbox. They're concerned that she may be noncompliant with her medications.  The patient reports a recent UTI that was managed by urology Dr. Elnoria Howard. The patient states that she recently completed a course of antibiotics for this.      Past Medical History  Diagnosis Date  . DVT (deep vein thrombosis) in pregnancy 09/2008    a. LLE, recurrent hx, has IVC filter. Not on coumadin now with history of GI bleeding  . Hyperlipidemia   . GERD (gastroesophageal reflux disease)   . Hypertrophic cardiomyopathy     a. Echo 4/09 with EF 70-75%, asymmetricy basal septal hypertrophy, mild MR without systolic anterior motion of the mitral valve, LVOT gradient to 130 mmHg with Valsalva b. Echo 7/13: EF 60-65%, mild focal basal septal hypertrophy, no significant LVOT gradient, no MV SAM c. Echo 2/15: EF 55-60%, HOCM, resting LVOT gradient 29  mmHg, Valsalva LVOT gradient > 140 mmHg, mild LVH, mild TR, elevated PASP  . Anemia     Chronic  . Osteoarthritis     Hx of left TKR  . Fibromyalgia   . Cerebral aneurysm     Hx of  . Migraine headache     Hx of  . Hypertension   . COPD (chronic obstructive pulmonary disease)   . PUD (peptic ulcer disease)     With GI bleeding  . History of colonoscopy   . PAF (paroxysmal atrial fibrillation)     a. Full-dose ASA alone 2/2 h/o GIB.  Marland Kitchen OSA (obstructive sleep apnea)     a. Intolerant to CPAP, wears 2L via n/c  . Fibromyalgia   . Anxiety   . PAT (paroxysmal atrial tachycardia)   . Pneumonia   . 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 CTCL (cutaneous T-cell lymphoma) 09/18/2013  . PAF (paroxysmal atrial fibrillation)   . Chronic diastolic CHF (congestive heart failure) 07/12/2013  . Bilateral leg edema 06/06/2013  . Paroxysmal atrial fibrillation 04/25/2013  . DOE (dyspnea on exertion) 10/11/2011  . COPD (chronic obstructive pulmonary disease) 10/09/2011  . Cognitive decline 10/09/2011  . Behavior concern 08/23/2011  . Chest pain 10/26/2010  . Palpitations 10/26/2010  . PHLEBITIS AND THROMBOPHLEBITIS OF FEMORAL VEIN 10/13/2008  . DEEP VENOUS THROMBOPHLEBITIS, LEG, LEFT 10/09/2008  . GASTRIC ULCER, ACUTE 10/09/2008  . PERSONAL HX COLONIC POLYPS 09/02/2008  .  FATIGUE 08/27/2008  . Hypertrophic obstructive cardiomyopathy(425.11) 11/06/2007  . HYPOTENSION, ORTHOSTATIC 11/06/2007  . ADENOMATOUS COLONIC POLYP 04/12/2007  . HIATAL HERNIA 04/12/2007  . Diverticulosis of colon (without mention of hemorrhage) 04/12/2007  . Hyperlipidemia 02/03/2007  . ANEMIA, CHRONIC 02/03/2007  . MIGRAINE HEADACHE 02/03/2007  . Essential hypertension 02/03/2007  . CEREBRAL ANEURYSM 02/03/2007  . GASTROESOPHAGEAL REFLUX DISEASE 02/03/2007  . OSTEOARTHRITIS 02/03/2007  . FIBROMYALGIA 02/03/2007  . CHRONIC  FATIGUE SYNDROME 02/03/2007  . MITRAL VALVE PROLAPSE, HX OF 02/03/2007  . PULMONARY EMBOLISM, HX OF 02/03/2007  . TOTAL KNEE REPLACEMENT, LEFT, HX OF 02/03/2007  . HYSTERECTOMY, HX OF 02/03/2007  . Other acquired absence of organ 02/03/2007  . INGUINAL HERNIORRHAPHY, RIGHT, HX OF 02/03/2007    Past Surgical History  Procedure Laterality Date  . Total knee arthroplasty      Left  . Bladder suspension    . Foot surgery      Bilateral  . Inguinal hernia repair      Right  . Cerebral aneurysm repair    . Abdominal hysterectomy    . Appendectomy    . Tonsillectomy and adenoidectomy    . Shoulder surgery      Right  . Knee arthroscopy      Right  . Cardiac catheterization  2009    No significant CAD  . Cardiovascular stress test      a. Lexiscan Myoview 3/14: EF 76%, no evidence of ischemia or WMAs    Current Outpatient Rx  Name  Route  Sig  Dispense  Refill  . amiodarone (PACERONE) 200 MG tablet   Oral   Take 1 tablet (200 mg total) by mouth daily. Take an additional tablet if you have breakthrough arrhythmias. Patient taking differently: Take 200 mg by mouth every 6 (six) hours.    60 tablet   3   . docusate sodium (COLACE) 100 MG capsule   Oral   Take 100 mg by mouth 2 (two) times daily as needed for mild constipation.         . DULoxetine (CYMBALTA) 30 MG capsule   Oral   Take 30 mg by mouth daily. Pt takes with a 60mg  capsule.         . DULoxetine (CYMBALTA) 60 MG capsule   Oral   Take 60 mg by mouth daily. Pt takes with a 30mg  capsule.         . fluticasone (FLONASE) 50 MCG/ACT nasal spray   Each Nare   Place 2 sprays into both nostrils daily as needed for rhinitis.         . Fluticasone-Salmeterol (ADVAIR DISKUS) 100-50 MCG/DOSE AEPB   Inhalation   Inhale 1 puff into the lungs every 12 (twelve) hours.   14 each   0   . gabapentin (NEURONTIN) 100 MG capsule   Oral   Take 200-400 mg by mouth 3 (three) times daily. Pt takes three in the morning,  two in the evening, and four at bedtime.         Marland Kitchen guaifenesin (HUMIBID E) 400 MG TABS tablet   Oral   Take 400 mg by mouth every 4 (four) hours as needed (for congestion).         Marland Kitchen loratadine (CLARITIN) 10 MG tablet   Oral   Take 10 mg by mouth daily as needed for allergies.          Marland Kitchen losartan (COZAAR) 50 MG tablet   Oral   Take 1 tablet (  50 mg total) by mouth daily.   30 tablet   6   . lovastatin (MEVACOR) 40 MG tablet   Oral   Take 40 mg by mouth daily.           . metoprolol succinate (TOPROL-XL) 25 MG 24 hr tablet   Oral   Take 25 mg by mouth daily.          . pantoprazole (PROTONIX) 40 MG tablet   Oral   Take 40 mg by mouth daily.         . potassium chloride (K-DUR) 10 MEQ tablet   Oral   Take 10 mEq by mouth 2 (two) times daily.         . pramipexole (MIRAPEX) 0.25 MG tablet   Oral   Take 1 tablet by mouth at bedtime.          . senna-docusate (SENOKOT-S) 8.6-50 MG per tablet   Oral   Take 1 tablet by mouth daily as needed for mild constipation.         . sucralfate (CARAFATE) 1 G tablet   Oral   Take 1 g by mouth 2 (two) times daily before a meal.          . tiotropium (SPIRIVA) 18 MCG inhalation capsule   Inhalation   Place 18 mcg into inhaler and inhale daily.         Marland Kitchen torsemide (DEMADEX) 20 MG tablet   Oral   Take 60 mg by mouth daily. Pt may take one additional tablet if she gains 2lbs in one night.         . triamcinolone ointment (KENALOG) 0.1 %   Topical   Apply 1 application topically 2 (two) times daily as needed (for rash).          . potassium chloride SA (K-DUR,KLOR-CON) 20 MEQ tablet   Oral   Take 1 tablet (20 mEq total) by mouth 2 (two) times daily. Patient not taking: Reported on 07/30/2014   60 tablet   3     Allergies Aspirin; Codeine; Compazine; Ketorolac tromethamine; Nsaids; Phenazopyridine hcl; and Macrodantin  Family History  Problem Relation Age of Onset  . Pancreatic cancer Mother   .  Colon cancer Father   . Colon polyps Father   . Heart disease Father   . Heart disease Sister     More than 1 sister  . Colon cancer Brother   . Colon polyps Other     Siblings    Social History History  Substance Use Topics  . Smoking status: Former Smoker -- 1.00 packs/day for 60 years    Types: Cigarettes    Quit date: 12/15/2008  . Smokeless tobacco: Not on file  . Alcohol Use: Yes     Comment: Socially    Review of Systems  Constitutional: No fever or chills. No weight changes Eyes:blurry vision as above.  ENT: No sore throat. Cardiovascular: No chest pain. Respiratory: No dyspnea or cough. Gastrointestinal:Generalized abdominal pain and vomiting. No diarrhea.a.  No BRBPR or melena. Genitourinary: Negative for dysuria, urinary retention, bloody urine, or difficulty urinating.Recent UTI that is now resolved  Musculoskeletal: Negative for back pain. No joint swelling or pain. Skin: Negative for rash. Neurological: Negative for headaches, focal weakness or numbness. Psychiatric:No anxiety or depression.   Endocrine:No hot/cold intolerance, changes in energy, or sleep difficulty.  10-point ROS otherwise negative.  ____________________________________________   PHYSICAL EXAM:  VITAL SIGNS: ED Triage Vitals  Enc Vitals Group  BP 07/30/14 2230 179/64 mmHg     Pulse Rate 07/30/14 2157 50     Resp 07/30/14 2157 16     Temp 07/30/14 2157 97.9 F (36.6 C)     Temp Source 07/30/14 2157 Oral     SpO2 07/30/14 2157 87 %     Weight 07/30/14 2157 182 lb (82.555 kg)     Height 07/30/14 2157 5\' 2"  (1.575 m)     Head Cir --      Peak Flow --      Pain Score --      Pain Loc --      Pain Edu? --      Excl. in [place]? --      Constitutional: Alert and oriented. Well appearing and in no distress. Eyes: No scleral icterus. No conjunctival pallor. PERRL. EOMI ENT   Head: Normocephalic and atraumatic.   Nose: No congestion/rhinnorhea. No septal hematoma    Mouth/Throat: MMM, no pharyngeal erythema. No peritonsillar mass. No uvula shift.   Neck: No stridor. No SubQ emphysema. No meningismus. Hematological/Lymphatic/Immunilogical: No cervical lymphadenopathy. Cardiovascular: RRR. Normal and symmetric distal pulses are present in all extremities. No murmurs, rubs, or gallops. Respiratory: Normal respiratory effort without tachypnea nor retractions. Breath sounds are clear and equal bilaterally. No wheezes/rales/rhonchi. Gastrointestinal: Softwith diffuse upper abdominal mild tenderness. No distention. There is no CVA tenderness.  No rebound, rigidity, or guarding.5-6 cm supraumbilical ventral hernia without any palpable mass or tenderness, chronic per patient.  Genitourinary: deferred Musculoskeletal: Nontender with normal range of motion in all extremities. No joint effusions.  No lower extremity tenderness.  No edema. Neurologic:   Normal speech and language.  CN 2-10 normal. Motor grossly intact. No pronator drift.  Normal gait. No gross focal neurologic deficits are appreciated.  Skin:  Skin is warm, dry and intact. No rash noted.  No petechiae, purpura, or bullae. Psychiatric: Mood and affect are normal. Speech and behavior are normal. Patient exhibits appropriate insight and judgment.  ____________________________________________    LABS (pertinent positives/negatives) (all labs ordered are listed, but only abnormal results are displayed) Labs Reviewed  CBC - Abnormal; Notable for the following:    RBC 3.46 (*)    Hemoglobin 10.3 (*)    HCT 31.6 (*)    RDW 15.1 (*)    All other components within normal limits  BASIC METABOLIC PANEL - Abnormal; Notable for the following:    Potassium 2.7 (*)    Glucose, Bld 149 (*)    Calcium 8.6 (*)    GFR calc non Af Amer 55 (*)    All other components within normal limits  URINALYSIS COMPLETEWITH MICROSCOPIC ([facility] ONLY) - Abnormal; Notable for the following:    Color, Urine YELLOW (*)     APPearance CLEAR (*)    Hgb urine dipstick 1+ (*)    Squamous Epithelial / LPF 0-5 (*)    All other components within normal limits  GLUCOSE, CAPILLARY - Abnormal; Notable for the following:    Glucose-Capillary 162 (*)    All other components within normal limits  TROPONIN I  HEPATIC FUNCTION PANEL  LIPASE, BLOOD  CBG MONITORING, ED   ____________________________________________   EKG  Interpreted by me Sinus bradycardia with a rate of 50, first-degree atrioventricular block with PR interval 242 ms. Normal axis. Poor R-wave progression in anterior leads, normal ST segments and T waves. Mild U waves are present  ____________________________________________    RADIOLOGY  Chest x-ray unremarkable  CT head unremarkable CT  abdomen and pelvis unremarkable except for small super umbilical hernia which contains fat. ____________________________________________   PROCEDURES  ____________________________________________   INITIAL IMPRESSION / ASSESSMENT AND PLAN / ED COURSE  Pertinent labs & imaging results that were available during my care of the patient were reviewed by me and considered in my medical decision making (see chart for details).  Patient presents with sudden onset of visual disturbance with nausea vomiting and dizziness. We'll CT her head to evaluate for intracranial hemorrhage. Low suspicion of ischemic stroke. Due to the abdominal pain and tenderness with the vomiting, will also CT her abdomen pelvis. She does have a history of GI bleed, but she does not have any complaints suggesting an acute bleed at this time. Initial labs do reveal hypokalemia which may be related to her diuretic use and contributing to her symptoms.   ----------------------------------------- 3:43 AM on 07/31/2014 -----------------------------------------  Patient sleeping comfortably, easily arousable. States she feels better. Repeat examination of abdomen reveals soft nontender  nondistended no rebound rigidity or guarding. Vital signs remain within normal limits. Workup is unremarkable including CT of the head and abdomen pelvis. Low suspicion of intracranial hemorrhage or any abdominal pathology. Symptoms appear to be related to hypokalemia which may be related to medication noncompliance with her chronic oral potassium prescription.. ____________________________________________   FINAL CLINICAL IMPRESSION(S) / ED DIAGNOSES  Final diagnoses:  Acute hypokalemia      Carrie Mew, MD 07/31/14 [phone]

## 2014-07-31 NOTE — ED Notes (Signed)
Pt back from CT of Head waiting on her to drink contrast

## 2014-07-31 NOTE — Discharge Instructions (Signed)
Dizziness °Dizziness is a common problem. It is a feeling of unsteadiness or light-headedness. You may feel like you are about to faint. Dizziness can lead to injury if you stumble or fall. A person of any age group can suffer from dizziness, but dizziness is more common in older adults. °CAUSES  °Dizziness can be caused by many different things, including: °· Middle ear problems. °· Standing for too long. °· Infections. °· An allergic reaction. °· Aging. °· An emotional response to something, such as the sight of blood. °· Side effects of medicines. °· Tiredness. °· Problems with circulation or blood pressure. °· Excessive use of alcohol or medicines, or illegal drug use. °· Breathing too fast (hyperventilation). °· An irregular heart rhythm (arrhythmia). °· A low red blood cell count (anemia). °· Pregnancy. °· Vomiting, diarrhea, fever, or other illnesses that cause body fluid loss (dehydration). °· Diseases or conditions such as Parkinson's disease, high blood pressure (hypertension), diabetes, and thyroid problems. °· Exposure to extreme heat. °DIAGNOSIS  °Your health care provider will ask about your symptoms, perform a physical exam, and perform an electrocardiogram (ECG) to record the electrical activity of your heart. Your health care provider may also perform other heart or blood tests to determine the cause of your dizziness. These may include: °· Transthoracic echocardiogram (TTE). During echocardiography, sound waves are used to evaluate how blood flows through your heart. °· Transesophageal echocardiogram (TEE). °· Cardiac monitoring. This allows your health care provider to monitor your heart rate and rhythm in real time. °· Holter monitor. This is a portable device that records your heartbeat and can help diagnose heart arrhythmias. It allows your health care provider to track your heart activity for several days if needed. °· Stress tests by exercise or by giving medicine that makes the heart beat  faster. °TREATMENT  °Treatment of dizziness depends on the cause of your symptoms and can vary greatly. °HOME CARE INSTRUCTIONS  °· Drink enough fluids to keep your urine clear or pale yellow. This is especially important in very hot weather. In older adults, it is also important in cold weather. °· Take your medicine exactly as directed if your dizziness is caused by medicines. When taking blood pressure medicines, it is especially important to get up slowly. °· Rise slowly from chairs and steady yourself until you feel okay. °· In the morning, first sit up on the side of the bed. When you feel okay, stand slowly while holding onto something until you know your balance is fine. °· Move your legs often if you need to stand in one place for a long time. Tighten and relax your muscles in your legs while standing. °· Have someone stay with you for 1-2 days if dizziness continues to be a problem. Do this until you feel you are well enough to stay alone. Have the person call your health care provider if he or she notices changes in you that are concerning. °· Do not drive or use heavy machinery if you feel dizzy. °· Do not drink alcohol. °SEEK IMMEDIATE MEDICAL CARE IF:  °· Your dizziness or light-headedness gets worse. °· You feel nauseous or vomit. °· You have problems talking, walking, or using your arms, hands, or legs. °· You feel weak. °· You are not thinking clearly or you have trouble forming sentences. It may take a friend or family member to notice this. °· You have chest pain, abdominal pain, shortness of breath, or sweating. °· Your vision changes. °· You notice   any bleeding.  You have side effects from medicine that seems to be getting worse rather than better. MAKE SURE YOU:   Understand these instructions.  Will watch your condition.  Will get help right away if you are not doing well or get worse. Document Released: 07/27/2000 Document Revised: 02/05/2013 Document Reviewed: 08/20/2010 [facility]  Patient Information 2015 [place], [place]. This information is not intended to replace advice given to you by your health care provider. Make sure you discuss any questions you have with your health care provider.  Hypokalemia Hypokalemia means that the amount of potassium in the blood is lower than normal.Potassium is a chemical, called an electrolyte, that helps regulate the amount of fluid in the body. It also stimulates muscle contraction and helps nerves function properly.Most of the body's potassium is inside of cells, and only a very small amount is in the blood. Because the amount in the blood is so small, minor changes can be life-threatening. CAUSES  Antibiotics.  Diarrhea or vomiting.  Using laxatives too much, which can cause diarrhea.  Chronic kidney disease.  Water pills (diuretics).  Eating disorders (bulimia).  Low magnesium level.  Sweating a lot. SIGNS AND SYMPTOMS  Weakness.  Constipation.  Fatigue.  Muscle cramps.  Mental confusion.  Skipped heartbeats or irregular heartbeat (palpitations).  Tingling or numbness. DIAGNOSIS  Your health care provider can diagnose hypokalemia with blood tests. In addition to checking your potassium level, your health care provider may also check other lab tests. TREATMENT Hypokalemia can be treated with potassium supplements taken by mouth or adjustments in your current medicines. If your potassium level is very low, you may need to get potassium through a vein (IV) and be monitored in the hospital. A diet high in potassium is also helpful. Foods high in potassium are:  Nuts, such as peanuts and pistachios.  Seeds, such as sunflower seeds and pumpkin seeds.  Peas, lentils, and lima beans.  Whole grain and bran cereals and breads.  Fresh fruit and vegetables, such as apricots, avocado, bananas, cantaloupe, kiwi, oranges, tomatoes, asparagus, and potatoes.  Orange and tomato juices.  Red meats.  Fruit  yogurt. HOME CARE INSTRUCTIONS  Take all medicines as prescribed by your health care provider.  Maintain a healthy diet by including nutritious food, such as fruits, vegetables, nuts, whole grains, and lean meats.  If you are taking a laxative, be sure to follow the directions on the label. SEEK MEDICAL CARE IF:  Your weakness gets worse.  You feel your heart pounding or racing.  You are vomiting or having diarrhea.  You are diabetic and having trouble keeping your blood glucose in the normal range. SEEK IMMEDIATE MEDICAL CARE IF:  You have chest pain, shortness of breath, or dizziness.  You are vomiting or having diarrhea for more than 2 days.  You faint. MAKE SURE YOU:   Understand these instructions.  Will watch your condition.  Will get help right away if you are not doing well or get worse. Document Released: 01/31/2005 Document Revised: 11/21/2012 Document Reviewed: 08/03/2012 [facility] Patient Information 2015 [place], [place]. This information is not intended to replace advice given to you by your health care provider. Make sure you discuss any questions you have with your health care provider.

## 2014-07-31 NOTE — ED Notes (Signed)
Contrast finished and CT called

## 2014-07-31 NOTE — ED Notes (Signed)
Called pharmacy for another bag of K+ IVPB

## 2014-07-31 NOTE — ED Notes (Signed)
K+ IVPB started at [facility]

## 2014-07-31 NOTE — ED Notes (Signed)
Daughter on phone with RN, daughter states she is going to come get her.

## 2014-08-06 ENCOUNTER — Other Ambulatory Visit: Payer: Self-pay | Admitting: *Deleted

## 2014-08-06 ENCOUNTER — Encounter: Payer: Self-pay | Admitting: *Deleted

## 2014-08-07 ENCOUNTER — Encounter: Payer: Self-pay | Admitting: Urology

## 2014-08-07 ENCOUNTER — Ambulatory Visit (INDEPENDENT_AMBULATORY_CARE_PROVIDER_SITE_OTHER): Payer: Commercial Managed Care - HMO | Admitting: Urology

## 2014-08-07 VITALS — BP 126/60 | HR 63 | Ht 61.0 in | Wt 187.6 lb

## 2014-08-07 DIAGNOSIS — N390 Urinary tract infection, site not specified: Secondary | ICD-10-CM

## 2014-08-07 DIAGNOSIS — R310 Gross hematuria: Secondary | ICD-10-CM | POA: Diagnosis not present

## 2014-08-07 DIAGNOSIS — R339 Retention of urine, unspecified: Secondary | ICD-10-CM

## 2014-08-07 DIAGNOSIS — N952 Postmenopausal atrophic vaginitis: Secondary | ICD-10-CM

## 2014-08-07 LAB — URINALYSIS, COMPLETE
Bilirubin, UA: NEGATIVE
Glucose, UA: NEGATIVE
Ketones, UA: NEGATIVE
Nitrite, UA: NEGATIVE
Protein, UA: NEGATIVE
RBC, UA: NEGATIVE
Specific Gravity, UA: 1.01 (ref 1.005–1.030)
UUROB: 0.2 mg/dL (ref 0.2–1.0)
pH, UA: 5.5 (ref 5.0–7.5)

## 2014-08-07 LAB — MICROSCOPIC EXAMINATION

## 2014-08-07 MED ORDER — LIDOCAINE HCL 2 % EX GEL
1.0000 "application " | Freq: Once | CUTANEOUS | Status: AC
  Administered 2014-08-07: 1 via URETHRAL

## 2014-08-07 MED ORDER — CIPROFLOXACIN HCL 500 MG PO TABS
500.0000 mg | ORAL_TABLET | Freq: Once | ORAL | Status: AC
  Administered 2014-08-07: 500 mg via ORAL

## 2014-08-10 NOTE — Progress Notes (Signed)
08/07/2014 12:00 PM   Hailey [name] 08-21-1932 [id number]  Referring provider: Derinda Late, MD [phone] [address] [place], [place] [address]  Chief Complaint  Patient presents with  . Cysto    HPI: 81 year old female with recurrent urinary tract infections evaluated by my colleague Dr. Elnoria Howard. She was started on Macrobid suppressive antibiotics but has not been taking this medication due to some confusion. She was treated for an Escherichia coli urinary tract infection last month with the treatment course dose of antibiotics but then never resumed a suppressive Macrobid.  She also has a history of incomplete bladder emptying, PVR 200 and was advised to straight cath daily.  She returns to the office today for office cystoscopy to evaluate her bladder. She is convinced that she has bladder cancer or some other pathology.  She does complain of chronic lower abdominal pressure which she relates to her bladder.   PMH: Past Medical History  Diagnosis Date  . DVT (deep vein thrombosis) in pregnancy 09/2008    a. LLE, recurrent hx, has IVC filter. Not on coumadin now with history of GI bleeding  . Hyperlipidemia   . GERD (gastroesophageal reflux disease)   . Hypertrophic cardiomyopathy     a. Echo 4/09 with EF 70-75%, asymmetricy basal septal hypertrophy, mild MR without systolic anterior motion of the mitral valve, LVOT gradient to 130 mmHg with Valsalva b. Echo 7/13: EF 60-65%, mild focal basal septal hypertrophy, no significant LVOT gradient, no MV SAM c. Echo 2/15: EF 55-60%, HOCM, resting LVOT gradient 29 mmHg, Valsalva LVOT gradient > 140 mmHg, mild LVH, mild TR, elevated PASP  . Anemia     Chronic  . Osteoarthritis     Hx of left TKR  . Fibromyalgia   . Cerebral aneurysm     Hx of  . Migraine headache     Hx of  . Hypertension   . COPD (chronic obstructive pulmonary disease)   . PUD (peptic ulcer disease)     With GI bleeding  . History of colonoscopy   . PAF (paroxysmal  atrial fibrillation)     a. Full-dose ASA alone 2/2 h/o GIB.  Marland Kitchen OSA (obstructive sleep apnea)     a. Intolerant to CPAP, wears 2L via n/c  . Fibrocystic disease of breast   . Anxiety   . PAT (paroxysmal atrial tachycardia)   . Pneumonia   . 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 Asthma   . CHF (congestive heart failure)   . Arthritis   . Tremor   . Heart murmur   . Diverticulitis   . Pulmonary embolism   . Atrophic vaginitis   . Incomplete bladder emptying   . Gross hematuria   . Obesity   . Gout     Surgical History: Past Surgical History  Procedure Laterality Date  . Total knee arthroplasty      Left  . Bladder suspension    . Foot surgery      Bilateral  . Inguinal hernia repair  1968    Right  . Cerebral aneurysm repair  1998  . Abdominal hysterectomy    . Appendectomy    . Tonsillectomy and adenoidectomy    . Shoulder surgery  1972    Right   Left 2012  . Knee arthroscopy  2009    Right  . Cardiac catheterization  2009    No significant CAD  . Cardiovascular stress test      a.  Lexiscan Myoview 3/14: EF 76%, no evidence of ischemia or WMAs  . Cataract surgery      Home Medications:    Medication List       This list is accurate as of: 08/07/14 11:59 PM.  Always use your most recent med list.               amiodarone 200 MG tablet  Commonly known as:  PACERONE  Take 1 tablet (200 mg total) by mouth daily. Take an additional tablet if you have breakthrough arrhythmias.     dimenhyDRINATE 50 MG tablet  Commonly known as:  DRAMAMINE  Take by mouth.     docusate sodium 100 MG capsule  Commonly known as:  COLACE  Take 100 mg by mouth 2 (two) times daily as needed for mild constipation.     DULoxetine 30 MG capsule  Commonly known as:  CYMBALTA  Take 30 mg by mouth daily. Pt takes with a 60mg  capsule.     DULoxetine 60 MG capsule  Commonly known as:  CYMBALTA  Take 60 mg by mouth daily. Pt takes with a 30mg   capsule.     fluticasone 50 MCG/ACT nasal spray  Commonly known as:  FLONASE  Place 2 sprays into both nostrils daily as needed for rhinitis.     gabapentin 100 MG capsule  Commonly known as:  NEURONTIN  Take 200-400 mg by mouth 3 (three) times daily. Pt takes three in the morning, two in the evening, and four at bedtime.     loratadine 10 MG tablet  Commonly known as:  CLARITIN  Take 10 mg by mouth daily as needed for allergies.     losartan 50 MG tablet  Commonly known as:  COZAAR  Take 1 tablet (50 mg total) by mouth daily.     lovastatin 40 MG tablet  Commonly known as:  MEVACOR  Take 40 mg by mouth daily.     Melatonin 3 MG Tabs  Take by mouth.     metoprolol succinate 25 MG 24 hr tablet  Commonly known as:  TOPROL-XL  Take 25 mg by mouth daily.     pantoprazole 40 MG tablet  Commonly known as:  PROTONIX  Take 40 mg by mouth daily.     polyethylene glycol packet  Commonly known as:  MIRALAX / GLYCOLAX  Take by mouth.     potassium chloride 10 MEQ tablet  Commonly known as:  K-DUR  Take 10 mEq by mouth 2 (two) times daily.     pramipexole 0.25 MG tablet  Commonly known as:  MIRAPEX  Take 1 tablet by mouth at bedtime.     sucralfate 1 G tablet  Commonly known as:  CARAFATE  Take 1 g by mouth 2 (two) times daily before a meal.     tiotropium 18 MCG inhalation capsule  Commonly known as:  SPIRIVA  Place 18 mcg into inhaler and inhale daily.     torsemide 20 MG tablet  Commonly known as:  DEMADEX  Take 60 mg by mouth daily. Pt may take one additional tablet if she gains 2lbs in one night.     traMADol 50 MG tablet  Commonly known as:  ULTRAM  TAKE ONE TABLET BY MOUTH TWICE DAILY AS NEEDED FOR PAIN     triamcinolone ointment 0.1 %  Commonly known as:  KENALOG  Apply 1 application topically 2 (two) times daily as needed (for rash).     TYLENOL 500 MG tablet  Generic drug:  acetaminophen  Take by mouth.        Allergies:  Allergies  Allergen  Reactions  . Aspirin Other (See Comments)    Reaction:  Bleeding   . Codeine Nausea And Vomiting  . Ketorolac Tromethamine Other (See Comments)    Reaction:  Unknown   . Nsaids Other (See Comments)    Reaction:  Unknown   . Phenazopyridine Hcl Other (See Comments)    Reaction:  Vision problems     Family History: Family History  Problem Relation Age of Onset  . Pancreatic cancer Mother   . Colon cancer Father   . Colon polyps Father   . Heart disease Father   . Heart disease Sister     More than 1 sister  . Colon cancer Brother   . Colon polyps Other     Siblings  . Ulcers Brother   . Hypertension Mother     father  . Diabetes Mellitus II Mother     Sister  . Stroke Father     Social History:  reports that she quit smoking about 5 years ago. Her smoking use included Cigarettes. She has a 60 pack-year smoking history. She does not have any smokeless tobacco history on file. She reports that she drinks alcohol. She reports that she does not use illicit drugs.   Physical Exam: BP 126/60 mmHg  Pulse 63  Ht 5\' 1"  (1.549 m)  Wt 187 lb 9.6 oz (85.095 kg)  BMI 35.47 kg/m2  Constitutional:  Alert and oriented, No acute distress.  Presents today with her daughter.  HEENT: [place] AT, moist mucus membranes.  Trachea midline, no masses. Cardiovascular: No clubbing, cyanosis, or edema. Respiratory: Normal respiratory effort, no increased work of breathing. GI: Abdomen is soft, nontender, nondistended, no abdominal masses.  Bladder nonpalpable. GU: No CVA tenderness. Normal external genitalia. Patent urethral meatus. Skin: No rashes, bruises or suspicious lesions. Neurologic: Grossly intact, no focal deficits, moving all 4 extremities. Psychiatric: Normal mood and affect.  Laboratory Data: Lab Results  Component Value Date   WBC 5.5 07/30/2014   HGB 10.3* 07/30/2014   HCT 31.6* 07/30/2014   MCV 91.5 07/30/2014   PLT 234 07/30/2014    Lab Results  Component Value Date    CREATININE 0.96 07/30/2014    Urinalysis Results for orders placed or performed in visit on 08/07/14  Microscopic Examination  Result Value Ref Range   WBC, UA 11-30 (A) 0 -  5 /hpf   RBC, UA 0-2 0 -  2 /hpf   Epithelial Cells (non renal) 0-10 0 - 10 /hpf   Bacteria, UA Many (A) None seen/Few  Urinalysis, Complete  Result Value Ref Range   Specific Gravity, UA 1.010 1.005 - 1.030   pH, UA 5.5 5.0 - 7.5   Color, UA Yellow Yellow   Appearance Ur Hazy (A) Clear   Leukocytes, UA 1+ (A) Negative   Protein, UA Negative Negative/Trace   Glucose, UA Negative Negative   Ketones, UA Negative Negative   RBC, UA Negative Negative   Bilirubin, UA Negative Negative   Urobilinogen, Ur 0.2 0.2 - 1.0 mg/dL   Nitrite, UA Negative Negative   Microscopic Examination See below:     Pertinent Imaging: CLINICAL DATA: Blurry vision, nausea, vomiting, and dizziness for several hours. Generalized abdominal pain. Epigastric tenderness. Distention. (ED visit for epigastric pain/ dizziness)  EXAM: CT ABDOMEN AND PELVIS WITH CONTRAST   TECHNIQUE: Multidetector CT imaging of the abdomen and pelvis  was performed using the standard protocol following bolus administration of intravenous contrast.  CONTRAST: 144mL OMNIPAQUE IOHEXOL 300 MG/ML SOLN  COMPARISON: 07/04/2014  FINDINGS: Scarring and dependent atelectasis in the lung bases. Nodular opacity in the right lung base posteriorly is increased since previous study, likely representing focal atelectasis. Coronary artery and mitral valve annulus calcifications.  The liver, spleen, gallbladder, adrenal glands, and retroperitoneal lymph nodes are unremarkable. 8 mm cyst in the body of the pancreas again demonstrated without change. Inferior vena caval filter. Calcification of abdominal aorta without aneurysm. Circumscribed low-attenuation lesions in the kidneys likely representing cysts. No hydronephrosis. Stomach, small bowel, and  colon are not abnormally distended. Stool fills the colon. No free air or free fluid in the abdomen. Small umbilical hernia containing fat. Edema in the subcutaneous fat over the spinous processes of the lumbar spine.  Pelvis: Uterus is surgically absent. Bladder wall is not thickened. No free or loculated pelvic fluid collections. No pelvic mass or lymphadenopathy. Appendix is not identified. Stool-filled rectosigmoid colon without evidence of diverticulitis. Fatty atrophy versus lipoma is in the gluteus muscles bilaterally. Changes of pelvic floor descent with anterior rectocele. Spondylolysis with mild spondylolisthesis at L4-5. Degenerative changes in the spine. No destructive bone lesions.  IMPRESSION: Postoperative hysterectomy with pelvic floor to stent and anterior rectocele. No evidence of bowel obstruction or inflammation. Unchanged appearance of 8 mm cyst in the pancreas. Bilateral renal cysts. Inferior vena caval filter. Small umbilical hernia containing fat. Plexus and scarring in the lung bases.   Electronically Signed  By: Lucienne Capers M.D.  On: 07/31/2014 02:27    Cystoscopy Procedure Note  Patient identification was confirmed, informed consent was obtained, and patient was prepped using Betadine solution.  Lidocaine jelly was administered per urethral meatus.    Preoperative abx where received prior to procedure.    Procedure: - Flexible cystoscope introduced, without any difficulty.   - Thorough search of the bladder revealed:    normal urethral meatus    normal urothelium    no stones    no ulcers     no tumors    no urethral polyps    no trabeculation  - Ureteral orifices were normal in position and appearance.  Mild inflammation along the trigone consistent with benign trigonitis.  Post-Procedure: - Patient tolerated the procedure well  Assessment & Plan:  81 year old female with a history of recurrent urinary tract infections,  incomplete bladder emptying who presents today for office cystoscopy. Cystoscopy is unremarkable. Patient also had a recent CT scan performed during emergency room visit which showed no upper tract pathology/stones.  1. Recurrent UTI Recommend Macrobid daily as per Dr. Guinevere Ferrari instructions. Follow-up with Dr. Elnoria Howard in 3 months for further evaluation. - Urinalysis, Complete - lidocaine (XYLOCAINE) 2 % jelly 1 application; Place 1 application into the urethra once. - ciprofloxacin (CIPRO) tablet 500 mg; Take 1 tablet (500 mg total) by mouth once. - Microscopic Examination  2. Incomplete bladder emptying Continue daily self catheter given history of incomplete bladder emptying which is likely contributing to infections.  3. Atrophic vaginitis Previously prescribed Estrace cream for atrophic vaginitis. Recommend continuation of this medication as this could also help to reduce the number and frequency of infections.  Return in about 3 months (around 11/07/2014) for Dr. Elnoria Howard.  Hollice Espy, MD  [facility] [address], [place], [place] [address] [phone]

## 2014-08-14 DIAGNOSIS — Z79899 Other long term (current) drug therapy: Secondary | ICD-10-CM | POA: Diagnosis not present

## 2014-08-19 ENCOUNTER — Other Ambulatory Visit: Payer: Self-pay | Admitting: Urology

## 2014-09-05 ENCOUNTER — Other Ambulatory Visit: Payer: Self-pay | Admitting: Family Medicine

## 2014-09-05 ENCOUNTER — Other Ambulatory Visit: Payer: Commercial Managed Care - HMO

## 2014-09-05 DIAGNOSIS — R300 Dysuria: Secondary | ICD-10-CM

## 2014-09-05 DIAGNOSIS — N390 Urinary tract infection, site not specified: Secondary | ICD-10-CM

## 2014-09-09 ENCOUNTER — Ambulatory Visit: Payer: Self-pay | Admitting: Urology

## 2014-09-22 DIAGNOSIS — G894 Chronic pain syndrome: Secondary | ICD-10-CM | POA: Diagnosis not present

## 2014-09-22 DIAGNOSIS — I10 Essential (primary) hypertension: Secondary | ICD-10-CM | POA: Diagnosis not present

## 2014-09-22 DIAGNOSIS — I509 Heart failure, unspecified: Secondary | ICD-10-CM | POA: Diagnosis not present

## 2014-09-22 DIAGNOSIS — J449 Chronic obstructive pulmonary disease, unspecified: Secondary | ICD-10-CM | POA: Diagnosis not present

## 2014-09-27 ENCOUNTER — Other Ambulatory Visit: Payer: Self-pay | Admitting: Cardiovascular Disease

## 2014-10-07 ENCOUNTER — Other Ambulatory Visit: Payer: Self-pay | Admitting: Cardiovascular Disease

## 2014-10-07 ENCOUNTER — Telehealth: Payer: Self-pay

## 2014-10-07 DIAGNOSIS — N390 Urinary tract infection, site not specified: Secondary | ICD-10-CM

## 2014-10-07 NOTE — Telephone Encounter (Signed)
Her chart indicates that she should be on suppressive antibiotic by Dr. Elnoria Howard.  Has she not been taking the Macrobid once daily?

## 2014-10-07 NOTE — Telephone Encounter (Signed)
Pt pharmacy sent a refill request stating pt has called them c/o the same symptoms she previously had and is wanting another script for nitrofurantoin. Please advise.

## 2014-10-08 NOTE — Telephone Encounter (Signed)
LMOM

## 2014-10-09 MED ORDER — NITROFURANTOIN MONOHYD MACRO 100 MG PO CAPS: 100.0000 mg | ORAL_CAPSULE | Freq: Every day | ORAL | Status: DC

## 2014-10-09 NOTE — Telephone Encounter (Signed)
Pt states it has been 2 days since taking a macrobid tablet.

## 2014-10-09 NOTE — Telephone Encounter (Signed)
She needs to take the Macrobid once daily until her appointment in October.  We can refill the medication, so she can make it to her appointment.

## 2014-10-09 NOTE — Telephone Encounter (Signed)
Medication has been ordered and pt made aware.

## 2014-10-16 DIAGNOSIS — R21 Rash and other nonspecific skin eruption: Secondary | ICD-10-CM | POA: Diagnosis not present

## 2014-10-16 DIAGNOSIS — G894 Chronic pain syndrome: Secondary | ICD-10-CM | POA: Diagnosis not present

## 2014-10-16 DIAGNOSIS — J449 Chronic obstructive pulmonary disease, unspecified: Secondary | ICD-10-CM | POA: Diagnosis not present

## 2014-10-16 DIAGNOSIS — I509 Heart failure, unspecified: Secondary | ICD-10-CM | POA: Diagnosis not present

## 2014-11-05 ENCOUNTER — Other Ambulatory Visit: Payer: Self-pay | Admitting: Cardiovascular Disease

## 2014-11-07 DIAGNOSIS — Z961 Presence of intraocular lens: Secondary | ICD-10-CM | POA: Diagnosis not present

## 2014-11-07 DIAGNOSIS — H47233 Glaucomatous optic atrophy, bilateral: Secondary | ICD-10-CM | POA: Diagnosis not present

## 2014-11-21 DIAGNOSIS — J449 Chronic obstructive pulmonary disease, unspecified: Secondary | ICD-10-CM | POA: Diagnosis not present

## 2014-11-21 DIAGNOSIS — M6208 Separation of muscle (nontraumatic), other site: Secondary | ICD-10-CM | POA: Diagnosis not present

## 2014-11-21 DIAGNOSIS — G894 Chronic pain syndrome: Secondary | ICD-10-CM | POA: Diagnosis not present

## 2014-11-21 DIAGNOSIS — Z79899 Other long term (current) drug therapy: Secondary | ICD-10-CM | POA: Diagnosis not present

## 2014-11-21 DIAGNOSIS — I5032 Chronic diastolic (congestive) heart failure: Secondary | ICD-10-CM | POA: Diagnosis not present

## 2014-12-05 ENCOUNTER — Other Ambulatory Visit: Payer: Self-pay | Admitting: Urology

## 2014-12-08 DIAGNOSIS — R0689 Other abnormalities of breathing: Secondary | ICD-10-CM | POA: Diagnosis not present

## 2014-12-08 DIAGNOSIS — J449 Chronic obstructive pulmonary disease, unspecified: Secondary | ICD-10-CM | POA: Diagnosis not present

## 2014-12-08 DIAGNOSIS — M5136 Other intervertebral disc degeneration, lumbar region: Secondary | ICD-10-CM | POA: Diagnosis not present

## 2014-12-08 DIAGNOSIS — Z96659 Presence of unspecified artificial knee joint: Secondary | ICD-10-CM | POA: Diagnosis not present

## 2014-12-17 ENCOUNTER — Encounter: Payer: Self-pay | Admitting: Internal Medicine

## 2014-12-25 DIAGNOSIS — J441 Chronic obstructive pulmonary disease with (acute) exacerbation: Secondary | ICD-10-CM | POA: Diagnosis not present

## 2015-01-08 DIAGNOSIS — M5136 Other intervertebral disc degeneration, lumbar region: Secondary | ICD-10-CM | POA: Diagnosis not present

## 2015-01-08 DIAGNOSIS — J449 Chronic obstructive pulmonary disease, unspecified: Secondary | ICD-10-CM | POA: Diagnosis not present

## 2015-01-08 DIAGNOSIS — R0689 Other abnormalities of breathing: Secondary | ICD-10-CM | POA: Diagnosis not present

## 2015-01-08 DIAGNOSIS — Z96659 Presence of unspecified artificial knee joint: Secondary | ICD-10-CM | POA: Diagnosis not present

## 2015-01-09 ENCOUNTER — Other Ambulatory Visit: Payer: Self-pay | Admitting: Urology

## 2015-01-09 ENCOUNTER — Ambulatory Visit: Payer: Self-pay | Admitting: Urology

## 2015-01-13 ENCOUNTER — Emergency Department: Payer: Commercial Managed Care - HMO

## 2015-01-13 ENCOUNTER — Encounter: Payer: Self-pay | Admitting: Emergency Medicine

## 2015-01-13 ENCOUNTER — Emergency Department
Admission: EM | Admit: 2015-01-13 | Discharge: 2015-01-13 | Disposition: A | Discharge status: Home / Self Care | Payer: Commercial Managed Care - HMO | Attending: Emergency Medicine | Admitting: Emergency Medicine

## 2015-01-13 DIAGNOSIS — S299XXA Unspecified injury of thorax, initial encounter: Secondary | ICD-10-CM | POA: Diagnosis present

## 2015-01-13 DIAGNOSIS — Y9289 Other specified places as the place of occurrence of the external cause: Secondary | ICD-10-CM | POA: Insufficient documentation

## 2015-01-13 DIAGNOSIS — S22088A Other fracture of T11-T12 vertebra, initial encounter for closed fracture: Secondary | ICD-10-CM | POA: Insufficient documentation

## 2015-01-13 DIAGNOSIS — Y9389 Activity, other specified: Secondary | ICD-10-CM | POA: Diagnosis not present

## 2015-01-13 DIAGNOSIS — S22080A Wedge compression fracture of T11-T12 vertebra, initial encounter for closed fracture: Secondary | ICD-10-CM | POA: Diagnosis not present

## 2015-01-13 DIAGNOSIS — Y998 Other external cause status: Secondary | ICD-10-CM | POA: Insufficient documentation

## 2015-01-13 DIAGNOSIS — Z792 Long term (current) use of antibiotics: Secondary | ICD-10-CM | POA: Insufficient documentation

## 2015-01-13 DIAGNOSIS — Z7951 Long term (current) use of inhaled steroids: Secondary | ICD-10-CM | POA: Diagnosis not present

## 2015-01-13 DIAGNOSIS — W1839XA Other fall on same level, initial encounter: Secondary | ICD-10-CM | POA: Insufficient documentation

## 2015-01-13 DIAGNOSIS — Z87891 Personal history of nicotine dependence: Secondary | ICD-10-CM | POA: Diagnosis not present

## 2015-01-13 DIAGNOSIS — Z79899 Other long term (current) drug therapy: Secondary | ICD-10-CM | POA: Insufficient documentation

## 2015-01-13 DIAGNOSIS — R05 Cough: Secondary | ICD-10-CM | POA: Diagnosis not present

## 2015-01-13 DIAGNOSIS — I10 Essential (primary) hypertension: Secondary | ICD-10-CM | POA: Insufficient documentation

## 2015-01-13 DIAGNOSIS — IMO0002 Reserved for concepts with insufficient information to code with codable children: Secondary | ICD-10-CM

## 2015-01-13 MED ORDER — OXYCODONE-ACETAMINOPHEN 5-325 MG PO TABS: 1.0000 | ORAL_TABLET | Freq: Once | ORAL | Status: DC

## 2015-01-13 MED ORDER — TRAMADOL HCL 50 MG PO TABS
ORAL_TABLET | ORAL | Status: AC
  Filled 2015-01-13: qty 1

## 2015-01-13 MED ORDER — TRAMADOL HCL 50 MG PO TABS
50.0000 mg | ORAL_TABLET | Freq: Once | ORAL | Status: AC
Start: 2015-01-13 — End: 2015-01-13
  Administered 2015-01-13: 50 mg via ORAL

## 2015-01-13 MED ORDER — TRAMADOL HCL 50 MG PO TABS: 50.0000 mg | ORAL_TABLET | Freq: Two times a day (BID) | ORAL | Status: DC | PRN

## 2015-01-13 NOTE — ED Provider Notes (Addendum)
[facility] Emergency Department Provider Note  ____________________________________________   I have reviewed the triage vital signs and the nursing notes.   HISTORY  Chief Complaint Fall    HPI Hailey [name] is a 81 y.o. female with multiple medical problems including a history of dependence on narcotics in the past as well as mild dementia and fibromyalgia in addition to peptic ulcer disease which causes her not to be able take NSAIDs, and history of frequent falls who uses a walker fell one week ago. It was a non-syncopal fall. She remembers falling she did not pass out. She landed directly on her bottom. She had no neck pain or headache, no nausea no vomiting. There is no antecedent or subsequent chest pain or shortness of breath. She has had an occasional slight cough. She denies any leg swelling, she denies any numbness or weakness she denies any incontinence of bowel or bladder but she is complaining of low back pain since the fall. She likes something started and Tylenol. Her sister, who is in the room with her, tells [place] we should not give her anything that could "Causey dependence". The patient has had no melena or bright red blood per rectum.  Past Medical History  Diagnosis Date  . DVT (deep vein thrombosis) in pregnancy 09/2008    a. LLE, recurrent hx, has IVC filter. Not on coumadin now with history of GI bleeding  . Hyperlipidemia   . GERD (gastroesophageal reflux disease)   . Hypertrophic cardiomyopathy ([place])     a. Echo 4/09 with EF 70-75%, asymmetricy basal septal hypertrophy, mild MR without systolic anterior motion of the mitral valve, LVOT gradient to 130 mmHg with Valsalva b. Echo 7/13: EF 60-65%, mild focal basal septal hypertrophy, no significant LVOT gradient, no MV SAM c. Echo 2/15: EF 55-60%, HOCM, resting LVOT gradient 29 mmHg, Valsalva LVOT gradient > 140 mmHg, mild LVH, mild TR, elevated PASP  . Anemia     Chronic  . Osteoarthritis     Hx  of left TKR  . Fibromyalgia   . Cerebral aneurysm     Hx of  . Migraine headache     Hx of  . Hypertension   . COPD (chronic obstructive pulmonary disease) ([place])   . PUD (peptic ulcer disease)     With GI bleeding  . History of colonoscopy   . PAF (paroxysmal atrial fibrillation) ([place])     a. Full-dose ASA alone 2/2 h/o GIB.  Marland Kitchen OSA (obstructive sleep apnea)     a. Intolerant to CPAP, wears 2L via n/c  . Fibrocystic disease of breast   . Anxiety   . PAT (paroxysmal atrial tachycardia) ([place])   . Pneumonia   . 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 Asthma   . CHF (congestive heart failure) ([place])   . Arthritis   . Tremor   . Heart murmur   . Diverticulitis   . Pulmonary embolism ([place])   . Atrophic vaginitis   . Incomplete bladder emptying   . Gross hematuria   . Obesity   . Gout     Patient Active Problem List   Diagnosis Date Noted  . Abdomen enlarged 09/26/2013  . CTCL (cutaneous T-cell lymphoma) ([place]) 09/18/2013  . A-fib ([place]) 08/09/2013  . Anxiety 08/09/2013  . CCF (congestive cardiac failure) ([place]) 08/09/2013  . Chronic pain 08/09/2013  . PAF (paroxysmal atrial fibrillation) ([place])   . Chronic diastolic CHF (congestive heart failure) (  [place]) 07/12/2013  . Benign essential HTN 07/01/2013  . Bilateral leg edema 06/06/2013  . Paroxysmal atrial fibrillation ([place]) 04/25/2013  . DOE (dyspnea on exertion) 10/11/2011  . COPD (chronic obstructive pulmonary disease) ([place]) 10/09/2011  . Cognitive decline 10/09/2011  . Behavior concern 08/23/2011  . Chest pain 10/26/2010  . Palpitations 10/26/2010  . PHLEBITIS AND THROMBOPHLEBITIS OF FEMORAL VEIN 10/13/2008  . DEEP VENOUS THROMBOPHLEBITIS, LEG, LEFT 10/09/2008  . GASTRIC ULCER, ACUTE 10/09/2008  . PERSONAL HX COLONIC POLYPS 09/02/2008  . FATIGUE 08/27/2008  . Hypertrophic obstructive cardiomyopathy(425.11) 11/06/2007  . HYPOTENSION, ORTHOSTATIC 11/06/2007  . ADENOMATOUS COLONIC POLYP  04/12/2007  . HIATAL HERNIA 04/12/2007  . Diverticulosis of colon (without mention of hemorrhage) 04/12/2007  . Hyperlipidemia 02/03/2007  . ANEMIA, CHRONIC 02/03/2007  . MIGRAINE HEADACHE 02/03/2007  . Essential hypertension 02/03/2007  . CEREBRAL ANEURYSM 02/03/2007  . GASTROESOPHAGEAL REFLUX DISEASE 02/03/2007  . OSTEOARTHRITIS 02/03/2007  . FIBROMYALGIA 02/03/2007  . CHRONIC FATIGUE SYNDROME 02/03/2007  . MITRAL VALVE PROLAPSE, HX OF 02/03/2007  . PULMONARY EMBOLISM, HX OF 02/03/2007  . TOTAL KNEE REPLACEMENT, LEFT, HX OF 02/03/2007  . HYSTERECTOMY, HX OF 02/03/2007  . Other acquired absence of organ 02/03/2007  . INGUINAL HERNIORRHAPHY, RIGHT, HX OF 02/03/2007    Past Surgical History  Procedure Laterality Date  . Total knee arthroplasty      Left  . Bladder suspension    . Foot surgery      Bilateral  . Inguinal hernia repair  1968    Right  . Cerebral aneurysm repair  1998  . Abdominal hysterectomy    . Appendectomy    . Tonsillectomy and adenoidectomy    . Shoulder surgery  1972    Right   Left 2012  . Knee arthroscopy  2009    Right  . Cardiac catheterization  2009    No significant CAD  . Cardiovascular stress test      a. Lexiscan Myoview 3/14: EF 76%, no evidence of ischemia or WMAs  . Cataract surgery      Current Outpatient Rx  Name  Route  Sig  Dispense  Refill  . acetaminophen (TYLENOL) 500 MG tablet   Oral   Take by mouth.         Marland Kitchen amiodarone (PACERONE) 200 MG tablet   Oral   Take 1 tablet (200 mg total) by mouth daily. Take an additional tablet if you have breakthrough arrhythmias. Patient taking differently: Take 200 mg by mouth every 6 (six) hours.    60 tablet   3   . dimenhyDRINATE (DRAMAMINE) 50 MG tablet   Oral   Take by mouth.         . docusate sodium (COLACE) 100 MG capsule   Oral   Take 100 mg by mouth 2 (two) times daily as needed for mild constipation.         . DULoxetine (CYMBALTA) 30 MG capsule   Oral   Take 30  mg by mouth daily. Pt takes with a 60mg  capsule.         . DULoxetine (CYMBALTA) 60 MG capsule   Oral   Take 60 mg by mouth daily. Pt takes with a 30mg  capsule.         . fluticasone (FLONASE) 50 MCG/ACT nasal spray   Each Nare   Place 2 sprays into both nostrils daily as needed for rhinitis.         Marland Kitchen gabapentin (NEURONTIN) 100 MG capsule   Oral  Take 200-400 mg by mouth 3 (three) times daily. Pt takes three in the morning, two in the evening, and four at bedtime.         Marland Kitchen loratadine (CLARITIN) 10 MG tablet   Oral   Take 10 mg by mouth daily as needed for allergies.          Marland Kitchen losartan (COZAAR) 50 MG tablet   Oral   Take 0.5 tablets (25 mg total) by mouth daily.   30 tablet   3     FOR NEXT FILL. THANK YOU   . lovastatin (MEVACOR) 40 MG tablet   Oral   Take 40 mg by mouth daily.           . Melatonin 3 MG TABS   Oral   Take by mouth.         . metoprolol succinate (TOPROL-XL) 25 MG 24 hr tablet   Oral   Take 25 mg by mouth daily.          . nitrofurantoin, macrocrystal-monohydrate, (MACROBID) 100 MG capsule   Oral   Take 1 capsule (100 mg total) by mouth at bedtime.   90 capsule   0   . pantoprazole (PROTONIX) 40 MG tablet   Oral   Take 40 mg by mouth daily.         . polyethylene glycol (MIRALAX / GLYCOLAX) packet   Oral   Take by mouth.         . potassium chloride (K-DUR) 10 MEQ tablet   Oral   Take 10 mEq by mouth 2 (two) times daily.         . potassium chloride (K-DUR) 10 MEQ tablet      TAKE TWO TABLETS BY MOUTH TWICE DAILY   120 tablet   3     FOR NEXT FILLTHANK YOU   . pramipexole (MIRAPEX) 0.25 MG tablet   Oral   Take 1 tablet by mouth at bedtime.          . sucralfate (CARAFATE) 1 G tablet   Oral   Take 1 g by mouth 2 (two) times daily before a meal.          . tiotropium (SPIRIVA) 18 MCG inhalation capsule   Inhalation   Place 18 mcg into inhaler and inhale daily.         Marland Kitchen torsemide (DEMADEX) 20 MG  tablet   Oral   Take 60 mg by mouth daily. Pt may take one additional tablet if she gains 2lbs in one night.         . traMADol (ULTRAM) 50 MG tablet      TAKE ONE TABLET BY MOUTH TWICE DAILY AS NEEDED FOR PAIN         . triamcinolone ointment (KENALOG) 0.1 %   Topical   Apply 1 application topically 2 (two) times daily as needed (for rash).            Allergies Aspirin; Codeine; Ketorolac tromethamine; Nsaids; and Phenazopyridine hcl  Family History  Problem Relation Age of Onset  . Pancreatic cancer Mother   . Colon cancer Father   . Colon polyps Father   . Heart disease Father   . Heart disease Sister     More than 1 sister  . Colon cancer Brother   . Colon polyps Other     Siblings  . Ulcers Brother   . Hypertension Mother     father  . Diabetes Mellitus II  Mother     Sister  . Stroke Father     Social History Social History  Substance Use Topics  . Smoking status: Former Smoker -- 1.00 packs/day for 60 years    Types: Cigarettes    Quit date: 12/15/2008  . Smokeless tobacco: None     Comment: smoked for 50 years quit 5 + years  . Alcohol Use: 0.0 oz/week    0 Standard drinks or equivalent per week     Comment: Socially    Review of Systems Constitutional: No fever/chills Eyes: No visual changes. ENT: No sore throat. No stiff neck no neck pain Cardiovascular: Denies chest pain. Respiratory: Denies shortness of breath.Occasional nonproductive cough  Gastrointestinal:   no vomiting.  No diarrhea.  No constipation. Genitourinary: Negative for dysuria. Musculoskeletal: Negative lower extremity swelling Skin: Negative for rash. Neurological: Negative for headaches, focal weakness or numbness. 10-point ROS otherwise negative.  ____________________________________________   PHYSICAL EXAM:  VITAL SIGNS: ED Triage Vitals  Enc Vitals Group     BP 01/13/15 1247 137/78 mmHg     Pulse Rate 01/13/15 1247 60     Resp 01/13/15 1247 18     Temp  01/13/15 1247 98 F (36.7 C)     Temp Source 01/13/15 1247 Oral     SpO2 01/13/15 1247 97 %     Weight 01/13/15 1247 184 lb (83.462 kg)     Height 01/13/15 1247 5\' 1"  (1.549 m)     Head Cir --      Peak Flow --      Pain Score 01/13/15 1248 9     Pain Loc --      Pain Edu? --      Excl. in [place]? --     Constitutional: Alert and oriented. Well appearing and in no acute distress. Eyes: Conjunctivae are normal. PERRL. EOMI. Head: Atraumatic. Nose: No congestion/rhinnorhea. Mouth/Throat: Mucous membranes are moist.  Oropharynx non-erythematous. Neck: No stridor.   Nontender with no meningismus Cardiovascular: Normal rate, regular rhythm. Grossly normal heart sounds.  Good peripheral circulation. Respiratory: Normal respiratory effort.  No retractions. Lungs CTAB. Abdominal: Soft and nontender. No distention. No guarding no rebound Back: There is minimal tenderness to palpation in the paraspinal muscles of the left lower back and a little bit of midline tenderness around L1 or T12.  there are no lesions noted. there is no CVA tenderness Musculoskeletal: No lower extremity tenderness. No joint effusions, no DVT signs strong distal pulses no edema Neurologic:  Normal speech and language. No gross focal neurologic deficits are appreciated. No saddle anesthesia, strength and reflexes are intact lower legs.  Skin:  Skin is warm, dry and intact. No rash noted. Psychiatric: Mood and affect are normal. Speech and behavior are normal.  ____________________________________________   LABS (all labs ordered are listed, but only abnormal results are displayed)  Labs Reviewed - No data to display ____________________________________________  EKG  I personally interpreted any EKGs ordered by me or triage  ____________________________________________  RADIOLOGY  I reviewed any imaging ordered by me or triage that were performed during my  shift ____________________________________________   PROCEDURES  Procedure(s) performed: None  Critical Care performed: None  ____________________________________________   INITIAL IMPRESSION / ASSESSMENT AND PLAN / ED COURSE  Pertinent labs & imaging results that were available during my care of the patient were reviewed by me and considered in my medical decision making (see chart for details).  Patient with a nonstick will follow with pain since the  fall 1 week very reproducible, x-ray shows a new compression fracture which does explain her symptoms. There is no evidence of acute neurologic decompensation. There is no evidence of pneumonia. She has no urinary symptoms. Given her intolerance of nonsteroidal pain medication, as well as her history of addictive issues with narcotic pain medication, this is a difficult patient to treat. I certainly do not wish to give her medication because her a GI bleed. I have extensively discussed this with the patient and her sister. I'll try tramadol, as hopefully a medication that can control her discomfort from this compression fracture until she can do kyphoplasty. I have explained to her that there is a risk to taking the medication however and she understands this. The patient did not hit her head or lose consciousness, she was sitting up when this happened and there is no evidence of head trauma, this happened 6 days ago, I do not think CT of the head or neck is warranted at this time. Return precautions and follow-up even and understood. Patient is also to follow closely with her primary care doctor for further evaluation of her propensity to suffer non-syncopal falls. ____________________________________________   FINAL CLINICAL IMPRESSION(S) / ED DIAGNOSES  Final diagnoses:  None     Schuyler Amor, MD 01/13/15 [place], MD 01/13/15 [phone]

## 2015-01-13 NOTE — ED Notes (Signed)
Pt reports low back pain.  Pt had a fall 6 days ago and landed on buttocks.  Pt continues to have low back pain.  States recent falls x 5.  Pt states she keeps losing balance. Pt ambulates with cane and walker.  Pt alert.  Speech clear.    Family with pt.

## 2015-01-13 NOTE — ED Notes (Signed)
Patient transported to X-ray 

## 2015-01-13 NOTE — ED Notes (Signed)
Fell on Wednesday from standing position to interior floor.  C/o low back. Side pain, neck pain, and leg pain.  Describes pain as "one big ache".

## 2015-01-15 DIAGNOSIS — M5136 Other intervertebral disc degeneration, lumbar region: Secondary | ICD-10-CM | POA: Diagnosis not present

## 2015-01-15 DIAGNOSIS — S22080A Wedge compression fracture of T11-T12 vertebra, initial encounter for closed fracture: Secondary | ICD-10-CM | POA: Diagnosis not present

## 2015-01-17 ENCOUNTER — Other Ambulatory Visit: Payer: Self-pay | Admitting: Cardiovascular Disease

## 2015-01-27 ENCOUNTER — Emergency Department: Payer: Commercial Managed Care - HMO

## 2015-01-27 ENCOUNTER — Encounter: Payer: Self-pay | Admitting: Emergency Medicine

## 2015-01-27 ENCOUNTER — Observation Stay
Admission: EM | Admit: 2015-01-27 | Discharge: 2015-01-28 | Disposition: A | Discharge status: Home / Self Care | Payer: Commercial Managed Care - HMO | Attending: Internal Medicine | Admitting: Internal Medicine

## 2015-01-27 DIAGNOSIS — N390 Urinary tract infection, site not specified: Secondary | ICD-10-CM | POA: Diagnosis not present

## 2015-01-27 DIAGNOSIS — G3184 Mild cognitive impairment, so stated: Secondary | ICD-10-CM | POA: Insufficient documentation

## 2015-01-27 DIAGNOSIS — Z9981 Dependence on supplemental oxygen: Secondary | ICD-10-CM | POA: Diagnosis not present

## 2015-01-27 DIAGNOSIS — Z7951 Long term (current) use of inhaled steroids: Secondary | ICD-10-CM | POA: Insufficient documentation

## 2015-01-27 DIAGNOSIS — F419 Anxiety disorder, unspecified: Secondary | ICD-10-CM | POA: Diagnosis not present

## 2015-01-27 DIAGNOSIS — Z9889 Other specified postprocedural states: Secondary | ICD-10-CM | POA: Insufficient documentation

## 2015-01-27 DIAGNOSIS — J449 Chronic obstructive pulmonary disease, unspecified: Secondary | ICD-10-CM | POA: Diagnosis not present

## 2015-01-27 DIAGNOSIS — F0390 Unspecified dementia without behavioral disturbance: Secondary | ICD-10-CM | POA: Insufficient documentation

## 2015-01-27 DIAGNOSIS — E785 Hyperlipidemia, unspecified: Secondary | ICD-10-CM | POA: Diagnosis not present

## 2015-01-27 DIAGNOSIS — R110 Nausea: Secondary | ICD-10-CM | POA: Insufficient documentation

## 2015-01-27 DIAGNOSIS — S32009A Unspecified fracture of unspecified lumbar vertebra, initial encounter for closed fracture: Secondary | ICD-10-CM | POA: Diagnosis not present

## 2015-01-27 DIAGNOSIS — J9610 Chronic respiratory failure, unspecified whether with hypoxia or hypercapnia: Secondary | ICD-10-CM | POA: Insufficient documentation

## 2015-01-27 DIAGNOSIS — Z87440 Personal history of urinary (tract) infections: Secondary | ICD-10-CM | POA: Insufficient documentation

## 2015-01-27 DIAGNOSIS — Z79899 Other long term (current) drug therapy: Secondary | ICD-10-CM | POA: Insufficient documentation

## 2015-01-27 DIAGNOSIS — Z7982 Long term (current) use of aspirin: Secondary | ICD-10-CM | POA: Diagnosis not present

## 2015-01-27 DIAGNOSIS — W19XXXA Unspecified fall, initial encounter: Secondary | ICD-10-CM | POA: Insufficient documentation

## 2015-01-27 DIAGNOSIS — I422 Other hypertrophic cardiomyopathy: Secondary | ICD-10-CM | POA: Insufficient documentation

## 2015-01-27 DIAGNOSIS — Z86718 Personal history of other venous thrombosis and embolism: Secondary | ICD-10-CM | POA: Diagnosis not present

## 2015-01-27 DIAGNOSIS — Z6834 Body mass index (BMI) 34.0-34.9, adult: Secondary | ICD-10-CM | POA: Insufficient documentation

## 2015-01-27 DIAGNOSIS — I509 Heart failure, unspecified: Secondary | ICD-10-CM | POA: Diagnosis not present

## 2015-01-27 DIAGNOSIS — I480 Paroxysmal atrial fibrillation: Secondary | ICD-10-CM | POA: Diagnosis not present

## 2015-01-27 DIAGNOSIS — R0602 Shortness of breath: Secondary | ICD-10-CM | POA: Diagnosis not present

## 2015-01-27 DIAGNOSIS — E669 Obesity, unspecified: Secondary | ICD-10-CM | POA: Insufficient documentation

## 2015-01-27 DIAGNOSIS — R4182 Altered mental status, unspecified: Secondary | ICD-10-CM | POA: Insufficient documentation

## 2015-01-27 DIAGNOSIS — Z87891 Personal history of nicotine dependence: Secondary | ICD-10-CM | POA: Diagnosis not present

## 2015-01-27 DIAGNOSIS — Z86711 Personal history of pulmonary embolism: Secondary | ICD-10-CM | POA: Insufficient documentation

## 2015-01-27 DIAGNOSIS — R339 Retention of urine, unspecified: Secondary | ICD-10-CM | POA: Insufficient documentation

## 2015-01-27 DIAGNOSIS — R531 Weakness: Secondary | ICD-10-CM | POA: Diagnosis not present

## 2015-01-27 DIAGNOSIS — I10 Essential (primary) hypertension: Secondary | ICD-10-CM | POA: Diagnosis not present

## 2015-01-27 DIAGNOSIS — M1990 Unspecified osteoarthritis, unspecified site: Secondary | ICD-10-CM | POA: Insufficient documentation

## 2015-01-27 DIAGNOSIS — G4733 Obstructive sleep apnea (adult) (pediatric): Secondary | ICD-10-CM | POA: Diagnosis not present

## 2015-01-27 DIAGNOSIS — I110 Hypertensive heart disease with heart failure: Secondary | ICD-10-CM | POA: Insufficient documentation

## 2015-01-27 DIAGNOSIS — M797 Fibromyalgia: Secondary | ICD-10-CM | POA: Diagnosis not present

## 2015-01-27 DIAGNOSIS — K219 Gastro-esophageal reflux disease without esophagitis: Secondary | ICD-10-CM | POA: Diagnosis not present

## 2015-01-27 DIAGNOSIS — M109 Gout, unspecified: Secondary | ICD-10-CM | POA: Diagnosis not present

## 2015-01-27 DIAGNOSIS — J45909 Unspecified asthma, uncomplicated: Secondary | ICD-10-CM | POA: Diagnosis not present

## 2015-01-27 LAB — BASIC METABOLIC PANEL
ANION GAP: 12 (ref 5–15)
BUN: 9 mg/dL (ref 6–20)
CHLORIDE: 97 mmol/L — AB (ref 101–111)
CO2: 25 mmol/L (ref 22–32)
Calcium: 9.2 mg/dL (ref 8.9–10.3)
Creatinine, Ser: 0.62 mg/dL (ref 0.44–1.00)
GFR calc Af Amer: >60 mL/min (ref >60)
GLUCOSE: 99 mg/dL (ref 65–99)
POTASSIUM: 3.4 mmol/L — AB (ref 3.5–5.1)
Sodium: 134 mmol/L — ABNORMAL LOW (ref 135–145)

## 2015-01-27 LAB — BLOOD GAS, ARTERIAL
ACID-BASE EXCESS: 2.1 mmol/L (ref 0.0–3.0)
Allens test (pass/fail): POSITIVE — AB
Bicarbonate: 27.8 mEq/L (ref 21.0–28.0)
FIO2: 28
O2 Saturation: 92.6 %
PCO2 ART: 47 mmHg (ref 32.0–48.0)
PH ART: 7.38 (ref 7.350–7.450)
Patient temperature: 37
pO2, Arterial: 67 mmHg — ABNORMAL LOW (ref 83.0–108.0)

## 2015-01-27 LAB — BRAIN NATRIURETIC PEPTIDE: B Natriuretic Peptide: 28 pg/mL (ref 0.0–100.0)

## 2015-01-27 LAB — CBC
HEMATOCRIT: 33.4 % — AB (ref 35.0–47.0)
HEMOGLOBIN: 11.6 g/dL — AB (ref 12.0–16.0)
MCH: 32.1 pg (ref 26.0–34.0)
MCHC: 34.8 g/dL (ref 32.0–36.0)
MCV: 92.3 fL (ref 80.0–100.0)
Platelets: 327 10*3/uL (ref 150–440)
RBC: 3.62 MIL/uL — ABNORMAL LOW (ref 3.80–5.20)
RDW: 14.7 % — ABNORMAL HIGH (ref 11.5–14.5)
WBC: 5.4 10*3/uL (ref 3.6–11.0)

## 2015-01-27 LAB — URINALYSIS COMPLETE WITH MICROSCOPIC (ARMC ONLY)
Bacteria, UA: NONE SEEN
Bilirubin Urine: NEGATIVE
GLUCOSE, UA: NEGATIVE mg/dL
Hgb urine dipstick: NEGATIVE
Leukocytes, UA: NEGATIVE
Nitrite: NEGATIVE
PROTEIN: NEGATIVE mg/dL
RBC / HPF: NONE SEEN RBC/hpf (ref 0–5)
Specific Gravity, Urine: 1.017 (ref 1.005–1.030)
pH: 5 (ref 5.0–8.0)

## 2015-01-27 LAB — HEPATIC FUNCTION PANEL
ALT: 14 U/L (ref 14–54)
AST: 25 U/L (ref 15–41)
Albumin: 4.3 g/dL (ref 3.5–5.0)
Alkaline Phosphatase: 133 U/L — ABNORMAL HIGH (ref 38–126)
Bilirubin, Direct: 0.1 mg/dL (ref 0.1–0.5)
Indirect Bilirubin: 0.6 mg/dL (ref 0.3–0.9)
TOTAL PROTEIN: 7.1 g/dL (ref 6.5–8.1)
Total Bilirubin: 0.7 mg/dL (ref 0.3–1.2)

## 2015-01-27 LAB — TROPONIN I

## 2015-01-27 LAB — TSH: TSH: 37.387 u[IU]/mL — AB (ref 0.350–4.500)

## 2015-01-27 LAB — AMMONIA: Ammonia: 26 umol/L (ref 9–35)

## 2015-01-27 MED ORDER — PRAVASTATIN SODIUM 20 MG PO TABS: 40.0000 mg | ORAL_TABLET | Freq: Every day | ORAL | Status: DC

## 2015-01-27 MED ORDER — ACETAMINOPHEN 650 MG RE SUPP: 650.0000 mg | Freq: Four times a day (QID) | RECTAL | Status: DC | PRN

## 2015-01-27 MED ORDER — ONDANSETRON HCL 4 MG/2ML IJ SOLN: 4.0000 mg | Freq: Four times a day (QID) | INTRAMUSCULAR | Status: DC | PRN

## 2015-01-27 MED ORDER — PANTOPRAZOLE SODIUM 40 MG PO TBEC
40.0000 mg | DELAYED_RELEASE_TABLET | Freq: Every day | ORAL | Status: DC
  Administered 2015-01-27 – 2015-01-28 (×2): 40 mg via ORAL
  Filled 2015-01-27 (×2): qty 1

## 2015-01-27 MED ORDER — FLEET ENEMA 7-19 GM/118ML RE ENEM: 1.0000 | ENEMA | Freq: Once | RECTAL | Status: DC | PRN

## 2015-01-27 MED ORDER — SODIUM CHLORIDE 0.9 % IJ SOLN
3.0000 mL | Freq: Two times a day (BID) | INTRAMUSCULAR | Status: DC
  Administered 2015-01-28: 3 mL via INTRAVENOUS

## 2015-01-27 MED ORDER — NORTRIPTYLINE HCL 25 MG PO CAPS
50.0000 mg | ORAL_CAPSULE | Freq: Every day | ORAL | Status: DC
  Administered 2015-01-27: 50 mg via ORAL
  Filled 2015-01-27: qty 2

## 2015-01-27 MED ORDER — GABAPENTIN 400 MG PO CAPS
400.0000 mg | ORAL_CAPSULE | Freq: Every day | ORAL | Status: DC
  Administered 2015-01-27: 400 mg via ORAL
  Filled 2015-01-27: qty 1

## 2015-01-27 MED ORDER — TRAMADOL HCL 50 MG PO TABS: 50.0000 mg | ORAL_TABLET | Freq: Four times a day (QID) | ORAL | Status: DC | PRN

## 2015-01-27 MED ORDER — DULOXETINE HCL 60 MG PO CPEP
90.0000 mg | ORAL_CAPSULE | Freq: Every day | ORAL | Status: DC
  Administered 2015-01-28: 90 mg via ORAL
  Filled 2015-01-27: qty 1

## 2015-01-27 MED ORDER — PRAMIPEXOLE DIHYDROCHLORIDE 0.25 MG PO TABS
0.2500 mg | ORAL_TABLET | Freq: Every day | ORAL | Status: DC
  Administered 2015-01-27: 0.25 mg via ORAL
  Filled 2015-01-27: qty 1

## 2015-01-27 MED ORDER — TRIAMCINOLONE ACETONIDE 0.1 % EX OINT
1.0000 "application " | TOPICAL_OINTMENT | Freq: Two times a day (BID) | CUTANEOUS | Status: DC | PRN
  Filled 2015-01-27: qty 15

## 2015-01-27 MED ORDER — POLYETHYLENE GLYCOL 3350 17 G PO PACK
17.0000 g | PACK | Freq: Every day | ORAL | Status: DC
  Administered 2015-01-28: 17 g via ORAL
  Filled 2015-01-27: qty 1

## 2015-01-27 MED ORDER — MONTELUKAST SODIUM 10 MG PO TABS
10.0000 mg | ORAL_TABLET | Freq: Every day | ORAL | Status: DC
  Administered 2015-01-27: 10 mg via ORAL
  Filled 2015-01-27: qty 1

## 2015-01-27 MED ORDER — ENSURE ENLIVE PO LIQD: 237.0000 mL | Freq: Two times a day (BID) | ORAL | Status: DC

## 2015-01-27 MED ORDER — TORSEMIDE 20 MG PO TABS
20.0000 mg | ORAL_TABLET | Freq: Two times a day (BID) | ORAL | Status: DC
  Administered 2015-01-27 – 2015-01-28 (×2): 20 mg via ORAL
  Filled 2015-01-27 (×2): qty 1

## 2015-01-27 MED ORDER — SODIUM CHLORIDE 0.9 % IV BOLUS (SEPSIS)
1000.0000 mL | Freq: Once | INTRAVENOUS | Status: AC
  Administered 2015-01-27: 1000 mL via INTRAVENOUS

## 2015-01-27 MED ORDER — SODIUM CHLORIDE 0.9 % IJ SOLN: 3.0000 mL | INTRAMUSCULAR | Status: DC | PRN

## 2015-01-27 MED ORDER — GABAPENTIN 100 MG PO CAPS
200.0000 mg | ORAL_CAPSULE | Freq: Every evening | ORAL | Status: DC
  Administered 2015-01-27: 200 mg via ORAL
  Filled 2015-01-27: qty 2

## 2015-01-27 MED ORDER — MORPHINE SULFATE (PF) 4 MG/ML IV SOLN
4.0000 mg | Freq: Once | INTRAVENOUS | Status: AC
  Administered 2015-01-27: 4 mg via INTRAVENOUS
  Filled 2015-01-27: qty 1

## 2015-01-27 MED ORDER — FLUTICASONE PROPIONATE 50 MCG/ACT NA SUSP
2.0000 | Freq: Every day | NASAL | Status: DC | PRN
  Filled 2015-01-27: qty 16

## 2015-01-27 MED ORDER — ENOXAPARIN SODIUM 40 MG/0.4ML ~~LOC~~ SOLN: 40.0000 mg | SUBCUTANEOUS | Status: DC

## 2015-01-27 MED ORDER — DOCUSATE SODIUM 100 MG PO CAPS
100.0000 mg | ORAL_CAPSULE | Freq: Two times a day (BID) | ORAL | Status: DC
  Administered 2015-01-27 – 2015-01-28 (×2): 100 mg via ORAL
  Filled 2015-01-27 (×2): qty 1

## 2015-01-27 MED ORDER — MELATONIN 5 MG PO CAPS: 5.0000 mg | ORAL_CAPSULE | Freq: Every day | ORAL | Status: DC

## 2015-01-27 MED ORDER — POTASSIUM CHLORIDE CRYS ER 20 MEQ PO TBCR
20.0000 meq | EXTENDED_RELEASE_TABLET | Freq: Two times a day (BID) | ORAL | Status: DC
  Administered 2015-01-27 – 2015-01-28 (×2): 20 meq via ORAL
  Filled 2015-01-27 (×2): qty 1

## 2015-01-27 MED ORDER — LOSARTAN POTASSIUM 50 MG PO TABS
25.0000 mg | ORAL_TABLET | Freq: Every day | ORAL | Status: DC
Start: 2015-01-27 — End: 2015-01-28
  Administered 2015-01-27 – 2015-01-28 (×2): 25 mg via ORAL
  Filled 2015-01-27 (×2): qty 1

## 2015-01-27 MED ORDER — TIOTROPIUM BROMIDE MONOHYDRATE 18 MCG IN CAPS
18.0000 ug | ORAL_CAPSULE | Freq: Every day | RESPIRATORY_TRACT | Status: DC
  Administered 2015-01-27 – 2015-01-28 (×2): 18 ug via RESPIRATORY_TRACT
  Filled 2015-01-27: qty 5

## 2015-01-27 MED ORDER — ACETAMINOPHEN 325 MG PO TABS: 650.0000 mg | ORAL_TABLET | Freq: Four times a day (QID) | ORAL | Status: DC | PRN

## 2015-01-27 MED ORDER — SODIUM CHLORIDE 0.9 % IV SOLN: 250.0000 mL | INTRAVENOUS | Status: DC | PRN

## 2015-01-27 MED ORDER — ONDANSETRON HCL 4 MG PO TABS: 4.0000 mg | ORAL_TABLET | Freq: Four times a day (QID) | ORAL | Status: DC | PRN

## 2015-01-27 MED ORDER — AMIODARONE HCL 200 MG PO TABS
200.0000 mg | ORAL_TABLET | Freq: Every day | ORAL | Status: DC
  Administered 2015-01-27 – 2015-01-28 (×2): 200 mg via ORAL
  Filled 2015-01-27 (×3): qty 1

## 2015-01-27 MED ORDER — GABAPENTIN 100 MG PO CAPS
300.0000 mg | ORAL_CAPSULE | Freq: Every morning | ORAL | Status: DC
  Administered 2015-01-28: 300 mg via ORAL
  Filled 2015-01-27: qty 3

## 2015-01-27 MED ORDER — NITROFURANTOIN MONOHYD MACRO 100 MG PO CAPS
100.0000 mg | ORAL_CAPSULE | Freq: Every day | ORAL | Status: DC
  Administered 2015-01-27: 100 mg via ORAL
  Filled 2015-01-27: qty 1

## 2015-01-27 MED ORDER — METOPROLOL SUCCINATE ER 25 MG PO TB24
25.0000 mg | ORAL_TABLET | Freq: Every day | ORAL | Status: DC
  Administered 2015-01-27: 25 mg via ORAL
  Filled 2015-01-27: qty 1

## 2015-01-27 MED ORDER — SUCRALFATE 1 G PO TABS
1.0000 g | ORAL_TABLET | Freq: Two times a day (BID) | ORAL | Status: DC
  Administered 2015-01-28: 1 g via ORAL
  Filled 2015-01-27: qty 1

## 2015-01-27 MED ORDER — BISACODYL 10 MG RE SUPP: 10.0000 mg | Freq: Every day | RECTAL | Status: DC | PRN

## 2015-01-27 MED ORDER — ALBUTEROL SULFATE (2.5 MG/3ML) 0.083% IN NEBU: 2.5000 mg | INHALATION_SOLUTION | RESPIRATORY_TRACT | Status: DC | PRN

## 2015-01-27 MED ORDER — DIMENHYDRINATE 50 MG PO TABS
25.0000 mg | ORAL_TABLET | Freq: Four times a day (QID) | ORAL | Status: DC | PRN
  Filled 2015-01-27: qty 1

## 2015-01-27 MED ORDER — ONDANSETRON HCL 4 MG/2ML IJ SOLN
4.0000 mg | Freq: Once | INTRAMUSCULAR | Status: AC
  Administered 2015-01-27: 4 mg via INTRAVENOUS
  Filled 2015-01-27: qty 2

## 2015-01-27 NOTE — ED Provider Notes (Signed)
CSN: [id number]     Arrival date & time 01/27/15  1432 History   First MD Initiated Contact with Patient 01/27/15 1503     Chief Complaint  Patient presents with  . Fatigue     (Consider location/radiation/quality/duration/timing/severity/associated sxs/prior Treatment) The history is provided by a relative and the patient.  Hailey [name] is a 81 y.o. female hx of hypertrophic cardiomyopathy, afib, COPD, CHF here with shortness of breath, poor appetite, altered mental status. Patient has a slow decline of her mental status for the last 2 weeks. States that she felt well before Thanksgiving had a vertebral fracture and since then has been less active. She has not been having good appetite. She has also been more forgetful and sometimes hear people knocking at night. She also sometimes sees things that are not there. She has not been taking her medicines and she is supposedly on 2 L nasal cannula but has not been using it. Per daughter, her oxygen dropped to 60s at night. Denies any chest pain or fevers. Was noted to be hypoxic 88% in triage.   Level V caveat- AMS   Past Medical History  Diagnosis Date  . DVT (deep vein thrombosis) in pregnancy 09/2008    a. LLE, recurrent hx, has IVC filter. Not on coumadin now with history of GI bleeding  . Hyperlipidemia   . GERD (gastroesophageal reflux disease)   . Hypertrophic cardiomyopathy ([place])     a. Echo 4/09 with EF 70-75%, asymmetricy basal septal hypertrophy, mild MR without systolic anterior motion of the mitral valve, LVOT gradient to 130 mmHg with Valsalva b. Echo 7/13: EF 60-65%, mild focal basal septal hypertrophy, no significant LVOT gradient, no MV SAM c. Echo 2/15: EF 55-60%, HOCM, resting LVOT gradient 29 mmHg, Valsalva LVOT gradient > 140 mmHg, mild LVH, mild TR, elevated PASP  . Anemia     Chronic  . Osteoarthritis     Hx of left TKR  . Fibromyalgia   . Cerebral aneurysm     Hx of  . Migraine headache     Hx of  . Hypertension    . COPD (chronic obstructive pulmonary disease) ([place])   . PUD (peptic ulcer disease)     With GI bleeding  . History of colonoscopy   . PAF (paroxysmal atrial fibrillation) ([place])     a. Full-dose ASA alone 2/2 h/o GIB.  Marland Kitchen OSA (obstructive sleep apnea)     a. Intolerant to CPAP, wears 2L via n/c  . Fibrocystic disease of breast   . Anxiety   . PAT (paroxysmal atrial tachycardia) ([place])   . Pneumonia   . 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 Asthma   . CHF (congestive heart failure) ([place])   . Arthritis   . Tremor   . Heart murmur   . Diverticulitis   . Pulmonary embolism ([place])   . Atrophic vaginitis   . Incomplete bladder emptying   . Gross hematuria   . Obesity   . Gout    Past Surgical History  Procedure Laterality Date  . Total knee arthroplasty      Left  . Bladder suspension    . Foot surgery      Bilateral  . Inguinal hernia repair  1968    Right  . Cerebral aneurysm repair  1998  . Abdominal hysterectomy    . Appendectomy    . Tonsillectomy and adenoidectomy    . Shoulder surgery  1972  Right   Left 2012  . Knee arthroscopy  2009    Right  . Cardiac catheterization  2009    No significant CAD  . Cardiovascular stress test      a. Lexiscan Myoview 3/14: EF 76%, no evidence of ischemia or WMAs  . Cataract surgery     Family History  Problem Relation Age of Onset  . Pancreatic cancer Mother   . Colon cancer Father   . Colon polyps Father   . Heart disease Father   . Heart disease Sister     More than 1 sister  . Colon cancer Brother   . Colon polyps Other     Siblings  . Ulcers Brother   . Hypertension Mother     father  . Diabetes Mellitus II Mother     Sister  . Stroke Father    Social History  Substance Use Topics  . Smoking status: Former Smoker -- 1.00 packs/day for 60 years    Types: Cigarettes    Quit date: 12/15/2008  . Smokeless tobacco: None     Comment: smoked for 50 years quit 5 + years  .  Alcohol Use: 0.0 oz/week    0 Standard drinks or equivalent per week     Comment: Socially   OB History    No data available     Review of Systems  Unable to perform ROS: Acuity of condition  Respiratory: Positive for shortness of breath.   Psychiatric/Behavioral: Positive for confusion.  All other systems reviewed and are negative.     Allergies  Aspirin; Codeine; Ketorolac tromethamine; Nsaids; and Phenazopyridine hcl  Home Medications   Prior to Admission medications   Medication Sig Start Date End Date Taking? Authorizing Provider  acetaminophen (TYLENOL) 500 MG tablet Take 1,000 mg by mouth every 6 (six) hours as needed for mild pain or headache.    Yes Historical Provider, MD  amiodarone (PACERONE) 200 MG tablet Take 200 mg by mouth daily. Pt is able to take an additional tablet if needed for breakthrough arrhythmias.   Yes Historical Provider, MD  dimenhyDRINATE (DRAMAMINE) 50 MG tablet Take 25-50 mg by mouth every 6 (six) hours as needed for dizziness.    Yes Historical Provider, MD  docusate sodium (COLACE) 100 MG capsule Take 100 mg by mouth 2 (two) times daily as needed for mild constipation.   Yes Historical Provider, MD  DULoxetine (CYMBALTA) 30 MG capsule Take 30 mg by mouth daily. Pt takes with a 60mg  capsule.   Yes Historical Provider, MD  DULoxetine (CYMBALTA) 60 MG capsule Take 60 mg by mouth daily. Pt takes with a 30mg  capsule.   Yes Historical Provider, MD  fluticasone (FLONASE) 50 MCG/ACT nasal spray Place 2 sprays into both nostrils daily as needed for rhinitis.   Yes Historical Provider, MD  gabapentin (NEURONTIN) 100 MG capsule Take 200-400 mg by mouth 3 (three) times daily. Pt takes three capsules in the morning, two capsules in the evening, and four capsules at bedtime.   Yes Historical Provider, MD  losartan (COZAAR) 50 MG tablet Take 0.5 tablets (25 mg total) by mouth daily. 11/05/14  Yes Minna Merritts, MD  lovastatin (MEVACOR) 40 MG tablet Take 40 mg by  mouth at bedtime.   Yes Historical Provider, MD  Melatonin 5 MG CAPS Take 5 mg by mouth at bedtime.   Yes Historical Provider, MD  metoprolol succinate (TOPROL-XL) 25 MG 24 hr tablet Take 25 mg by mouth at bedtime.  Yes Historical Provider, MD  montelukast (SINGULAIR) 10 MG tablet Take 10 mg by mouth at bedtime.   Yes Historical Provider, MD  nitrofurantoin, macrocrystal-monohydrate, (MACROBID) 100 MG capsule Take 1 capsule (100 mg total) by mouth at bedtime. 10/09/14  Yes Shannon A McGowan, PA-C  pantoprazole (PROTONIX) 40 MG tablet Take 40 mg by mouth daily.   Yes Historical Provider, MD  polyethylene glycol (MIRALAX / GLYCOLAX) packet Take 17 g by mouth daily as needed for moderate constipation.    Yes Historical Provider, MD  potassium chloride (K-DUR,KLOR-CON) 10 MEQ tablet Take 20 mEq by mouth 2 (two) times daily.   Yes Historical Provider, MD  pramipexole (MIRAPEX) 0.25 MG tablet Take 1 tablet by mouth at bedtime.    Yes Historical Provider, MD  sucralfate (CARAFATE) 1 G tablet Take 1 g by mouth 2 (two) times daily before a meal.    Yes Historical Provider, MD  tiotropium (SPIRIVA) 18 MCG inhalation capsule Place 18 mcg into inhaler and inhale daily.   Yes Historical Provider, MD  torsemide (DEMADEX) 20 MG tablet Take 20 mg by mouth 2 (two) times daily.    Yes Historical Provider, MD  traMADol (ULTRAM) 50 MG tablet Take 50 mg by mouth every 6 (six) hours as needed for moderate pain.   Yes Historical Provider, MD  triamcinolone ointment (KENALOG) 0.1 % Apply 1 application topically 2 (two) times daily as needed (for rash).    Yes Historical Provider, MD   BP 136/57 mmHg  Pulse 63  Temp(Src) 97.9 F (36.6 C) (Oral)  Resp 20  Ht 5\' 1"  (1.549 m)  Wt 172 lb (78.019 kg)  BMI 32.52 kg/m2  SpO2 88% Physical Exam  Constitutional:  Chronically ill   HENT:  Head: Normocephalic.  Mouth/Throat: Oropharynx is clear and moist.  Eyes: Conjunctivae are normal. Pupils are equal, round, and  reactive to light.  Neck: Normal range of motion. Neck supple.  Cardiovascular: Normal rate, regular rhythm and normal heart sounds.   Pulmonary/Chest:  Crackles bilateral bases   Abdominal: Soft. Bowel sounds are normal. She exhibits no distension. There is no tenderness. There is no rebound.  Musculoskeletal: Normal range of motion.  1+ edema bilaterally   Neurological: She is alert.  A &O x 2. Moving all extremities   Skin: Skin is warm and dry.  Psychiatric:  Unable   Nursing note and vitals reviewed.   ED Course  Procedures (including critical care time) Labs Review Labs Reviewed  BASIC METABOLIC PANEL - Abnormal; Notable for the following:    Sodium 134 (*)    Potassium 3.4 (*)    Chloride 97 (*)    All other components within normal limits  CBC - Abnormal; Notable for the following:    RBC 3.62 (*)    Hemoglobin 11.6 (*)    HCT 33.4 (*)    RDW 14.7 (*)    All other components within normal limits  HEPATIC FUNCTION PANEL - Abnormal; Notable for the following:    Alkaline Phosphatase 133 (*)    All other components within normal limits  TROPONIN I  BRAIN NATRIURETIC PEPTIDE  URINALYSIS COMPLETEWITH MICROSCOPIC ([facility] ONLY)  CBG MONITORING, ED    Imaging Review Dg Chest 2 View  01/27/2015  CLINICAL DATA:  Altered mental status beginning at Thanksgiving of this year, worsening. EXAM: CHEST  2 VIEW COMPARISON:  PA and lateral chest 01/13/2015 and 06/19/2013. FINDINGS: There is coarsening of the pulmonary interstitium. No consolidative process, pneumothorax or effusion is identified.  Heart size is normal. No focal bony abnormality. IMPRESSION: No acute disease. Electronically Signed   By: Inge Rise M.D.   On: 01/27/2015 15:39   Ct Head Wo Contrast  01/27/2015  CLINICAL DATA:  has been "declining" since her fall at Thanksgiving. They think she has altered mental status - hearing things (knocks on the door at night), seeing things ( EXAM: CT HEAD WITHOUT CONTRAST  TECHNIQUE: Contiguous axial images were obtained from the base of the skull through the vertex without intravenous contrast. COMPARISON:  07/31/2014 FINDINGS: Atherosclerotic and physiologic intracranial calcifications. Aneurysm clip near the proximal left MCA. Stable changes of previous left temporal craniotomy. Moderate parenchymal atrophy. Patchy areas of hypoattenuation in deep and periventricular white matter bilaterally. Negative for acute intracranial hemorrhage, mass lesion, acute infarction, midline shift, or mass-effect. Acute infarct may be inapparent on noncontrast CT. Ventricles and sulci symmetric. Bone windows demonstrate no focal lesion. IMPRESSION: 1.   1.  Negative for bleed or other acute intracranial process. 2. Atrophy and nonspecific white matter changes. 3. Stable postop changes of left aneurysm clipping. 2. Electronically Signed   By: Lucrezia Europe M.D.   On: 01/27/2015 16:17   I have personally reviewed and evaluated these images and lab results as part of my medical decision-making.   EKG Interpretation None       ED ECG REPORT I, YAO, DAVID, the attending physician, personally viewed and interpreted this ECG.   Date: 01/27/2015  EKG Time: 14:50  Rate: 62  Rhythm: normal EKG, normal sinus rhythm  Axis: normal  Intervals:none  ST&T Change: nonspecific    MDM   Final diagnoses:  None    Hailey [name] is a 81 y.o. female here with hypoxia, AMS, failure to thrive. Consider COPD vs CHF vs ACS vs dehydration vs electrolyte abnormalities vs infection. Will get labs, BNP, CXR, UA.   4:43 PM CT head unremarkable. Appears dehydrated. Will admit for AMS, dehydration.     Wandra Arthurs, MD 01/27/15 [phone]

## 2015-01-27 NOTE — ED Notes (Signed)
Pt presents with daughter in reports that pt with increases weakness and confusion starting shortly after thanksgiving.  Pt is supposed to be on home 02 continuous at 2 liters and has not been using it and also has not been taking her medications per daughter. Pt is resident at [facility].  Pt a/ox2 in triage with low 02.

## 2015-01-27 NOTE — Progress Notes (Signed)

## 2015-01-27 NOTE — H&P (Signed)
[facility] [place] NAME: Hailey [name]    MR#:  [id number]  DATE OF BIRTH:  Oct 17, 1932  DATE OF ADMISSION:  01/27/2015  PRIMARY CARE PHYSICIAN: Marcello Fennel, MD   REQUESTING/REFERRING PHYSICIAN: Dr. Darl Householder  CHIEF COMPLAINT:   Chief Complaint  Patient presents with  . Fatigue    HISTORY OF PRESENT ILLNESS:  Hailey [name]  is a 81 y.o. female with a known history of early dementia, hypertension, paroxysmal atrial fibrillation with recent vertebral fracture return to the emergency room brought in by daughter due to generalized weakness. Patient has been sitting in her recliner all day and not eating or taking her medications. Patient mentions that whenever she wakes up from sleep she sees things that are not present but this quickly resolves. No other confusion other than mainly when she wakes up from sleep. No recent change in medications except tramadol started for her pain. Patient had seen Dr. Mack Guise of orthopedics who advised bedrest. Daughter mentions that since this visit patient has not been ambulating much. There is also concerned regarding compliance with medications. Patient is being admitted for generalized weakness and unable to care for herself at an independent facility. Patient does not want to take aspirin or NSAIDs due to history of GI bleed. Daughter mentions that she was addicted to narcotics in the past. And has chronic nausea for many years.  PAST MEDICAL HISTORY:   Past Medical History  Diagnosis Date  . DVT (deep vein thrombosis) in pregnancy 09/2008    a. LLE, recurrent hx, has IVC filter. Not on coumadin now with history of GI bleeding  . Hyperlipidemia   . GERD (gastroesophageal reflux disease)   . Hypertrophic cardiomyopathy ([place])     a. Echo 4/09 with EF 70-75%, asymmetricy basal septal hypertrophy, mild MR without systolic anterior motion of the mitral valve, LVOT gradient to 130 mmHg with Valsalva b. Echo  7/13: EF 60-65%, mild focal basal septal hypertrophy, no significant LVOT gradient, no MV SAM c. Echo 2/15: EF 55-60%, HOCM, resting LVOT gradient 29 mmHg, Valsalva LVOT gradient > 140 mmHg, mild LVH, mild TR, elevated PASP  . Anemia     Chronic  . Osteoarthritis     Hx of left TKR  . Fibromyalgia   . Cerebral aneurysm     Hx of  . Migraine headache     Hx of  . Hypertension   . COPD (chronic obstructive pulmonary disease) ([place])   . PUD (peptic ulcer disease)     With GI bleeding  . History of colonoscopy   . PAF (paroxysmal atrial fibrillation) ([place])     a. Full-dose ASA alone 2/2 h/o GIB.  Marland Kitchen OSA (obstructive sleep apnea)     a. Intolerant to CPAP, wears 2L via n/c  . Fibrocystic disease of breast   . Anxiety   . PAT (paroxysmal atrial tachycardia) ([place])   . Pneumonia   . 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 Asthma   . CHF (congestive heart failure) ([place])   . Arthritis   . Tremor   . Heart murmur   . Diverticulitis   . Pulmonary embolism ([place])   . Atrophic vaginitis   . Incomplete bladder emptying   . Gross hematuria   . Obesity   . Gout     PAST SURGICAL HISTORY:   Past Surgical History  Procedure Laterality Date  . Total knee arthroplasty  Left  . Bladder suspension    . Foot surgery      Bilateral  . Inguinal hernia repair  1968    Right  . Cerebral aneurysm repair  1998  . Abdominal hysterectomy    . Appendectomy    . Tonsillectomy and adenoidectomy    . Shoulder surgery  1972    Right   Left 2012  . Knee arthroscopy  2009    Right  . Cardiac catheterization  2009    No significant CAD  . Cardiovascular stress test      a. Lexiscan Myoview 3/14: EF 76%, no evidence of ischemia or WMAs  . Cataract surgery      SOCIAL HISTORY:   Social History  Substance Use Topics  . Smoking status: Former Smoker -- 1.00 packs/day for 60 years    Types: Cigarettes    Quit date: 12/15/2008  . Smokeless tobacco: Not on  file     Comment: smoked for 50 years quit 5 + years  . Alcohol Use: 0.0 oz/week    0 Standard drinks or equivalent per week     Comment: Socially    FAMILY HISTORY:   Family History  Problem Relation Age of Onset  . Pancreatic cancer Mother   . Colon cancer Father   . Colon polyps Father   . Heart disease Father   . Heart disease Sister     More than 1 sister  . Colon cancer Brother   . Colon polyps Other     Siblings  . Ulcers Brother   . Hypertension Mother     father  . Diabetes Mellitus II Mother     Sister  . Stroke Father     DRUG ALLERGIES:   Allergies  Allergen Reactions  . Aspirin Other (See Comments)    Reaction:  Caused pt to have a GI bleed   . Codeine Nausea And Vomiting  . Ketorolac Tromethamine Other (See Comments)    Reaction:  Headache   . Nsaids Other (See Comments)    Pt is unable to take because she had a GI bleed.   . Phenazopyridine Hcl Other (See Comments)    Reaction:  Vision problems     REVIEW OF SYSTEMS:   Review of Systems  Constitutional: Positive for weight loss and malaise/fatigue. Negative for fever and chills.  HENT: Negative for hearing loss and nosebleeds.   Eyes: Negative for blurred vision, double vision and pain.  Respiratory: Negative for cough, hemoptysis, sputum production, shortness of breath and wheezing.   Cardiovascular: Negative for chest pain, palpitations, orthopnea and leg swelling.  Gastrointestinal: Positive for nausea (chronic). Negative for vomiting, abdominal pain, diarrhea and constipation.  Genitourinary: Negative for dysuria and hematuria.  Musculoskeletal: Positive for back pain. Negative for myalgias and falls.  Skin: Negative for rash.  Neurological: Positive for weakness. Negative for dizziness, tremors, sensory change, speech change, focal weakness, seizures and headaches.  Endo/Heme/Allergies: Does not bruise/bleed easily.  Psychiatric/Behavioral: Positive for memory loss. Negative for depression.  The patient has insomnia. The patient is not nervous/anxious.     MEDICATIONS AT HOME:   Prior to Admission medications   Medication Sig Start Date End Date Taking? Authorizing Provider  acetaminophen (TYLENOL) 500 MG tablet Take 1,000 mg by mouth every 6 (six) hours as needed for mild pain or headache.    Yes Historical Provider, MD  amiodarone (PACERONE) 200 MG tablet Take 200 mg by mouth daily. Pt is able to take an additional  tablet if needed for breakthrough arrhythmias.   Yes Historical Provider, MD  dimenhyDRINATE (DRAMAMINE) 50 MG tablet Take 25-50 mg by mouth every 6 (six) hours as needed for dizziness.    Yes Historical Provider, MD  docusate sodium (COLACE) 100 MG capsule Take 100 mg by mouth 2 (two) times daily as needed for mild constipation.   Yes Historical Provider, MD  DULoxetine (CYMBALTA) 30 MG capsule Take 30 mg by mouth daily. Pt takes with a 60mg  capsule.   Yes Historical Provider, MD  DULoxetine (CYMBALTA) 60 MG capsule Take 60 mg by mouth daily. Pt takes with a 30mg  capsule.   Yes Historical Provider, MD  fluticasone (FLONASE) 50 MCG/ACT nasal spray Place 2 sprays into both nostrils daily as needed for rhinitis.   Yes Historical Provider, MD  gabapentin (NEURONTIN) 100 MG capsule Take 200-400 mg by mouth 3 (three) times daily. Pt takes three capsules in the morning, two capsules in the evening, and four capsules at bedtime.   Yes Historical Provider, MD  losartan (COZAAR) 50 MG tablet Take 0.5 tablets (25 mg total) by mouth daily. 11/05/14  Yes Minna Merritts, MD  lovastatin (MEVACOR) 40 MG tablet Take 40 mg by mouth at bedtime.   Yes Historical Provider, MD  Melatonin 5 MG CAPS Take 5 mg by mouth at bedtime.   Yes Historical Provider, MD  metoprolol succinate (TOPROL-XL) 25 MG 24 hr tablet Take 25 mg by mouth at bedtime.    Yes Historical Provider, MD  montelukast (SINGULAIR) 10 MG tablet Take 10 mg by mouth at bedtime.   Yes Historical Provider, MD  nitrofurantoin,  macrocrystal-monohydrate, (MACROBID) 100 MG capsule Take 1 capsule (100 mg total) by mouth at bedtime. 10/09/14  Yes Shannon A McGowan, PA-C  nortriptyline (PAMELOR) 50 MG capsule Take 50 mg by mouth at bedtime.   Yes Historical Provider, MD  pantoprazole (PROTONIX) 40 MG tablet Take 40 mg by mouth daily.   Yes Historical Provider, MD  polyethylene glycol (MIRALAX / GLYCOLAX) packet Take 17 g by mouth daily as needed for moderate constipation.    Yes Historical Provider, MD  potassium chloride (K-DUR,KLOR-CON) 10 MEQ tablet Take 20 mEq by mouth 2 (two) times daily.   Yes Historical Provider, MD  pramipexole (MIRAPEX) 0.25 MG tablet Take 1 tablet by mouth at bedtime.    Yes Historical Provider, MD  sucralfate (CARAFATE) 1 G tablet Take 1 g by mouth 2 (two) times daily before a meal.    Yes Historical Provider, MD  tiotropium (SPIRIVA) 18 MCG inhalation capsule Place 18 mcg into inhaler and inhale daily.   Yes Historical Provider, MD  torsemide (DEMADEX) 20 MG tablet Take 20 mg by mouth 2 (two) times daily.    Yes Historical Provider, MD  traMADol (ULTRAM) 50 MG tablet Take 50 mg by mouth every 6 (six) hours as needed for moderate pain.   Yes Historical Provider, MD  triamcinolone ointment (KENALOG) 0.1 % Apply 1 application topically 2 (two) times daily as needed (for rash).    Yes Historical Provider, MD      VITAL SIGNS:  Blood pressure 136/57, pulse 63, temperature 97.9 F (36.6 C), temperature source Oral, resp. rate 20, height 5\' 1"  (1.549 m), weight 78.019 kg (172 lb), SpO2 88 %.  PHYSICAL EXAMINATION:  Physical Exam  GENERAL:  81 y.o.-year-old patient lying in the bed with no acute distress.  EYES: Pupils equal, round, reactive to light and accommodation. No scleral icterus. Extraocular muscles intact.  HEENT: Head  atraumatic, normocephalic. Oropharynx and nasopharynx clear. No oropharyngeal erythema, moist oral mucosa  NECK:  Supple, no jugular venous distention. No thyroid enlargement,  no tenderness.  LUNGS: Normal breath sounds bilaterally, no wheezing, rales, rhonchi. No use of accessory muscles of respiration.  CARDIOVASCULAR: S1, S2 normal. No murmurs, rubs, or gallops.  ABDOMEN: Soft, nontender, nondistended. Bowel sounds present. No organomegaly or mass.  EXTREMITIES: No pedal edema, cyanosis, or clubbing. + 2 pedal & radial pulses b/l.   NEUROLOGIC: Cranial nerves II through XII are intact. No focal Motor or sensory deficits appreciated b/l PSYCHIATRIC: The patient is alert and oriented x 3. Good affect.  SKIN: No obvious rash, lesion, or ulcer.   LABORATORY PANEL:   CBC  Recent Labs Lab 01/27/15 1446  WBC 5.4  HGB 11.6*  HCT 33.4*  PLT 327   ------------------------------------------------------------------------------------------------------------------  Chemistries   Recent Labs Lab 01/27/15 1445 01/27/15 1446  NA  --  134*  K  --  3.4*  CL  --  97*  CO2  --  25  GLUCOSE  --  99  BUN  --  9  CREATININE  --  0.62  CALCIUM  --  9.2  AST 25  --   ALT 14  --   ALKPHOS 133*  --   BILITOT 0.7  --    ------------------------------------------------------------------------------------------------------------------  Cardiac Enzymes  Recent Labs Lab 01/27/15 1445  TROPONINI <0.03   ------------------------------------------------------------------------------------------------------------------  RADIOLOGY:  Dg Chest 2 View  01/27/2015  CLINICAL DATA:  Altered mental status beginning at Thanksgiving of this year, worsening. EXAM: CHEST  2 VIEW COMPARISON:  PA and lateral chest 01/13/2015 and 06/19/2013. FINDINGS: There is coarsening of the pulmonary interstitium. No consolidative process, pneumothorax or effusion is identified. Heart size is normal. No focal bony abnormality. IMPRESSION: No acute disease. Electronically Signed   By: Inge Rise M.D.   On: 01/27/2015 15:39   Ct Head Wo Contrast  01/27/2015  CLINICAL DATA:  has been  "declining" since her fall at Thanksgiving. They think she has altered mental status - hearing things (knocks on the door at night), seeing things ( EXAM: CT HEAD WITHOUT CONTRAST TECHNIQUE: Contiguous axial images were obtained from the base of the skull through the vertex without intravenous contrast. COMPARISON:  07/31/2014 FINDINGS: Atherosclerotic and physiologic intracranial calcifications. Aneurysm clip near the proximal left MCA. Stable changes of previous left temporal craniotomy. Moderate parenchymal atrophy. Patchy areas of hypoattenuation in deep and periventricular white matter bilaterally. Negative for acute intracranial hemorrhage, mass lesion, acute infarction, midline shift, or mass-effect. Acute infarct may be inapparent on noncontrast CT. Ventricles and sulci symmetric. Bone windows demonstrate no focal lesion. IMPRESSION: 1.   1.  Negative for bleed or other acute intracranial process. 2. Atrophy and nonspecific white matter changes. 3. Stable postop changes of left aneurysm clipping. 2. Electronically Signed   By: Lucrezia Europe M.D.   On: 01/27/2015 16:17     IMPRESSION AND PLAN:   * Weakness Patient unable to get out bed and take care of self. This has caused her to skip meals and taking pills. Also was recently started on trauma doll for pain which could be causing her symptoms. May need placement. Consult physical therapy. Has seen Dr. Lisette Grinder of orthopedics as outpatient and advised conservative management of vertebral compression fracture. Will need to continue tramadol as needed as patient does not want to take NSAIDs.  * Mild cognitive impairment Watch for inpatient delirium.  * Hypertension Continue home medications  *  Paroxysmal atrial fibrillation Continue amiodarone. In normal sinus rhythm.  * Chronic nausea Zofran and Dramamine as needed  * Chronic respiratory failure on 2 L oxygen is stable  * DVT prophylaxis with Lovenox   All the records are reviewed  and case discussed with ED provider. Management plans discussed with the patient, family and they are in agreement.  CODE STATUS: FULL  TOTAL TIME TAKING CARE OF THIS PATIENT: 50 minutes.    [name] R M.D on 01/27/2015 at 5:29 PM  Between 7am to 6pm - Pager - [phone]  After 6pm go to [email or web address] - password [email or web address] [facility]  Office  [phone]  CC: Primary care physician; BABAOFF, Caryl Bis, MD   Note: This dictation was prepared with Dragon dictation along with smaller phrase technology. Any transcriptional errors that result from this process are unintentional.

## 2015-01-27 NOTE — ED Notes (Signed)
Pt's daughter indicates that pt has been "declining" since her fall at Thanksgiving. They think she has altered mental status - hearing things (knocks on the door at night), seeing things (people and her dog - not there). Pt has stopped showering, has not been taking her medication, and has gone full days without eating. She is being treated for chronic dehydration, so has balance issues.

## 2015-01-28 DIAGNOSIS — N390 Urinary tract infection, site not specified: Secondary | ICD-10-CM | POA: Diagnosis not present

## 2015-01-28 DIAGNOSIS — I10 Essential (primary) hypertension: Secondary | ICD-10-CM | POA: Diagnosis not present

## 2015-01-28 DIAGNOSIS — I480 Paroxysmal atrial fibrillation: Secondary | ICD-10-CM | POA: Diagnosis not present

## 2015-01-28 DIAGNOSIS — I110 Hypertensive heart disease with heart failure: Secondary | ICD-10-CM | POA: Diagnosis not present

## 2015-01-28 DIAGNOSIS — I509 Heart failure, unspecified: Secondary | ICD-10-CM | POA: Diagnosis not present

## 2015-01-28 DIAGNOSIS — R531 Weakness: Secondary | ICD-10-CM | POA: Diagnosis not present

## 2015-01-28 DIAGNOSIS — J9610 Chronic respiratory failure, unspecified whether with hypoxia or hypercapnia: Secondary | ICD-10-CM | POA: Diagnosis not present

## 2015-01-28 DIAGNOSIS — J449 Chronic obstructive pulmonary disease, unspecified: Secondary | ICD-10-CM | POA: Diagnosis not present

## 2015-01-28 DIAGNOSIS — S32009A Unspecified fracture of unspecified lumbar vertebra, initial encounter for closed fracture: Secondary | ICD-10-CM | POA: Diagnosis not present

## 2015-01-28 DIAGNOSIS — Z87891 Personal history of nicotine dependence: Secondary | ICD-10-CM | POA: Diagnosis not present

## 2015-01-28 LAB — VITAMIN B12: VITAMIN B 12: 403 pg/mL (ref 180–914)

## 2015-01-28 MED ORDER — ENSURE ENLIVE PO LIQD: 237.0000 mL | Freq: Two times a day (BID) | ORAL | Status: DC

## 2015-01-28 NOTE — Care Management Note (Signed)
Case Management Note  Patient Details  Name: ESTHEFANY HOLLENDER MRN: [id number] Date of Birth: [birth year]  Subjective/Objective:     Requested Advance send a Sw for futher assessment of home environment and home safety               Action/Plan:   Expected Discharge Date:                  Expected Discharge Plan:  [place]  In-House Referral:     Discharge planning Services  CM Consult  Post Acute Care Choice:  Home Health Choice offered to:  Adult Children  DME Arranged:    DME Agency:     HH Arranged:  RN, PT, Nurse's Aide [place] Agency:  [facility]  Status of Service:  Completed, signed off  Medicare Important Message Given:    Date Medicare IM Given:    Medicare IM give by:    Date Additional Medicare IM Given:    Additional Medicare Important Message give by:     If discussed at [place] of Stay Meetings, dates discussed:    Additional Comments:  Jolly Mango, RN 01/28/2015, 1:51 PM

## 2015-01-28 NOTE — Progress Notes (Signed)
   01/28/15 1100  Clinical Encounter Type  Visited With Patient  Visit Type Initial  Consult/Referral To Chaplain  Stress Factors  Patient Stress Factors Family relationships;Health changes;Loss of control  Chaplain rounded in unit and offered pastoral care. Patient expressed concerns about relationship daughter and expressed that she is not being heard when it concerns her independence and need for help in certain areas.   Chaplain Jenefer Woerner [phone]

## 2015-01-28 NOTE — Discharge Summary (Signed)
[facility] [place] NAME: Hailey [name]    MR#:  [id number]  DATE OF BIRTH:  September 13, 1932  DATE OF ADMISSION:  01/27/2015 ADMITTING PHYSICIAN: [name], MD  DATE OF DISCHARGE: 01/28/2015  PRIMARY CARE PHYSICIAN: BABAOFF, Caryl Bis, MD    ADMISSION DIAGNOSIS:  Altered Mental Status  DISCHARGE DIAGNOSIS:  Active Problems:   Weakness   SECONDARY DIAGNOSIS:   Past Medical History  Diagnosis Date  . DVT (deep vein thrombosis) in pregnancy 09/2008    a. LLE, recurrent hx, has IVC filter. Not on coumadin now with history of GI bleeding  . Hyperlipidemia   . GERD (gastroesophageal reflux disease)   . Hypertrophic cardiomyopathy ([place])     a. Echo 4/09 with EF 70-75%, asymmetricy basal septal hypertrophy, mild MR without systolic anterior motion of the mitral valve, LVOT gradient to 130 mmHg with Valsalva b. Echo 7/13: EF 60-65%, mild focal basal septal hypertrophy, no significant LVOT gradient, no MV SAM c. Echo 2/15: EF 55-60%, HOCM, resting LVOT gradient 29 mmHg, Valsalva LVOT gradient > 140 mmHg, mild LVH, mild TR, elevated PASP  . Anemia     Chronic  . Osteoarthritis     Hx of left TKR  . Fibromyalgia   . Cerebral aneurysm     Hx of  . Migraine headache     Hx of  . Hypertension   . COPD (chronic obstructive pulmonary disease) ([place])   . PUD (peptic ulcer disease)     With GI bleeding  . History of colonoscopy   . PAF (paroxysmal atrial fibrillation) ([place])     a. Full-dose ASA alone 2/2 h/o GIB.  Marland Kitchen OSA (obstructive sleep apnea)     a. Intolerant to CPAP, wears 2L via n/c  . Fibrocystic disease of breast   . Anxiety   . PAT (paroxysmal atrial tachycardia) ([place])   . Pneumonia   . 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 Asthma   . CHF (congestive heart failure) ([place])   . Arthritis   . Tremor   . Heart murmur   . Diverticulitis   . Pulmonary embolism ([place])   . Atrophic vaginitis   .  Incomplete bladder emptying   . Gross hematuria   . Obesity   . Gout     HOSPITAL COURSE:   81 year old female with early dementia, PAF and recent vertebral fracture who presented to the hospital with generalized weakness.  1. Generalized weakness: This is secondary to her vertebral fracture. She is unable to care for herself. Physical therapy has been consulted for discharge planning. They are recommending home with home health care.   2. Vertebral compression fracture: Dr. Mack Guise of orthopedics has advised conservative management.  3. Essential hypertension: Continue losartan and metoprolol.  4. PAF: Continue metoprolol and amiodarone. She is on aspirin due to history of GI bleed  5. Chronic UTI: Continue Macrobid    DISCHARGE CONDITIONS AND DIET:   Patient will be discharged home with home health care on a heart healthy diet  CONSULTS OBTAINED:     DRUG ALLERGIES:   Allergies  Allergen Reactions  . Aspirin Other (See Comments)    Reaction:  Caused pt to have a GI bleed   . Codeine Nausea And Vomiting  . Ketorolac Tromethamine Other (See Comments)    Reaction:  Headache   . Nsaids Other (See Comments)    Pt is unable to take because she had  a GI bleed.   . Phenazopyridine Hcl Other (See Comments)    Reaction:  Vision problems     DISCHARGE MEDICATIONS:   Current Discharge Medication List    START taking these medications   Details  feeding supplement, ENSURE ENLIVE, (ENSURE ENLIVE) LIQD Take 237 mLs by mouth 2 (two) times daily between meals. Qty: 237 mL, Refills: 12      CONTINUE these medications which have NOT CHANGED   Details  acetaminophen (TYLENOL) 500 MG tablet Take 1,000 mg by mouth every 6 (six) hours as needed for mild pain or headache.     amiodarone (PACERONE) 200 MG tablet Take 200 mg by mouth daily. Pt is able to take an additional tablet if needed for breakthrough arrhythmias.    dimenhyDRINATE (DRAMAMINE) 50 MG tablet Take 25-50 mg by  mouth every 6 (six) hours as needed for dizziness.     docusate sodium (COLACE) 100 MG capsule Take 100 mg by mouth 2 (two) times daily as needed for mild constipation.    !! DULoxetine (CYMBALTA) 30 MG capsule Take 30 mg by mouth daily. Pt takes with a 60mg  capsule.    !! DULoxetine (CYMBALTA) 60 MG capsule Take 60 mg by mouth daily. Pt takes with a 30mg  capsule.    fluticasone (FLONASE) 50 MCG/ACT nasal spray Place 2 sprays into both nostrils daily as needed for rhinitis.    gabapentin (NEURONTIN) 100 MG capsule Take 200-400 mg by mouth 3 (three) times daily. Pt takes three capsules in the morning, two capsules in the evening, and four capsules at bedtime.    losartan (COZAAR) 50 MG tablet Take 0.5 tablets (25 mg total) by mouth daily. Qty: 30 tablet, Refills: 3    lovastatin (MEVACOR) 40 MG tablet Take 40 mg by mouth at bedtime.    Melatonin 5 MG CAPS Take 5 mg by mouth at bedtime.    metoprolol succinate (TOPROL-XL) 25 MG 24 hr tablet Take 25 mg by mouth at bedtime.     montelukast (SINGULAIR) 10 MG tablet Take 10 mg by mouth at bedtime.    nitrofurantoin, macrocrystal-monohydrate, (MACROBID) 100 MG capsule Take 1 capsule (100 mg total) by mouth at bedtime. Qty: 90 capsule, Refills: 0   Associated Diagnoses: Recurrent UTI    nortriptyline (PAMELOR) 50 MG capsule Take 50 mg by mouth at bedtime.    pantoprazole (PROTONIX) 40 MG tablet Take 40 mg by mouth daily.    polyethylene glycol (MIRALAX / GLYCOLAX) packet Take 17 g by mouth daily as needed for moderate constipation.     potassium chloride (K-DUR,KLOR-CON) 10 MEQ tablet Take 20 mEq by mouth 2 (two) times daily.    pramipexole (MIRAPEX) 0.25 MG tablet Take 1 tablet by mouth at bedtime.     sucralfate (CARAFATE) 1 G tablet Take 1 g by mouth 2 (two) times daily before a meal.     tiotropium (SPIRIVA) 18 MCG inhalation capsule Place 18 mcg into inhaler and inhale daily.    torsemide (DEMADEX) 20 MG tablet Take 20 mg by  mouth 2 (two) times daily.     traMADol (ULTRAM) 50 MG tablet Take 50 mg by mouth every 6 (six) hours as needed for moderate pain.    triamcinolone ointment (KENALOG) 0.1 % Apply 1 application topically 2 (two) times daily as needed (for rash).      !! - Potential duplicate medications found. Please discuss with provider.            Today   CHIEF COMPLAINT:  No acute events overnight.   VITAL SIGNS:  Blood pressure 111/50, pulse 56, temperature 98.4 F (36.9 C), temperature source Oral, resp. rate 19, height 5\' 1"  (1.549 m), weight 83.507 kg (184 lb 1.6 oz), SpO2 96 %.   REVIEW OF SYSTEMS:  Review of Systems  Constitutional: Negative for fever, chills and malaise/fatigue.  HENT: Negative for sore throat.   Eyes: Negative for blurred vision.  Respiratory: Negative for cough, hemoptysis, shortness of breath and wheezing.   Cardiovascular: Negative for chest pain, palpitations and leg swelling.  Gastrointestinal: Negative for nausea, vomiting, abdominal pain, diarrhea and blood in stool.  Genitourinary: Negative for dysuria.  Musculoskeletal: Negative for back pain.  Neurological: Negative for dizziness, tremors and headaches.  Endo/Heme/Allergies: Does not bruise/bleed easily.     PHYSICAL EXAMINATION:  GENERAL:  81 y.o.-year-old patient lying in the bed with no acute distress.  NECK:  Supple, no jugular venous distention. No thyroid enlargement, no tenderness.  LUNGS: Normal breath sounds bilaterally, no wheezing, rales,rhonchi  No use of accessory muscles of respiration.  CARDIOVASCULAR: S1, S2 normal. No murmurs, rubs, or gallops.  ABDOMEN: Soft, non-tender, non-distended. Bowel sounds present. No organomegaly or mass.  EXTREMITIES: No pedal edema, cyanosis, or clubbing.  PSYCHIATRIC: The patient is alert and oriented x 3.  SKIN: No obvious rash, lesion, or ulcer.   DATA REVIEW:   CBC  Recent Labs Lab 01/27/15 1446  WBC 5.4  HGB 11.6*  HCT 33.4*  PLT  327    Chemistries   Recent Labs Lab 01/27/15 1445 01/27/15 1446  NA  --  134*  K  --  3.4*  CL  --  97*  CO2  --  25  GLUCOSE  --  99  BUN  --  9  CREATININE  --  0.62  CALCIUM  --  9.2  AST 25  --   ALT 14  --   ALKPHOS 133*  --   BILITOT 0.7  --     Cardiac Enzymes  Recent Labs Lab 01/27/15 1445  TROPONINI <0.03    Microbiology Results  @MICRORSLT48 @  RADIOLOGY:  Dg Chest 2 View  01/27/2015  CLINICAL DATA:  Altered mental status beginning at Thanksgiving of this year, worsening. EXAM: CHEST  2 VIEW COMPARISON:  PA and lateral chest 01/13/2015 and 06/19/2013. FINDINGS: There is coarsening of the pulmonary interstitium. No consolidative process, pneumothorax or effusion is identified. Heart size is normal. No focal bony abnormality. IMPRESSION: No acute disease. Electronically Signed   By: Inge Rise M.D.   On: 01/27/2015 15:39   Ct Head Wo Contrast  01/27/2015  CLINICAL DATA:  has been "declining" since her fall at Thanksgiving. They think she has altered mental status - hearing things (knocks on the door at night), seeing things ( EXAM: CT HEAD WITHOUT CONTRAST TECHNIQUE: Contiguous axial images were obtained from the base of the skull through the vertex without intravenous contrast. COMPARISON:  07/31/2014 FINDINGS: Atherosclerotic and physiologic intracranial calcifications. Aneurysm clip near the proximal left MCA. Stable changes of previous left temporal craniotomy. Moderate parenchymal atrophy. Patchy areas of hypoattenuation in deep and periventricular white matter bilaterally. Negative for acute intracranial hemorrhage, mass lesion, acute infarction, midline shift, or mass-effect. Acute infarct may be inapparent on noncontrast CT. Ventricles and sulci symmetric. Bone windows demonstrate no focal lesion. IMPRESSION: 1.   1.  Negative for bleed or other acute intracranial process. 2. Atrophy and nonspecific white matter changes. 3. Stable postop changes of left  aneurysm clipping. 2.  Electronically Signed   By: Lucrezia Europe M.D.   On: 01/27/2015 16:17      Management plans discussed with the patient and she is in agreement. Stable for discharge home with [facility]  Patient should follow up with PCP in 1 week  CODE STATUS:     Code Status Orders        Start     Ordered   01/27/15 1723  Full code   Continuous     01/27/15 1723      TOTAL TIME TAKING CARE OF THIS PATIENT: 35 minutes.    Note: This dictation was prepared with Dragon dictation along with smaller phrase technology. Any transcriptional errors that result from this process are unintentional.  Zerina Hallinan M.D on 01/28/2015 at 9:55 AM  Between 7am to 6pm - Pager - [phone] After 6pm go to [email or web address] - password [email or web address] [facility]  Office  [phone]  CC: Primary care physician; BABAOFF, Caryl Bis, MD

## 2015-01-28 NOTE — Progress Notes (Signed)
[facility] [place] NAME: Hailey [name]    MR#:  [id number]  DATE OF BIRTH:  Jan 20, 1933  SUBJECTIVE:   Patient does not voice any complaint No acute events overnight. REVIEW OF SYSTEMS:    Review of Systems  Constitutional: Negative for fever, chills and malaise/fatigue.  HENT: Negative for sore throat.   Eyes: Negative for blurred vision.  Respiratory: Negative for cough, hemoptysis, shortness of breath and wheezing.   Cardiovascular: Negative for chest pain, palpitations and leg swelling.  Gastrointestinal: Negative for nausea, vomiting, abdominal pain, diarrhea and blood in stool.  Genitourinary: Negative for dysuria.  Musculoskeletal: Positive for back pain and joint pain.  Neurological: Negative for dizziness, tremors and headaches.  Endo/Heme/Allergies: Does not bruise/bleed easily.    Tolerating Diet: Yes      DRUG ALLERGIES:   Allergies  Allergen Reactions  . Aspirin Other (See Comments)    Reaction:  Caused pt to have a GI bleed   . Codeine Nausea And Vomiting  . Ketorolac Tromethamine Other (See Comments)    Reaction:  Headache   . Nsaids Other (See Comments)    Pt is unable to take because she had a GI bleed.   . Phenazopyridine Hcl Other (See Comments)    Reaction:  Vision problems     VITALS:  Blood pressure 111/50, pulse 56, temperature 98.4 F (36.9 C), temperature source Oral, resp. rate 19, height 5\' 1"  (1.549 m), weight 83.507 kg (184 lb 1.6 oz), SpO2 96 %.  PHYSICAL EXAMINATION:   Physical Exam  Constitutional: She is oriented to person, place, and time and well-developed, well-nourished, and in no distress. No distress.  HENT:  Head: Normocephalic.  Eyes: No scleral icterus.  Neck: Normal range of motion. Neck supple. No JVD present. No tracheal deviation present.  Cardiovascular: Normal rate, regular rhythm and normal heart sounds.  Exam reveals no gallop and no friction rub.   No murmur  heard. Pulmonary/Chest: Effort normal and breath sounds normal. No respiratory distress. She has no wheezes. She has no rales. She exhibits no tenderness.  Abdominal: Soft. Bowel sounds are normal. She exhibits no distension and no mass. There is no tenderness. There is no rebound and no guarding.  Musculoskeletal: Normal range of motion. She exhibits no edema.  Neurological: She is alert and oriented to person, place, and time.  Skin: Skin is warm. No rash noted. No erythema.  Psychiatric: Affect and judgment normal.      LABORATORY PANEL:   CBC  Recent Labs Lab 01/27/15 1446  WBC 5.4  HGB 11.6*  HCT 33.4*  PLT 327   ------------------------------------------------------------------------------------------------------------------  Chemistries   Recent Labs Lab 01/27/15 1445 01/27/15 1446  NA  --  134*  K  --  3.4*  CL  --  97*  CO2  --  25  GLUCOSE  --  99  BUN  --  9  CREATININE  --  0.62  CALCIUM  --  9.2  AST 25  --   ALT 14  --   ALKPHOS 133*  --   BILITOT 0.7  --    ------------------------------------------------------------------------------------------------------------------  Cardiac Enzymes  Recent Labs Lab 01/27/15 1445  TROPONINI <0.03   ------------------------------------------------------------------------------------------------------------------  RADIOLOGY:  Dg Chest 2 View  01/27/2015  CLINICAL DATA:  Altered mental status beginning at Thanksgiving of this year, worsening. EXAM: CHEST  2 VIEW COMPARISON:  PA and lateral chest 01/13/2015 and 06/19/2013. FINDINGS: There is coarsening of the  pulmonary interstitium. No consolidative process, pneumothorax or effusion is identified. Heart size is normal. No focal bony abnormality. IMPRESSION: No acute disease. Electronically Signed   By: Inge Rise M.D.   On: 01/27/2015 15:39   Ct Head Wo Contrast  01/27/2015  CLINICAL DATA:  has been "declining" since her fall at Thanksgiving. They  think she has altered mental status - hearing things (knocks on the door at night), seeing things ( EXAM: CT HEAD WITHOUT CONTRAST TECHNIQUE: Contiguous axial images were obtained from the base of the skull through the vertex without intravenous contrast. COMPARISON:  07/31/2014 FINDINGS: Atherosclerotic and physiologic intracranial calcifications. Aneurysm clip near the proximal left MCA. Stable changes of previous left temporal craniotomy. Moderate parenchymal atrophy. Patchy areas of hypoattenuation in deep and periventricular white matter bilaterally. Negative for acute intracranial hemorrhage, mass lesion, acute infarction, midline shift, or mass-effect. Acute infarct may be inapparent on noncontrast CT. Ventricles and sulci symmetric. Bone windows demonstrate no focal lesion. IMPRESSION: 1.   1.  Negative for bleed or other acute intracranial process. 2. Atrophy and nonspecific white matter changes. 3. Stable postop changes of left aneurysm clipping. 2. Electronically Signed   By: Lucrezia Europe M.D.   On: 01/27/2015 16:17     ASSESSMENT AND PLAN:   81 year old female with early dementia, PAF and recent vertebral fracture who presented to the hospital with generalized weakness.  1. Generalized weakness: This is secondary to her vertebral fracture. She is unable to care for herself. Physical therapy has been consulted for  discharge planning. She would likely benefit from skilled nursing facility.  2. Vertebral compression fracture: Dr. Mack Guise of orthopedics has advised conservative management.  3. Essential hypertension: Continue losartan and metoprolol.  4. PAF: Continue metoprolol and amiodarone. She is on aspirin due to history of GI bleed  5. Chronic UTI: Continue Macrobid  Management plans discussed with the patient and she is in agreement.  CODE STATUS: FULL  TOTAL TIME TAKING CARE OF THIS PATIENT: 30 minutes.     POSSIBLE D/C tomorrow, DEPENDING ON CLINICAL CONDITION.   Marche Hottenstein,  Britlee Skolnik M.D on 01/28/2015 at 9:43 AM  Between 7am to 6pm - Pager - [phone] After 6pm go to [email or web address] - password [email or web address] [facility]  Office  [phone]  CC: Primary care physician; BABAOFF, Caryl Bis, MD  Note: This dictation was prepared with Dragon dictation along with smaller phrase technology. Any transcriptional errors that result from this process are unintentional.

## 2015-01-28 NOTE — Evaluation (Signed)
Physical Therapy Evaluation Patient Details Name: Hailey [name] MRN: [id number] DOB: [birth year] Today's Date: 01/28/2015   History of Present Illness  Pt admitted for complaints of fatigue. Pt with history of previous aneurysm clipping in 1998 along with dementia and HTN. Pt currently lives at independent living facility.  Pt with recent vertebral compression fracture and has chronic O2 at baseline for night.  Clinical Impression  Pt is a pleasant 81 year old female who was admitted for complaints of weakness. Pt performs bed mobility independently, transfers with mod I, and ambulation with cga and rw. Pt reports she usually just wears O2 at night, therefore mobility performed on room air. O2 sats decreased to 83%, further mobility deferred. Once O2 applied, sats improved to 97% with cues for pursed lip breathing and prolonged rest break. Recommend use of RW for continued ambulation to improve stabiltiy. Pt demonstrates deficits with strength/balance/endurance. Would benefit from skilled PT to address above deficits and promote optimal return to PLOF.      Follow Up Recommendations Home health PT    Equipment Recommendations       Recommendations for Other Services       Precautions / Restrictions Precautions Precautions: Fall Restrictions Weight Bearing Restrictions: No      Mobility  Bed Mobility Overal bed mobility: Modified Independent             General bed mobility comments: uses bedrail for mobility. Safe technique performed  Transfers Overall transfer level: Needs assistance Equipment used: Rolling walker (2 wheeled) Transfers: Sit to/from Stand Sit to Stand: Supervision         General transfer comment: safe technique performed. No AD used initially, however therapist advised pt to use for increased stability.  Ambulation/Gait Ambulation/Gait assistance: Min guard Ambulation Distance (Feet): 20 Feet Assistive device: Rolling walker (2 wheeled) Gait  Pattern/deviations: Step-through pattern     General Gait Details: ambulated in room to [facility] and then around room to recliner. Safe technique performed. Ambulation distance limited to decreased O2 sats. RW used for [employer], [employer] (Stroke Patients Only)       Balance Overall balance assessment: History of Falls (has had 5 in past year)                                           Pertinent Vitals/Pain Pain Assessment: No/denies pain    Home Living Family/patient expects to be discharged to::  (independent living facility)                      Prior Function Level of Independence: Independent with assistive device(s)         Comments: household ambulator'     Hand Dominance        Extremity/Trunk Assessment   Upper Extremity Assessment: Overall WFL for tasks assessed           Lower Extremity Assessment:  (grossly 4/5; symmeterical L vs R side)         Communication   Communication: No difficulties  Cognition Arousal/Alertness: Awake/alert Behavior During Therapy: WFL for tasks assessed/performed Overall Cognitive Status: Within Functional Limits for tasks assessed                      General Comments  Exercises Other Exercises Other Exercises: ambulated to [facility], required cga for assistance and hygiene.       Assessment/Plan    PT Assessment Patient needs continued PT services  PT Diagnosis Difficulty walking;Generalized weakness   PT Problem List Decreased strength;Decreased activity tolerance;Decreased balance;Decreased mobility;Decreased knowledge of use of DME  PT Treatment Interventions Gait training;Therapeutic exercise   PT Goals (Current goals can be found in the Care Plan section) Acute Rehab PT Goals Patient Stated Goal: to get stronger PT Goal Formulation: With patient Time For Goal Achievement: 02/11/15 Potential to Achieve Goals:  Good    Frequency Min 2X/week   Barriers to discharge        Co-evaluation               End of Session Equipment Utilized During Treatment: Gait belt;Oxygen Activity Tolerance: Patient tolerated treatment well Patient left: in chair;with chair alarm set Nurse Communication: Mobility status         Time: [id number] PT Time Calculation (min) (ACUTE ONLY): 24 min   Charges:   PT Evaluation $Initial PT Evaluation Tier I: 1 Procedure PT Treatments $Therapeutic Activity: 8-22 mins   PT G Codes:        Kitrina Maurin Feb 03, 2015, 11:06 AM  Greggory Stallion, PT, DPT [phone]

## 2015-01-28 NOTE — Care Management Note (Signed)
Case Management Note  Patient Details  Name: Hailey [name] MRN: [id number] Date of Birth: October 17, 1932  Subjective/Objective:    CM consult for discharge planning. Spoke with daughter, [name],  by phone. Patient lives at [facility]. She is provided with 3 meals per day. Uses a cane and walker. Has chronic home O2 through [facility]. Daughter is agreeable to home health services. Prefers [facility]. Referral called to [facility] for SN, PT and HHA. No additional needs identified. Case closed.                 Action/Plan:   Expected Discharge Date:                  Expected Discharge Plan:  [place]  In-House Referral:     Discharge planning Services  CM Consult  Post Acute Care Choice:  Home Health Choice offered to:  Adult Children  DME Arranged:    DME Agency:     HH Arranged:  RN, PT, Nurse's Aide [place] Agency:  [facility]  Status of Service:  Completed, signed off  Medicare Important Message Given:    Date Medicare IM Given:    Medicare IM give by:    Date Additional Medicare IM Given:    Additional Medicare Important Message give by:     If discussed at [place] of Stay Meetings, dates discussed:    Additional Comments:  Jolly Mango, RN 01/28/2015, 10:34 AM

## 2015-01-28 NOTE — Progress Notes (Signed)
Order from MD to discharge patient to home today with home health, son at the bedside, home medications reviewed with patient, portable oxygen delivered at hospital for patient. Oxygen at 2L per [facility].  Patient verbalized understanding of discharge instructions given. Discharge via wheelchair.

## 2015-01-30 DIAGNOSIS — F0390 Unspecified dementia without behavioral disturbance: Secondary | ICD-10-CM | POA: Diagnosis not present

## 2015-01-30 DIAGNOSIS — S32009D Unspecified fracture of unspecified lumbar vertebra, subsequent encounter for fracture with routine healing: Secondary | ICD-10-CM | POA: Diagnosis not present

## 2015-01-30 DIAGNOSIS — I110 Hypertensive heart disease with heart failure: Secondary | ICD-10-CM | POA: Diagnosis not present

## 2015-01-30 DIAGNOSIS — I509 Heart failure, unspecified: Secondary | ICD-10-CM | POA: Diagnosis not present

## 2015-01-30 DIAGNOSIS — I4891 Unspecified atrial fibrillation: Secondary | ICD-10-CM | POA: Diagnosis not present

## 2015-01-30 DIAGNOSIS — K219 Gastro-esophageal reflux disease without esophagitis: Secondary | ICD-10-CM | POA: Diagnosis not present

## 2015-01-30 DIAGNOSIS — J449 Chronic obstructive pulmonary disease, unspecified: Secondary | ICD-10-CM | POA: Diagnosis not present

## 2015-01-30 DIAGNOSIS — J45909 Unspecified asthma, uncomplicated: Secondary | ICD-10-CM | POA: Diagnosis not present

## 2015-01-30 DIAGNOSIS — G4733 Obstructive sleep apnea (adult) (pediatric): Secondary | ICD-10-CM | POA: Diagnosis not present

## 2015-02-02 DIAGNOSIS — G4733 Obstructive sleep apnea (adult) (pediatric): Secondary | ICD-10-CM | POA: Diagnosis not present

## 2015-02-02 DIAGNOSIS — I509 Heart failure, unspecified: Secondary | ICD-10-CM | POA: Diagnosis not present

## 2015-02-02 DIAGNOSIS — K219 Gastro-esophageal reflux disease without esophagitis: Secondary | ICD-10-CM | POA: Diagnosis not present

## 2015-02-02 DIAGNOSIS — I4891 Unspecified atrial fibrillation: Secondary | ICD-10-CM | POA: Diagnosis not present

## 2015-02-02 DIAGNOSIS — J449 Chronic obstructive pulmonary disease, unspecified: Secondary | ICD-10-CM | POA: Diagnosis not present

## 2015-02-02 DIAGNOSIS — J45909 Unspecified asthma, uncomplicated: Secondary | ICD-10-CM | POA: Diagnosis not present

## 2015-02-02 DIAGNOSIS — F0390 Unspecified dementia without behavioral disturbance: Secondary | ICD-10-CM | POA: Diagnosis not present

## 2015-02-02 DIAGNOSIS — I110 Hypertensive heart disease with heart failure: Secondary | ICD-10-CM | POA: Diagnosis not present

## 2015-02-02 DIAGNOSIS — S32009D Unspecified fracture of unspecified lumbar vertebra, subsequent encounter for fracture with routine healing: Secondary | ICD-10-CM | POA: Diagnosis not present

## 2015-02-03 DIAGNOSIS — J449 Chronic obstructive pulmonary disease, unspecified: Secondary | ICD-10-CM | POA: Diagnosis not present

## 2015-02-03 DIAGNOSIS — K219 Gastro-esophageal reflux disease without esophagitis: Secondary | ICD-10-CM | POA: Diagnosis not present

## 2015-02-03 DIAGNOSIS — J45909 Unspecified asthma, uncomplicated: Secondary | ICD-10-CM | POA: Diagnosis not present

## 2015-02-03 DIAGNOSIS — F0390 Unspecified dementia without behavioral disturbance: Secondary | ICD-10-CM | POA: Diagnosis not present

## 2015-02-03 DIAGNOSIS — I4891 Unspecified atrial fibrillation: Secondary | ICD-10-CM | POA: Diagnosis not present

## 2015-02-03 DIAGNOSIS — G4733 Obstructive sleep apnea (adult) (pediatric): Secondary | ICD-10-CM | POA: Diagnosis not present

## 2015-02-03 DIAGNOSIS — I110 Hypertensive heart disease with heart failure: Secondary | ICD-10-CM | POA: Diagnosis not present

## 2015-02-03 DIAGNOSIS — S32009D Unspecified fracture of unspecified lumbar vertebra, subsequent encounter for fracture with routine healing: Secondary | ICD-10-CM | POA: Diagnosis not present

## 2015-02-03 DIAGNOSIS — I509 Heart failure, unspecified: Secondary | ICD-10-CM | POA: Diagnosis not present

## 2015-02-04 DIAGNOSIS — I4891 Unspecified atrial fibrillation: Secondary | ICD-10-CM | POA: Diagnosis not present

## 2015-02-04 DIAGNOSIS — J449 Chronic obstructive pulmonary disease, unspecified: Secondary | ICD-10-CM | POA: Diagnosis not present

## 2015-02-04 DIAGNOSIS — G4733 Obstructive sleep apnea (adult) (pediatric): Secondary | ICD-10-CM | POA: Diagnosis not present

## 2015-02-04 DIAGNOSIS — F0390 Unspecified dementia without behavioral disturbance: Secondary | ICD-10-CM | POA: Diagnosis not present

## 2015-02-04 DIAGNOSIS — S32009D Unspecified fracture of unspecified lumbar vertebra, subsequent encounter for fracture with routine healing: Secondary | ICD-10-CM | POA: Diagnosis not present

## 2015-02-04 DIAGNOSIS — I509 Heart failure, unspecified: Secondary | ICD-10-CM | POA: Diagnosis not present

## 2015-02-04 DIAGNOSIS — J45909 Unspecified asthma, uncomplicated: Secondary | ICD-10-CM | POA: Diagnosis not present

## 2015-02-04 DIAGNOSIS — K219 Gastro-esophageal reflux disease without esophagitis: Secondary | ICD-10-CM | POA: Diagnosis not present

## 2015-02-04 DIAGNOSIS — I110 Hypertensive heart disease with heart failure: Secondary | ICD-10-CM | POA: Diagnosis not present

## 2015-02-05 DIAGNOSIS — G4733 Obstructive sleep apnea (adult) (pediatric): Secondary | ICD-10-CM | POA: Diagnosis not present

## 2015-02-05 DIAGNOSIS — F0390 Unspecified dementia without behavioral disturbance: Secondary | ICD-10-CM | POA: Diagnosis not present

## 2015-02-05 DIAGNOSIS — J45909 Unspecified asthma, uncomplicated: Secondary | ICD-10-CM | POA: Diagnosis not present

## 2015-02-05 DIAGNOSIS — I509 Heart failure, unspecified: Secondary | ICD-10-CM | POA: Diagnosis not present

## 2015-02-05 DIAGNOSIS — S32009D Unspecified fracture of unspecified lumbar vertebra, subsequent encounter for fracture with routine healing: Secondary | ICD-10-CM | POA: Diagnosis not present

## 2015-02-05 DIAGNOSIS — K219 Gastro-esophageal reflux disease without esophagitis: Secondary | ICD-10-CM | POA: Diagnosis not present

## 2015-02-05 DIAGNOSIS — J449 Chronic obstructive pulmonary disease, unspecified: Secondary | ICD-10-CM | POA: Diagnosis not present

## 2015-02-05 DIAGNOSIS — I4891 Unspecified atrial fibrillation: Secondary | ICD-10-CM | POA: Diagnosis not present

## 2015-02-05 DIAGNOSIS — I110 Hypertensive heart disease with heart failure: Secondary | ICD-10-CM | POA: Diagnosis not present

## 2015-02-07 DIAGNOSIS — Z96659 Presence of unspecified artificial knee joint: Secondary | ICD-10-CM | POA: Diagnosis not present

## 2015-02-07 DIAGNOSIS — R0689 Other abnormalities of breathing: Secondary | ICD-10-CM | POA: Diagnosis not present

## 2015-02-07 DIAGNOSIS — M5136 Other intervertebral disc degeneration, lumbar region: Secondary | ICD-10-CM | POA: Diagnosis not present

## 2015-02-07 DIAGNOSIS — J449 Chronic obstructive pulmonary disease, unspecified: Secondary | ICD-10-CM | POA: Diagnosis not present

## 2015-02-09 DIAGNOSIS — K219 Gastro-esophageal reflux disease without esophagitis: Secondary | ICD-10-CM | POA: Diagnosis not present

## 2015-02-09 DIAGNOSIS — J45909 Unspecified asthma, uncomplicated: Secondary | ICD-10-CM | POA: Diagnosis not present

## 2015-02-09 DIAGNOSIS — I110 Hypertensive heart disease with heart failure: Secondary | ICD-10-CM | POA: Diagnosis not present

## 2015-02-09 DIAGNOSIS — F0390 Unspecified dementia without behavioral disturbance: Secondary | ICD-10-CM | POA: Diagnosis not present

## 2015-02-09 DIAGNOSIS — I509 Heart failure, unspecified: Secondary | ICD-10-CM | POA: Diagnosis not present

## 2015-02-09 DIAGNOSIS — S32009D Unspecified fracture of unspecified lumbar vertebra, subsequent encounter for fracture with routine healing: Secondary | ICD-10-CM | POA: Diagnosis not present

## 2015-02-09 DIAGNOSIS — J449 Chronic obstructive pulmonary disease, unspecified: Secondary | ICD-10-CM | POA: Diagnosis not present

## 2015-02-09 DIAGNOSIS — I4891 Unspecified atrial fibrillation: Secondary | ICD-10-CM | POA: Diagnosis not present

## 2015-02-09 DIAGNOSIS — G4733 Obstructive sleep apnea (adult) (pediatric): Secondary | ICD-10-CM | POA: Diagnosis not present

## 2015-02-10 DIAGNOSIS — J449 Chronic obstructive pulmonary disease, unspecified: Secondary | ICD-10-CM | POA: Diagnosis not present

## 2015-02-10 DIAGNOSIS — I509 Heart failure, unspecified: Secondary | ICD-10-CM | POA: Diagnosis not present

## 2015-02-10 DIAGNOSIS — K219 Gastro-esophageal reflux disease without esophagitis: Secondary | ICD-10-CM | POA: Diagnosis not present

## 2015-02-10 DIAGNOSIS — I110 Hypertensive heart disease with heart failure: Secondary | ICD-10-CM | POA: Diagnosis not present

## 2015-02-10 DIAGNOSIS — I4891 Unspecified atrial fibrillation: Secondary | ICD-10-CM | POA: Diagnosis not present

## 2015-02-10 DIAGNOSIS — J45909 Unspecified asthma, uncomplicated: Secondary | ICD-10-CM | POA: Diagnosis not present

## 2015-02-10 DIAGNOSIS — F0390 Unspecified dementia without behavioral disturbance: Secondary | ICD-10-CM | POA: Diagnosis not present

## 2015-02-10 DIAGNOSIS — G4733 Obstructive sleep apnea (adult) (pediatric): Secondary | ICD-10-CM | POA: Diagnosis not present

## 2015-02-10 DIAGNOSIS — S32009D Unspecified fracture of unspecified lumbar vertebra, subsequent encounter for fracture with routine healing: Secondary | ICD-10-CM | POA: Diagnosis not present

## 2015-02-11 DIAGNOSIS — J449 Chronic obstructive pulmonary disease, unspecified: Secondary | ICD-10-CM | POA: Diagnosis not present

## 2015-02-11 DIAGNOSIS — I110 Hypertensive heart disease with heart failure: Secondary | ICD-10-CM | POA: Diagnosis not present

## 2015-02-11 DIAGNOSIS — I4891 Unspecified atrial fibrillation: Secondary | ICD-10-CM | POA: Diagnosis not present

## 2015-02-11 DIAGNOSIS — J45909 Unspecified asthma, uncomplicated: Secondary | ICD-10-CM | POA: Diagnosis not present

## 2015-02-11 DIAGNOSIS — K219 Gastro-esophageal reflux disease without esophagitis: Secondary | ICD-10-CM | POA: Diagnosis not present

## 2015-02-11 DIAGNOSIS — G4733 Obstructive sleep apnea (adult) (pediatric): Secondary | ICD-10-CM | POA: Diagnosis not present

## 2015-02-11 DIAGNOSIS — F0390 Unspecified dementia without behavioral disturbance: Secondary | ICD-10-CM | POA: Diagnosis not present

## 2015-02-11 DIAGNOSIS — S32009D Unspecified fracture of unspecified lumbar vertebra, subsequent encounter for fracture with routine healing: Secondary | ICD-10-CM | POA: Diagnosis not present

## 2015-02-11 DIAGNOSIS — I509 Heart failure, unspecified: Secondary | ICD-10-CM | POA: Diagnosis not present

## 2015-02-13 DIAGNOSIS — I110 Hypertensive heart disease with heart failure: Secondary | ICD-10-CM | POA: Diagnosis not present

## 2015-02-13 DIAGNOSIS — G4733 Obstructive sleep apnea (adult) (pediatric): Secondary | ICD-10-CM | POA: Diagnosis not present

## 2015-02-13 DIAGNOSIS — K219 Gastro-esophageal reflux disease without esophagitis: Secondary | ICD-10-CM | POA: Diagnosis not present

## 2015-02-13 DIAGNOSIS — S32009D Unspecified fracture of unspecified lumbar vertebra, subsequent encounter for fracture with routine healing: Secondary | ICD-10-CM | POA: Diagnosis not present

## 2015-02-13 DIAGNOSIS — I4891 Unspecified atrial fibrillation: Secondary | ICD-10-CM | POA: Diagnosis not present

## 2015-02-13 DIAGNOSIS — J449 Chronic obstructive pulmonary disease, unspecified: Secondary | ICD-10-CM | POA: Diagnosis not present

## 2015-02-13 DIAGNOSIS — J45909 Unspecified asthma, uncomplicated: Secondary | ICD-10-CM | POA: Diagnosis not present

## 2015-02-13 DIAGNOSIS — F0390 Unspecified dementia without behavioral disturbance: Secondary | ICD-10-CM | POA: Diagnosis not present

## 2015-02-13 DIAGNOSIS — I509 Heart failure, unspecified: Secondary | ICD-10-CM | POA: Diagnosis not present

## 2015-02-17 DIAGNOSIS — J45909 Unspecified asthma, uncomplicated: Secondary | ICD-10-CM | POA: Diagnosis not present

## 2015-02-17 DIAGNOSIS — I110 Hypertensive heart disease with heart failure: Secondary | ICD-10-CM | POA: Diagnosis not present

## 2015-02-17 DIAGNOSIS — F0390 Unspecified dementia without behavioral disturbance: Secondary | ICD-10-CM | POA: Diagnosis not present

## 2015-02-17 DIAGNOSIS — I4891 Unspecified atrial fibrillation: Secondary | ICD-10-CM | POA: Diagnosis not present

## 2015-02-17 DIAGNOSIS — K219 Gastro-esophageal reflux disease without esophagitis: Secondary | ICD-10-CM | POA: Diagnosis not present

## 2015-02-17 DIAGNOSIS — G4733 Obstructive sleep apnea (adult) (pediatric): Secondary | ICD-10-CM | POA: Diagnosis not present

## 2015-02-17 DIAGNOSIS — J449 Chronic obstructive pulmonary disease, unspecified: Secondary | ICD-10-CM | POA: Diagnosis not present

## 2015-02-17 DIAGNOSIS — S32009D Unspecified fracture of unspecified lumbar vertebra, subsequent encounter for fracture with routine healing: Secondary | ICD-10-CM | POA: Diagnosis not present

## 2015-02-17 DIAGNOSIS — I509 Heart failure, unspecified: Secondary | ICD-10-CM | POA: Diagnosis not present

## 2015-02-18 DIAGNOSIS — F0390 Unspecified dementia without behavioral disturbance: Secondary | ICD-10-CM | POA: Diagnosis not present

## 2015-02-18 DIAGNOSIS — I110 Hypertensive heart disease with heart failure: Secondary | ICD-10-CM | POA: Diagnosis not present

## 2015-02-18 DIAGNOSIS — J449 Chronic obstructive pulmonary disease, unspecified: Secondary | ICD-10-CM | POA: Diagnosis not present

## 2015-02-18 DIAGNOSIS — K219 Gastro-esophageal reflux disease without esophagitis: Secondary | ICD-10-CM | POA: Diagnosis not present

## 2015-02-18 DIAGNOSIS — I4891 Unspecified atrial fibrillation: Secondary | ICD-10-CM | POA: Diagnosis not present

## 2015-02-18 DIAGNOSIS — J45909 Unspecified asthma, uncomplicated: Secondary | ICD-10-CM | POA: Diagnosis not present

## 2015-02-18 DIAGNOSIS — I509 Heart failure, unspecified: Secondary | ICD-10-CM | POA: Diagnosis not present

## 2015-02-18 DIAGNOSIS — S32009D Unspecified fracture of unspecified lumbar vertebra, subsequent encounter for fracture with routine healing: Secondary | ICD-10-CM | POA: Diagnosis not present

## 2015-02-18 DIAGNOSIS — G4733 Obstructive sleep apnea (adult) (pediatric): Secondary | ICD-10-CM | POA: Diagnosis not present

## 2015-02-20 DIAGNOSIS — J45909 Unspecified asthma, uncomplicated: Secondary | ICD-10-CM | POA: Diagnosis not present

## 2015-02-20 DIAGNOSIS — I4891 Unspecified atrial fibrillation: Secondary | ICD-10-CM | POA: Diagnosis not present

## 2015-02-20 DIAGNOSIS — I509 Heart failure, unspecified: Secondary | ICD-10-CM | POA: Diagnosis not present

## 2015-02-20 DIAGNOSIS — F0390 Unspecified dementia without behavioral disturbance: Secondary | ICD-10-CM | POA: Diagnosis not present

## 2015-02-20 DIAGNOSIS — I110 Hypertensive heart disease with heart failure: Secondary | ICD-10-CM | POA: Diagnosis not present

## 2015-02-20 DIAGNOSIS — S32009D Unspecified fracture of unspecified lumbar vertebra, subsequent encounter for fracture with routine healing: Secondary | ICD-10-CM | POA: Diagnosis not present

## 2015-02-20 DIAGNOSIS — J449 Chronic obstructive pulmonary disease, unspecified: Secondary | ICD-10-CM | POA: Diagnosis not present

## 2015-02-20 DIAGNOSIS — K219 Gastro-esophageal reflux disease without esophagitis: Secondary | ICD-10-CM | POA: Diagnosis not present

## 2015-02-20 DIAGNOSIS — G4733 Obstructive sleep apnea (adult) (pediatric): Secondary | ICD-10-CM | POA: Diagnosis not present

## 2015-02-23 DIAGNOSIS — G4733 Obstructive sleep apnea (adult) (pediatric): Secondary | ICD-10-CM | POA: Diagnosis not present

## 2015-02-23 DIAGNOSIS — S32009D Unspecified fracture of unspecified lumbar vertebra, subsequent encounter for fracture with routine healing: Secondary | ICD-10-CM | POA: Diagnosis not present

## 2015-02-23 DIAGNOSIS — I4891 Unspecified atrial fibrillation: Secondary | ICD-10-CM | POA: Diagnosis not present

## 2015-02-23 DIAGNOSIS — F0390 Unspecified dementia without behavioral disturbance: Secondary | ICD-10-CM | POA: Diagnosis not present

## 2015-02-23 DIAGNOSIS — K219 Gastro-esophageal reflux disease without esophagitis: Secondary | ICD-10-CM | POA: Diagnosis not present

## 2015-02-23 DIAGNOSIS — I110 Hypertensive heart disease with heart failure: Secondary | ICD-10-CM | POA: Diagnosis not present

## 2015-02-23 DIAGNOSIS — J45909 Unspecified asthma, uncomplicated: Secondary | ICD-10-CM | POA: Diagnosis not present

## 2015-02-23 DIAGNOSIS — I509 Heart failure, unspecified: Secondary | ICD-10-CM | POA: Diagnosis not present

## 2015-02-23 DIAGNOSIS — J449 Chronic obstructive pulmonary disease, unspecified: Secondary | ICD-10-CM | POA: Diagnosis not present

## 2015-02-24 DIAGNOSIS — I509 Heart failure, unspecified: Secondary | ICD-10-CM | POA: Diagnosis not present

## 2015-02-24 DIAGNOSIS — I110 Hypertensive heart disease with heart failure: Secondary | ICD-10-CM | POA: Diagnosis not present

## 2015-02-24 DIAGNOSIS — J449 Chronic obstructive pulmonary disease, unspecified: Secondary | ICD-10-CM | POA: Diagnosis not present

## 2015-02-24 DIAGNOSIS — S32009D Unspecified fracture of unspecified lumbar vertebra, subsequent encounter for fracture with routine healing: Secondary | ICD-10-CM | POA: Diagnosis not present

## 2015-02-24 DIAGNOSIS — K219 Gastro-esophageal reflux disease without esophagitis: Secondary | ICD-10-CM | POA: Diagnosis not present

## 2015-02-24 DIAGNOSIS — G4733 Obstructive sleep apnea (adult) (pediatric): Secondary | ICD-10-CM | POA: Diagnosis not present

## 2015-02-24 DIAGNOSIS — I4891 Unspecified atrial fibrillation: Secondary | ICD-10-CM | POA: Diagnosis not present

## 2015-02-24 DIAGNOSIS — F0390 Unspecified dementia without behavioral disturbance: Secondary | ICD-10-CM | POA: Diagnosis not present

## 2015-02-24 DIAGNOSIS — J45909 Unspecified asthma, uncomplicated: Secondary | ICD-10-CM | POA: Diagnosis not present

## 2015-02-25 DIAGNOSIS — S22080A Wedge compression fracture of T11-T12 vertebra, initial encounter for closed fracture: Secondary | ICD-10-CM | POA: Diagnosis not present

## 2015-02-25 DIAGNOSIS — M5136 Other intervertebral disc degeneration, lumbar region: Secondary | ICD-10-CM | POA: Diagnosis not present

## 2015-03-01 DIAGNOSIS — I509 Heart failure, unspecified: Secondary | ICD-10-CM | POA: Diagnosis not present

## 2015-03-01 DIAGNOSIS — J45909 Unspecified asthma, uncomplicated: Secondary | ICD-10-CM | POA: Diagnosis not present

## 2015-03-01 DIAGNOSIS — F0390 Unspecified dementia without behavioral disturbance: Secondary | ICD-10-CM | POA: Diagnosis not present

## 2015-03-01 DIAGNOSIS — I110 Hypertensive heart disease with heart failure: Secondary | ICD-10-CM | POA: Diagnosis not present

## 2015-03-01 DIAGNOSIS — I4891 Unspecified atrial fibrillation: Secondary | ICD-10-CM | POA: Diagnosis not present

## 2015-03-01 DIAGNOSIS — S32009D Unspecified fracture of unspecified lumbar vertebra, subsequent encounter for fracture with routine healing: Secondary | ICD-10-CM | POA: Diagnosis not present

## 2015-03-01 DIAGNOSIS — K219 Gastro-esophageal reflux disease without esophagitis: Secondary | ICD-10-CM | POA: Diagnosis not present

## 2015-03-01 DIAGNOSIS — J449 Chronic obstructive pulmonary disease, unspecified: Secondary | ICD-10-CM | POA: Diagnosis not present

## 2015-03-01 DIAGNOSIS — G4733 Obstructive sleep apnea (adult) (pediatric): Secondary | ICD-10-CM | POA: Diagnosis not present

## 2015-03-02 DIAGNOSIS — I509 Heart failure, unspecified: Secondary | ICD-10-CM | POA: Diagnosis not present

## 2015-03-02 DIAGNOSIS — G4733 Obstructive sleep apnea (adult) (pediatric): Secondary | ICD-10-CM | POA: Diagnosis not present

## 2015-03-02 DIAGNOSIS — S32009D Unspecified fracture of unspecified lumbar vertebra, subsequent encounter for fracture with routine healing: Secondary | ICD-10-CM | POA: Diagnosis not present

## 2015-03-02 DIAGNOSIS — I110 Hypertensive heart disease with heart failure: Secondary | ICD-10-CM | POA: Diagnosis not present

## 2015-03-02 DIAGNOSIS — F0390 Unspecified dementia without behavioral disturbance: Secondary | ICD-10-CM | POA: Diagnosis not present

## 2015-03-02 DIAGNOSIS — J449 Chronic obstructive pulmonary disease, unspecified: Secondary | ICD-10-CM | POA: Diagnosis not present

## 2015-03-02 DIAGNOSIS — I4891 Unspecified atrial fibrillation: Secondary | ICD-10-CM | POA: Diagnosis not present

## 2015-03-02 DIAGNOSIS — J45909 Unspecified asthma, uncomplicated: Secondary | ICD-10-CM | POA: Diagnosis not present

## 2015-03-02 DIAGNOSIS — K219 Gastro-esophageal reflux disease without esophagitis: Secondary | ICD-10-CM | POA: Diagnosis not present

## 2015-03-09 DIAGNOSIS — J449 Chronic obstructive pulmonary disease, unspecified: Secondary | ICD-10-CM | POA: Diagnosis not present

## 2015-03-09 DIAGNOSIS — S32009D Unspecified fracture of unspecified lumbar vertebra, subsequent encounter for fracture with routine healing: Secondary | ICD-10-CM | POA: Diagnosis not present

## 2015-03-09 DIAGNOSIS — I509 Heart failure, unspecified: Secondary | ICD-10-CM | POA: Diagnosis not present

## 2015-03-09 DIAGNOSIS — I110 Hypertensive heart disease with heart failure: Secondary | ICD-10-CM | POA: Diagnosis not present

## 2015-03-09 DIAGNOSIS — I4891 Unspecified atrial fibrillation: Secondary | ICD-10-CM | POA: Diagnosis not present

## 2015-03-09 DIAGNOSIS — J45909 Unspecified asthma, uncomplicated: Secondary | ICD-10-CM | POA: Diagnosis not present

## 2015-03-09 DIAGNOSIS — K219 Gastro-esophageal reflux disease without esophagitis: Secondary | ICD-10-CM | POA: Diagnosis not present

## 2015-03-09 DIAGNOSIS — G4733 Obstructive sleep apnea (adult) (pediatric): Secondary | ICD-10-CM | POA: Diagnosis not present

## 2015-03-09 DIAGNOSIS — F0390 Unspecified dementia without behavioral disturbance: Secondary | ICD-10-CM | POA: Diagnosis not present

## 2015-03-10 DIAGNOSIS — I509 Heart failure, unspecified: Secondary | ICD-10-CM | POA: Diagnosis not present

## 2015-03-10 DIAGNOSIS — K219 Gastro-esophageal reflux disease without esophagitis: Secondary | ICD-10-CM | POA: Diagnosis not present

## 2015-03-10 DIAGNOSIS — Z96659 Presence of unspecified artificial knee joint: Secondary | ICD-10-CM | POA: Diagnosis not present

## 2015-03-10 DIAGNOSIS — M5136 Other intervertebral disc degeneration, lumbar region: Secondary | ICD-10-CM | POA: Diagnosis not present

## 2015-03-10 DIAGNOSIS — R0689 Other abnormalities of breathing: Secondary | ICD-10-CM | POA: Diagnosis not present

## 2015-03-10 DIAGNOSIS — I110 Hypertensive heart disease with heart failure: Secondary | ICD-10-CM | POA: Diagnosis not present

## 2015-03-10 DIAGNOSIS — J449 Chronic obstructive pulmonary disease, unspecified: Secondary | ICD-10-CM | POA: Diagnosis not present

## 2015-03-10 DIAGNOSIS — G4733 Obstructive sleep apnea (adult) (pediatric): Secondary | ICD-10-CM | POA: Diagnosis not present

## 2015-03-10 DIAGNOSIS — F0390 Unspecified dementia without behavioral disturbance: Secondary | ICD-10-CM | POA: Diagnosis not present

## 2015-03-10 DIAGNOSIS — I4891 Unspecified atrial fibrillation: Secondary | ICD-10-CM | POA: Diagnosis not present

## 2015-03-10 DIAGNOSIS — J45909 Unspecified asthma, uncomplicated: Secondary | ICD-10-CM | POA: Diagnosis not present

## 2015-03-10 DIAGNOSIS — S32009D Unspecified fracture of unspecified lumbar vertebra, subsequent encounter for fracture with routine healing: Secondary | ICD-10-CM | POA: Diagnosis not present

## 2015-03-17 DIAGNOSIS — L218 Other seborrheic dermatitis: Secondary | ICD-10-CM | POA: Diagnosis not present

## 2015-03-17 DIAGNOSIS — L538 Other specified erythematous conditions: Secondary | ICD-10-CM | POA: Diagnosis not present

## 2015-03-17 DIAGNOSIS — C8400 Mycosis fungoides, unspecified site: Secondary | ICD-10-CM | POA: Diagnosis not present

## 2015-03-17 DIAGNOSIS — L853 Xerosis cutis: Secondary | ICD-10-CM | POA: Diagnosis not present

## 2015-03-17 DIAGNOSIS — L820 Inflamed seborrheic keratosis: Secondary | ICD-10-CM | POA: Diagnosis not present

## 2015-04-10 DIAGNOSIS — M5136 Other intervertebral disc degeneration, lumbar region: Secondary | ICD-10-CM | POA: Diagnosis not present

## 2015-04-10 DIAGNOSIS — J449 Chronic obstructive pulmonary disease, unspecified: Secondary | ICD-10-CM | POA: Diagnosis not present

## 2015-04-10 DIAGNOSIS — Z96659 Presence of unspecified artificial knee joint: Secondary | ICD-10-CM | POA: Diagnosis not present

## 2015-04-10 DIAGNOSIS — R0689 Other abnormalities of breathing: Secondary | ICD-10-CM | POA: Diagnosis not present

## 2015-04-15 ENCOUNTER — Other Ambulatory Visit: Payer: Self-pay | Admitting: Cardiovascular Disease

## 2015-04-15 ENCOUNTER — Other Ambulatory Visit: Payer: Self-pay | Admitting: Urology

## 2015-04-17 NOTE — Telephone Encounter (Signed)
Spoke with pt in reference to abx. Pt stated she is no longer needing abx, she received them from another physician. Reinforced with pt if she needs more she will need an office visit to see [facility]. Pt voiced understanding.

## 2015-05-08 DIAGNOSIS — Z96659 Presence of unspecified artificial knee joint: Secondary | ICD-10-CM | POA: Diagnosis not present

## 2015-05-08 DIAGNOSIS — M5136 Other intervertebral disc degeneration, lumbar region: Secondary | ICD-10-CM | POA: Diagnosis not present

## 2015-05-08 DIAGNOSIS — R0689 Other abnormalities of breathing: Secondary | ICD-10-CM | POA: Diagnosis not present

## 2015-05-08 DIAGNOSIS — J449 Chronic obstructive pulmonary disease, unspecified: Secondary | ICD-10-CM | POA: Diagnosis not present

## 2015-05-19 ENCOUNTER — Emergency Department: Payer: Commercial Managed Care - HMO

## 2015-05-19 ENCOUNTER — Inpatient Hospital Stay
Admission: EM | Admit: 2015-05-19 | Discharge: 2015-05-25 | DRG: 190 | Disposition: A | Discharge status: Home Health | Payer: Commercial Managed Care - HMO | Attending: Internal Medicine | Admitting: Internal Medicine

## 2015-05-19 DIAGNOSIS — Z66 Do not resuscitate: Secondary | ICD-10-CM | POA: Diagnosis present

## 2015-05-19 DIAGNOSIS — Z8249 Family history of ischemic heart disease and other diseases of the circulatory system: Secondary | ICD-10-CM

## 2015-05-19 DIAGNOSIS — M109 Gout, unspecified: Secondary | ICD-10-CM | POA: Diagnosis present

## 2015-05-19 DIAGNOSIS — J45909 Unspecified asthma, uncomplicated: Secondary | ICD-10-CM | POA: Diagnosis present

## 2015-05-19 DIAGNOSIS — Z8701 Personal history of pneumonia (recurrent): Secondary | ICD-10-CM | POA: Diagnosis not present

## 2015-05-19 DIAGNOSIS — G20 Parkinson's disease: Secondary | ICD-10-CM | POA: Diagnosis present

## 2015-05-19 DIAGNOSIS — D649 Anemia, unspecified: Secondary | ICD-10-CM | POA: Diagnosis present

## 2015-05-19 DIAGNOSIS — Z87891 Personal history of nicotine dependence: Secondary | ICD-10-CM | POA: Diagnosis not present

## 2015-05-19 DIAGNOSIS — M1990 Unspecified osteoarthritis, unspecified site: Secondary | ICD-10-CM | POA: Diagnosis present

## 2015-05-19 DIAGNOSIS — I422 Other hypertrophic cardiomyopathy: Secondary | ICD-10-CM | POA: Diagnosis present

## 2015-05-19 DIAGNOSIS — G8929 Other chronic pain: Secondary | ICD-10-CM | POA: Diagnosis present

## 2015-05-19 DIAGNOSIS — R251 Tremor, unspecified: Secondary | ICD-10-CM | POA: Diagnosis not present

## 2015-05-19 DIAGNOSIS — J449 Chronic obstructive pulmonary disease, unspecified: Secondary | ICD-10-CM | POA: Diagnosis present

## 2015-05-19 DIAGNOSIS — J9690 Respiratory failure, unspecified, unspecified whether with hypoxia or hypercapnia: Secondary | ICD-10-CM | POA: Diagnosis not present

## 2015-05-19 DIAGNOSIS — F419 Anxiety disorder, unspecified: Secondary | ICD-10-CM | POA: Diagnosis present

## 2015-05-19 DIAGNOSIS — I5032 Chronic diastolic (congestive) heart failure: Secondary | ICD-10-CM | POA: Diagnosis not present

## 2015-05-19 DIAGNOSIS — Z6831 Body mass index (BMI) 31.0-31.9, adult: Secondary | ICD-10-CM

## 2015-05-19 DIAGNOSIS — M797 Fibromyalgia: Secondary | ICD-10-CM | POA: Diagnosis present

## 2015-05-19 DIAGNOSIS — Z86711 Personal history of pulmonary embolism: Secondary | ICD-10-CM

## 2015-05-19 DIAGNOSIS — Z7951 Long term (current) use of inhaled steroids: Secondary | ICD-10-CM | POA: Diagnosis not present

## 2015-05-19 DIAGNOSIS — R296 Repeated falls: Secondary | ICD-10-CM | POA: Diagnosis present

## 2015-05-19 DIAGNOSIS — E785 Hyperlipidemia, unspecified: Secondary | ICD-10-CM | POA: Diagnosis present

## 2015-05-19 DIAGNOSIS — J9601 Acute respiratory failure with hypoxia: Secondary | ICD-10-CM | POA: Diagnosis present

## 2015-05-19 DIAGNOSIS — K219 Gastro-esophageal reflux disease without esophagitis: Secondary | ICD-10-CM | POA: Diagnosis present

## 2015-05-19 DIAGNOSIS — K449 Diaphragmatic hernia without obstruction or gangrene: Secondary | ICD-10-CM | POA: Diagnosis present

## 2015-05-19 DIAGNOSIS — Z86010 Personal history of colonic polyps: Secondary | ICD-10-CM

## 2015-05-19 DIAGNOSIS — G629 Polyneuropathy, unspecified: Secondary | ICD-10-CM | POA: Diagnosis present

## 2015-05-19 DIAGNOSIS — Z96652 Presence of left artificial knee joint: Secondary | ICD-10-CM | POA: Diagnosis present

## 2015-05-19 DIAGNOSIS — Z823 Family history of stroke: Secondary | ICD-10-CM | POA: Diagnosis not present

## 2015-05-19 DIAGNOSIS — Z8572 Personal history of non-Hodgkin lymphomas: Secondary | ICD-10-CM | POA: Diagnosis not present

## 2015-05-19 DIAGNOSIS — R4182 Altered mental status, unspecified: Secondary | ICD-10-CM | POA: Diagnosis present

## 2015-05-19 DIAGNOSIS — N649 Disorder of breast, unspecified: Secondary | ICD-10-CM | POA: Diagnosis present

## 2015-05-19 DIAGNOSIS — Z8711 Personal history of peptic ulcer disease: Secondary | ICD-10-CM | POA: Diagnosis not present

## 2015-05-19 DIAGNOSIS — G4733 Obstructive sleep apnea (adult) (pediatric): Secondary | ICD-10-CM | POA: Diagnosis present

## 2015-05-19 DIAGNOSIS — R0902 Hypoxemia: Secondary | ICD-10-CM | POA: Diagnosis not present

## 2015-05-19 DIAGNOSIS — J189 Pneumonia, unspecified organism: Secondary | ICD-10-CM | POA: Diagnosis not present

## 2015-05-19 DIAGNOSIS — Z888 Allergy status to other drugs, medicaments and biological substances status: Secondary | ICD-10-CM | POA: Diagnosis not present

## 2015-05-19 DIAGNOSIS — J440 Chronic obstructive pulmonary disease with acute lower respiratory infection: Secondary | ICD-10-CM | POA: Diagnosis not present

## 2015-05-19 DIAGNOSIS — Z86718 Personal history of other venous thrombosis and embolism: Secondary | ICD-10-CM

## 2015-05-19 DIAGNOSIS — J441 Chronic obstructive pulmonary disease with (acute) exacerbation: Secondary | ICD-10-CM | POA: Diagnosis not present

## 2015-05-19 DIAGNOSIS — E669 Obesity, unspecified: Secondary | ICD-10-CM | POA: Diagnosis present

## 2015-05-19 DIAGNOSIS — K5900 Constipation, unspecified: Secondary | ICD-10-CM | POA: Diagnosis present

## 2015-05-19 DIAGNOSIS — R0602 Shortness of breath: Secondary | ICD-10-CM | POA: Diagnosis not present

## 2015-05-19 DIAGNOSIS — B370 Candidal stomatitis: Secondary | ICD-10-CM | POA: Diagnosis present

## 2015-05-19 DIAGNOSIS — Z800 Family history of malignant neoplasm of digestive organs: Secondary | ICD-10-CM | POA: Diagnosis not present

## 2015-05-19 DIAGNOSIS — Z886 Allergy status to analgesic agent status: Secondary | ICD-10-CM | POA: Diagnosis not present

## 2015-05-19 DIAGNOSIS — G4700 Insomnia, unspecified: Secondary | ICD-10-CM | POA: Diagnosis not present

## 2015-05-19 DIAGNOSIS — G43909 Migraine, unspecified, not intractable, without status migrainosus: Secondary | ICD-10-CM | POA: Diagnosis present

## 2015-05-19 DIAGNOSIS — E876 Hypokalemia: Secondary | ICD-10-CM | POA: Diagnosis present

## 2015-05-19 DIAGNOSIS — I341 Nonrheumatic mitral (valve) prolapse: Secondary | ICD-10-CM | POA: Diagnosis present

## 2015-05-19 DIAGNOSIS — Z885 Allergy status to narcotic agent status: Secondary | ICD-10-CM | POA: Diagnosis not present

## 2015-05-19 DIAGNOSIS — I509 Heart failure, unspecified: Secondary | ICD-10-CM | POA: Diagnosis present

## 2015-05-19 DIAGNOSIS — Z79899 Other long term (current) drug therapy: Secondary | ICD-10-CM | POA: Diagnosis not present

## 2015-05-19 DIAGNOSIS — I110 Hypertensive heart disease with heart failure: Secondary | ICD-10-CM | POA: Diagnosis present

## 2015-05-19 DIAGNOSIS — M6281 Muscle weakness (generalized): Secondary | ICD-10-CM | POA: Diagnosis not present

## 2015-05-19 DIAGNOSIS — I480 Paroxysmal atrial fibrillation: Secondary | ICD-10-CM | POA: Diagnosis not present

## 2015-05-19 DIAGNOSIS — Z833 Family history of diabetes mellitus: Secondary | ICD-10-CM

## 2015-05-19 DIAGNOSIS — J181 Lobar pneumonia, unspecified organism: Secondary | ICD-10-CM | POA: Diagnosis not present

## 2015-05-19 LAB — CBC WITH DIFFERENTIAL/PLATELET
Basophils Absolute: 0 10*3/uL (ref 0–0.1)
Basophils Relative: 0 %
EOS ABS: 0 10*3/uL (ref 0–0.7)
Eosinophils Relative: 0 %
HCT: 32.8 % — ABNORMAL LOW (ref 35.0–47.0)
Hemoglobin: 11.1 g/dL — ABNORMAL LOW (ref 12.0–16.0)
LYMPHS ABS: 0.6 10*3/uL — AB (ref 1.0–3.6)
LYMPHS PCT: 4 %
MCH: 31.9 pg (ref 26.0–34.0)
MCHC: 33.9 g/dL (ref 32.0–36.0)
MCV: 94.1 fL (ref 80.0–100.0)
Monocytes Absolute: 1.3 10*3/uL — ABNORMAL HIGH (ref 0.2–0.9)
Monocytes Relative: 9 %
NEUTROS PCT: 87 %
Neutro Abs: 13.7 10*3/uL — ABNORMAL HIGH (ref 1.4–6.5)
PLATELETS: 188 10*3/uL (ref 150–440)
RBC: 3.48 MIL/uL — AB (ref 3.80–5.20)
RDW: 15 % — AB (ref 11.5–14.5)
WBC: 15.6 10*3/uL — AB (ref 3.6–11.0)

## 2015-05-19 LAB — BLOOD GAS, ARTERIAL
ALLENS TEST (PASS/FAIL): POSITIVE — AB
Acid-Base Excess: 3.4 mmol/L — ABNORMAL HIGH (ref 0.0–3.0)
BICARBONATE: 26.5 meq/L (ref 21.0–28.0)
FIO2: 1
O2 Saturation: 96 %
PATIENT TEMPERATURE: 37
PCO2 ART: 34 mmHg (ref 32.0–48.0)
PH ART: 7.5 — AB (ref 7.350–7.450)
PO2 ART: 74 mmHg — AB (ref 83.0–108.0)

## 2015-05-19 LAB — COMPREHENSIVE METABOLIC PANEL
ALT: 28 U/L (ref 14–54)
AST: 44 U/L — ABNORMAL HIGH (ref 15–41)
Albumin: 4.4 g/dL (ref 3.5–5.0)
Alkaline Phosphatase: 72 U/L (ref 38–126)
Anion gap: 10 (ref 5–15)
BUN: 16 mg/dL (ref 6–20)
CHLORIDE: 103 mmol/L (ref 101–111)
CO2: 25 mmol/L (ref 22–32)
CREATININE: 0.94 mg/dL (ref 0.44–1.00)
Calcium: 9.3 mg/dL (ref 8.9–10.3)
GFR, EST NON AFRICAN AMERICAN: 56 mL/min — AB (ref >60)
Glucose, Bld: 164 mg/dL — ABNORMAL HIGH (ref 65–99)
POTASSIUM: 3 mmol/L — AB (ref 3.5–5.1)
Sodium: 138 mmol/L (ref 135–145)
Total Bilirubin: 0.8 mg/dL (ref 0.3–1.2)
Total Protein: 7.3 g/dL (ref 6.5–8.1)

## 2015-05-19 LAB — TROPONIN I: Troponin I: 0.04 ng/mL — ABNORMAL HIGH (ref <0.031)

## 2015-05-19 LAB — URINALYSIS COMPLETE WITH MICROSCOPIC (ARMC ONLY)
BACTERIA UA: NONE SEEN
Bilirubin Urine: NEGATIVE
GLUCOSE, UA: NEGATIVE mg/dL
Ketones, ur: NEGATIVE mg/dL
Leukocytes, UA: NEGATIVE
Nitrite: NEGATIVE
PROTEIN: NEGATIVE mg/dL
SQUAMOUS EPITHELIAL / LPF: NONE SEEN
Specific Gravity, Urine: 1.012 (ref 1.005–1.030)
WBC UA: NONE SEEN WBC/hpf (ref 0–5)
pH: 5 (ref 5.0–8.0)

## 2015-05-19 LAB — MRSA PCR SCREENING: MRSA by PCR: NEGATIVE

## 2015-05-19 LAB — LACTIC ACID, PLASMA
LACTIC ACID, VENOUS: 2.8 mmol/L — AB (ref 0.5–2.0)
Lactic Acid, Venous: 2.9 mmol/L (ref 0.5–2.0)
Lactic Acid, Venous: 1.9 mmol/L (ref 0.5–2.0)

## 2015-05-19 LAB — RAPID INFLUENZA A&B ANTIGENS (ARMC ONLY)
INFLUENZA A (ARMC): NEGATIVE
INFLUENZA B (ARMC): NEGATIVE

## 2015-05-19 MED ORDER — VANCOMYCIN HCL IN DEXTROSE 1-5 GM/200ML-% IV SOLN
1000.0000 mg | Freq: Once | INTRAVENOUS | Status: AC
  Administered 2015-05-19: 1000 mg via INTRAVENOUS
  Filled 2015-05-19: qty 200

## 2015-05-19 MED ORDER — VANCOMYCIN HCL IN DEXTROSE 750-5 MG/150ML-% IV SOLN
750.0000 mg | Freq: Two times a day (BID) | INTRAVENOUS | Status: DC
  Administered 2015-05-19: 750 mg via INTRAVENOUS
  Filled 2015-05-19 (×3): qty 150

## 2015-05-19 MED ORDER — FAMOTIDINE IN NACL 20-0.9 MG/50ML-% IV SOLN
20.0000 mg | Freq: Two times a day (BID) | INTRAVENOUS | Status: DC
Start: 2015-05-19 — End: 2015-05-22
  Administered 2015-05-19 – 2015-05-22 (×6): 20 mg via INTRAVENOUS
  Filled 2015-05-19 (×8): qty 50

## 2015-05-19 MED ORDER — ENOXAPARIN SODIUM 40 MG/0.4ML ~~LOC~~ SOLN
40.0000 mg | SUBCUTANEOUS | Status: DC
  Administered 2015-05-20 – 2015-05-24 (×5): 40 mg via SUBCUTANEOUS
  Filled 2015-05-19 (×5): qty 0.4

## 2015-05-19 MED ORDER — SODIUM CHLORIDE 0.9% FLUSH: 3.0000 mL | INTRAVENOUS | Status: DC | PRN

## 2015-05-19 MED ORDER — POTASSIUM CHLORIDE 10 MEQ/100ML IV SOLN
10.0000 meq | INTRAVENOUS | Status: AC
  Administered 2015-05-19 (×4): 10 meq via INTRAVENOUS
  Filled 2015-05-19 (×4): qty 100

## 2015-05-19 MED ORDER — ACETAMINOPHEN 325 MG PO TABS
650.0000 mg | ORAL_TABLET | ORAL | Status: DC | PRN
  Administered 2015-05-20 (×2): 650 mg via ORAL
  Filled 2015-05-19 (×2): qty 2

## 2015-05-19 MED ORDER — SODIUM CHLORIDE 0.9 % IV SOLN: 250.0000 mL | INTRAVENOUS | Status: DC | PRN

## 2015-05-19 MED ORDER — BUDESONIDE 0.5 MG/2ML IN SUSP
0.5000 mg | Freq: Two times a day (BID) | RESPIRATORY_TRACT | Status: DC
  Administered 2015-05-19 – 2015-05-22 (×6): 0.5 mg via RESPIRATORY_TRACT
  Filled 2015-05-19 (×6): qty 2

## 2015-05-19 MED ORDER — METHYLPREDNISOLONE SODIUM SUCC 40 MG IJ SOLR
40.0000 mg | Freq: Two times a day (BID) | INTRAMUSCULAR | Status: DC
  Administered 2015-05-20 – 2015-05-22 (×5): 40 mg via INTRAVENOUS
  Filled 2015-05-19 (×5): qty 1

## 2015-05-19 MED ORDER — TIOTROPIUM BROMIDE MONOHYDRATE 18 MCG IN CAPS
18.0000 ug | ORAL_CAPSULE | Freq: Every day | RESPIRATORY_TRACT | Status: DC
  Administered 2015-05-20 – 2015-05-25 (×6): 18 ug via RESPIRATORY_TRACT
  Filled 2015-05-19 (×2): qty 5

## 2015-05-19 MED ORDER — MOMETASONE FURO-FORMOTEROL FUM 100-5 MCG/ACT IN AERO
2.0000 | INHALATION_SPRAY | Freq: Two times a day (BID) | RESPIRATORY_TRACT | Status: DC
  Administered 2015-05-19 – 2015-05-25 (×12): 2 via RESPIRATORY_TRACT
  Filled 2015-05-19: qty 8.8

## 2015-05-19 MED ORDER — SODIUM CHLORIDE 0.9% FLUSH
3.0000 mL | Freq: Two times a day (BID) | INTRAVENOUS | Status: DC
Start: 2015-05-19 — End: 2015-05-25
  Administered 2015-05-21 – 2015-05-25 (×7): 3 mL via INTRAVENOUS

## 2015-05-19 MED ORDER — ALBUTEROL SULFATE (2.5 MG/3ML) 0.083% IN NEBU
2.5000 mg | INHALATION_SOLUTION | RESPIRATORY_TRACT | Status: DC
  Administered 2015-05-19 – 2015-05-23 (×22): 2.5 mg via RESPIRATORY_TRACT
  Filled 2015-05-19 (×23): qty 3

## 2015-05-19 MED ORDER — DEXTROSE 5 % IV SOLN
2.0000 g | Freq: Once | INTRAVENOUS | Status: AC
  Administered 2015-05-19: 2 g via INTRAVENOUS
  Filled 2015-05-19: qty 2

## 2015-05-19 MED ORDER — SODIUM CHLORIDE 0.9 % IV BOLUS (SEPSIS)
1000.0000 mL | INTRAVENOUS | Status: AC
  Administered 2015-05-19 (×2): 1000 mL via INTRAVENOUS

## 2015-05-19 MED ORDER — IPRATROPIUM-ALBUTEROL 0.5-2.5 (3) MG/3ML IN SOLN
3.0000 mL | Freq: Once | RESPIRATORY_TRACT | Status: AC
  Administered 2015-05-19: 3 mL via RESPIRATORY_TRACT
  Filled 2015-05-19: qty 3

## 2015-05-19 MED ORDER — PIPERACILLIN-TAZOBACTAM 3.375 G IVPB
3.3750 g | Freq: Three times a day (TID) | INTRAVENOUS | Status: DC
  Administered 2015-05-19 – 2015-05-22 (×9): 3.375 g via INTRAVENOUS
  Filled 2015-05-19 (×11): qty 50

## 2015-05-19 MED ORDER — ONDANSETRON HCL 4 MG/2ML IJ SOLN: 4.0000 mg | Freq: Four times a day (QID) | INTRAMUSCULAR | Status: DC | PRN

## 2015-05-19 NOTE — ED Provider Notes (Addendum)
[facility] Emergency Department Provider Note  ____________________________________________  Time seen: Approximately 340 PM  I have reviewed the triage vital signs and the nursing notes.   HISTORY  Chief Complaint Shortness of Breath and Altered Mental Status    HPI Hailey [name] is a 82 y.o. female with a history of pneumonia and COPD who is presenting to the emergency department today with shortness of breath and altered mental status. The patient is usually not on home oxygen but has been placed on 4 L nasal cannula O2 and is still satting at 88%. He is also reporting a burning sensation while urinating as well as body aches. She says that the symptoms have been going on for about 2-3 days at this point but she is unclear of the exact start time.   Past Medical History  Diagnosis Date  . DVT (deep vein thrombosis) in pregnancy 09/2008    a. LLE, recurrent hx, has IVC filter. Not on coumadin now with history of GI bleeding  . Hyperlipidemia   . GERD (gastroesophageal reflux disease)   . Hypertrophic cardiomyopathy ([place])     a. Echo 4/09 with EF 70-75%, asymmetricy basal septal hypertrophy, mild MR without systolic anterior motion of the mitral valve, LVOT gradient to 130 mmHg with Valsalva b. Echo 7/13: EF 60-65%, mild focal basal septal hypertrophy, no significant LVOT gradient, no MV SAM c. Echo 2/15: EF 55-60%, HOCM, resting LVOT gradient 29 mmHg, Valsalva LVOT gradient > 140 mmHg, mild LVH, mild TR, elevated PASP  . Anemia     Chronic  . Osteoarthritis     Hx of left TKR  . Fibromyalgia   . Cerebral aneurysm     Hx of  . Migraine headache     Hx of  . Hypertension   . COPD (chronic obstructive pulmonary disease) ([place])   . PUD (peptic ulcer disease)     With GI bleeding  . History of colonoscopy   . PAF (paroxysmal atrial fibrillation) ([place])     a. Full-dose ASA alone 2/2 h/o GIB.  Marland Kitchen OSA (obstructive sleep apnea)     a. Intolerant to CPAP,  wears 2L via n/c  . Fibrocystic disease of breast   . Anxiety   . PAT (paroxysmal atrial tachycardia) ([place])   . Pneumonia   . 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 Asthma   . CHF (congestive heart failure) ([place])   . Arthritis   . Tremor   . Heart murmur   . Diverticulitis   . Pulmonary embolism ([place])   . Atrophic vaginitis   . Incomplete bladder emptying   . Gross hematuria   . Obesity   . Gout     Patient Active Problem List   Diagnosis Date Noted  . Weakness 01/27/2015  . Abdomen enlarged 09/26/2013  . CTCL (cutaneous T-cell lymphoma) ([place]) 09/18/2013  . A-fib ([place]) 08/09/2013  . Anxiety 08/09/2013  . CCF (congestive cardiac failure) ([place]) 08/09/2013  . Chronic pain 08/09/2013  . PAF (paroxysmal atrial fibrillation) ([place])   . Chronic diastolic CHF (congestive heart failure) ([place]) 07/12/2013  . Benign essential HTN 07/01/2013  . Bilateral leg edema 06/06/2013  . Paroxysmal atrial fibrillation ([place]) 04/25/2013  . DOE (dyspnea on exertion) 10/11/2011  . COPD (chronic obstructive pulmonary disease) ([place]) 10/09/2011  . Cognitive decline 10/09/2011  . Behavior concern 08/23/2011  . Chest pain 10/26/2010  . Palpitations 10/26/2010  . PHLEBITIS AND THROMBOPHLEBITIS OF FEMORAL VEIN 10/13/2008  .  DEEP VENOUS THROMBOPHLEBITIS, LEG, LEFT 10/09/2008  . GASTRIC ULCER, ACUTE 10/09/2008  . PERSONAL HX COLONIC POLYPS 09/02/2008  . FATIGUE 08/27/2008  . Hypertrophic obstructive cardiomyopathy(425.11) 11/06/2007  . HYPOTENSION, ORTHOSTATIC 11/06/2007  . ADENOMATOUS COLONIC POLYP 04/12/2007  . HIATAL HERNIA 04/12/2007  . Diverticulosis of colon (without mention of hemorrhage) 04/12/2007  . Hyperlipidemia 02/03/2007  . ANEMIA, CHRONIC 02/03/2007  . MIGRAINE HEADACHE 02/03/2007  . Essential hypertension 02/03/2007  . CEREBRAL ANEURYSM 02/03/2007  . GASTROESOPHAGEAL REFLUX DISEASE 02/03/2007  . OSTEOARTHRITIS 02/03/2007  . FIBROMYALGIA  02/03/2007  . CHRONIC FATIGUE SYNDROME 02/03/2007  . MITRAL VALVE PROLAPSE, HX OF 02/03/2007  . PULMONARY EMBOLISM, HX OF 02/03/2007  . TOTAL KNEE REPLACEMENT, LEFT, HX OF 02/03/2007  . HYSTERECTOMY, HX OF 02/03/2007  . Other acquired absence of organ 02/03/2007  . INGUINAL HERNIORRHAPHY, RIGHT, HX OF 02/03/2007    Past Surgical History  Procedure Laterality Date  . Total knee arthroplasty      Left  . Bladder suspension    . Foot surgery      Bilateral  . Inguinal hernia repair  1968    Right  . Cerebral aneurysm repair  1998  . Abdominal hysterectomy    . Appendectomy    . Tonsillectomy and adenoidectomy    . Shoulder surgery  1972    Right   Left 2012  . Knee arthroscopy  2009    Right  . Cardiac catheterization  2009    No significant CAD  . Cardiovascular stress test      a. Lexiscan Myoview 3/14: EF 76%, no evidence of ischemia or WMAs  . Cataract surgery      Current Outpatient Rx  Name  Route  Sig  Dispense  Refill  . acetaminophen (TYLENOL) 500 MG tablet   Oral   Take 1,000 mg by mouth every 6 (six) hours as needed for mild pain or headache.          Marland Kitchen amiodarone (PACERONE) 200 MG tablet   Oral   Take 200 mg by mouth daily. Pt is able to take an additional tablet if needed for breakthrough arrhythmias.         . dimenhyDRINATE (DRAMAMINE) 50 MG tablet   Oral   Take 25-50 mg by mouth every 6 (six) hours as needed for dizziness.          . docusate sodium (COLACE) 100 MG capsule   Oral   Take 100 mg by mouth 2 (two) times daily as needed for mild constipation.         . DULoxetine (CYMBALTA) 30 MG capsule   Oral   Take 30 mg by mouth daily. Pt takes with a 60mg  capsule.         . DULoxetine (CYMBALTA) 60 MG capsule   Oral   Take 60 mg by mouth daily. Pt takes with a 30mg  capsule.         . feeding supplement, ENSURE ENLIVE, (ENSURE ENLIVE) LIQD   Oral   Take 237 mLs by mouth 2 (two) times daily between meals.   237 mL   12   .  fluticasone (FLONASE) 50 MCG/ACT nasal spray   Each Nare   Place 2 sprays into both nostrils daily as needed for rhinitis.         Marland Kitchen gabapentin (NEURONTIN) 100 MG capsule   Oral   Take 200-400 mg by mouth 3 (three) times daily. Pt takes three capsules in the morning, two capsules in the  evening, and four capsules at bedtime.         Marland Kitchen losartan (COZAAR) 50 MG tablet   Oral   Take 0.5 tablets (25 mg total) by mouth daily.   30 tablet   3     FOR NEXT FILL. THANK YOU   . lovastatin (MEVACOR) 40 MG tablet   Oral   Take 40 mg by mouth at bedtime.         . Melatonin 5 MG CAPS   Oral   Take 5 mg by mouth at bedtime.         . metoprolol succinate (TOPROL-XL) 25 MG 24 hr tablet   Oral   Take 25 mg by mouth at bedtime.          . montelukast (SINGULAIR) 10 MG tablet   Oral   Take 10 mg by mouth at bedtime.         . nitrofurantoin, macrocrystal-monohydrate, (MACROBID) 100 MG capsule   Oral   Take 1 capsule (100 mg total) by mouth at bedtime.   90 capsule   0   . nortriptyline (PAMELOR) 50 MG capsule   Oral   Take 50 mg by mouth at bedtime.         . pantoprazole (PROTONIX) 40 MG tablet   Oral   Take 40 mg by mouth daily.         . polyethylene glycol (MIRALAX / GLYCOLAX) packet   Oral   Take 17 g by mouth daily as needed for moderate constipation.          . potassium chloride (K-DUR) 10 MEQ tablet      TAKE TWO TABLETS TWICE DAILY   120 tablet   3     FOR NEXT FILLTHANK YOU   . potassium chloride (K-DUR,KLOR-CON) 10 MEQ tablet   Oral   Take 20 mEq by mouth 2 (two) times daily.         . pramipexole (MIRAPEX) 0.25 MG tablet   Oral   Take 1 tablet by mouth at bedtime.          . sucralfate (CARAFATE) 1 G tablet   Oral   Take 1 g by mouth 2 (two) times daily before a meal.          . tiotropium (SPIRIVA) 18 MCG inhalation capsule   Inhalation   Place 18 mcg into inhaler and inhale daily.         Marland Kitchen torsemide (DEMADEX) 20 MG  tablet   Oral   Take 20 mg by mouth 2 (two) times daily.          . traMADol (ULTRAM) 50 MG tablet   Oral   Take 50 mg by mouth every 6 (six) hours as needed for moderate pain.         Marland Kitchen triamcinolone ointment (KENALOG) 0.1 %   Topical   Apply 1 application topically 2 (two) times daily as needed (for rash).            Allergies Aspirin; Codeine; Ketorolac tromethamine; Nsaids; and Phenazopyridine hcl  Family History  Problem Relation Age of Onset  . Pancreatic cancer Mother   . Colon cancer Father   . Colon polyps Father   . Heart disease Father   . Heart disease Sister     More than 1 sister  . Colon cancer Brother   . Colon polyps Other     Siblings  . Ulcers Brother   . Hypertension Mother  father  . Diabetes Mellitus II Mother     Sister  . Stroke Father     Social History Social History  Substance Use Topics  . Smoking status: Former Smoker -- 1.00 packs/day for 60 years    Types: Cigarettes    Quit date: 12/15/2008  . Smokeless tobacco: None     Comment: smoked for 50 years quit 5 + years  . Alcohol Use: 0.0 oz/week    0 Standard drinks or equivalent per week     Comment: Socially    Review of Systems Constitutional: No fever/chills Eyes: No visual changes. ENT: No sore throat. Cardiovascular: Diffuse aching to the chest. Respiratory: As above Gastrointestinal:  no vomiting.  No diarrhea.  No constipation. Genitourinary: Negative for dysuria. Musculoskeletal: Negative for back pain. Skin: Negative for rash. Neurological: Negative for headaches, focal weakness or numbness.  10-point ROS otherwise negative.  ____________________________________________   PHYSICAL EXAM:  VITAL SIGNS: ED Triage Vitals  Enc Vitals Group     BP 05/19/15 1501 147/64 mmHg     Pulse Rate 05/19/15 1500 80     Resp 05/19/15 1503 22     Temp 05/19/15 1503 101.9 F (38.8 C)     Temp Source 05/19/15 1503 Oral     SpO2 05/19/15 1456 88 %     Weight  05/19/15 1503 186 lb 8 oz (84.596 kg)     Height 05/19/15 1503 5\' 3"  (1.6 m)     Head Cir --      Peak Flow --      Pain Score --      Pain Loc --      Pain Edu? --      Excl. in [place]? --     Constitutional: Alert and oriented. Well appearing and in no acute distress. Eyes: Conjunctivae are normal. PERRL. EOMI. Head: Atraumatic. Nose: No congestion/rhinnorhea. Mouth/Throat: Mucous membranes are moist.  Oropharynx non-erythematous. Neck: No stridor.   Cardiovascular: Normal rate, regular rhythm. Grossly normal heart sounds.  Good peripheral circulation. Respiratory: Normal respiratory effort.  No retractions.  Rales diffusely but greatest in the left side. No wheezing. Gastrointestinal: Soft and nontender. No distention. No abdominal bruits. No CVA tenderness. Musculoskeletal: No lower extremity tenderness nor edema.  No joint effusions. Neurologic:  Normal speech and language. No gross focal neurologic deficits are appreciated. No gait instability. Skin:  Skin is warm, dry and intact. No rash noted. Psychiatric: Mood and affect are normal. Speech and behavior are normal.  ____________________________________________   LABS (all labs ordered are listed, but only abnormal results are displayed)  Labs Reviewed  CULTURE, BLOOD (ROUTINE X 2)  CULTURE, BLOOD (ROUTINE X 2)  URINE CULTURE  RAPID INFLUENZA A&B ANTIGENS ([facility] ONLY)  COMPREHENSIVE METABOLIC PANEL  CBC WITH DIFFERENTIAL/PLATELET  LACTIC ACID, PLASMA  LACTIC ACID, PLASMA  TROPONIN I  URINALYSIS COMPLETEWITH MICROSCOPIC ([facility] ONLY)   ____________________________________________  EKG  ED ECG REPORT I, Doran Stabler, the attending physician, personally viewed and interpreted this ECG.   Date: 05/19/2015  EKG Time: 1513  Rate: 79  Rhythm: normal sinus rhythm  Axis: Normal  Intervals:Prolonged PR  ST&T Change: No ST segment elevation or depression. T-wave inversions in 2, 3 and aVF. As well as biphasic T  waves in V3 through V6.  ____________________________________________  RADIOLOGY  Imaging Results       DG Chest 2 View (Final result) Result time: 05/19/15 15:48:44   Final result by Rad Results In Interface (05/19/15 15:48:44)  Narrative:   CLINICAL DATA: Respiratory difficulty  EXAM: CHEST 2 VIEW  COMPARISON: 01/27/2015  FINDINGS: Consolidation has developed in the posterior apical left upper lobe. Normal heart size. Right lung is clear. No pneumothorax. No pleural effusion. Stable L1 compression fracture.  IMPRESSION: Left upper lobe pneumonia. Followup PA and lateral chest X-ray is recommended in 3-4 weeks following trial of antibiotic therapy to ensure resolution and exclude underlying malignancy.   Electronically Signed By: Marybelle Killings M.D. On: 05/19/2015 15:48       ____________________________________________   PROCEDURES    ____________________________________________   INITIAL IMPRESSION / ASSESSMENT AND PLAN / ED COURSE  Pertinent labs & imaging results that were available during my care of the patient were reviewed by me and considered in my medical decision making (see chart for details).  Sepsis or cold and sepsis protocol initiated and sepsis alert called.  ----------------------------------------- 4:39 PM on 05/19/2015 -----------------------------------------  Discussed case with the CCU attending, Dr. Haze Rushing.  He will be admitting the patient. ____________________________________________   FINAL CLINICAL IMPRESSION(S) / ED DIAGNOSES  HCAP    Orbie Pyo, MD 05/19/15 1639  Labs still pending at this time. Dr. Haze Rushing aware.   Patient updated about her diagnosis and need for [facility] the hospital. She continues to be without any respiratory distress. Satting in the upper 90s on a nonrebreather.  Orbie Pyo, MD 05/19/15 [phone]

## 2015-05-19 NOTE — H&P (Signed)
[facility] Consultation      Name: Hailey [name] MRN: [id number] DOB: [birth year]    ADMISSION DATE:  05/19/2015    CHIEF COMPLAINT:   Acute resp failure   HISTORY OF PRESENT ILLNESS  82 yo white female seen this afternoon in ER for acute resp distress, patient has been feeling bad for several days She has a history of pneumonia and COPD who is presenting to the emergency department today with shortness of breath and altered mental status.  -The patient is usually not on home oxygen but has been placed on 100%NRB mask, still satting at 88%. He is also reporting a burning sensation while urinating as well as body aches.  -She says that the symptoms have been going on for about 2-3 days at this point but she is unclear of the exact start time.  In ER patient is SOB, slight muscle accessory use, Temp 101.9 BP 140/60, patient in distress having hard time to breathe, complains of productive cough yellow sputum Former tobacco abuse, qiut smoking 5 years ago.  pateint    PAST MEDICAL HISTORY    :  Past Medical History  Diagnosis Date  . DVT (deep vein thrombosis) in pregnancy 09/2008    a. LLE, recurrent hx, has IVC filter. Not on coumadin now with history of GI bleeding  . Hyperlipidemia   . GERD (gastroesophageal reflux disease)   . Hypertrophic cardiomyopathy ([place])     a. Echo 4/09 with EF 70-75%, asymmetricy basal septal hypertrophy, mild MR without systolic anterior motion of the mitral valve, LVOT gradient to 130 mmHg with Valsalva b. Echo 7/13: EF 60-65%, mild focal basal septal hypertrophy, no significant LVOT gradient, no MV SAM c. Echo 2/15: EF 55-60%, HOCM, resting LVOT gradient 29 mmHg, Valsalva LVOT gradient > 140 mmHg, mild LVH, mild TR, elevated PASP  . Anemia     Chronic  . Osteoarthritis     Hx of left TKR  . Fibromyalgia   . Cerebral aneurysm     Hx of  . Migraine headache     Hx of  . Hypertension   . COPD (chronic obstructive pulmonary  disease) ([place])   . PUD (peptic ulcer disease)     With GI bleeding  . History of colonoscopy   . PAF (paroxysmal atrial fibrillation) ([place])     a. Full-dose ASA alone 2/2 h/o GIB.  Marland Kitchen OSA (obstructive sleep apnea)     a. Intolerant to CPAP, wears 2L via n/c  . Fibrocystic disease of breast   . Anxiety   . PAT (paroxysmal atrial tachycardia) ([place])   . Pneumonia   . 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 Asthma   . CHF (congestive heart failure) ([place])   . Arthritis   . Tremor   . Heart murmur   . Diverticulitis   . Pulmonary embolism ([place])   . Atrophic vaginitis   . Incomplete bladder emptying   . Gross hematuria   . Obesity   . Gout    Past Surgical History  Procedure Laterality Date  . Total knee arthroplasty      Left  . Bladder suspension    . Foot surgery      Bilateral  . Inguinal hernia repair  1968    Right  . Cerebral aneurysm repair  1998  . Abdominal hysterectomy    . Appendectomy    . Tonsillectomy and adenoidectomy    . Shoulder surgery  1972    Right   Left 2012  . Knee arthroscopy  2009    Right  . Cardiac catheterization  2009    No significant CAD  . Cardiovascular stress test      a. Lexiscan Myoview 3/14: EF 76%, no evidence of ischemia or WMAs  . Cataract surgery     Prior to Admission medications   Medication Sig Start Date End Date Taking? Authorizing Provider  acetaminophen (TYLENOL) 500 MG tablet Take 1,000 mg by mouth every 6 (six) hours as needed for mild pain or headache.     Historical Provider, MD  amiodarone (PACERONE) 200 MG tablet Take 200 mg by mouth daily. Pt is able to take an additional tablet if needed for breakthrough arrhythmias.    Historical Provider, MD  dimenhyDRINATE (DRAMAMINE) 50 MG tablet Take 25-50 mg by mouth every 6 (six) hours as needed for dizziness.     Historical Provider, MD  docusate sodium (COLACE) 100 MG capsule Take 100 mg by mouth 2 (two) times daily as needed for mild  constipation.    Historical Provider, MD  DULoxetine (CYMBALTA) 30 MG capsule Take 30 mg by mouth daily. Pt takes with a 60mg  capsule.    Historical Provider, MD  DULoxetine (CYMBALTA) 60 MG capsule Take 60 mg by mouth daily. Pt takes with a 30mg  capsule.    Historical Provider, MD  feeding supplement, ENSURE ENLIVE, (ENSURE ENLIVE) LIQD Take 237 mLs by mouth 2 (two) times daily between meals. 01/28/15   Bettey Costa, MD  fluticasone (FLONASE) 50 MCG/ACT nasal spray Place 2 sprays into both nostrils daily as needed for rhinitis.    Historical Provider, MD  gabapentin (NEURONTIN) 100 MG capsule Take 200-400 mg by mouth 3 (three) times daily. Pt takes three capsules in the morning, two capsules in the evening, and four capsules at bedtime.    Historical Provider, MD  losartan (COZAAR) 50 MG tablet Take 0.5 tablets (25 mg total) by mouth daily. 11/05/14   Minna Merritts, MD  lovastatin (MEVACOR) 40 MG tablet Take 40 mg by mouth at bedtime.    Historical Provider, MD  Melatonin 5 MG CAPS Take 5 mg by mouth at bedtime.    Historical Provider, MD  metoprolol succinate (TOPROL-XL) 25 MG 24 hr tablet Take 25 mg by mouth at bedtime.     Historical Provider, MD  montelukast (SINGULAIR) 10 MG tablet Take 10 mg by mouth at bedtime.    Historical Provider, MD  nitrofurantoin, macrocrystal-monohydrate, (MACROBID) 100 MG capsule Take 1 capsule (100 mg total) by mouth at bedtime. 10/09/14   Nori Riis, PA-C  nortriptyline (PAMELOR) 50 MG capsule Take 50 mg by mouth at bedtime.    Historical Provider, MD  pantoprazole (PROTONIX) 40 MG tablet Take 40 mg by mouth daily.    Historical Provider, MD  polyethylene glycol (MIRALAX / GLYCOLAX) packet Take 17 g by mouth daily as needed for moderate constipation.     Historical Provider, MD  potassium chloride (K-DUR) 10 MEQ tablet TAKE TWO TABLETS TWICE DAILY 04/15/15   Minna Merritts, MD  potassium chloride (K-DUR,KLOR-CON) 10 MEQ tablet Take 20 mEq by mouth 2 (two)  times daily.    Historical Provider, MD  pramipexole (MIRAPEX) 0.25 MG tablet Take 1 tablet by mouth at bedtime.     Historical Provider, MD  sucralfate (CARAFATE) 1 G tablet Take 1 g by mouth 2 (two) times daily before a meal.     Historical Provider, MD  tiotropium (SPIRIVA) 18 MCG inhalation capsule Place 18 mcg into inhaler and inhale daily.    Historical Provider, MD  torsemide (DEMADEX) 20 MG tablet Take 20 mg by mouth 2 (two) times daily.     Historical Provider, MD  traMADol (ULTRAM) 50 MG tablet Take 50 mg by mouth every 6 (six) hours as needed for moderate pain.    Historical Provider, MD  triamcinolone ointment (KENALOG) 0.1 % Apply 1 application topically 2 (two) times daily as needed (for rash).     Historical Provider, MD   Allergies  Allergen Reactions  . Aspirin Other (See Comments)    Reaction:  GI bleeding   . Codeine Nausea And Vomiting  . Ketorolac Tromethamine Other (See Comments)    Reaction:  Headache   . Nsaids Other (See Comments)    Reaction:  GI bleeding   . Phenazopyridine Hcl Other (See Comments)    Reaction:  Vision problems      FAMILY HISTORY   Family History  Problem Relation Age of Onset  . Pancreatic cancer Mother   . Colon cancer Father   . Colon polyps Father   . Heart disease Father   . Heart disease Sister     More than 1 sister  . Colon cancer Brother   . Colon polyps Other     Siblings  . Ulcers Brother   . Hypertension Mother     father  . Diabetes Mellitus II Mother     Sister  . Stroke Father       SOCIAL HISTORY    reports that she quit smoking about 6 years ago. Her smoking use included Cigarettes. She has a 60 pack-year smoking history. She does not have any smokeless tobacco history on file. She reports that she drinks alcohol. She reports that she does not use illicit drugs.  Review of Systems  Constitutional: Positive for fever, chills, weight loss and malaise/fatigue.  HENT: Positive for congestion.   Eyes:  Negative for blurred vision and double vision.  Respiratory: Positive for cough, sputum production, shortness of breath and wheezing.   Cardiovascular: Positive for leg swelling. Negative for chest pain, palpitations and orthopnea.  Gastrointestinal: Positive for nausea. Negative for heartburn, vomiting and abdominal pain.  Genitourinary: Positive for dysuria.  Musculoskeletal: Positive for myalgias and joint pain.  Skin: Negative for rash.  Neurological: Negative for dizziness, tingling, tremors and headaches.  Psychiatric/Behavioral: The patient is nervous/anxious.       VITAL SIGNS    Temp:  [101.9 F (38.8 C)] 101.9 F (38.8 C) (04/04 1503) Pulse Rate:  [79-80] 79 (04/04 1515) Resp:  [22-27] 27 (04/04 1515) BP: (147)/(64) 147/64 mmHg (04/04 1503) SpO2:  [84 %-93 %] 93 % (04/04 1515) Weight:  [186 lb 8 oz (84.596 kg)] 186 lb 8 oz (84.596 kg) (04/04 1503) HEMODYNAMICS:   VENTILATOR SETTINGS:   INTAKE / OUTPUT: No intake or output data in the 24 hours ending 05/19/15 1702     PHYSICAL EXAM   Physical Exam  Constitutional: She is oriented to person, place, and time. She appears distressed.  HENT:  Head: Normocephalic and atraumatic.  Eyes: Pupils are equal, round, and reactive to light. No scleral icterus.  Neck: Normal range of motion. Neck supple.  Cardiovascular: Normal rate and regular rhythm.   No murmur heard. Pulmonary/Chest: She is in respiratory distress. She has wheezes. She has rales.  resp distress  Abdominal: Soft. She exhibits no distension. There is no tenderness.  Musculoskeletal: Normal range  of motion. She exhibits edema.  Swan neck deformity of fingers  Neurological: She is alert and oriented to person, place, and time. She displays normal reflexes. Coordination normal.  Skin: Skin is warm. No rash noted. She is diaphoretic.       LABS   LABS:  CBC  Recent Labs Lab 05/19/15 1611  WBC 15.6*  HGB 11.1*  HCT 32.8*  PLT 188    Coag's No results for input(s): APTT, INR in the last 168 hours. BMET No results for input(s): NA, K, CL, CO2, BUN, CREATININE, GLUCOSE in the last 168 hours. Electrolytes No results for input(s): CALCIUM, MG, PHOS in the last 168 hours. Sepsis Markers No results for input(s): LATICACIDVEN, PROCALCITON, O2SATVEN in the last 168 hours. ABG No results for input(s): PHART, PCO2ART, PO2ART in the last 168 hours. Liver Enzymes No results for input(s): AST, ALT, ALKPHOS, BILITOT, ALBUMIN in the last 168 hours. Cardiac Enzymes No results for input(s): TROPONINI, PROBNP in the last 168 hours. Glucose No results for input(s): GLUCAP in the last 168 hours.   No results found for this or any previous visit (from the past 240 hour(s)).   Current facility-administered medications:  .  0.9 %  sodium chloride infusion, 250 mL, Intravenous, PRN, Flora Lipps, MD .  0.9 %  sodium chloride infusion, 250 mL, Intravenous, PRN, Flora Lipps, MD .  acetaminophen (TYLENOL) tablet 650 mg, 650 mg, Oral, Q4H PRN, Flora Lipps, MD .  ceFEPIme (MAXIPIME) 2 g in dextrose 5 % 50 mL IVPB, 2 g, Intravenous, Once, Orbie Pyo, MD, Last Rate: 100 mL/hr at 05/19/15 1659, 2 g at 05/19/15 1659 .  enoxaparin (LOVENOX) injection 40 mg, 40 mg, Subcutaneous, Q24H, Flora Lipps, MD .  famotidine (PEPCID) IVPB 20 mg premix, 20 mg, Intravenous, Q12H, Flora Lipps, MD .  ondansetron (ZOFRAN) injection 4 mg, 4 mg, Intravenous, Q6H PRN, Flora Lipps, MD .  sodium chloride 0.9 % bolus 1,000 mL, 1,000 mL, Intravenous, Q1H, Orbie Pyo, MD, Last Rate: 1,000 mL/hr at 05/19/15 1626, 1,000 mL at 05/19/15 1626 .  sodium chloride flush (NS) 0.9 % injection 3 mL, 3 mL, Intravenous, Q12H, Flora Lipps, MD .  sodium chloride flush (NS) 0.9 % injection 3 mL, 3 mL, Intravenous, PRN, Flora Lipps, MD .  vancomycin (VANCOCIN) IVPB 1000 mg/200 mL premix, 1,000 mg, Intravenous, Once, Orbie Pyo, MD  Current  outpatient prescriptions:  .  acetaminophen (TYLENOL) 500 MG tablet, Take 1,000 mg by mouth every 6 (six) hours as needed for mild pain or headache. , Disp: , Rfl:  .  amiodarone (PACERONE) 200 MG tablet, Take 200 mg by mouth daily. Pt is able to take an additional tablet if needed for breakthrough arrhythmias., Disp: , Rfl:  .  dimenhyDRINATE (DRAMAMINE) 50 MG tablet, Take 25-50 mg by mouth every 6 (six) hours as needed for dizziness. , Disp: , Rfl:  .  docusate sodium (COLACE) 100 MG capsule, Take 100 mg by mouth 2 (two) times daily as needed for mild constipation., Disp: , Rfl:  .  DULoxetine (CYMBALTA) 30 MG capsule, Take 30 mg by mouth daily. Pt takes with a 60mg  capsule., Disp: , Rfl:  .  DULoxetine (CYMBALTA) 60 MG capsule, Take 60 mg by mouth daily. Pt takes with a 30mg  capsule., Disp: , Rfl:  .  feeding supplement, ENSURE ENLIVE, (ENSURE ENLIVE) LIQD, Take 237 mLs by mouth 2 (two) times daily between meals., Disp: 237 mL, Rfl: 12 .  fluticasone (FLONASE) 50 MCG/ACT  nasal spray, Place 2 sprays into both nostrils daily as needed for rhinitis., Disp: , Rfl:  .  gabapentin (NEURONTIN) 100 MG capsule, Take 200-400 mg by mouth 3 (three) times daily. Pt takes three capsules in the morning, two capsules in the evening, and four capsules at bedtime., Disp: , Rfl:  .  losartan (COZAAR) 50 MG tablet, Take 0.5 tablets (25 mg total) by mouth daily., Disp: 30 tablet, Rfl: 3 .  lovastatin (MEVACOR) 40 MG tablet, Take 40 mg by mouth at bedtime., Disp: , Rfl:  .  Melatonin 5 MG CAPS, Take 5 mg by mouth at bedtime., Disp: , Rfl:  .  metoprolol succinate (TOPROL-XL) 25 MG 24 hr tablet, Take 25 mg by mouth at bedtime. , Disp: , Rfl:  .  montelukast (SINGULAIR) 10 MG tablet, Take 10 mg by mouth at bedtime., Disp: , Rfl:  .  nitrofurantoin, macrocrystal-monohydrate, (MACROBID) 100 MG capsule, Take 1 capsule (100 mg total) by mouth at bedtime., Disp: 90 capsule, Rfl: 0 .  nortriptyline (PAMELOR) 50 MG capsule,  Take 50 mg by mouth at bedtime., Disp: , Rfl:  .  pantoprazole (PROTONIX) 40 MG tablet, Take 40 mg by mouth daily., Disp: , Rfl:  .  polyethylene glycol (MIRALAX / GLYCOLAX) packet, Take 17 g by mouth daily as needed for moderate constipation. , Disp: , Rfl:  .  potassium chloride (K-DUR) 10 MEQ tablet, TAKE TWO TABLETS TWICE DAILY, Disp: 120 tablet, Rfl: 3 .  potassium chloride (K-DUR,KLOR-CON) 10 MEQ tablet, Take 20 mEq by mouth 2 (two) times daily., Disp: , Rfl:  .  pramipexole (MIRAPEX) 0.25 MG tablet, Take 1 tablet by mouth at bedtime. , Disp: , Rfl:  .  sucralfate (CARAFATE) 1 G tablet, Take 1 g by mouth 2 (two) times daily before a meal. , Disp: , Rfl:  .  tiotropium (SPIRIVA) 18 MCG inhalation capsule, Place 18 mcg into inhaler and inhale daily., Disp: , Rfl:  .  torsemide (DEMADEX) 20 MG tablet, Take 20 mg by mouth 2 (two) times daily. , Disp: , Rfl:  .  traMADol (ULTRAM) 50 MG tablet, Take 50 mg by mouth every 6 (six) hours as needed for moderate pain., Disp: , Rfl:  .  triamcinolone ointment (KENALOG) 0.1 %, Apply 1 application topically 2 (two) times daily as needed (for rash). , Disp: , Rfl:   IMAGING    Dg Chest 2 View  05/19/2015  CLINICAL DATA:  Respiratory difficulty EXAM: CHEST  2 VIEW COMPARISON:  01/27/2015 FINDINGS: Consolidation has developed in the posterior apical left upper lobe. Normal heart size. Right lung is clear. No pneumothorax. No pleural effusion. Stable L1 compression fracture. IMPRESSION: Left upper lobe pneumonia. Followup PA and lateral chest X-ray is recommended in 3-4 weeks following trial of antibiotic therapy to ensure resolution and exclude underlying malignancy. Electronically Signed   By: Marybelle Killings M.D.   On: 05/19/2015 15:48     INDWELLING DEVICES::  MICRO DATA: MRSA PCR  Urine  Blood Resp   ANTIMICROBIALS: Vanc/zosyn>>>4/4    ASSESSMENT/PLAN   82 yo white female admitted to ICU for acute hypoxic resp failure from acute Left Lung  pneumonia with COPD exacerbation with increased WOB and SOB  PULMONARY -Respiratory Failure-will try high flow , BiPAP prior to intubation if needed -oxygen support -IV steroids, IV abx and aggressive BD therapy   CARDIOVASCULAR BP stable -IVF's if needed  RENAL Follow UP -check UA with culture  GASTROINTESTINAL NPO for now  HEMATOLOGIC Follow CBC  INFECTIOUS Pneumonia -vanc and zosyn, blood cultures and sputum cultures      I have personally obtained a history, examined the patient, evaluated laboratory and independently reviewed  imaging results, formulated the assessment and plan and placed orders.  The Patient requires high complexity decision making for assessment and support, frequent evaluation and titration of therapies, application of advanced monitoring technologies and extensive interpretation of multiple databases. Critical Care Time devoted to patient care services described in this note is 45 minutes.   Overall, patient is critically ill, prognosis is guarded. Patient at high risk for cardiac arrest and death.    Corrin Parker, M.D.  [facility]  Medical Director [place] Director [facility] Cardio-Pulmonary Department

## 2015-05-19 NOTE — ED Notes (Signed)
Pt arrived to ED via EMS from [facility] independent assisted living c/o SOB & altered mental status. Per EMS staff reported pt being more confused and having difficulty breathing sating at 80%; EMS placed  pt on 4L nasal canula and sats increased to 88%. Per EMS pt reported a burning sensation while urinating, and has a history of COPD, asthma, depression, and anxiety.

## 2015-05-19 NOTE — ED Notes (Signed)
Pt to chest xray.

## 2015-05-19 NOTE — Progress Notes (Signed)
eLink Physician-Brief Progress Note Patient Name: KENDYL GORR DOB: Jun 24, 1932 MRN: [id number]   Date of Service  05/19/2015  HPI/Events of Note  wob ok Labs reviewed  eICU Interventions  Hypokalemia -repleted      Intervention Category Intermediate Interventions: Electrolyte abnormality - evaluation and management Evaluation Type: New Patient Evaluation  Terrika Zuver V. 05/19/2015, 7:13 PM

## 2015-05-19 NOTE — ED Notes (Signed)
Pt sating at 85% on 4Ls nasal canula, pt placed on non rebreather, MD made aware.

## 2015-05-19 NOTE — Consult Note (Signed)
Pharmacy Antibiotic Note  Hailey [name] is a 82 y.o. female admitted on 05/19/2015 with pneumonia.  Pharmacy has been consulted for vancomycin and zosyn dosing.  Plan: Pt received 1g of vancomycin in the ED. Will start 750mg  q 12 hours 6 hours after initial dose for stacked dosing. Vancomycin 750 IV every 12 hours.  Goal trough 15-20 mcg/mL. Zosyn 3.375g IV q8h (4 hour infusion).  Will draw trough at stready state 0406 @ 1130  Height: 5\' 3"  (160 cm) Weight: 186 lb 8 oz (84.596 kg) IBW/kg (Calculated) : 52.4  Temp (24hrs), Avg:101.4 F (38.6 C), Min:100.8 F (38.2 C), Max:101.9 F (38.8 C)   Recent Labs Lab 05/19/15 1611  WBC 15.6*  CREATININE 0.94  LATICACIDVEN 2.8*    Estimated Creatinine Clearance: 49.2 mL/min (by C-G formula based on Cr of 0.94).    Allergies  Allergen Reactions  . Aspirin Other (See Comments)    Reaction:  GI bleeding   . Codeine Nausea And Vomiting  . Ketorolac Tromethamine Other (See Comments)    Reaction:  Headache   . Nsaids Other (See Comments)    Reaction:  GI bleeding   . Phenazopyridine Hcl Other (See Comments)    Reaction:  Vision problems     Antimicrobials this admission: vancomycin 4/4 >>  cefepime 4/4 >> 4/4 one dose Zosyn 4/4>>  Dose adjustments this admission:   Microbiology results: 4/4 BCx: in process 4/4 UCx: in process   4/4 MRSA PCR: in process  Thank you for allowing pharmacy to be a part of this patient's care.  Ramond Dial 05/19/2015 7:45 PM

## 2015-05-20 DIAGNOSIS — J189 Pneumonia, unspecified organism: Secondary | ICD-10-CM

## 2015-05-20 LAB — CBC
HCT: 29.9 % — ABNORMAL LOW (ref 35.0–47.0)
Hemoglobin: 10.2 g/dL — ABNORMAL LOW (ref 12.0–16.0)
MCH: 32.7 pg (ref 26.0–34.0)
MCHC: 34.2 g/dL (ref 32.0–36.0)
MCV: 95.8 fL (ref 80.0–100.0)
Platelets: 167 10*3/uL (ref 150–440)
RBC: 3.12 MIL/uL — ABNORMAL LOW (ref 3.80–5.20)
RDW: 14.8 % — AB (ref 11.5–14.5)
WBC: 11 10*3/uL (ref 3.6–11.0)

## 2015-05-20 LAB — LACTIC ACID, PLASMA: LACTIC ACID, VENOUS: 1.8 mmol/L (ref 0.5–2.0)

## 2015-05-20 LAB — BLOOD GAS, ARTERIAL
ACID-BASE EXCESS: 4.8 mmol/L — AB (ref 0.0–3.0)
Allens test (pass/fail): POSITIVE — AB
BICARBONATE: 27.9 meq/L (ref 21.0–28.0)
FIO2: 0.8
O2 Saturation: 95.6 %
PATIENT TEMPERATURE: 37
pCO2 arterial: 35 mmHg (ref 32.0–48.0)
pH, Arterial: 7.51 — ABNORMAL HIGH (ref 7.350–7.450)
pO2, Arterial: 71 mmHg — ABNORMAL LOW (ref 83.0–108.0)

## 2015-05-20 NOTE — Care Management (Addendum)
Patient is from [facility] independent living. Per last visit patient uses cane or walker for ambulation. She is on chronic O2 through [facility] and has used [place] for home health services 09/2014. RNCM will continue to follow.

## 2015-05-20 NOTE — Care Management (Signed)
Dr Mortimer Fries will address code status.   Currently on HFNC.  Patient is able to make her own decisions and has decided she wants to be DNR.

## 2015-05-20 NOTE — Progress Notes (Signed)
1700. Better day. More alert. Respirations less labored.

## 2015-05-20 NOTE — Care Management (Signed)
Patient resides in [facility].  She has been followed by [facility] in the past.  She is admitted with respiratory failure and currently is requiring HFNC.

## 2015-05-20 NOTE — Progress Notes (Signed)
Patient ID: Hailey [name], female   DOB: Jul 22, 1932, 82 y.o.   MRN: [id number] Foley needed 1 liter in bladder

## 2015-05-20 NOTE — Progress Notes (Signed)
Pt awoke C/O lower abdominal pain.  Had not voided.  Scanned bladder which revealed greater than 999 ml.  Informed e-Link MD.  New order received.

## 2015-05-20 NOTE — Consult Note (Signed)
Pharmacy Antibiotic Note  Hailey [name] is a 82 y.o. female admitted on 05/19/2015 with pneumonia.  Pharmacy has been consulted for vancomycin and zosyn dosing.  Plan: After discussion with Dr. Mortimer Fries, MRSA PCR is negative so will d/c vancomycin.   Will continue Zosyn 3.375 g EI q 8 hours.   Height: 5\' 3"  (160 cm) Weight: 186 lb 8 oz (84.596 kg) IBW/kg (Calculated) : 52.4  Temp (24hrs), Avg:100.3 F (37.9 C), Min:98 F (36.7 C), Max:101.9 F (38.8 C)   Recent Labs Lab 05/19/15 1611 05/19/15 2019 05/19/15 2316 05/20/15 0343  WBC 15.6*  --   --  11.0  CREATININE 0.94  --   --   --   LATICACIDVEN 2.8* 2.9* 1.9 1.8    Estimated Creatinine Clearance: 49.2 mL/min (by C-G formula based on Cr of 0.94).    Allergies  Allergen Reactions  . Aspirin Other (See Comments)    Reaction:  GI bleeding   . Codeine Nausea And Vomiting  . Ketorolac Tromethamine Other (See Comments)    Reaction:  Headache   . Nsaids Other (See Comments)    Reaction:  GI bleeding   . Phenazopyridine Hcl Other (See Comments)    Reaction:  Vision problems     Antimicrobials this admission: vancomycin 4/4 >>  4/5 cefepime 4/4 >> 4/4 one dose Zosyn 4/4>>  Dose adjustments this admission:   Microbiology results: 4/4 BCx: NGTD x 2 4/4 UCx: NGTD  4/4 MRSA PCR: negative  Thank you for allowing pharmacy to be a part of this patient's care.  Ulice Dash D 05/20/2015 1:21 PM

## 2015-05-20 NOTE — H&P (Signed)
[facility] Consultation      Name: Hailey [name] MRN: [id number] DOB: [birth year]    ADMISSION DATE:  05/19/2015    CHIEF COMPLAINT:   Acute resp failure   HISTORY OF PRESENT ILLNESS  Patient lethragic, but arousable, still with some resp distress On high flow [place] fio2 75%, remains critically ill    pateint Review of Systems  Constitutional: Positive for fever and malaise/fatigue. Negative for chills and weight loss.  HENT: Positive for congestion.   Eyes: Negative for blurred vision and double vision.  Respiratory: Positive for cough, sputum production, shortness of breath and wheezing.   Cardiovascular: Positive for leg swelling. Negative for chest pain, palpitations and orthopnea.  Gastrointestinal: Negative for heartburn, nausea, vomiting and abdominal pain.  Musculoskeletal: Positive for myalgias and joint pain.  Skin: Negative for rash.  Neurological: Negative for dizziness, tingling, tremors and headaches.  Psychiatric/Behavioral: The patient is nervous/anxious.       VITAL SIGNS    Temp:  [100.3 F (37.9 C)-101.9 F (38.8 C)] 100.3 F (37.9 C) (04/04 2030) Pulse Rate:  [71-81] 76 (04/05 0600) Resp:  [16-30] 25 (04/05 0600) BP: (118-154)/(40-86) 142/64 mmHg (04/05 0600) SpO2:  [83 %-97 %] 95 % (04/05 0841) FiO2 (%):  [74 %-80 %] 74 % (04/05 0841) Weight:  [186 lb 8 oz (84.596 kg)] 186 lb 8 oz (84.596 kg) (04/04 1503) HEMODYNAMICS:   VENTILATOR SETTINGS: Vent Mode:  [-]  FiO2 (%):  [74 %-80 %] 74 % INTAKE / OUTPUT:  Intake/Output Summary (Last 24 hours) at 05/20/15 0913 Last data filed at 05/20/15 0615  Gross per 24 hour  Intake     50 ml  Output   3400 ml  Net  -3350 ml       PHYSICAL EXAM   Physical Exam  Constitutional: She is oriented to person, place, and time. She appears distressed.  HENT:  Head: Normocephalic and atraumatic.  Eyes: Pupils are equal, round, and reactive to light. No scleral icterus.  Neck: Normal  range of motion. Neck supple.  Cardiovascular: Normal rate and regular rhythm.   No murmur heard. Pulmonary/Chest: She is in respiratory distress. She has wheezes. She has rales.  resp distress  Abdominal: Soft. She exhibits no distension. There is no tenderness.  Musculoskeletal: Normal range of motion. She exhibits edema.  Swan neck deformity of fingers  Neurological: She is alert and oriented to person, place, and time. She displays normal reflexes. Coordination normal.  Skin: Skin is warm. No rash noted. She is diaphoretic.       LABS   LABS:  CBC  Recent Labs Lab 05/19/15 1611 05/20/15 0343  WBC 15.6* 11.0  HGB 11.1* 10.2*  HCT 32.8* 29.9*  PLT 188 167   Coag's No results for input(s): APTT, INR in the last 168 hours. BMET  Recent Labs Lab 05/19/15 1611  NA 138  K 3.0*  CL 103  CO2 25  BUN 16  CREATININE 0.94  GLUCOSE 164*   Electrolytes  Recent Labs Lab 05/19/15 1611  CALCIUM 9.3   Sepsis Markers  Recent Labs Lab 05/19/15 2019 05/19/15 2316 05/20/15 0343  LATICACIDVEN 2.9* 1.9 1.8   ABG  Recent Labs Lab 05/19/15 1717 05/20/15 0453  PHART 7.50* 7.51*  PCO2ART 34 35  PO2ART 74* 71*   Liver Enzymes  Recent Labs Lab 05/19/15 1611  AST 44*  ALT 28  ALKPHOS 72  BILITOT 0.8  ALBUMIN 4.4   Cardiac Enzymes  Recent Labs Lab  05/19/15 1611  TROPONINI 0.04*   Glucose No results for input(s): GLUCAP in the last 168 hours.   Recent Results (from the past 240 hour(s))  Rapid Influenza A&B Antigens ([facility] only)     Status: None   Collection Time: 05/19/15  4:11 PM  Result Value Ref Range Status   Influenza A ([facility]) NEGATIVE NEGATIVE Final   Influenza B ([facility]) NEGATIVE NEGATIVE Final  MRSA PCR Screening     Status: None   Collection Time: 05/19/15  6:34 PM  Result Value Ref Range Status   MRSA by PCR NEGATIVE NEGATIVE Final    Comment:        The GeneXpert MRSA Assay (FDA approved for NASAL specimens only), is one component  of a comprehensive MRSA colonization surveillance program. It is not intended to diagnose MRSA infection nor to guide or monitor treatment for MRSA infections.      Current facility-administered medications:  .  0.9 %  sodium chloride infusion, 250 mL, Intravenous, PRN, Flora Lipps, MD .  0.9 %  sodium chloride infusion, 250 mL, Intravenous, PRN, Flora Lipps, MD .  acetaminophen (TYLENOL) tablet 650 mg, 650 mg, Oral, Q4H PRN, Flora Lipps, MD, 650 mg at 05/20/15 0521 .  albuterol (PROVENTIL) (2.5 MG/3ML) 0.083% nebulizer solution 2.5 mg, 2.5 mg, Nebulization, Q4H, Flora Lipps, MD, 2.5 mg at 05/20/15 0838 .  budesonide (PULMICORT) nebulizer solution 0.5 mg, 0.5 mg, Nebulization, BID, Flora Lipps, MD, 0.5 mg at 05/20/15 0838 .  enoxaparin (LOVENOX) injection 40 mg, 40 mg, Subcutaneous, Q24H, Flora Lipps, MD .  famotidine (PEPCID) IVPB 20 mg premix, 20 mg, Intravenous, Q12H, Flora Lipps, MD, 20 mg at 05/19/15 2146 .  methylPREDNISolone sodium succinate (SOLU-MEDROL) 40 mg/mL injection 40 mg, 40 mg, Intravenous, Q12H, Flora Lipps, MD, 40 mg at 05/20/15 0521 .  mometasone-formoterol (DULERA) 100-5 MCG/ACT inhaler 2 puff, 2 puff, Inhalation, BID, Flora Lipps, MD, 2 puff at 05/19/15 2026 .  ondansetron (ZOFRAN) injection 4 mg, 4 mg, Intravenous, Q6H PRN, Flora Lipps, MD .  piperacillin-tazobactam (ZOSYN) IVPB 3.375 g, 3.375 g, Intravenous, 3 times per day, Flora Lipps, MD, 3.375 g at 05/20/15 0521 .  sodium chloride flush (NS) 0.9 % injection 3 mL, 3 mL, Intravenous, Q12H, Flora Lipps, MD, 3 mL at 05/19/15 2214 .  sodium chloride flush (NS) 0.9 % injection 3 mL, 3 mL, Intravenous, PRN, Flora Lipps, MD .  tiotropium (SPIRIVA) inhalation capsule 18 mcg, 18 mcg, Inhalation, Daily, Flora Lipps, MD .  vancomycin (VANCOCIN) IVPB 750 mg/150 ml premix, 750 mg, Intravenous, Q12H, Flora Lipps, MD, 750 mg at 05/19/15 2354  IMAGING    Dg Chest 2 View  05/19/2015  CLINICAL DATA:  Respiratory difficulty  EXAM: CHEST  2 VIEW COMPARISON:  01/27/2015 FINDINGS: Consolidation has developed in the posterior apical left upper lobe. Normal heart size. Right lung is clear. No pneumothorax. No pleural effusion. Stable L1 compression fracture. IMPRESSION: Left upper lobe pneumonia. Followup PA and lateral chest X-ray is recommended in 3-4 weeks following trial of antibiotic therapy to ensure resolution and exclude underlying malignancy. Electronically Signed   By: Marybelle Killings M.D.   On: 05/19/2015 15:48     INDWELLING DEVICES::  MICRO DATA: MRSA PCR NEGATIVE Urine  Blood Resp   ANTIMICROBIALS: Vanc/zosyn>>>4/4    ASSESSMENT/PLAN   82 yo white female admitted to ICU for acute hypoxic resp failure from acute Left Lung pneumonia with COPD exacerbation with increased WOB and SOB  PULMONARY -Respiratory Failure-will try high flow [place], BiPAP  prior to intubation if needed -oxygen support -continue IV steroids, IV abx and aggressive BD therapy -remains critical, high risk for intubation   CARDIOVASCULAR BP stable -IVF's if needed  RENAL Follow UO  GASTROINTESTINAL diet as tolerated  HEMATOLOGIC Follow CBC  INFECTIOUS Pneumonia -vanc and zosyn, blood cultures and sputum cultures -will consider stopping vanc since MRSA screen neg      I have personally obtained a history, examined the patient, evaluated laboratory and independently reviewed  imaging results, formulated the assessment and plan and placed orders.  The Patient requires high complexity decision making for assessment and support, frequent evaluation and titration of therapies, application of advanced monitoring technologies and extensive interpretation of multiple databases. Critical Care Time devoted to patient care services described in this note is 30 minutes.   Overall, patient is critically ill, prognosis is guarded. Patient at high risk for cardiac arrest and death.    Corrin Parker, M.D.  [facility]  Medical Director [place] Director [facility] Cardio-Pulmonary Department

## 2015-05-21 LAB — URINE CULTURE: CULTURE: NO GROWTH

## 2015-05-21 MED ORDER — OXYCODONE HCL 5 MG PO TABS
5.0000 mg | ORAL_TABLET | Freq: Four times a day (QID) | ORAL | Status: DC | PRN
  Administered 2015-05-21 – 2015-05-25 (×9): 5 mg via ORAL
  Filled 2015-05-21 (×9): qty 1

## 2015-05-21 MED ORDER — GABAPENTIN 400 MG PO CAPS
400.0000 mg | ORAL_CAPSULE | Freq: Every day | ORAL | Status: DC
  Administered 2015-05-21 – 2015-05-24 (×4): 400 mg via ORAL
  Filled 2015-05-21 (×4): qty 1

## 2015-05-21 MED ORDER — GABAPENTIN 100 MG PO CAPS
200.0000 mg | ORAL_CAPSULE | Freq: Every evening | ORAL | Status: DC
  Administered 2015-05-21 – 2015-05-24 (×4): 200 mg via ORAL
  Filled 2015-05-21 (×6): qty 2

## 2015-05-21 MED ORDER — DULOXETINE HCL 60 MG PO CPEP
90.0000 mg | ORAL_CAPSULE | Freq: Every day | ORAL | Status: DC
  Administered 2015-05-21: 90 mg via ORAL
  Filled 2015-05-21: qty 3
  Filled 2015-05-21: qty 1

## 2015-05-21 MED ORDER — PRAVASTATIN SODIUM 20 MG PO TABS
40.0000 mg | ORAL_TABLET | Freq: Every day | ORAL | Status: DC
  Administered 2015-05-21 – 2015-05-24 (×4): 40 mg via ORAL
  Filled 2015-05-21 (×4): qty 2

## 2015-05-21 MED ORDER — DOCUSATE SODIUM 100 MG PO CAPS
100.0000 mg | ORAL_CAPSULE | Freq: Two times a day (BID) | ORAL | Status: DC
  Administered 2015-05-21 (×2): 100 mg via ORAL
  Filled 2015-05-21 (×2): qty 1

## 2015-05-21 MED ORDER — ENSURE ENLIVE PO LIQD
237.0000 mL | Freq: Two times a day (BID) | ORAL | Status: DC
  Administered 2015-05-21 – 2015-05-25 (×8): 237 mL via ORAL

## 2015-05-21 MED ORDER — GABAPENTIN 300 MG PO CAPS
300.0000 mg | ORAL_CAPSULE | Freq: Every morning | ORAL | Status: DC
  Administered 2015-05-21 – 2015-05-25 (×5): 300 mg via ORAL
  Filled 2015-05-21 (×5): qty 1

## 2015-05-21 MED ORDER — AMIODARONE HCL 200 MG PO TABS
200.0000 mg | ORAL_TABLET | Freq: Every day | ORAL | Status: DC
  Administered 2015-05-21 – 2015-05-25 (×5): 200 mg via ORAL
  Filled 2015-05-21 (×6): qty 1

## 2015-05-21 MED ORDER — LOSARTAN POTASSIUM 50 MG PO TABS
25.0000 mg | ORAL_TABLET | Freq: Every day | ORAL | Status: DC
  Administered 2015-05-21 – 2015-05-24 (×4): 25 mg via ORAL
  Filled 2015-05-21 (×7): qty 1

## 2015-05-21 MED ORDER — POLYETHYLENE GLYCOL 3350 17 G PO PACK
17.0000 g | PACK | Freq: Every day | ORAL | Status: DC | PRN
  Administered 2015-05-21: 17 g via ORAL
  Filled 2015-05-21: qty 1

## 2015-05-21 NOTE — Progress Notes (Signed)
Pt refuses to wear SCDS and Teds states she has fibromyalgia and they irritate her. Pt does have filters placed  Right and left groin per patient and is on .

## 2015-05-21 NOTE — H&P (Signed)
[facility] Consultation      Name: Hailey [name] MRN: [id number] DOB: [birth year]    ADMISSION DATE:  05/19/2015    CHIEF COMPLAINT:   Follow up Acute resp failure   HISTORY OF PRESENT ILLNESS  resp distress improved, alert and awake On high flow [place] fio2 50%,    pateint Review of Systems  Constitutional: Positive for malaise/fatigue. Negative for fever, chills and weight loss.  HENT: Positive for congestion.   Eyes: Negative for blurred vision and double vision.  Respiratory: Negative for cough, sputum production, shortness of breath and wheezing.   Cardiovascular: Negative for chest pain, palpitations, orthopnea and leg swelling.  Gastrointestinal: Negative for heartburn, nausea, vomiting and abdominal pain.  Musculoskeletal: Positive for joint pain. Negative for myalgias.  Skin: Negative for rash.  Neurological: Negative for headaches.  Psychiatric/Behavioral: The patient is not nervous/anxious.   All other systems reviewed and are negative.     VITAL SIGNS    Temp:  [98.2 F (36.8 C)-98.6 F (37 C)] 98.6 F (37 C) (04/05 1930) Pulse Rate:  [62-65] 62 (04/05 1600) Resp:  [17-21] 21 (04/05 1600) BP: (115-120)/(48-54) 120/48 mmHg (04/05 1600) SpO2:  [95 %-100 %] 98 % (04/06 0501) FiO2 (%):  [50 %-75 %] 50 % (04/06 0231) HEMODYNAMICS:   VENTILATOR SETTINGS: Vent Mode:  [-]  FiO2 (%):  [50 %-75 %] 50 % INTAKE / OUTPUT:  Intake/Output Summary (Last 24 hours) at 05/21/15 0825 Last data filed at 05/21/15 0610  Gross per 24 hour  Intake   1370 ml  Output   1370 ml  Net      0 ml       PHYSICAL EXAM   Physical Exam  Constitutional: She is oriented to person, place, and time. No distress.  HENT:  Head: Normocephalic and atraumatic.  Eyes: Pupils are equal, round, and reactive to light. No scleral icterus.  Neck: Normal range of motion. Neck supple.  Cardiovascular: Normal rate and regular rhythm.   No murmur heard. Pulmonary/Chest:  No respiratory distress. She has no wheezes. She has no rales.  Abdominal: Soft. She exhibits no distension. There is no tenderness.  Musculoskeletal: Normal range of motion. She exhibits edema.  Swan neck deformity of fingers  Neurological: She is alert and oriented to person, place, and time. She displays normal reflexes. Coordination normal.  Skin: Skin is warm. No rash noted. She is not diaphoretic.       LABS   LABS:  CBC  Recent Labs Lab 05/19/15 1611 05/20/15 0343  WBC 15.6* 11.0  HGB 11.1* 10.2*  HCT 32.8* 29.9*  PLT 188 167   Coag's No results for input(s): APTT, INR in the last 168 hours. BMET  Recent Labs Lab 05/19/15 1611  NA 138  K 3.0*  CL 103  CO2 25  BUN 16  CREATININE 0.94  GLUCOSE 164*   Electrolytes  Recent Labs Lab 05/19/15 1611  CALCIUM 9.3   Sepsis Markers  Recent Labs Lab 05/19/15 2019 05/19/15 2316 05/20/15 0343  LATICACIDVEN 2.9* 1.9 1.8   ABG  Recent Labs Lab 05/19/15 1717 05/20/15 0453  PHART 7.50* 7.51*  PCO2ART 34 35  PO2ART 74* 71*   Liver Enzymes  Recent Labs Lab 05/19/15 1611  AST 44*  ALT 28  ALKPHOS 72  BILITOT 0.8  ALBUMIN 4.4   Cardiac Enzymes  Recent Labs Lab 05/19/15 1611  TROPONINI 0.04*   Glucose No results for input(s): GLUCAP in the last 168 hours.  Recent Results (from the past 240 hour(s))  Blood Culture (routine x 2)     Status: None (Preliminary result)   Collection Time: 05/19/15  4:11 PM  Result Value Ref Range Status   Specimen Description BLOOD RIGHT HAND  Final   Special Requests   Final    BOTTLES DRAWN AEROBIC AND ANAEROBIC AERO 3CC ANA 2CC   Culture NO GROWTH < 24 HOURS  Final   Report Status PENDING  Incomplete  Blood Culture (routine x 2)     Status: None (Preliminary result)   Collection Time: 05/19/15  4:11 PM  Result Value Ref Range Status   Specimen Description BLOOD LEFT FATTY CASTS  Final   Special Requests   Final    BOTTLES DRAWN AEROBIC AND ANAEROBIC   AERO 1CC ANA 5CC   Culture NO GROWTH < 24 HOURS  Final   Report Status PENDING  Incomplete  Rapid Influenza A&B Antigens ([place] only)     Status: None   Collection Time: 05/19/15  4:11 PM  Result Value Ref Range Status   Influenza A ([place]) NEGATIVE NEGATIVE Final   Influenza B ([facility]) NEGATIVE NEGATIVE Final  Urine culture     Status: None (Preliminary result)   Collection Time: 05/19/15  4:55 PM  Result Value Ref Range Status   Specimen Description URINE, RANDOM  Final   Special Requests NONE  Final   Culture NO GROWTH < 24 HOURS  Final   Report Status PENDING  Incomplete  MRSA PCR Screening     Status: None   Collection Time: 05/19/15  6:34 PM  Result Value Ref Range Status   MRSA by PCR NEGATIVE NEGATIVE Final    Comment:        The GeneXpert MRSA Assay (FDA approved for NASAL specimens only), is one component of a comprehensive MRSA colonization surveillance program. It is not intended to diagnose MRSA infection nor to guide or monitor treatment for MRSA infections.      Current facility-administered medications:  .  0.9 %  sodium chloride infusion, 250 mL, Intravenous, PRN, Flora Lipps, MD .  0.9 %  sodium chloride infusion, 250 mL, Intravenous, PRN, Flora Lipps, MD .  acetaminophen (TYLENOL) tablet 650 mg, 650 mg, Oral, Q4H PRN, Flora Lipps, MD, 650 mg at 05/20/15 1405 .  albuterol (PROVENTIL) (2.5 MG/3ML) 0.083% nebulizer solution 2.5 mg, 2.5 mg, Nebulization, Q4H, Flora Lipps, MD, 2.5 mg at 05/21/15 0501 .  budesonide (PULMICORT) nebulizer solution 0.5 mg, 0.5 mg, Nebulization, BID, Flora Lipps, MD, 0.5 mg at 05/20/15 2106 .  enoxaparin (LOVENOX) injection 40 mg, 40 mg, Subcutaneous, Q24H, Flora Lipps, MD, 40 mg at 05/20/15 1755 .  famotidine (PEPCID) IVPB 20 mg premix, 20 mg, Intravenous, Q12H, Flora Lipps, MD, 20 mg at 05/20/15 2058 .  methylPREDNISolone sodium succinate (SOLU-MEDROL) 40 mg/mL injection 40 mg, 40 mg, Intravenous, Q12H, Flora Lipps, MD, 40 mg at  05/21/15 0514 .  mometasone-formoterol (DULERA) 100-5 MCG/ACT inhaler 2 puff, 2 puff, Inhalation, BID, Flora Lipps, MD, 2 puff at 05/20/15 2058 .  ondansetron (ZOFRAN) injection 4 mg, 4 mg, Intravenous, Q6H PRN, Flora Lipps, MD .  piperacillin-tazobactam (ZOSYN) IVPB 3.375 g, 3.375 g, Intravenous, 3 times per day, Flora Lipps, MD, 3.375 g at 05/21/15 0514 .  sodium chloride flush (NS) 0.9 % injection 3 mL, 3 mL, Intravenous, Q12H, Flora Lipps, MD, 3 mL at 05/19/15 2214 .  sodium chloride flush (NS) 0.9 % injection 3 mL, 3 mL, Intravenous, PRN, Flora Lipps, MD .  tiotropium (SPIRIVA) inhalation capsule 18 mcg, 18 mcg, Inhalation, Daily, Flora Lipps, MD, 18 mcg at 05/20/15 0927  IMAGING    No results found.   INDWELLING DEVICES::  MICRO DATA: MRSA PCR NEGATIVE Urine  Blood Resp   ANTIMICROBIALS: Vanc/zosyn>>>4/4    ASSESSMENT/PLAN   82 yo white female admitted to ICU for acute hypoxic resp failure from acute Left Lung pneumonia with COPD exacerbation  PULMONARY -Respiratory Failure-slowly improving -oxygen support -continue IV steroids, IV abx and aggressive BD therapy   CARDIOVASCULAR BP stable -IVF's if needed  RENAL Follow UO  GASTROINTESTINAL diet as tolerated  HEMATOLOGIC Follow CBC  INFECTIOUS Pneumonia -vanc and zosyn, blood cultures and sputum cultures Pending - stopping vanc since MRSA screen neg  After discussion with patient, SHE DOES NOT AGGRESSIVE MEASURES TO KEEP HER ALIVE, PATIENT IS NOW DNR/DNI     The Patient requires high complexity decision making for assessment and support, frequent evaluation and titration of therapies, application of advanced monitoring technologies and extensive interpretation of multiple databases.  OK to transfer to gen med floor today. Will contact Hospitalist for Transfer of service    Romy Mcgue Patricia Pesa, M.D.  [facility]  Medical Director [place] Director  [facility] Cardio-Pulmonary Department

## 2015-05-21 NOTE — Progress Notes (Signed)
Initial Nutrition Assessment  DOCUMENTATION CODES:   Obesity unspecified  INTERVENTION:  -Monitor intake -Recommend Ensure Enlive po BID, each supplement provides 350 kcal and 20 grams of protein   NUTRITION DIAGNOSIS:   Inadequate oral intake related to poor appetite as evidenced by per patient/family report.    GOAL:   Patient will meet greater than or equal to 90% of their needs    MONITOR:   PO intake, Supplement acceptance  REASON FOR ASSESSMENT:   Consult Assessment of nutrition requirement/status  ASSESSMENT:   82 y/o female admitted with acute respiratory failure, left lung pneumonia and COPD exacerbation  Past Medical History  Diagnosis Date  . DVT (deep vein thrombosis) in pregnancy 09/2008    a. LLE, recurrent hx, has IVC filter. Not on coumadin now with history of GI bleeding  . Hyperlipidemia   . GERD (gastroesophageal reflux disease)   . Hypertrophic cardiomyopathy ([place])     a. Echo 4/09 with EF 70-75%, asymmetricy basal septal hypertrophy, mild MR without systolic anterior motion of the mitral valve, LVOT gradient to 130 mmHg with Valsalva b. Echo 7/13: EF 60-65%, mild focal basal septal hypertrophy, no significant LVOT gradient, no MV SAM c. Echo 2/15: EF 55-60%, HOCM, resting LVOT gradient 29 mmHg, Valsalva LVOT gradient > 140 mmHg, mild LVH, mild TR, elevated PASP  . Anemia     Chronic  . Osteoarthritis     Hx of left TKR  . Fibromyalgia   . Cerebral aneurysm     Hx of  . Migraine headache     Hx of  . Hypertension   . COPD (chronic obstructive pulmonary disease) ([place])   . PUD (peptic ulcer disease)     With GI bleeding  . History of colonoscopy   . PAF (paroxysmal atrial fibrillation) ([place])     a. Full-dose ASA alone 2/2 h/o GIB.  [place] Kitchen OSA (obstructive sleep apnea)     a. Intolerant to CPAP, wears 2L via n/c  . Fibrocystic disease of breast   . Anxiety   . PAT (paroxysmal atrial tachycardia) ([place])   . Pneumonia   . 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 Asthma   . CHF (congestive heart failure) ([place])   . Arthritis   . Tremor   . Heart murmur   . Diverticulitis   . Pulmonary embolism ([place])   . Atrophic vaginitis   . Incomplete bladder emptying   . Gross hematuria   . Obesity   . Gout     Pt reports appetite has been decreased for the past several months.  Reports ate few bites of everything this am (eggs, bacon, grits)  Medications reviewed:solumedrol, miralax Labs reviewed: K 3.0, glucose 164  Nutrition-Focused physical exam completed. Findings are no fat depletion, mild (temporal area only) muscle depletion, and mild edema.     Diet Order:  Diet regular Room service appropriate?: Yes; Fluid consistency:: Thin  Skin:  Reviewed, no issues  Last BM:  unsure  Height:   Ht Readings from Last 1 Encounters:  05/20/15 5\' 4"  (1.626 m)    Weight: 4% wt loss in the last 10 months (pt reports fluid wt loss)  Wt Readings from Last 1 Encounters:  05/20/15 180 lb 12.4 oz (82 kg)    Ideal Body Weight:     BMI:  Body mass index is 31.02 kg/(m^2).  Estimated Nutritional Needs:   Kcal:  [id number] kcals/d  Protein:  82-98 g/d  Fluid:  1.6-1.9 L/d  EDUCATION NEEDS:   No education needs identified at this time  Stanley Helmuth B. Zenia Resides, [place], [place] (pager) Weekend/On-Call pager [phone])

## 2015-05-22 LAB — CREATININE, SERUM
CREATININE: 0.79 mg/dL (ref 0.44–1.00)
GFR calc Af Amer: >60 mL/min (ref >60)

## 2015-05-22 MED ORDER — PANTOPRAZOLE SODIUM 40 MG PO TBEC
40.0000 mg | DELAYED_RELEASE_TABLET | Freq: Every day | ORAL | Status: DC
  Administered 2015-05-23 – 2015-05-25 (×3): 40 mg via ORAL
  Filled 2015-05-22 (×3): qty 1

## 2015-05-22 MED ORDER — POLYETHYLENE GLYCOL 3350 17 G PO PACK
17.0000 g | PACK | Freq: Every day | ORAL | Status: DC
  Administered 2015-05-22 – 2015-05-24 (×3): 17 g via ORAL
  Filled 2015-05-22 (×4): qty 1

## 2015-05-22 MED ORDER — DULOXETINE HCL 30 MG PO CPEP
90.0000 mg | ORAL_CAPSULE | Freq: Every day | ORAL | Status: DC
  Administered 2015-05-22 – 2015-05-25 (×4): 90 mg via ORAL
  Filled 2015-05-22 (×5): qty 3

## 2015-05-22 MED ORDER — METHYLPREDNISOLONE SODIUM SUCC 40 MG IJ SOLR
40.0000 mg | INTRAMUSCULAR | Status: DC
  Administered 2015-05-23 – 2015-05-24 (×2): 40 mg via INTRAVENOUS
  Filled 2015-05-22 (×2): qty 1

## 2015-05-22 MED ORDER — CARBIDOPA-LEVODOPA ER 25-100 MG PO TBCR
1.0000 | EXTENDED_RELEASE_TABLET | Freq: Two times a day (BID) | ORAL | Status: DC
  Administered 2015-05-22 – 2015-05-25 (×7): 1 via ORAL
  Filled 2015-05-22 (×7): qty 1

## 2015-05-22 MED ORDER — SENNA 8.6 MG PO TABS
2.0000 | ORAL_TABLET | Freq: Two times a day (BID) | ORAL | Status: DC
  Administered 2015-05-22 – 2015-05-25 (×7): 17.2 mg via ORAL
  Filled 2015-05-22 (×7): qty 2

## 2015-05-22 MED ORDER — AMOXICILLIN-POT CLAVULANATE 875-125 MG PO TABS
1.0000 | ORAL_TABLET | Freq: Two times a day (BID) | ORAL | Status: DC
  Administered 2015-05-22 – 2015-05-25 (×6): 1 via ORAL
  Filled 2015-05-22 (×6): qty 1

## 2015-05-22 MED ORDER — HYDROCODONE-HOMATROPINE 5-1.5 MG/5ML PO SYRP
5.0000 mL | ORAL_SOLUTION | ORAL | Status: DC | PRN
  Administered 2015-05-22 – 2015-05-24 (×4): 5 mL via ORAL
  Filled 2015-05-22 (×4): qty 5

## 2015-05-22 NOTE — Evaluation (Signed)
Occupational Therapy Evaluation Patient Details Name: PAYZLEE CARBON MRN: [id number] DOB: Jul 04, 1932 Today's Date: 05/22/2015    History of Present Illness Pt is a 82 y.o. female with PMH of COPD, chronic home O2 (used at night 2 L/min O2 via nasal cannula), T12 compression fracture, and fibromyalgia.  Pt presented with SOB, altered mental status and LE swelling.  Pt was admitted for acute repsiratory failure and pneumonia.     Clinical Impression   Pt. Is an 82 y.o. Female who was admitted to [facility] with Respiratory Failure. Pt. has had a recent history of pneumonia and the flu. Pt. presents with weakness, limited endurance, and activity tolerance which hinder her ability to complete ADLs, and IADLs efficiently. Pt. requires skilled OT services to work on improving strength, endurance, and to provide education about energy conservation/work simplification techniques. Pt. is currently on 4L O2.     Follow Up Recommendations  SNF    Equipment Recommendations       Recommendations for Other Services PT consult     Precautions / Restrictions Precautions Precautions: Fall Restrictions Weight Bearing Restrictions: No      Mobility Bed Mobility        Transfers         General transfer comment: increased time     Balance Overall balance assessment: Needs assistance Sitting-balance support: Feet supported;Single extremity supported (bed rail ) Sitting balance-Leahy Scale: Good     Standing balance support: Bilateral upper extremity supported (RW ) Standing balance-Leahy Scale: Fair                              ADL Overall ADL's : Needs assistance/impaired Eating/Feeding: Independent   Grooming: Independent           Upper Body Dressing : Minimal assistance (gown)   Lower Body Dressing: Minimal assistance (with a/e use)                 General ADL Comments: Functional transfers with MinA     Vision     Perception     Praxis       Pertinent Vitals/Pain Pain Assessment: 0-10 Pain Score: 6  Pain Descriptors / Indicators: Aching;Constant;Grimacing Pain Intervention(s): Limited activity within patient's tolerance;Monitored during session     Hand Dominance Right   Extremity/Trunk Assessment Upper Extremity Assessment Upper Extremity Assessment: Overall WFL for tasks assessed (History of Bilateral Rotator Cuff Injuries with repairs.)      Cervical / Trunk Assessment Cervical / Trunk Assessment: Normal   Communication Communication Communication: No difficulties   Cognition Arousal/Alertness: Awake/alert Behavior During Therapy: WFL for tasks assessed/performed Overall Cognitive Status: Within Functional Limits for tasks assessed                     General Comments       Exercises       Shoulder Instructions      Home Living Family/patient expects to be discharged to:: Other (Comment) ([place])                             Home Equipment: Environmental consultant - 4 wheels          Prior Functioning/Environment Level of Independence: Independent with assistive device(s)        Comments: Pt. recently has had home health aides in to assist with morning care since onset of the  flu and pneumonia per pt. report.    OT Diagnosis: Generalized weakness   OT Problem List: Decreased strength;Decreased knowledge of use of DME or AE;Decreased activity tolerance;Impaired UE functional use;Decreased range of motion;Impaired balance (sitting and/or standing);Decreased safety awareness   OT Treatment/Interventions: Self-care/ADL training;Therapeutic exercise;DME and/or AE instruction;Neuromuscular education;Manual therapy;Patient/family education;Therapeutic activities;Energy conservation    OT Goals(Current goals can be found in the care plan section) Acute Rehab OT Goals Patient Stated Goal: To regain independence OT Goal Formulation: With patient Time For Goal  Achievement: 06/05/15 Potential to Achieve Goals: Good  OT Frequency: Min 2X/week   Barriers to D/C:            Co-evaluation              End of Session Equipment Utilized During Treatment: Gait belt  Activity Tolerance: Patient tolerated treatment well Patient left: in chair;Other (comment);with call bell/phone within reach;with chair alarm set (Respiratory Therapy)   Time: [id number] OT Time Calculation (min): 25 min Charges:  OT General Charges $OT Visit: 1 Procedure OT Evaluation $OT Eval Moderate Complexity: 1 Procedure G-Codes:    Harrel Carina, MS, OTR/L Harrel Carina 05/22/2015, 4:41 PM

## 2015-05-22 NOTE — Progress Notes (Signed)
[place] [facility] [place] NAME: Hailey [name]    MR#:  [id number]  DATE OF BIRTH:  1932/03/30  SUBJECTIVE:  Breathing somewhat better, on 4 liters N.C. But coughing bad that her ribs- abd muscles sore and requesting some meds for same. Also constipated and reports tremors and falls REVIEW OF SYSTEMS:    Review of Systems  Constitutional: Positive for malaise/fatigue. Negative for fever and chills.  HENT: Negative for sore throat.   Eyes: Negative for blurred vision.  Respiratory: Positive for cough and shortness of breath. Negative for hemoptysis and wheezing.   Cardiovascular: Negative for chest pain, palpitations and leg swelling.  Gastrointestinal: Positive for constipation. Negative for nausea, vomiting, abdominal pain, diarrhea and blood in stool.  Genitourinary: Negative for dysuria.  Musculoskeletal: Positive for back pain and joint pain.  Neurological: Positive for weakness. Negative for dizziness, tremors and headaches.  Endo/Heme/Allergies: Does not bruise/bleed easily.    Tolerating Diet: Yes DRUG ALLERGIES:   Allergies  Allergen Reactions  . Aspirin Other (See Comments)    Reaction:  GI bleeding   . Codeine Nausea And Vomiting  . Ketorolac Tromethamine Other (See Comments)    Reaction:  Headache   . Nsaids Other (See Comments)    Reaction:  GI bleeding   . Phenazopyridine Hcl Other (See Comments)    Reaction:  Vision problems     VITALS:  Blood pressure 127/45, pulse 57, temperature 98 F (36.7 C), temperature source Oral, resp. rate 18, height 5\' 4"  (1.626 m), weight 83 kg (182 lb 15.7 oz), SpO2 94 %.  PHYSICAL EXAMINATION:   Physical Exam  Constitutional: She is oriented to person, place, and time and well-developed, well-nourished, and in no distress. No distress.  HENT:  Head: Normocephalic.  Eyes: No scleral icterus.  Neck: Normal range of motion. Neck supple. No JVD present. No tracheal deviation present.   Cardiovascular: Normal rate, regular rhythm and normal heart sounds.  Exam reveals no gallop and no friction rub.   No murmur heard. Pulmonary/Chest: Effort normal and breath sounds normal. No respiratory distress. She has no wheezes. She has no rales. She exhibits no tenderness.  Abdominal: Soft. Bowel sounds are normal. She exhibits no distension and no mass. There is no tenderness. There is no rebound and no guarding.  Musculoskeletal: Normal range of motion. She exhibits no edema.  Neurological: She is alert and oriented to person, place, and time. She displays tremor.  Skin: Skin is warm. No rash noted. No erythema.  Psychiatric: Affect and judgment normal.   LABORATORY PANEL:   CBC  Recent Labs Lab 05/20/15 0343  WBC 11.0  HGB 10.2*  HCT 29.9*  PLT 167   ------------------------------------------------------------------------------------------------------------------  Chemistries   Recent Labs Lab 05/19/15 1611  NA 138  K 3.0*  CL 103  CO2 25  GLUCOSE 164*  BUN 16  CREATININE 0.94  CALCIUM 9.3  AST 44*  ALT 28  ALKPHOS 72  BILITOT 0.8   ------------------------------------------------------------------------------------------------------------------  Cardiac Enzymes  Recent Labs Lab 05/19/15 1611  TROPONINI 0.04*   ASSESSMENT AND PLAN:  82 year old female with early dementia, PAF and recent vertebral fracture who admitted to the hospital with for acute hypoxic resp failure from Left Lung pneumonia with COPD exacerbation  * Acute hypoxic resp failure from Left Lung pneumonia with COPD exacerbation - Slowly improving - continue oxygen support, wean as tolerated (uses 2 liters at home mainly at night and as need) - continue IV steroids,  IV abx and aggressive BD therapy  * Pneumonia - on Zosyn  * COPD exacerbation - continue oxygen support, wean as tolerated (uses 2 liters at home mainly at night and as need) - continue IV steroids, IV abx and  aggressive BD therapy  * Possible parkinson's dz - has tremors, recurrent falls - will start low dose sinemet to see if it helps - recommend outpt neuro f/up  * Hypokalemia - no labs for last 3 days, will recheck and replete if low  * Recent Vertebral compression fracture: on recent admission, Dr. Mack Guise - orthopedics has advised conservative management. On Oxycodone prn  * Essential hypertension: Continue losartan and metoprolol. Well controlled  * PAF: Continue amiodarone. She is NOT on aspirin due to history of GI bleed  * constipation: prn stool softners  * Neuropathy: on gabapentin and cymbalta   Management plans discussed with the patient and she is in agreement.  CODE STATUS: DNR  TOTAL TIME TAKING CARE OF THIS PATIENT: 30 minutes.     POSSIBLE D/C 2-3 DAYS, DEPENDING ON CLINICAL CONDITION.   [facility], Hailey [name] M.D on 05/22/2015 at 8:48 AM  Between 7am to 6pm - Pager - [phone] After 6pm go to [email or web address] - password [email or web address] [facility]  Office  [phone]  CC: Primary care physician; BABAOFF, Caryl Bis, MD  Note: This dictation was prepared with Dragon dictation along with smaller phrase technology. Any transcriptional errors that result from this process are unintentional.

## 2015-05-22 NOTE — Care Management Note (Addendum)
Case Management Note  Patient Details  Name: Hailey [name] MRN: [id number] Date of Birth: 11-Jul-1932  Subjective/Objective:                   Met with patient to discuss discharge planning. Patient lives alone at independent living [facility]. They provide her with transportation and so does her daughter and son that live locally. [facility]. Patient would benefit from PT evaluation while here. She is on chronic O2 through [facility] also. She has a rollator that she ambulates with. Her PCP [facility] in [place]. Action/Plan:  Referral made to [place]. RNCM will continue to follow.   Expected Discharge Date:                  Expected Discharge Plan:     In-House Referral:     Discharge planning Services  CM Consult  Post Acute Care Choice:  Home Health Choice offered to:  Patient  DME Arranged:    DME Agency:     HH Arranged:  PT, RN [place] Agency:  [place]  Status of Service:  In process, will continue to follow  Medicare Important Message Given:  Yes Date Medicare IM Given:    Medicare IM give by:    Date Additional Medicare IM Given:    Additional Medicare Important Message give by:     If discussed at [place] of Stay Meetings, dates discussed:    Additional Comments:  Marshell Garfinkel, RN 05/22/2015, 12:38 PM

## 2015-05-22 NOTE — Evaluation (Signed)
Physical Therapy Evaluation Patient Details Name: ZHOEY DRAKES MRN: [id number] DOB: 06-Apr-1932 Today's Date: 05/22/2015   History of Present Illness  Pt is a 82 y.o. female with PMH of COPD, chronic home O2 (used at night 2 L/min O2 via nasal cannula), T12 compression fracture, and fibromyalgia.  Pt presented with SOB, altered mental status and LE swelling.  Pt was admitted for acute respiratory failure and pneumonia.      Clinical Impression  Prior to admission pt was independent with rollator; however, pt reported that she has had one fall in the last 6 months.  Pt lives in an independent living facility.  Pt was CGA for bed mobility, min assist with sit to stand with RW and CGA for ambulation with RW for 3 feet.  Pt's vitals during session were: O2 saturation: beginning of session: 92% O2, after ambulation: 90% O2 and end of session: 94% O2 (all on 4 L/min O2 via nasal cannula). Pt reported that she was tired at the end of the session.  Due to aforementioned function and strength deficits, pt is in need of skilled physical therapy.  It is recommended that pt is discharged to SNF (pending progress) when medically appropriate.     Follow Up Recommendations SNF (Pending progress)    Equipment Recommendations  None recommended by PT    Recommendations for Other Services       Precautions / Restrictions Precautions Precautions: Fall Restrictions Weight Bearing Restrictions: No      Mobility  Bed Mobility Overal bed mobility: Needs Assistance Bed Mobility: Rolling;Sidelying to Sit Rolling: Min guard Sidelying to sit: Min guard       General bed mobility comments: increased time, VC's for hand placement   Transfers Overall transfer level: Needs assistance Equipment used: Rolling walker (2 wheeled) Transfers: Sit to/from Stand Sit to Stand: Min assist         General transfer comment: increased time   Ambulation/Gait Ambulation/Gait assistance: Min guard Ambulation  Distance (Feet): 3 Feet Assistive device: Rolling walker (2 wheeled) Gait Pattern/deviations: Step-to pattern;Decreased step length - right;Decreased step length - left;Trunk flexed Gait velocity: decreased       Stairs            Wheelchair Mobility    Modified Rankin (Stroke Patients Only)       Balance Overall balance assessment: Needs assistance Sitting-balance support: Feet supported;Single extremity supported (bed rail ) Sitting balance-Leahy Scale: Fair     Standing balance support: Bilateral upper extremity supported (RW ) Standing balance-Leahy Scale: Fair                               Pertinent Vitals/Pain Pain Assessment: 0-10 Pain Score: 6  Pain Descriptors / Indicators: Aching;Constant;Grimacing (Lungs, upper back) Pain Intervention(s): Limited activity within patient's tolerance;Monitored during session  See flow sheet for vitals.     Home Living Family/patient expects to be discharged to:: Assisted living               Home Equipment: Walker - 4 wheels      Prior Function Level of Independence: Independent with assistive device(s) (rollator )         Comments: Pt reported that she has had one fall in the last 6 months     Hand Dominance        Extremity/Trunk Assessment   Upper Extremity Assessment: Overall WFL for tasks assessed  Lower Extremity Assessment: Generalized weakness      Cervical / Trunk Assessment: Normal  Communication   Communication: No difficulties  Cognition Arousal/Alertness: Awake/alert Behavior During Therapy: WFL for tasks assessed/performed Overall Cognitive Status: Within Functional Limits for tasks assessed                      General Comments   Nursing was contacted and cleared pt for physical therapy.  Pt was agreeable and session was modified due to fatigue.  OT was present at the end of the session.       Exercises        Assessment/Plan    PT  Assessment Patient needs continued PT services  PT Diagnosis Generalized weakness   PT Problem List Decreased strength;Decreased activity tolerance;Decreased mobility;Pain  PT Treatment Interventions DME instruction;Gait training;Functional mobility training;Therapeutic activities;Therapeutic exercise   PT Goals (Current goals can be found in the Care Plan section) Acute Rehab PT Goals Patient Stated Goal: to go back to her facility  PT Goal Formulation: With patient Time For Goal Achievement: 06/05/15 Potential to Achieve Goals: Fair    Frequency Min 2X/week   Barriers to discharge        Co-evaluation               End of Session Equipment Utilized During Treatment: Gait belt;Oxygen (4 L/min O2 via nasal cannula) Activity Tolerance: Patient limited by fatigue Patient left: in chair;with call bell/phone within reach;with chair alarm set Nurse Communication: Mobility status;Precautions         Time: [id number] PT Time Calculation (min) (ACUTE ONLY): 23 min   Charges:         PT G Codes:       Mittie Bodo, SPT Mittie Bodo 05/22/2015, 4:24 PM

## 2015-05-22 NOTE — Care Management Important Message (Signed)
Important Message  Patient Details  Name: Hailey [name] MRN: [id number] Date of Birth: 10-15-1932   Medicare Important Message Given:  Yes    Juliann Pulse A Gokul Waybright 05/22/2015, 10:11 AM

## 2015-05-23 LAB — CBC
HEMATOCRIT: 29.2 % — AB (ref 35.0–47.0)
HEMOGLOBIN: 9.9 g/dL — AB (ref 12.0–16.0)
MCH: 31.8 pg (ref 26.0–34.0)
MCHC: 33.8 g/dL (ref 32.0–36.0)
MCV: 94.2 fL (ref 80.0–100.0)
Platelets: 228 10*3/uL (ref 150–440)
RBC: 3.1 MIL/uL — ABNORMAL LOW (ref 3.80–5.20)
RDW: 14.6 % — AB (ref 11.5–14.5)
WBC: 14.3 10*3/uL — ABNORMAL HIGH (ref 3.6–11.0)

## 2015-05-23 LAB — BASIC METABOLIC PANEL
Anion gap: 7 (ref 5–15)
BUN: 20 mg/dL (ref 6–20)
CHLORIDE: 104 mmol/L (ref 101–111)
CO2: 26 mmol/L (ref 22–32)
CREATININE: 0.64 mg/dL (ref 0.44–1.00)
Calcium: 9.9 mg/dL (ref 8.9–10.3)
GFR calc Af Amer: >60 mL/min (ref >60)
GFR calc non Af Amer: >60 mL/min (ref >60)
Glucose, Bld: 182 mg/dL — ABNORMAL HIGH (ref 65–99)
Potassium: 3.4 mmol/L — ABNORMAL LOW (ref 3.5–5.1)
Sodium: 137 mmol/L (ref 135–145)

## 2015-05-23 LAB — RHEUMATOID FACTOR: Rhuematoid fact SerPl-aCnc: 13.6 IU/mL (ref 0.0–13.9)

## 2015-05-23 MED ORDER — POTASSIUM CHLORIDE 20 MEQ PO PACK
40.0000 meq | PACK | Freq: Once | ORAL | Status: AC
  Administered 2015-05-23: 40 meq via ORAL
  Filled 2015-05-23: qty 2

## 2015-05-23 MED ORDER — ZOLPIDEM TARTRATE 5 MG PO TABS
5.0000 mg | ORAL_TABLET | Freq: Every evening | ORAL | Status: DC | PRN
  Administered 2015-05-23: 5 mg via ORAL
  Filled 2015-05-23: qty 1

## 2015-05-23 MED ORDER — ALBUTEROL SULFATE (2.5 MG/3ML) 0.083% IN NEBU
2.5000 mg | INHALATION_SOLUTION | Freq: Four times a day (QID) | RESPIRATORY_TRACT | Status: DC
  Administered 2015-05-23 – 2015-05-25 (×6): 2.5 mg via RESPIRATORY_TRACT
  Filled 2015-05-23 (×6): qty 3

## 2015-05-23 NOTE — Progress Notes (Signed)
[place] at [place] NAME: Hailey [name]    MR#:  [id number]  DATE OF BIRTH:  [birth year]  SUBJECTIVE:  Breathing somewhat better, on 4 liters N.C. still continues to cough. Didn't sleep well last night requesting Ambien, feeling weak and tired REVIEW OF SYSTEMS:    Review of Systems  Constitutional: Positive for malaise/fatigue. Negative for fever and chills.  HENT: Negative for sore throat.   Eyes: Negative for blurred vision.  Respiratory: Positive for cough and shortness of breath. Negative for hemoptysis and wheezing.   Cardiovascular: Negative for chest pain, palpitations and leg swelling.  Gastrointestinal: Positive for constipation. Negative for nausea, vomiting, abdominal pain, diarrhea and blood in stool.  Genitourinary: Negative for dysuria.  Musculoskeletal: Positive for back pain and joint pain.  Neurological: Positive for weakness. Negative for dizziness, tremors and headaches.  Endo/Heme/Allergies: Does not bruise/bleed easily.    Tolerating Diet: Yes DRUG ALLERGIES:   Allergies  Allergen Reactions  . Aspirin Other (See Comments)    Reaction:  GI bleeding   . Codeine Nausea And Vomiting  . Ketorolac Tromethamine Other (See Comments)    Reaction:  Headache   . Nsaids Other (See Comments)    Reaction:  GI bleeding   . Phenazopyridine Hcl Other (See Comments)    Reaction:  Vision problems     VITALS:  Blood pressure 134/52, pulse 50, temperature 98.7 F (37.1 C), temperature source Oral, resp. rate 16, height 5\' 4"  (1.626 m), weight 86.32 kg (190 lb 4.8 oz), SpO2 96 %.  PHYSICAL EXAMINATION:   Physical Exam  Constitutional: She is oriented to person, place, and time and well-developed, well-nourished, and in no distress. No distress.  HENT:  Head: Normocephalic.  Eyes: No scleral icterus.  Neck: Normal range of motion. Neck supple. No JVD present. No tracheal deviation present.  Cardiovascular: Normal rate,  regular rhythm and normal heart sounds.  Exam reveals no gallop and no friction rub.   No murmur heard. Pulmonary/Chest: Effort normal and breath sounds normal. No respiratory distress. She has no wheezes. She has no rales. She exhibits no tenderness.  Abdominal: Soft. Bowel sounds are normal. She exhibits no distension and no mass. There is no tenderness. There is no rebound and no guarding.  Musculoskeletal: Normal range of motion. She exhibits no edema.  Neurological: She is alert and oriented to person, place, and time. She displays tremor.  Skin: Skin is warm. No rash noted. No erythema.  Psychiatric: Affect and judgment normal.   LABORATORY PANEL:   CBC  Recent Labs Lab 05/23/15 0328  WBC 14.3*  HGB 9.9*  HCT 29.2*  PLT 228   ------------------------------------------------------------------------------------------------------------------  Chemistries   Recent Labs Lab 05/19/15 1611  05/23/15 0328  NA 138  --  137  K 3.0*  --  3.4*  CL 103  --  104  CO2 25  --  26  GLUCOSE 164*  --  182*  BUN 16  --  20  CREATININE 0.94  < > 0.64  CALCIUM 9.3  --  9.9  AST 44*  --   --   ALT 28  --   --   ALKPHOS 72  --   --   BILITOT 0.8  --   --   < > = values in this interval not displayed. ------------------------------------------------------------------------------------------------------------------  Cardiac Enzymes  Recent Labs Lab 05/19/15 1611  TROPONINI 0.04*   ASSESSMENT AND PLAN:  82 year old female with early dementia, PAF  and recent vertebral fracture who admitted to the hospital with for acute hypoxic resp failure from Left Lung pneumonia with COPD exacerbation  * Acute hypoxic resp failure from Left Lung pneumonia with COPD exacerbation - Slowly improving - continue oxygen support, wean as tolerated (uses 2 liters at home mainly at night and as need) - continue IV steroid tapering, IV abx changed to by mouth Augmentin and aggressive BD therapy  *  Pneumonia - on Zosyn which was changed to Augmentin Urine culture with no growth  * COPD exacerbation - continue oxygen support, wean as tolerated (uses 2 liters at home mainly at night and as need) - continue IV steroids, currently getting 40 mg IV every 24 hours  and a, antibioticsggressive BD therapy  * Possible parkinson's dz - has tremors, recurrent falls - will start low dose sinemet to see if it helps - recommend outpt neuro f/up  * Hypokalemia - no labs for last 3 days, will recheck and replete if low -Provide potassium supplements when necessary   * Recent Vertebral compression fracture: on recent admission, Dr. Mack Guise - orthopedics has advised conservative management. On Oxycodone prn  * Essential hypertension: Continue losartan and metoprolol. Well controlled  * PAF: Continue amiodarone. She is NOT on aspirin due to history of GI bleed  * constipation: prn stool softners  * Neuropathy: on gabapentin and cymbalta  *Insomnia-Ambien as needed  *generalized weakness PT evaluation is recommending skilled nursing facility. Will follow up with care management.  Management plans discussed with the patient and she is in agreement.  CODE STATUS: DNR  TOTAL TIME TAKING CARE OF THIS PATIENT: 30 minutes.     POSSIBLE D/C 2-3 DAYS, DEPENDING ON CLINICAL CONDITION.   Nicholes Mango M.D on 05/23/2015 at 1:04 PM  Between 7am to 6pm - Pager - [phone] After 6pm go to [email or web address] - password [email or web address] [facility]  Office  [phone]  CC: Primary care physician; BABAOFF, Caryl Bis, MD  Note: This dictation was prepared with Dragon dictation along with smaller phrase technology. Any transcriptional errors that result from this process are unintentional.

## 2015-05-23 NOTE — Progress Notes (Signed)
Occupational Therapy Treatment Patient Details Name: Hailey [name] MRN: [id number] DOB: 06-Mar-1932 Today's Date: 05/23/2015    History of present illness Pt is a 82 y.o. female with PMH of COPD, chronic home O2 (used at night 2 L/min O2 via nasal cannula), T12 compression fracture, and fibromyalgia.  Pt presented with SOB, altered mental status and LE swelling.  Pt was admitted for acute respiratory failure and pneumonia.     OT comments  Patient was supine in bed when OT arrived. Nursing was just finishing providing medication. O2 sats in high 90's with nasal cannula O2. Patient stated she didn't feel very good when OT arrived, but agreed to treatment. By end of treatment stated she didn't feel as bad. Patient performed supine to sit with Min guard and extra time. Able to sit at edge of bed and maintain balance once cued by OT to adjust sitting posture, and bed was laid flat. Patient noted to have nonproductive cough when first sat up, but this decreased while sitting upright unsupported. Patient stated this made her feel better. Patient educated on pursed lip breathing and able to perform with minimal cues. Educated on energy conservation techniques, and discussed home setup and routine with OT for energy conservation and adaptive equipment. Patient has fair plus to good awareness of adaptations she can use for energy conservation. Patient educated on importance of continued light exercise/activity within tolerance to assist with increasing endurance. Patient verbalized understanding and able to review activities with OT. Patient able to manage sitting edge of bed unsupported for 30 minutes without loss of balance or extreme fatigue. Patient able to perform sit to supine with Min guard, and verbal cues for adjusting properly in bed. Patient would benefit from continued OT services with focus on energy conservation and adaptation for increased ADL performance.    Follow Up Recommendations  SNF     Equipment Recommendations       Recommendations for Other Services      Precautions / Restrictions Precautions Precautions: Fall Restrictions Weight Bearing Restrictions: No       Mobility Bed Mobility Overal bed mobility: Needs Assistance Bed Mobility: Supine to Sit     Supine to sit: Min guard        Transfers                      Balance                                   ADL Overall ADL's : Needs assistance/impaired Eating/Feeding: Independent                                            Vision                     Perception     Praxis      Cognition   Behavior During Therapy: WFL for tasks assessed/performed Overall Cognitive Status: Within Functional Limits for tasks assessed                       Extremity/Trunk Assessment               Exercises     Shoulder Instructions       General Comments  Pertinent Vitals/ Pain       Pain Assessment: No/denies pain  Home Living                                          Prior Functioning/Environment              Frequency Min 2X/week     Progress Toward Goals  OT Goals(current goals can now be found in the care plan section)  Progress towards OT goals: Progressing toward goals  Acute Rehab OT Goals Patient Stated Goal: To regain independence OT Goal Formulation: With patient Time For Goal Achievement: 06/05/15 Potential to Achieve Goals: Good  Plan Discharge plan remains appropriate    Co-evaluation                 End of Session     Activity Tolerance Patient tolerated treatment well   Patient Left in bed;with call bell/phone within reach;with bed alarm set   Nurse Communication          Time: [id number] OT Time Calculation (min): 35 min  Charges: OT General Charges $OT Visit: 1 Procedure OT Treatments $Self Care/Home Management : 8-22 mins $Therapeutic Activity: 8-22  mins  Clelia Trabucco L 05/23/2015, 2:03 PM  Amie [place], OTR/L

## 2015-05-24 DIAGNOSIS — R0902 Hypoxemia: Secondary | ICD-10-CM

## 2015-05-24 LAB — CULTURE, BLOOD (ROUTINE X 2)
CULTURE: NO GROWTH
Culture: NO GROWTH

## 2015-05-24 LAB — BASIC METABOLIC PANEL
ANION GAP: 5 (ref 5–15)
BUN: 21 mg/dL — ABNORMAL HIGH (ref 6–20)
CHLORIDE: 105 mmol/L (ref 101–111)
CO2: 27 mmol/L (ref 22–32)
CREATININE: 0.64 mg/dL (ref 0.44–1.00)
Calcium: 9.5 mg/dL (ref 8.9–10.3)
GFR calc non Af Amer: >60 mL/min (ref >60)
Glucose, Bld: 120 mg/dL — ABNORMAL HIGH (ref 65–99)
Potassium: 4.3 mmol/L (ref 3.5–5.1)
SODIUM: 137 mmol/L (ref 135–145)

## 2015-05-24 LAB — CBC
HEMATOCRIT: 32.1 % — AB (ref 35.0–47.0)
HEMOGLOBIN: 10.6 g/dL — AB (ref 12.0–16.0)
MCH: 31.1 pg (ref 26.0–34.0)
MCHC: 33 g/dL (ref 32.0–36.0)
MCV: 94.3 fL (ref 80.0–100.0)
Platelets: 261 10*3/uL (ref 150–440)
RBC: 3.4 MIL/uL — AB (ref 3.80–5.20)
RDW: 14.7 % — ABNORMAL HIGH (ref 11.5–14.5)
WBC: 16.2 10*3/uL — AB (ref 3.6–11.0)

## 2015-05-24 MED ORDER — NYSTATIN 100000 UNIT/ML MT SUSP
5.0000 mL | Freq: Four times a day (QID) | OROMUCOSAL | Status: DC
  Administered 2015-05-24 – 2015-05-25 (×4): 500000 [IU] via OROMUCOSAL
  Filled 2015-05-24 (×4): qty 5

## 2015-05-24 MED ORDER — PREDNISONE 50 MG PO TABS
50.0000 mg | ORAL_TABLET | Freq: Every day | ORAL | Status: DC
  Administered 2015-05-25: 50 mg via ORAL
  Filled 2015-05-24: qty 1

## 2015-05-24 NOTE — H&P (Signed)
[facility] Consultation      Name: Hailey [name] MRN: [id number] DOB: [birth year]    ADMISSION DATE:  05/19/2015    CHIEF COMPLAINT:   Follow up Acute resp failure   HISTORY OF PRESENT ILLNESS  Doing well from a pulm standpoint, still with non productive cough, wean down to 3L [place] now.    pateint Review of Systems  Constitutional: Positive for malaise/fatigue. Negative for fever, chills and weight loss.  HENT: Positive for congestion.   Eyes: Negative for blurred vision and double vision.  Respiratory: Positive for cough. Negative for sputum production, shortness of breath and wheezing.   Cardiovascular: Negative for chest pain, palpitations, orthopnea and leg swelling.  Gastrointestinal: Negative for heartburn, nausea, vomiting and abdominal pain.  Musculoskeletal: Positive for joint pain. Negative for myalgias.  Skin: Negative for rash.  Neurological: Negative for headaches.  Psychiatric/Behavioral: The patient is not nervous/anxious.   All other systems reviewed and are negative.     VITAL SIGNS    Temp:  [97.7 F (36.5 C)-98.2 F (36.8 C)] 97.7 F (36.5 C) (04/09 0750) Pulse Rate:  [44-55] 55 (04/09 0750) Resp:  [16] 16 (04/09 0750) BP: (136-142)/(52-65) 139/65 mmHg (04/09 0750) SpO2:  [92 %-97 %] 92 % (04/09 0845) Weight:  [192 lb (87.091 kg)] 192 lb (87.091 kg) (04/09 0500) HEMODYNAMICS:   VENTILATOR SETTINGS:   INTAKE / OUTPUT:  Intake/Output Summary (Last 24 hours) at 05/24/15 1226 Last data filed at 05/24/15 1014  Gross per 24 hour  Intake    480 ml  Output   1950 ml  Net  -1470 ml       PHYSICAL EXAM   Physical Exam  Constitutional: She is oriented to person, place, and time. No distress.  HENT:  Head: Normocephalic and atraumatic.  Eyes: Pupils are equal, round, and reactive to light. No scleral icterus.  Neck: Normal range of motion. Neck supple.  Cardiovascular: Normal rate and regular rhythm.   No murmur  heard. Pulmonary/Chest: No respiratory distress. She has no wheezes. She has no rales.  Abdominal: Soft. She exhibits no distension. There is no tenderness.  Musculoskeletal: Normal range of motion. She exhibits edema.  Swan neck deformity of fingers  Neurological: She is alert and oriented to person, place, and time. She displays normal reflexes. Coordination normal.  Skin: Skin is warm. No rash noted. She is not diaphoretic.       LABS   LABS:  CBC  Recent Labs Lab 05/20/15 0343 05/23/15 0328 05/24/15 0335  WBC 11.0 14.3* 16.2*  HGB 10.2* 9.9* 10.6*  HCT 29.9* 29.2* 32.1*  PLT 167 228 261   Coag's No results for input(s): APTT, INR in the last 168 hours. BMET  Recent Labs Lab 05/19/15 1611 05/22/15 0904 05/23/15 0328 05/24/15 0335  NA 138  --  137 137  K 3.0*  --  3.4* 4.3  CL 103  --  104 105  CO2 25  --  26 27  BUN 16  --  20 21*  CREATININE 0.94 0.79 0.64 0.64  GLUCOSE 164*  --  182* 120*   Electrolytes  Recent Labs Lab 05/19/15 1611 05/23/15 0328 05/24/15 0335  CALCIUM 9.3 9.9 9.5   Sepsis Markers  Recent Labs Lab 05/19/15 2019 05/19/15 2316 05/20/15 0343  LATICACIDVEN 2.9* 1.9 1.8   ABG  Recent Labs Lab 05/19/15 1717 05/20/15 0453  PHART 7.50* 7.51*  PCO2ART 34 35  PO2ART 74* 71*   Liver Enzymes  Recent  Labs Lab 05/19/15 1611  AST 44*  ALT 28  ALKPHOS 72  BILITOT 0.8  ALBUMIN 4.4   Cardiac Enzymes  Recent Labs Lab 05/19/15 1611  TROPONINI 0.04*   Glucose No results for input(s): GLUCAP in the last 168 hours.   Recent Results (from the past 240 hour(s))  Blood Culture (routine x 2)     Status: None (Preliminary result)   Collection Time: 05/19/15  4:11 PM  Result Value Ref Range Status   Specimen Description BLOOD RIGHT HAND  Final   Special Requests   Final    BOTTLES DRAWN AEROBIC AND ANAEROBIC AERO 3CC ANA 2CC   Culture NO GROWTH 4 DAYS  Final   Report Status PENDING  Incomplete  Blood Culture (routine x  2)     Status: None (Preliminary result)   Collection Time: 05/19/15  4:11 PM  Result Value Ref Range Status   Specimen Description BLOOD LEFT FATTY CASTS  Final   Special Requests   Final    BOTTLES DRAWN AEROBIC AND ANAEROBIC  AERO 1CC ANA 5CC   Culture NO GROWTH 4 DAYS  Final   Report Status PENDING  Incomplete  Rapid Influenza A&B Antigens ([facility] only)     Status: None   Collection Time: 05/19/15  4:11 PM  Result Value Ref Range Status   Influenza A ([facility]) NEGATIVE NEGATIVE Final   Influenza B ([facility]) NEGATIVE NEGATIVE Final  Urine culture     Status: None   Collection Time: 05/19/15  4:55 PM  Result Value Ref Range Status   Specimen Description URINE, RANDOM  Final   Special Requests NONE  Final   Culture NO GROWTH 2 DAYS  Final   Report Status 05/21/2015 FINAL  Final  MRSA PCR Screening     Status: None   Collection Time: 05/19/15  6:34 PM  Result Value Ref Range Status   MRSA by PCR NEGATIVE NEGATIVE Final    Comment:        The GeneXpert MRSA Assay (FDA approved for NASAL specimens only), is one component of a comprehensive MRSA colonization surveillance program. It is not intended to diagnose MRSA infection nor to guide or monitor treatment for MRSA infections.      Current facility-administered medications:  .  0.9 %  sodium chloride infusion, 250 mL, Intravenous, PRN, Flora Lipps, MD .  0.9 %  sodium chloride infusion, 250 mL, Intravenous, PRN, Flora Lipps, MD .  acetaminophen (TYLENOL) tablet 650 mg, 650 mg, Oral, Q4H PRN, Flora Lipps, MD, 650 mg at 05/20/15 1405 .  albuterol (PROVENTIL) (2.5 MG/3ML) 0.083% nebulizer solution 2.5 mg, 2.5 mg, Nebulization, Q6H, Nicholes Mango, MD, 2.5 mg at 05/24/15 0845 .  amiodarone (PACERONE) tablet 200 mg, 200 mg, Oral, Daily, Flora Lipps, MD, 200 mg at 05/24/15 0850 .  amoxicillin-clavulanate (AUGMENTIN) 875-125 MG per tablet 1 tablet, 1 tablet, Oral, Q12H, Max Sane, MD, 1 tablet at 05/24/15 0848 .  Carbidopa-Levodopa ER  (SINEMET CR) 25-100 MG tablet controlled release 1 tablet, 1 tablet, Oral, BID, Max Sane, MD, 1 tablet at 05/24/15 0849 .  DULoxetine (CYMBALTA) DR capsule 90 mg, 90 mg, Oral, Daily, Flora Lipps, MD, 90 mg at 05/24/15 0849 .  enoxaparin (LOVENOX) injection 40 mg, 40 mg, Subcutaneous, Q24H, Flora Lipps, MD, 40 mg at 05/23/15 1651 .  feeding supplement (ENSURE ENLIVE) (ENSURE ENLIVE) liquid 237 mL, 237 mL, Oral, BID BM, Flora Lipps, MD, 237 mL at 05/24/15 0900 .  gabapentin (NEURONTIN) capsule 200 mg, 200 mg,  Oral, QPM, Flora Lipps, MD, 200 mg at 05/23/15 1651 .  gabapentin (NEURONTIN) capsule 300 mg, 300 mg, Oral, q morning - 10a, Flora Lipps, MD, 300 mg at 05/24/15 0849 .  gabapentin (NEURONTIN) capsule 400 mg, 400 mg, Oral, QHS, Flora Lipps, MD, 400 mg at 05/23/15 2130 .  HYDROcodone-homatropine (HYCODAN) 5-1.5 MG/5ML syrup 5 mL, 5 mL, Oral, Q4H PRN, Max Sane, MD, 5 mL at 05/23/15 1659 .  losartan (COZAAR) tablet 25 mg, 25 mg, Oral, Daily, Flora Lipps, MD, 25 mg at 05/24/15 0850 .  methylPREDNISolone sodium succinate (SOLU-MEDROL) 40 mg/mL injection 40 mg, 40 mg, Intravenous, Q24H, Vipul Shah, MD, 40 mg at 05/24/15 0500 .  mometasone-formoterol (DULERA) 100-5 MCG/ACT inhaler 2 puff, 2 puff, Inhalation, BID, Flora Lipps, MD, 2 puff at 05/24/15 0856 .  ondansetron (ZOFRAN) injection 4 mg, 4 mg, Intravenous, Q6H PRN, Flora Lipps, MD .  oxyCODONE (Oxy IR/ROXICODONE) immediate release tablet 5 mg, 5 mg, Oral, Q6H PRN, Flora Lipps, MD, 5 mg at 05/23/15 1658 .  pantoprazole (PROTONIX) EC tablet 40 mg, 40 mg, Oral, Daily, Max Sane, MD, 40 mg at 05/24/15 0848 .  polyethylene glycol (MIRALAX / GLYCOLAX) packet 17 g, 17 g, Oral, Daily, Vipul Shah, MD, 17 g at 05/24/15 0848 .  pravastatin (PRAVACHOL) tablet 40 mg, 40 mg, Oral, q1800, Flora Lipps, MD, 40 mg at 05/23/15 1651 .  senna (SENOKOT) tablet 17.2 mg, 2 tablet, Oral, BID, Vipul Shah, MD, 17.2 mg at 05/24/15 0850 .  sodium chloride flush (NS) 0.9 %  injection 3 mL, 3 mL, Intravenous, Q12H, Flora Lipps, MD, 3 mL at 05/24/15 0851 .  sodium chloride flush (NS) 0.9 % injection 3 mL, 3 mL, Intravenous, PRN, Flora Lipps, MD .  tiotropium (SPIRIVA) inhalation capsule 18 mcg, 18 mcg, Inhalation, Daily, Flora Lipps, MD, 18 mcg at 05/24/15 0856 .  zolpidem (AMBIEN) tablet 5 mg, 5 mg, Oral, QHS PRN, Nicholes Mango, MD, 5 mg at 05/23/15 2132  IMAGING    No results found.   INDWELLING DEVICES::  MICRO DATA: MRSA PCR NEGATIVE Urine  Blood Resp   ANTIMICROBIALS: Vanc/zosyn>>>4/4    ASSESSMENT/PLAN   82 yo white female admitted to ICU for acute hypoxic resp failure from acute Left Lung pneumonia with COPD exacerbation  PULMONARY -Respiratory Failure-improving -post infectious cough - acapella -oxygen support, wears 2L at home, now on 3L -IV abx and aggressive BD therapy -transition to PO steroids, start at 40mg  then wean over 14 days   CARDIOVASCULAR BP stable -IVF's if needed  RENAL Follow UO  GASTROINTESTINAL diet as tolerated  HEMATOLOGIC Follow CBC  INFECTIOUS Pneumonia -vanc and zosyn, blood cultures and sputum cultures Pending - stopping vanc since MRSA screen neg  After discussion with patient, SHE DOES NOT AGGRESSIVE MEASURES TO KEEP HER ALIVE, PATIENT IS NOW DNR/DNI  Patient has all care with [facility] and has requested to have Pulmonary Follow up with [facility].  Follow up with Dr. Raul Del moving forward.   Thank you for consulting [facility], we will signoff at this time.  Please feel free to contact us with any questions at [phone] (please enter 7-digits).   The Patient requires high complexity decision making for assessment and support, frequent evaluation and titration of therapies, application of advanced monitoring technologies and extensive interpretation of multiple databases  Pulmonary consult time - 60mins  Vilinda Boehringer, MD [facility] Pager  [phone] (please enter 7-digits) On Call Pager -  [phone] (please enter 7-digits) Clinic - [id number]

## 2015-05-24 NOTE — Progress Notes (Signed)
[place] [facility] [place] NAME: Hailey [name]    MR#:  [id number]  DATE OF BIRTH:  08-09-1932  SUBJECTIVE:  Breathing somewhat better, on 4 liters [place] still continues to cough. He slept well yesterday with Ambien. Feeling weak and tired REVIEW OF SYSTEMS:    Review of Systems  Constitutional: Positive for malaise/fatigue. Negative for fever and chills.  HENT: Negative for sore throat.   Eyes: Negative for blurred vision.  Respiratory: Positive for cough. Negative for hemoptysis, shortness of breath and wheezing.   Cardiovascular: Negative for chest pain, palpitations and leg swelling.  Gastrointestinal: Negative for nausea, vomiting, abdominal pain, diarrhea, constipation and blood in stool.  Genitourinary: Negative for dysuria.  Musculoskeletal: Positive for back pain and joint pain.  Neurological: Positive for weakness. Negative for dizziness, tremors and headaches.  Endo/Heme/Allergies: Does not bruise/bleed easily.    Tolerating Diet: Yes DRUG ALLERGIES:   Allergies  Allergen Reactions  . Aspirin Other (See Comments)    Reaction:  GI bleeding   . Codeine Nausea And Vomiting  . Ketorolac Tromethamine Other (See Comments)    Reaction:  Headache   . Nsaids Other (See Comments)    Reaction:  GI bleeding   . Phenazopyridine Hcl Other (See Comments)    Reaction:  Vision problems     VITALS:  Blood pressure 139/65, pulse 55, temperature 97.7 F (36.5 C), temperature source Oral, resp. rate 16, height 5\' 4"  (1.626 m), weight 87.091 kg (192 lb), SpO2 92 %.  PHYSICAL EXAMINATION:   Physical Exam  Constitutional: She is oriented to person, place, and time and well-developed, well-nourished, and in no distress. No distress.  HENT:  Head: Normocephalic.  Eyes: No scleral icterus.  Neck: Normal range of motion. Neck supple. No JVD present. No tracheal deviation present.  Cardiovascular: Normal rate, regular rhythm and normal heart  sounds.  Exam reveals no gallop and no friction rub.   No murmur heard. Pulmonary/Chest: Effort normal and breath sounds normal. No respiratory distress. She has no wheezes. She has no rales. She exhibits no tenderness.  Abdominal: Soft. Bowel sounds are normal. She exhibits no distension and no mass. There is no tenderness. There is no rebound and no guarding.  Musculoskeletal: Normal range of motion. She exhibits no edema.  Neurological: She is alert and oriented to person, place, and time. She displays tremor.  Skin: Skin is warm. No rash noted. No erythema.  Psychiatric: Affect and judgment normal.   LABORATORY PANEL:   CBC  Recent Labs Lab 05/24/15 0335  WBC 16.2*  HGB 10.6*  HCT 32.1*  PLT 261   ------------------------------------------------------------------------------------------------------------------  Chemistries   Recent Labs Lab 05/19/15 1611  05/24/15 0335  NA 138  < > 137  K 3.0*  < > 4.3  CL 103  < > 105  CO2 25  < > 27  GLUCOSE 164*  < > 120*  BUN 16  < > 21*  CREATININE 0.94  < > 0.64  CALCIUM 9.3  < > 9.5  AST 44*  --   --   ALT 28  --   --   ALKPHOS 72  --   --   BILITOT 0.8  --   --   < > = values in this interval not displayed. ------------------------------------------------------------------------------------------------------------------  Cardiac Enzymes  Recent Labs Lab 05/19/15 1611  TROPONINI 0.04*   ASSESSMENT AND PLAN:  82 year old female with early dementia, PAF and recent vertebral fracture who admitted  to the hospital with for acute hypoxic resp failure from Left Lung pneumonia with COPD exacerbation  * Acute hypoxic resp failure from Left Lung pneumonia with COPD exacerbation - Slowly improving - continue oxygen support, wean as tolerated (uses 2 liters at home mainly at night and as need) - Tapering IV steroid to by mouth prednisone, pulmonology is recommending 14 DAY  tapering, IV abx changed to by mouth Augmentin and  aggressive BD therapy -acapella  * Pneumonia - on Zosyn which was changed to Augmentin Urine culture with no growth  * COPD exacerbation - continue oxygen support, wean as tolerated (uses 2 liters at home mainly at night and as need) - continue IV steroids changed to by mouth prednisone 40 mg once daily -Nystatin swish and swallow for oral thrush  * Possible parkinson's dz - has tremors, recurrent falls - will start low dose sinemet to see if it helps - recommend outpt neuro f/up  * Hypokalemia - no labs for last 3 days, will recheck and replete if low -Provide potassium supplements when necessary   * Recent Vertebral compression fracture: on recent admission, Dr. Mack Guise - orthopedics has advised conservative management. On Oxycodone prn  * Essential hypertension: Continue losartan and metoprolol. Well controlled  * PAF: Continue amiodarone. She is NOT on aspirin due to history of GI bleed  * constipation: Improved with prn stool softners  * Neuropathy: on gabapentin and cymbalta  *Insomnia-Ambien as needed is helping with insomnia  *generalized weakness PT evaluation is recommending skilled nursing facility. Will follow up with care management.  Management plans discussed with the patient and she is in agreement.  CODE STATUS: DNR  TOTAL TIME TAKING CARE OF THIS PATIENT: 33 minutes.     POSSIBLE D/C 2-3 DAYS, DEPENDING ON CLINICAL CONDITION.   Nicholes Mango M.D on 05/24/2015 at 1:52 PM  Between 7am to 6pm - Pager - [phone] After 6pm go to [email or web address] - password [email or web address] [facility]  Office  [phone]  CC: Primary care physician; BABAOFF, Caryl Bis, MD  Note: This dictation was prepared with Dragon dictation along with smaller phrase technology. Any transcriptional errors that result from this process are unintentional.

## 2015-05-24 NOTE — NC FL2 (Signed)
[place] LEVEL OF CARE SCREENING TOOL     IDENTIFICATION  Patient Name: Hailey [name] Birthdate: 1932-08-16 Sex: female Admission Date (Current Location): 05/19/2015  [place] and [place] Number:  Engineering geologist and Address:  [facility], [address], [place], [place] [address]      Provider Number: [id number]  Attending Physician Name and Address:  Nicholes Mango, MD  Relative Name and Phone Number:   Lysle Rubens (Daughter) [phone] and Genesy Minch [facility])  [phone])    Current Level of Care: Hospital Recommended Level of Care: [place] Prior Approval Number:    Date Approved/Denied:   PASRR Number:     Discharge Plan: SNF    Current Diagnoses: Patient Active Problem List   Diagnosis Date Noted  . Respiratory failure ([place]) 05/19/2015  . Weakness 01/27/2015  . Abdomen enlarged 09/26/2013  . CTCL (cutaneous T-cell lymphoma) ([place]) 09/18/2013  . A-fib ([place]) 08/09/2013  . Anxiety 08/09/2013  . CCF (congestive cardiac failure) ([place]) 08/09/2013  . Chronic pain 08/09/2013  . PAF (paroxysmal atrial fibrillation) ([place])   . Chronic diastolic CHF (congestive heart failure) ([place]) 07/12/2013  . Benign essential HTN 07/01/2013  . Bilateral leg edema 06/06/2013  . Paroxysmal atrial fibrillation ([place]) 04/25/2013  . DOE (dyspnea on exertion) 10/11/2011  . COPD (chronic obstructive pulmonary disease) ([place]) 10/09/2011  . Cognitive decline 10/09/2011  . Behavior concern 08/23/2011  . Chest pain 10/26/2010  . Palpitations 10/26/2010  . PHLEBITIS AND THROMBOPHLEBITIS OF FEMORAL VEIN 10/13/2008  . DEEP VENOUS THROMBOPHLEBITIS, LEG, LEFT 10/09/2008  . GASTRIC ULCER, ACUTE 10/09/2008  . PERSONAL HX COLONIC POLYPS 09/02/2008  . FATIGUE 08/27/2008  . Hypertrophic obstructive cardiomyopathy(425.11) 11/06/2007  . HYPOTENSION, ORTHOSTATIC 11/06/2007  . ADENOMATOUS COLONIC POLYP 04/12/2007  . HIATAL HERNIA 04/12/2007  .  Diverticulosis of colon (without mention of hemorrhage) 04/12/2007  . Hyperlipidemia 02/03/2007  . ANEMIA, CHRONIC 02/03/2007  . MIGRAINE HEADACHE 02/03/2007  . Essential hypertension 02/03/2007  . CEREBRAL ANEURYSM 02/03/2007  . GASTROESOPHAGEAL REFLUX DISEASE 02/03/2007  . OSTEOARTHRITIS 02/03/2007  . FIBROMYALGIA 02/03/2007  . CHRONIC FATIGUE SYNDROME 02/03/2007  . MITRAL VALVE PROLAPSE, HX OF 02/03/2007  . PULMONARY EMBOLISM, HX OF 02/03/2007  . TOTAL KNEE REPLACEMENT, LEFT, HX OF 02/03/2007  . HYSTERECTOMY, HX OF 02/03/2007  . Other acquired absence of organ 02/03/2007  . INGUINAL HERNIORRHAPHY, RIGHT, HX OF 02/03/2007    Orientation RESPIRATION BLADDER Height & Weight     Self, Time, Situation, Place  O2 (3L/Min) Continent, Indwelling catheter Weight: 192 lb (87.091 kg) Height:  5\' 4"  (162.6 cm)  BEHAVIORAL SYMPTOMS/MOOD NEUROLOGICAL BOWEL NUTRITION STATUS      Continent Diet (Regular)  AMBULATORY STATUS COMMUNICATION OF NEEDS Skin   Limited Assist Verbally Normal                       Personal Care Assistance Level of Assistance  Bathing, Feeding, Dressing Bathing Assistance: Limited assistance Feeding assistance: Independent Dressing Assistance: Limited assistance     Functional Limitations Info  Sight, Hearing, Speech Sight Info: Adequate Hearing Info: Adequate Speech Info: Adequate    SPECIAL CARE FACTORS FREQUENCY  PT (By licensed PT), OT (By licensed OT)     PT Frequency:  (5X) OT Frequency:  (5X)            Contractures Contractures Info: Not present    Additional Factors Info  Allergies, Code Status Code Status Info:  (DNR) Allergies Info:  (Aspirin, Codeine,  Ketorolac Tromethamine, Nsaids, Phenazopyridine HCL)           Current Medications (05/24/2015):  This is the current hospital active medication list Current Facility-Administered Medications  Medication Dose Route Frequency Provider Last Rate Last Dose  . 0.9 %  sodium chloride  infusion  250 mL Intravenous PRN Flora Lipps, MD      . 0.9 %  sodium chloride infusion  250 mL Intravenous PRN Flora Lipps, MD      . acetaminophen (TYLENOL) tablet 650 mg  650 mg Oral Q4H PRN Flora Lipps, MD   650 mg at 05/20/15 1405  . albuterol (PROVENTIL) (2.5 MG/3ML) 0.083% nebulizer solution 2.5 mg  2.5 mg Nebulization Q6H Aruna Gouru, MD   2.5 mg at 05/24/15 1455  . amiodarone (PACERONE) tablet 200 mg  200 mg Oral Daily Flora Lipps, MD   200 mg at 05/24/15 0850  . amoxicillin-clavulanate (AUGMENTIN) 875-125 MG per tablet 1 tablet  1 tablet Oral Q12H Max Sane, MD   1 tablet at 05/24/15 0848  . Carbidopa-Levodopa ER (SINEMET CR) 25-100 MG tablet controlled release 1 tablet  1 tablet Oral BID Max Sane, MD   1 tablet at 05/24/15 0849  . DULoxetine (CYMBALTA) DR capsule 90 mg  90 mg Oral Daily Flora Lipps, MD   90 mg at 05/24/15 0849  . enoxaparin (LOVENOX) injection 40 mg  40 mg Subcutaneous Q24H Flora Lipps, MD   40 mg at 05/23/15 1651  . feeding supplement (ENSURE ENLIVE) (ENSURE ENLIVE) liquid 237 mL  237 mL Oral BID BM Flora Lipps, MD   237 mL at 05/24/15 0900  . gabapentin (NEURONTIN) capsule 200 mg  200 mg Oral QPM Flora Lipps, MD   200 mg at 05/23/15 1651  . gabapentin (NEURONTIN) capsule 300 mg  300 mg Oral q morning - 10a Flora Lipps, MD   300 mg at 05/24/15 0849  . gabapentin (NEURONTIN) capsule 400 mg  400 mg Oral QHS Flora Lipps, MD   400 mg at 05/23/15 2130  . HYDROcodone-homatropine (HYCODAN) 5-1.5 MG/5ML syrup 5 mL  5 mL Oral Q4H PRN Max Sane, MD   5 mL at 05/24/15 1353  . losartan (COZAAR) tablet 25 mg  25 mg Oral Daily Flora Lipps, MD   25 mg at 05/24/15 0850  . mometasone-formoterol (DULERA) 100-5 MCG/ACT inhaler 2 puff  2 puff Inhalation BID Flora Lipps, MD   2 puff at 05/24/15 0856  . nystatin (MYCOSTATIN) 100000 UNIT/ML suspension 500,000 Units  5 mL Mouth/Throat QID Nicholes Mango, MD   500,000 Units at 05/24/15 1425  . ondansetron (ZOFRAN) injection 4 mg  4 mg  Intravenous Q6H PRN Flora Lipps, MD      . oxyCODONE (Oxy IR/ROXICODONE) immediate release tablet 5 mg  5 mg Oral Q6H PRN Flora Lipps, MD   5 mg at 05/24/15 1353  . pantoprazole (PROTONIX) EC tablet 40 mg  40 mg Oral Daily Max Sane, MD   40 mg at 05/24/15 0848  . polyethylene glycol (MIRALAX / GLYCOLAX) packet 17 g  17 g Oral Daily Max Sane, MD   17 g at 05/24/15 0848  . pravastatin (PRAVACHOL) tablet 40 mg  40 mg Oral q1800 Flora Lipps, MD   40 mg at 05/23/15 1651  . [START ON 05/25/2015] predniSONE (DELTASONE) tablet 50 mg  50 mg Oral Q breakfast Nicholes Mango, MD      . senna (SENOKOT) tablet 17.2 mg  2 tablet Oral BID Max Sane, MD   17.2 mg  at 05/24/15 0850  . sodium chloride flush (NS) 0.9 % injection 3 mL  3 mL Intravenous Q12H Flora Lipps, MD   3 mL at 05/24/15 0851  . sodium chloride flush (NS) 0.9 % injection 3 mL  3 mL Intravenous PRN Flora Lipps, MD      . tiotropium (SPIRIVA) inhalation capsule 18 mcg  18 mcg Inhalation Daily Flora Lipps, MD   18 mcg at 05/24/15 0856  . zolpidem (AMBIEN) tablet 5 mg  5 mg Oral QHS PRN Nicholes Mango, MD   5 mg at 05/23/15 2132     Discharge Medications: Please see discharge summary for a list of discharge medications.  Relevant Imaging Results:  Relevant Lab Results:   Additional Information  (SSN: [id number]        Patient has a foley catheter)  Micah Flesher, [place]: [place] [employer] H[employer] [facility] [id number]

## 2015-05-25 ENCOUNTER — Encounter
Admission: RE | Admit: 2015-05-25 | Discharge: 2015-05-25 | Disposition: A | Discharge status: Home / Self Care | Payer: Commercial Managed Care - HMO | Source: Ambulatory Visit | Attending: Internal Medicine | Admitting: Internal Medicine

## 2015-05-25 DIAGNOSIS — G4700 Insomnia, unspecified: Secondary | ICD-10-CM | POA: Diagnosis not present

## 2015-05-25 DIAGNOSIS — J9601 Acute respiratory failure with hypoxia: Secondary | ICD-10-CM | POA: Diagnosis not present

## 2015-05-25 DIAGNOSIS — J441 Chronic obstructive pulmonary disease with (acute) exacerbation: Secondary | ICD-10-CM | POA: Diagnosis not present

## 2015-05-25 DIAGNOSIS — I509 Heart failure, unspecified: Secondary | ICD-10-CM | POA: Diagnosis not present

## 2015-05-25 DIAGNOSIS — E876 Hypokalemia: Secondary | ICD-10-CM | POA: Diagnosis not present

## 2015-05-25 DIAGNOSIS — M6281 Muscle weakness (generalized): Secondary | ICD-10-CM | POA: Diagnosis not present

## 2015-05-25 DIAGNOSIS — J189 Pneumonia, unspecified organism: Secondary | ICD-10-CM | POA: Diagnosis not present

## 2015-05-25 DIAGNOSIS — R251 Tremor, unspecified: Secondary | ICD-10-CM | POA: Diagnosis not present

## 2015-05-25 DIAGNOSIS — I110 Hypertensive heart disease with heart failure: Secondary | ICD-10-CM | POA: Diagnosis not present

## 2015-05-25 DIAGNOSIS — I480 Paroxysmal atrial fibrillation: Secondary | ICD-10-CM | POA: Diagnosis not present

## 2015-05-25 DIAGNOSIS — I422 Other hypertrophic cardiomyopathy: Secondary | ICD-10-CM | POA: Diagnosis not present

## 2015-05-25 DIAGNOSIS — M4800 Spinal stenosis, site unspecified: Secondary | ICD-10-CM | POA: Diagnosis not present

## 2015-05-25 DIAGNOSIS — I5032 Chronic diastolic (congestive) heart failure: Secondary | ICD-10-CM | POA: Diagnosis not present

## 2015-05-25 DIAGNOSIS — J9690 Respiratory failure, unspecified, unspecified whether with hypoxia or hypercapnia: Secondary | ICD-10-CM | POA: Diagnosis not present

## 2015-05-25 DIAGNOSIS — J449 Chronic obstructive pulmonary disease, unspecified: Secondary | ICD-10-CM | POA: Diagnosis not present

## 2015-05-25 DIAGNOSIS — J440 Chronic obstructive pulmonary disease with acute lower respiratory infection: Secondary | ICD-10-CM | POA: Diagnosis not present

## 2015-05-25 MED ORDER — ALBUTEROL SULFATE (2.5 MG/3ML) 0.083% IN NEBU: 2.5000 mg | INHALATION_SOLUTION | RESPIRATORY_TRACT | Status: DC | PRN

## 2015-05-25 MED ORDER — HYDROCODONE-HOMATROPINE 5-1.5 MG/5ML PO SYRP: 5.0000 mL | ORAL_SOLUTION | Freq: Four times a day (QID) | ORAL | Status: DC | PRN

## 2015-05-25 MED ORDER — PREDNISOLONE 5 MG PO TABS: 5.0000 mg | ORAL_TABLET | Freq: Every day | ORAL | Status: DC

## 2015-05-25 MED ORDER — ACETAMINOPHEN 325 MG PO TABS: 650.0000 mg | ORAL_TABLET | Freq: Four times a day (QID) | ORAL | Status: DC | PRN

## 2015-05-25 MED ORDER — CARBIDOPA-LEVODOPA ER 25-100 MG PO TBCR: 1.0000 | EXTENDED_RELEASE_TABLET | Freq: Two times a day (BID) | ORAL | Status: DC

## 2015-05-25 MED ORDER — OXYCODONE HCL 5 MG PO TABS: 5.0000 mg | ORAL_TABLET | Freq: Four times a day (QID) | ORAL | Status: DC | PRN

## 2015-05-25 MED ORDER — MOMETASONE FURO-FORMOTEROL FUM 100-5 MCG/ACT IN AERO: 2.0000 | INHALATION_SPRAY | Freq: Two times a day (BID) | RESPIRATORY_TRACT | Status: DC

## 2015-05-25 MED ORDER — NYSTATIN 100000 UNIT/ML MT SUSP: 5.0000 mL | Freq: Four times a day (QID) | OROMUCOSAL | Status: DC

## 2015-05-25 MED ORDER — AMOXICILLIN-POT CLAVULANATE 875-125 MG PO TABS: 1.0000 | ORAL_TABLET | Freq: Two times a day (BID) | ORAL | Status: DC

## 2015-05-25 MED ORDER — ZOLPIDEM TARTRATE 5 MG PO TABS: 5.0000 mg | ORAL_TABLET | Freq: Every evening | ORAL | Status: DC | PRN

## 2015-05-25 MED ORDER — SENNA 8.6 MG PO TABS: 2.0000 | ORAL_TABLET | Freq: Two times a day (BID) | ORAL | Status: DC

## 2015-05-25 NOTE — Clinical Social Work Note (Signed)
Clinical Social Work Assessment  Patient Details  Name: Hailey [name] MRN: [id number] Date of Birth: May 05, 1932  Date of referral:  05/25/15               Reason for consult:  Facility Placement                Permission sought to share information with:  Chartered certified accountant granted to share information::  Yes, Verbal Permission Granted  Name::      IT sales professional::   [place]   Relationship::     Contact Information:     Housing/Transportation Living arrangements for the past 2 months:  Charity fundraiser of Information:  Patient, Adult Children Patient Interpreter Needed:  None Criminal Activity/Legal Involvement Pertinent to Current Situation/Hospitalization:  No - Comment as needed Significant Relationships:  Adult Children Lives with:  Self Do you feel safe going back to the place where you live?  Yes Need for family participation in patient care:  Yes (Comment)  Care giving concerns:  Patient is a resident at [facility].    Social Worker assessment / plan:  Holiday representative (CSW) received verbal consult from RN Case Manager that PT is recommending SNF and is ready for D/C today. CSW met with patient to discuss D/C plan. Patient was alert and oriented and was laying in the bed. Per patient she lives at [place] and wants to go to [facility] for rehab. Per Maudie Mercury admissions coordinator at [facility] they can accept patient today. Per Maudie Mercury patient will go to room 210-B. RN will call report at [phone] and arrange EMS for transport. [facility] authorization has been received. Auth # [id number]. CSW sent D/C Summary, FL2 and D/C Packet to [employer] via [place]. Patient is aware of above. CSW contacted patient's daughter Berdine Addison and made her aware of above. Please reconsult if future social work needs arise. CSW signing off.   Employment status:  Retired Office manager PT Recommendations:  [place] / Referral to community resources:  [place]  Patient/Family's Response to care:  Patient is agreeable to go to [facility] today.   Patient/Family's Understanding of and Emotional Response to Diagnosis, Current Treatment, and Prognosis:  Patient and daughter were pleasant and thanked CSW for visit.   Emotional Assessment Appearance:  Appears stated age Attitude/Demeanor/Rapport:    Affect (typically observed):  Accepting, Adaptable, Pleasant Orientation:  Oriented to Self, Oriented to Place, Oriented to  Time, Oriented to Situation Alcohol / Substance use:  Not Applicable Psych involvement (Current and /or in the community):  No (Comment)  Discharge Needs  Concerns to be addressed:  Discharge Planning Concerns Readmission within the last 30 days:  No Current discharge risk:  Dependent with Mobility Barriers to Discharge:  Continued Medical Work up   Loralyn Freshwater, LCSW 05/25/2015, 1:23 PM

## 2015-05-25 NOTE — Clinical Social Work Placement (Signed)
   CLINICAL SOCIAL WORK PLACEMENT  NOTE  Date:  05/25/2015  Patient Details  Name: Hailey [name] MRN: [id number] Date of Birth: 1932/06/20  Clinical Social Work is seeking post-discharge placement for this patient at the [place] level of care (*CSW will initial, date and re-position this form in  chart as items are completed):  Yes   Patient/family provided with [place] Work Department's list of facilities offering this level of care within the geographic area requested by the patient (or if unable, by the patient's family).  Yes   Patient/family informed of their freedom to choose among providers that offer the needed level of care, that participate in Medicare, Medicaid or managed care program needed by the patient, have an available bed and are willing to accept the patient.  Yes   Patient/family informed of [place]'s ownership interest in [facility] and [facility], as well as of the fact that they are under no obligation to receive care at these facilities.  PASRR submitted to EDS on       PASRR number received on       Existing PASRR number confirmed on 05/25/15     FL2 transmitted to all facilities in geographic area requested by pt/family on 05/25/15     FL2 transmitted to all facilities within larger geographic area on       Patient informed that his/her managed care company has contracts with or will negotiate with certain facilities, including the following:        Yes   Patient/family informed of bed offers received.  Patient chooses bed at  [facility] )     Physician recommends and patient chooses bed at      Patient to be transferred to  [facility] ) on 05/25/15.  Patient to be transferred to facility by  [facility] )     Patient family notified on 05/25/15 of transfer.  Name of family member notified:   (Patient's daughter Hailey [name] is aware of D/C today. )     PHYSICIAN       Additional  Comment:    _______________________________________________ Loralyn Freshwater, LCSW 05/25/2015, 1:22 PM

## 2015-05-25 NOTE — Progress Notes (Addendum)
Patients B/P 128/45 HR 64. Doctor Gouru notified at [phone], verbal order given to hold losartan 25 mg, recheck B/P in an hour and call doctor back for further instructions.

## 2015-05-25 NOTE — Progress Notes (Signed)
Patient discharged to [facility] place via EMS. Report and intractions giver to [employer], Therapist, sports at [employer] and understanding verbalized. Written instructions and prescreptions sent with patient. Patient discharged in stable condition.

## 2015-05-25 NOTE — Progress Notes (Signed)
Doctor Gouru notified about patient's blood pressure 126/46 at 1120. She gave order to hold losartan.

## 2015-05-25 NOTE — Discharge Summary (Signed)
[place] [facility] [place] NAME: Hailey [name]    MR#:  [id number]  DATE OF BIRTH:  05-29-1932  DATE OF ADMISSION:  05/19/2015 ADMITTING PHYSICIAN: No admitting provider for patient encounter.  DATE OF DISCHARGE: 05/25/15 PRIMARY CARE PHYSICIAN: BABAOFF, MARC E, MD    ADMISSION DIAGNOSIS:  HCAP (healthcare-associated pneumonia) [J18.9]  DISCHARGE DIAGNOSIS:  Active Problems:   Respiratory failure (HCC)  pneumonia COPD exacerbation-   SECONDARY DIAGNOSIS:   Past Medical History  Diagnosis Date  . DVT (deep vein thrombosis) in pregnancy 09/2008    a. LLE, recurrent hx, has IVC filter. Not on coumadin now with history of GI bleeding  . Hyperlipidemia   . GERD (gastroesophageal reflux disease)   . Hypertrophic cardiomyopathy ([place])     a. Echo 4/09 with EF 70-75%, asymmetricy basal septal hypertrophy, mild MR without systolic anterior motion of the mitral valve, LVOT gradient to 130 mmHg with Valsalva b. Echo 7/13: EF 60-65%, mild focal basal septal hypertrophy, no significant LVOT gradient, no MV SAM c. Echo 2/15: EF 55-60%, HOCM, resting LVOT gradient 29 mmHg, Valsalva LVOT gradient > 140 mmHg, mild LVH, mild TR, elevated PASP  . Anemia     Chronic  . Osteoarthritis     Hx of left TKR  . Fibromyalgia   . Cerebral aneurysm     Hx of  . Migraine headache     Hx of  . Hypertension   . COPD (chronic obstructive pulmonary disease) ([place])   . PUD (peptic ulcer disease)     With GI bleeding  . History of colonoscopy   . PAF (paroxysmal atrial fibrillation) ([place])     a. Full-dose ASA alone 2/2 h/o GIB.  [place] OSA (obstructive sleep apnea)     a. Intolerant to CPAP, wears 2L via n/c  . Fibrocystic disease of breast   . Anxiety   . PAT (paroxysmal atrial tachycardia) ([place])   . Pneumonia   . 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 Asthma   . CHF (congestive heart failure) ([place])   . Arthritis   . Tremor    . Heart murmur   . Diverticulitis   . Pulmonary embolism ([place])   . Atrophic vaginitis   . Incomplete bladder emptying   . Gross hematuria   . Obesity   . Gout     HOSPITAL COURSE:   82 year old female with early dementia, PAF and recent vertebral fracture who admitted to the hospital with for acute hypoxic resp failure from Left Lung pneumonia with COPD exacerbation  * Acute hypoxic resp failure from Left Lung pneumonia with COPD exacerbation - Clinically better - continue oxygen support, weaned off as tolerated (uses 2 liters at home mainly at night and as need) now she needs 3 L of oxygen - Tapering IV steroid to by mouth prednisone, pulmonology has recommended 14 DAY tapering, IV abx changed to by mouth Augmentin and aggressive BD therapy -acapella  * Pneumonia - on Zosyn which was changed to Augmentin Urine culture with no growth  * COPD exacerbation - continue oxygen support, wean as tolerated (uses 2 liters at home mainly at night and as need) - continue IV steroids changed to by mouth prednisone 40 mg once daily, will taper over 14 days -Nystatin swish and swallow for oral thrush  * Possible parkinson's dz - has tremors, recurrent falls - will start low dose sinemet to see if it helps - recommend  outpt neuro f/up  * Hypokalemia -Provide potassium supplements when necessary   * Recent Vertebral compression fracture: on recent admission, Dr. Mack Guise - orthopedics has advised conservative management. On Oxycodone prn Rheumatoid factor is high normal. PCP to consider referring the patient to outpatient rheumatology at his discretion  * Essential hypertension: Continue losartan and metoprolol. Well controlled  * PAF: Continue amiodarone. She is NOT on aspirin due to history of GI bleed  * constipation: Improved with prn stool softners  * Neuropathy: on gabapentin and cymbalta  *Insomnia-Ambien as needed is helping with insomnia  *generalized weakness PT  evaluation is recommending skilled nursing facility. Will discharge to skilled nursing facility Management plans discussed with the patient and she is in agreement.   DISCHARGE CONDITIONS:  FAIR  CONSULTS OBTAINED:      PROCEDURES none  DRUG ALLERGIES:   Allergies  Allergen Reactions  . Aspirin Other (See Comments)    Reaction:  GI bleeding   . Codeine Nausea And Vomiting  . Ketorolac Tromethamine Other (See Comments)    Reaction:  Headache   . Nsaids Other (See Comments)    Reaction:  GI bleeding   . Phenazopyridine Hcl Other (See Comments)    Reaction:  Vision problems     DISCHARGE MEDICATIONS:   Current Discharge Medication List    START taking these medications   Details  albuterol (PROVENTIL) (2.5 MG/3ML) 0.083% nebulizer solution Take 3 mLs (2.5 mg total) by nebulization every 4 (four) hours as needed for wheezing or shortness of breath. Qty: 75 mL, Refills: 12    amoxicillin-clavulanate (AUGMENTIN) 875-125 MG tablet Take 1 tablet by mouth 2 (two) times daily. Qty: 14 tablet, Refills: 0    Carbidopa-Levodopa ER (SINEMET CR) 25-100 MG tablet controlled release Take 1 tablet by mouth 2 (two) times daily. Qty: 60 tablet, Refills: 0    HYDROcodone-homatropine (HYCODAN) 5-1.5 MG/5ML syrup Take 5 mLs by mouth every 6 (six) hours as needed for cough. Qty: 120 mL, Refills: 0    mometasone-formoterol (DULERA) 100-5 MCG/ACT AERO Inhale 2 puffs into the lungs 2 (two) times daily. Qty: 1 Inhaler, Refills: 0    nystatin (MYCOSTATIN) 100000 UNIT/ML suspension Use as directed 5 mLs (500,000 Units total) in the mouth or throat 4 (four) times daily. Qty: 120 mL, Refills: 0    oxyCODONE (OXY IR/ROXICODONE) 5 MG immediate release tablet Take 1 tablet (5 mg total) by mouth every 6 (six) hours as needed for moderate pain or severe pain. Qty: 30 tablet, Refills: 0    prednisoLONE 5 MG TABS tablet Take 1 tablet (5 mg total) by mouth daily. Label  & dispense according to the  schedule below: 10 Pills PO for 3 days, followed by 8   Pills PO for 3 days, followed by 6   Pills PO for 3 days, followed by 4   Pills PO for 3 days, followed by 2   Pills PO for 3 days, followed by 1   Pills PO for 3 days then STOP. Qty: 93 tablet, Refills: 0    senna (SENOKOT) 8.6 MG TABS tablet Take 2 tablets (17.2 mg total) by mouth 2 (two) times daily. Qty: 120 each, Refills: 0    zolpidem (AMBIEN) 5 MG tablet Take 1 tablet (5 mg total) by mouth at bedtime as needed for sleep. Qty: 30 tablet, Refills: 0      CONTINUE these medications which have CHANGED   Details  acetaminophen (TYLENOL) 325 MG tablet Take 2 tablets (650 mg total)  by mouth every 6 (six) hours as needed for mild pain or moderate pain (temp > 101.5).      CONTINUE these medications which have NOT CHANGED   Details  amiodarone (PACERONE) 200 MG tablet Take 200 mg by mouth daily. Pt is able to take an additional tablet if needed for breakthrough arrhythmias.    dimenhyDRINATE (DRAMAMINE) 50 MG tablet Take 25-50 mg by mouth every 6 (six) hours as needed for dizziness.     docusate sodium (COLACE) 100 MG capsule Take 100 mg by mouth 2 (two) times daily as needed for mild constipation.    !! DULoxetine (CYMBALTA) 30 MG capsule Take 30 mg by mouth daily. Pt takes with a 60mg  capsule.    !! DULoxetine (CYMBALTA) 60 MG capsule Take 60 mg by mouth daily. Pt takes with a 30mg  capsule.    feeding supplement, ENSURE ENLIVE, (ENSURE ENLIVE) LIQD Take 237 mLs by mouth 2 (two) times daily between meals. Qty: 237 mL, Refills: 12    fluticasone (FLONASE) 50 MCG/ACT nasal spray Place 2 sprays into both nostrils daily as needed for rhinitis.    gabapentin (NEURONTIN) 100 MG capsule Take 200-400 mg by mouth 3 (three) times daily. Pt takes three capsules in the morning, two capsules in the evening, and four capsules at bedtime.    losartan (COZAAR) 50 MG tablet Take 0.5 tablets (25 mg total) by mouth daily. Qty: 30 tablet,  Refills: 3    lovastatin (MEVACOR) 40 MG tablet Take 40 mg by mouth at bedtime.    Melatonin 5 MG CAPS Take 5 mg by mouth at bedtime as needed (for sleep).     metoprolol succinate (TOPROL-XL) 25 MG 24 hr tablet Take 25 mg by mouth at bedtime.     montelukast (SINGULAIR) 10 MG tablet Take 10 mg by mouth at bedtime.    nitrofurantoin, macrocrystal-monohydrate, (MACROBID) 100 MG capsule Take 1 capsule (100 mg total) by mouth at bedtime. Qty: 90 capsule, Refills: 0   Associated Diagnoses: Recurrent UTI    pantoprazole (PROTONIX) 40 MG tablet Take 40 mg by mouth daily.    polyethylene glycol (MIRALAX / GLYCOLAX) packet Take 17 g by mouth daily as needed for moderate constipation.     potassium chloride (K-DUR,KLOR-CON) 10 MEQ tablet Take 20 mEq by mouth 2 (two) times daily.    tiotropium (SPIRIVA) 18 MCG inhalation capsule Place 18 mcg into inhaler and inhale daily.    torsemide (DEMADEX) 20 MG tablet Take 20-40 mg by mouth 2 (two) times daily as needed (for edema). Pt takes two tablets in the morning and one tablet in the evening if needed for edema.    traMADol (ULTRAM) 50 MG tablet Take 50 mg by mouth 2 (two) times daily as needed for moderate pain.     triamcinolone ointment (KENALOG) 0.1 % Apply 1 application topically 2 (two) times daily as needed (for rash).     alendronate (FOSAMAX) 70 MG tablet Take 70 mg by mouth once a week. Take with a full glass of water on an empty stomach.     !! - Potential duplicate medications found. Please discuss with provider.       DISCHARGE INSTRUCTIONS:   Activity per PT recommendations Continue oxygen via nasal cannula Follow-up with primary care physician at facility in 3-4 days Follow-up with pulmonology Dr. Raul Del in 1 week Follow up with neurology dr.Shah 2 weeks   DIET:  Low-salt  DISCHARGE CONDITION:  Fair  ACTIVITY:  Activity as tolerated per PT  recommendations  OXYGEN:  Home Oxygen: Yes.     Oxygen Delivery: 3  liters/min via Patient connected to nasal cannula oxygen  DISCHARGE LOCATION:  nursing home   If you experience worsening of your admission symptoms, develop shortness of breath, life threatening emergency, suicidal or homicidal thoughts you must seek medical attention immediately by calling 911 or calling your MD immediately  if symptoms less severe.  You Must read complete instructions/literature along with all the possible adverse reactions/side effects for all the Medicines you take and that have been prescribed to you. Take any new Medicines after you have completely understood and accpet all the possible adverse reactions/side effects.   Please note  You were cared for by a hospitalist during your hospital stay. If you have any questions about your discharge medications or the care you received while you were in the hospital after you are discharged, you can call the unit and asked to speak with the hospitalist on call if the hospitalist that took care of you is not available. Once you are discharged, your primary care physician will handle any further medical issues. Please note that NO REFILLS for any discharge medications will be authorized once you are discharged, as it is imperative that you return to your primary care physician (or establish a relationship with a primary care physician if you do not have one) for your aftercare needs so that they can reassess your need for medications and monitor your lab values.     Today  Chief Complaint  Patient presents with  . Shortness of Breath  . Altered Mental Status    Patient is feeling fine. Shortness of breath is better. Cough is improving. On 3 L of oxygen via nasal cannula. Feels comfortable to go to nursing home for rehabilitation   ROS:  CONSTITUTIONAL: Denies fevers, chills. Denies any fatigue, weakness.  EYES: Denies blurry vision, double vision, eye pain. EARS, NOSE, THROAT: Denies tinnitus, ear pain, hearing  loss. RESPIRATORY: Improving cough, denies wheeze, shortness of breath.  CARDIOVASCULAR: Denies chest pain, palpitations, edema.  GASTROINTESTINAL: Denies nausea, vomiting, diarrhea, abdominal pain. Denies bright red blood per rectum. GENITOURINARY: Denies dysuria, hematuria. ENDOCRINE: Denies nocturia or thyroid problems. HEMATOLOGIC AND LYMPHATIC: Denies easy bruising or bleeding. SKIN: Denies rash or lesion. MUSCULOSKELETAL: Denies pain in neck, shoulder, knees, hips or arthritic symptoms. Chronic low back pain NEUROLOGIC: Denies paralysis, paresthesias.  PSYCHIATRIC: Denies anxiety or depressive symptoms.   VITAL SIGNS:  Blood pressure 126/46, pulse 60, temperature 98 F (36.7 C), temperature source Oral, resp. rate 18, height 5\' 4"  (1.626 m), weight 87.181 kg (192 lb 3.2 oz), SpO2 95 %.  I/O:    Intake/Output Summary (Last 24 hours) at 05/25/15 1146 Last data filed at 05/25/15 0927  Gross per 24 hour  Intake    720 ml  Output   1350 ml  Net   -630 ml    PHYSICAL EXAMINATION:  GENERAL:  82 y.o.-year-old patient lying in the bed with no acute distress.  EYES: Pupils equal, round, reactive to light and accommodation. No scleral icterus. Extraocular muscles intact.  HEENT: Head atraumatic, normocephalic. Oropharynx and nasopharynx clear.  NECK:  Supple, no jugular venous distention. No thyroid enlargement, no tenderness.  LUNGS: Moderate breath sounds bilaterally, no wheezing, rales,rhonchi or crepitation. No use of accessory muscles of respiration.  CARDIOVASCULAR: S1, S2 normal. No murmurs, rubs, or gallops.  ABDOMEN: Soft, non-tender, non-distended. Bowel sounds present. No organomegaly or mass.  EXTREMITIES: No pedal edema, cyanosis, or clubbing.  NEUROLOGIC: Cranial nerves II through XII are intact. Muscle strength 5/5 in all extremities. Sensation intact. Gait not checked.  PSYCHIATRIC: The patient is alert and oriented x 3.  SKIN: No obvious rash, lesion, or ulcer.    DATA REVIEW:   CBC  Recent Labs Lab 05/24/15 0335  WBC 16.2*  HGB 10.6*  HCT 32.1*  PLT 261    Chemistries   Recent Labs Lab 05/19/15 1611  05/24/15 0335  NA 138  < > 137  K 3.0*  < > 4.3  CL 103  < > 105  CO2 25  < > 27  GLUCOSE 164*  < > 120*  BUN 16  < > 21*  CREATININE 0.94  < > 0.64  CALCIUM 9.3  < > 9.5  AST 44*  --   --   ALT 28  --   --   ALKPHOS 72  --   --   BILITOT 0.8  --   --   < > = values in this interval not displayed.  Cardiac Enzymes  Recent Labs Lab 05/19/15 1611  TROPONINI 0.04*    Microbiology Results  Results for orders placed or performed during the hospital encounter of 05/19/15  Blood Culture (routine x 2)     Status: None   Collection Time: 05/19/15  4:11 PM  Result Value Ref Range Status   Specimen Description BLOOD RIGHT HAND  Final   Special Requests   Final    BOTTLES DRAWN AEROBIC AND ANAEROBIC AERO 3CC ANA [place]   Culture NO GROWTH 5 DAYS  Final   Report Status 05/24/2015 FINAL  Final  Blood Culture (routine x 2)     Status: None   Collection Time: 05/19/15  4:11 PM  Result Value Ref Range Status   Specimen Description BLOOD LEFT FATTY CASTS  Final   Special Requests   Final    BOTTLES DRAWN AEROBIC AND ANAEROBIC  AERO 1CC ANA 5CC   Culture NO GROWTH 5 DAYS  Final   Report Status 05/24/2015 FINAL  Final  Rapid Influenza A&B Antigens ([place] only)     Status: None   Collection Time: 05/19/15  4:11 PM  Result Value Ref Range Status   Influenza A ([place]) NEGATIVE NEGATIVE Final   Influenza B ([facility]) NEGATIVE NEGATIVE Final  Urine culture     Status: None   Collection Time: 05/19/15  4:55 PM  Result Value Ref Range Status   Specimen Description URINE, RANDOM  Final   Special Requests NONE  Final   Culture NO GROWTH 2 DAYS  Final   Report Status 05/21/2015 FINAL  Final  MRSA PCR Screening     Status: None   Collection Time: 05/19/15  6:34 PM  Result Value Ref Range Status   MRSA by PCR NEGATIVE NEGATIVE Final     Comment:        The GeneXpert MRSA Assay (FDA approved for NASAL specimens only), is one component of a comprehensive MRSA colonization surveillance program. It is not intended to diagnose MRSA infection nor to guide or monitor treatment for MRSA infections.     RADIOLOGY:  No results found.  EKG:   Orders placed or performed during the hospital encounter of 05/19/15  . ED EKG  . ED EKG  . EKG 12-Lead  . EKG 12-Lead      Management plans discussed with the patient, family and they are in agreement.  CODE STATUS:     Code Status Orders  Start     Ordered   05/21/15 1034  Do not attempt resuscitation (DNR)   Continuous    Question Answer Comment  In the event of cardiac or respiratory ARREST Do not call a "code blue"   In the event of cardiac or respiratory ARREST Do not perform Intubation, CPR, defibrillation or ACLS   In the event of cardiac or respiratory ARREST Use medication by any route, position, wound care, and other measures to relive pain and suffering. May use oxygen, suction and manual treatment of airway obstruction as needed for comfort.      05/21/15 1034    Code Status History    Date Active Date Inactive Code Status Order ID Comments User Context   05/19/2015  4:56 PM 05/21/2015 10:34 AM Full Code [id number]  Flora Lipps, MD ED   01/27/2015  5:23 PM 01/28/2015  4:52 PM Full Code [id number]  [name], MD ED    Advance Directive Documentation        Most Recent Value   Type of Advance Directive  Healthcare Power of Attorney   Pre-existing out of facility DNR order (yellow form or pink MOST form)     "MOST" Form in Place?        TOTAL TIME TAKING CARE OF THIS PATIENT:45 minutes.    @[facility] @  on 05/25/2015 at 11:46 AM  Between 7am to 6pm - Pager - [phone]  After 6pm go to [email or web address] - password [email or web address] [place] [facility]  Office  [phone]  CC: Primary care physician; BABAOFF, Caryl Bis, MD

## 2015-05-25 NOTE — Consult Note (Signed)
   [facility] CM Inpatient Consult   05/25/2015  Hailey [name] May 03, 1932 [id number]  Patient screened for potential [place] Management services. Patient is eligible for [place]. Epic reveals patient's discharge plan is  SNF and there were no identifiable [facility] care management needs at this time. [facility] Care Management services not appropriate at this time. If patient's post hospital needs change please place a [facility] Care Management consult. For questions please contact:   Darcey Demma RN, [facility] [facility] Liaison  [phone]) Business Mobile [phone]) Toll free office

## 2015-05-25 NOTE — Care Management Important Message (Signed)
Important Message  Patient Details  Name: Hailey [name] MRN: [id number] Date of Birth: [birth year]   Medicare Important Message Given:  Yes    [name] 05/25/2015, 10:44 AM

## 2015-05-25 NOTE — Discharge Instructions (Signed)
Activity per PT recommendations Continue oxygen via nasal cannula Follow-up with primary care physician at facility in 3-4 days Follow-up with pulmonology Dr. Raul Del in 1 week Follow up with neurology dr.Shah 2 weeks

## 2015-05-25 NOTE — NC FL2 (Signed)
[place] LEVEL OF CARE SCREENING TOOL     IDENTIFICATION  Patient Name: Hailey [name] Birthdate: [birth year] Sex: female Admission Date (Current Location): 05/19/2015  [place] and [place] Number:  Engineering geologist and Address:  [facility], [address] [place], [place] [address]      Provider Number: [id number]  Attending Physician Name and Address:  Nicholes Mango, MD  Relative Name and Phone Number:   Lysle Rubens (Daughter) [phone] and Prudencia Petrella [name])  [phone])    Current Level of Care: Hospital Recommended Level of Care: [place] Prior Approval Number:    Date Approved/Denied:   PASRR Number:   [id number]    Discharge Plan: SNF    Current Diagnoses: Patient Active Problem List   Diagnosis Date Noted  . Respiratory failure ([employer]) 05/19/2015  . Weakness 01/27/2015  . Abdomen enlarged 09/26/2013  . CTCL (cutaneous T-cell lymphoma) ([place]) 09/18/2013  . A-fib ([place]) 08/09/2013  . Anxiety 08/09/2013  . CCF (congestive cardiac failure) ([place]) 08/09/2013  . Chronic pain 08/09/2013  . PAF (paroxysmal atrial fibrillation) ([place])   . Chronic diastolic CHF (congestive heart failure) ([place]) 07/12/2013  . Benign essential HTN 07/01/2013  . Bilateral leg edema 06/06/2013  . Paroxysmal atrial fibrillation ([place]) 04/25/2013  . DOE (dyspnea on exertion) 10/11/2011  . COPD (chronic obstructive pulmonary disease) ([place]) 10/09/2011  . Cognitive decline 10/09/2011  . Behavior concern 08/23/2011  . Chest pain 10/26/2010  . Palpitations 10/26/2010  . PHLEBITIS AND THROMBOPHLEBITIS OF FEMORAL VEIN 10/13/2008  . DEEP VENOUS THROMBOPHLEBITIS, LEG, LEFT 10/09/2008  . GASTRIC ULCER, ACUTE 10/09/2008  . PERSONAL HX COLONIC POLYPS 09/02/2008  . FATIGUE 08/27/2008  . Hypertrophic obstructive cardiomyopathy(425.11) 11/06/2007  . HYPOTENSION, ORTHOSTATIC 11/06/2007  . ADENOMATOUS COLONIC POLYP 04/12/2007  . HIATAL HERNIA  04/12/2007  . Diverticulosis of colon (without mention of hemorrhage) 04/12/2007  . Hyperlipidemia 02/03/2007  . ANEMIA, CHRONIC 02/03/2007  . MIGRAINE HEADACHE 02/03/2007  . Essential hypertension 02/03/2007  . CEREBRAL ANEURYSM 02/03/2007  . GASTROESOPHAGEAL REFLUX DISEASE 02/03/2007  . OSTEOARTHRITIS 02/03/2007  . FIBROMYALGIA 02/03/2007  . CHRONIC FATIGUE SYNDROME 02/03/2007  . MITRAL VALVE PROLAPSE, HX OF 02/03/2007  . PULMONARY EMBOLISM, HX OF 02/03/2007  . TOTAL KNEE REPLACEMENT, LEFT, HX OF 02/03/2007  . HYSTERECTOMY, HX OF 02/03/2007  . Other acquired absence of organ 02/03/2007  . INGUINAL HERNIORRHAPHY, RIGHT, HX OF 02/03/2007    Orientation RESPIRATION BLADDER Height & Weight     Self, Time, Situation, Place  O2 (3L/Min) Continent, Indwelling catheter Weight: 192 lb 3.2 oz (87.181 kg) Height:  5\' 4"  (162.6 cm)  BEHAVIORAL SYMPTOMS/MOOD NEUROLOGICAL BOWEL NUTRITION STATUS      Continent Diet (Regular)  AMBULATORY STATUS COMMUNICATION OF NEEDS Skin   Limited Assist Verbally Normal                       Personal Care Assistance Level of Assistance  Bathing, Feeding, Dressing Bathing Assistance: Limited assistance Feeding assistance: Independent Dressing Assistance: Limited assistance     Functional Limitations Info  Sight, Hearing, Speech Sight Info: Adequate Hearing Info: Adequate Speech Info: Adequate    SPECIAL CARE FACTORS FREQUENCY  PT (By licensed PT), OT (By licensed OT)     PT Frequency:  (5X) OT Frequency:  (5X)            Contractures Contractures Info: Not present    Additional Factors Info  Allergies, Code Status Code Status Info:  (DNR) Allergies  Info:  (Aspirin, Codeine, Ketorolac Tromethamine, Nsaids, Phenazopyridine HCL)           Current Medications (05/25/2015):  This is the current hospital active medication list Current Facility-Administered Medications  Medication Dose Route Frequency Provider Last Rate Last Dose  .  0.9 %  sodium chloride infusion  250 mL Intravenous PRN Flora Lipps, MD      . 0.9 %  sodium chloride infusion  250 mL Intravenous PRN Flora Lipps, MD      . acetaminophen (TYLENOL) tablet 650 mg  650 mg Oral Q4H PRN Flora Lipps, MD   650 mg at 05/20/15 1405  . albuterol (PROVENTIL) (2.5 MG/3ML) 0.083% nebulizer solution 2.5 mg  2.5 mg Nebulization Q6H Nicholes Mango, MD   2.5 mg at 05/24/15 1932  . amiodarone (PACERONE) tablet 200 mg  200 mg Oral Daily Flora Lipps, MD   200 mg at 05/24/15 0850  . amoxicillin-clavulanate (AUGMENTIN) 875-125 MG per tablet 1 tablet  1 tablet Oral Q12H Max Sane, MD   1 tablet at 05/24/15 2236  . Carbidopa-Levodopa ER (SINEMET CR) 25-100 MG tablet controlled release 1 tablet  1 tablet Oral BID Max Sane, MD   1 tablet at 05/24/15 2236  . DULoxetine (CYMBALTA) DR capsule 90 mg  90 mg Oral Daily Flora Lipps, MD   90 mg at 05/24/15 0849  . enoxaparin (LOVENOX) injection 40 mg  40 mg Subcutaneous Q24H Flora Lipps, MD   40 mg at 05/24/15 1657  . feeding supplement (ENSURE ENLIVE) (ENSURE ENLIVE) liquid 237 mL  237 mL Oral BID BM Flora Lipps, MD   237 mL at 05/24/15 1657  . gabapentin (NEURONTIN) capsule 200 mg  200 mg Oral QPM Flora Lipps, MD   200 mg at 05/24/15 1656  . gabapentin (NEURONTIN) capsule 300 mg  300 mg Oral q morning - 10a Flora Lipps, MD   300 mg at 05/24/15 0849  . gabapentin (NEURONTIN) capsule 400 mg  400 mg Oral QHS Flora Lipps, MD   400 mg at 05/24/15 2236  . HYDROcodone-homatropine (HYCODAN) 5-1.5 MG/5ML syrup 5 mL  5 mL Oral Q4H PRN Max Sane, MD   5 mL at 05/24/15 1353  . losartan (COZAAR) tablet 25 mg  25 mg Oral Daily Flora Lipps, MD   25 mg at 05/24/15 0850  . mometasone-formoterol (DULERA) 100-5 MCG/ACT inhaler 2 puff  2 puff Inhalation BID Flora Lipps, MD   2 puff at 05/24/15 2027  . nystatin (MYCOSTATIN) 100000 UNIT/ML suspension 500,000 Units  5 mL Mouth/Throat QID Nicholes Mango, MD   500,000 Units at 05/24/15 2236  . ondansetron (ZOFRAN)  injection 4 mg  4 mg Intravenous Q6H PRN Flora Lipps, MD      . oxyCODONE (Oxy IR/ROXICODONE) immediate release tablet 5 mg  5 mg Oral Q6H PRN Flora Lipps, MD   5 mg at 05/24/15 2027  . pantoprazole (PROTONIX) EC tablet 40 mg  40 mg Oral Daily Max Sane, MD   40 mg at 05/24/15 0848  . polyethylene glycol (MIRALAX / GLYCOLAX) packet 17 g  17 g Oral Daily Max Sane, MD   17 g at 05/24/15 0848  . pravastatin (PRAVACHOL) tablet 40 mg  40 mg Oral q1800 Flora Lipps, MD   40 mg at 05/24/15 1656  . predniSONE (DELTASONE) tablet 50 mg  50 mg Oral Q breakfast Nicholes Mango, MD      . senna (SENOKOT) tablet 17.2 mg  2 tablet Oral BID Max Sane, MD   17.2  mg at 05/24/15 2236  . sodium chloride flush (NS) 0.9 % injection 3 mL  3 mL Intravenous Q12H Flora Lipps, MD   3 mL at 05/24/15 2237  . sodium chloride flush (NS) 0.9 % injection 3 mL  3 mL Intravenous PRN Flora Lipps, MD      . tiotropium (SPIRIVA) inhalation capsule 18 mcg  18 mcg Inhalation Daily Flora Lipps, MD   18 mcg at 05/24/15 0856  . zolpidem (AMBIEN) tablet 5 mg  5 mg Oral QHS PRN Nicholes Mango, MD   5 mg at 05/23/15 2132     Discharge Medications: Please see discharge summary for a list of discharge medications.  Relevant Imaging Results:  Relevant Lab Results:   Additional Information  (SSN: [id number]        Patient has a foley catheter)  Loralyn Freshwater, LCSW

## 2015-05-26 DIAGNOSIS — I509 Heart failure, unspecified: Secondary | ICD-10-CM | POA: Diagnosis not present

## 2015-05-26 DIAGNOSIS — M4800 Spinal stenosis, site unspecified: Secondary | ICD-10-CM | POA: Diagnosis not present

## 2015-05-26 DIAGNOSIS — J449 Chronic obstructive pulmonary disease, unspecified: Secondary | ICD-10-CM | POA: Diagnosis not present

## 2015-05-26 DIAGNOSIS — J189 Pneumonia, unspecified organism: Secondary | ICD-10-CM | POA: Diagnosis not present

## 2015-06-04 ENCOUNTER — Other Ambulatory Visit: Payer: Self-pay | Admitting: Cardiovascular Disease

## 2015-06-08 DIAGNOSIS — R5382 Chronic fatigue, unspecified: Secondary | ICD-10-CM | POA: Diagnosis not present

## 2015-06-08 DIAGNOSIS — M1990 Unspecified osteoarthritis, unspecified site: Secondary | ICD-10-CM | POA: Diagnosis not present

## 2015-06-08 DIAGNOSIS — M797 Fibromyalgia: Secondary | ICD-10-CM | POA: Diagnosis not present

## 2015-06-08 DIAGNOSIS — Z96659 Presence of unspecified artificial knee joint: Secondary | ICD-10-CM | POA: Diagnosis not present

## 2015-06-08 DIAGNOSIS — M5136 Other intervertebral disc degeneration, lumbar region: Secondary | ICD-10-CM | POA: Diagnosis not present

## 2015-06-08 DIAGNOSIS — R2689 Other abnormalities of gait and mobility: Secondary | ICD-10-CM | POA: Diagnosis not present

## 2015-06-08 DIAGNOSIS — J449 Chronic obstructive pulmonary disease, unspecified: Secondary | ICD-10-CM | POA: Diagnosis not present

## 2015-06-08 DIAGNOSIS — R0689 Other abnormalities of breathing: Secondary | ICD-10-CM | POA: Diagnosis not present

## 2015-06-08 DIAGNOSIS — I110 Hypertensive heart disease with heart failure: Secondary | ICD-10-CM | POA: Diagnosis not present

## 2015-06-08 DIAGNOSIS — I5032 Chronic diastolic (congestive) heart failure: Secondary | ICD-10-CM | POA: Diagnosis not present

## 2015-06-08 DIAGNOSIS — C84A0 Cutaneous T-cell lymphoma, unspecified, unspecified site: Secondary | ICD-10-CM | POA: Diagnosis not present

## 2015-06-08 DIAGNOSIS — J441 Chronic obstructive pulmonary disease with (acute) exacerbation: Secondary | ICD-10-CM | POA: Diagnosis not present

## 2015-06-08 DIAGNOSIS — I480 Paroxysmal atrial fibrillation: Secondary | ICD-10-CM | POA: Diagnosis not present

## 2015-06-09 DIAGNOSIS — R2689 Other abnormalities of gait and mobility: Secondary | ICD-10-CM | POA: Diagnosis not present

## 2015-06-09 DIAGNOSIS — J441 Chronic obstructive pulmonary disease with (acute) exacerbation: Secondary | ICD-10-CM | POA: Diagnosis not present

## 2015-06-09 DIAGNOSIS — M1990 Unspecified osteoarthritis, unspecified site: Secondary | ICD-10-CM | POA: Diagnosis not present

## 2015-06-09 DIAGNOSIS — C84A0 Cutaneous T-cell lymphoma, unspecified, unspecified site: Secondary | ICD-10-CM | POA: Diagnosis not present

## 2015-06-09 DIAGNOSIS — M797 Fibromyalgia: Secondary | ICD-10-CM | POA: Diagnosis not present

## 2015-06-09 DIAGNOSIS — I5032 Chronic diastolic (congestive) heart failure: Secondary | ICD-10-CM | POA: Diagnosis not present

## 2015-06-09 DIAGNOSIS — R5382 Chronic fatigue, unspecified: Secondary | ICD-10-CM | POA: Diagnosis not present

## 2015-06-09 DIAGNOSIS — I110 Hypertensive heart disease with heart failure: Secondary | ICD-10-CM | POA: Diagnosis not present

## 2015-06-09 DIAGNOSIS — I480 Paroxysmal atrial fibrillation: Secondary | ICD-10-CM | POA: Diagnosis not present

## 2015-06-10 DIAGNOSIS — I110 Hypertensive heart disease with heart failure: Secondary | ICD-10-CM | POA: Diagnosis not present

## 2015-06-10 DIAGNOSIS — I5032 Chronic diastolic (congestive) heart failure: Secondary | ICD-10-CM | POA: Diagnosis not present

## 2015-06-10 DIAGNOSIS — M797 Fibromyalgia: Secondary | ICD-10-CM | POA: Diagnosis not present

## 2015-06-10 DIAGNOSIS — M1990 Unspecified osteoarthritis, unspecified site: Secondary | ICD-10-CM | POA: Diagnosis not present

## 2015-06-10 DIAGNOSIS — R2689 Other abnormalities of gait and mobility: Secondary | ICD-10-CM | POA: Diagnosis not present

## 2015-06-10 DIAGNOSIS — I480 Paroxysmal atrial fibrillation: Secondary | ICD-10-CM | POA: Diagnosis not present

## 2015-06-10 DIAGNOSIS — R5382 Chronic fatigue, unspecified: Secondary | ICD-10-CM | POA: Diagnosis not present

## 2015-06-10 DIAGNOSIS — J441 Chronic obstructive pulmonary disease with (acute) exacerbation: Secondary | ICD-10-CM | POA: Diagnosis not present

## 2015-06-10 DIAGNOSIS — C84A0 Cutaneous T-cell lymphoma, unspecified, unspecified site: Secondary | ICD-10-CM | POA: Diagnosis not present

## 2015-06-11 DIAGNOSIS — M797 Fibromyalgia: Secondary | ICD-10-CM | POA: Diagnosis not present

## 2015-06-11 DIAGNOSIS — I5032 Chronic diastolic (congestive) heart failure: Secondary | ICD-10-CM | POA: Diagnosis not present

## 2015-06-11 DIAGNOSIS — I110 Hypertensive heart disease with heart failure: Secondary | ICD-10-CM | POA: Diagnosis not present

## 2015-06-11 DIAGNOSIS — I480 Paroxysmal atrial fibrillation: Secondary | ICD-10-CM | POA: Diagnosis not present

## 2015-06-11 DIAGNOSIS — M1990 Unspecified osteoarthritis, unspecified site: Secondary | ICD-10-CM | POA: Diagnosis not present

## 2015-06-11 DIAGNOSIS — C84A0 Cutaneous T-cell lymphoma, unspecified, unspecified site: Secondary | ICD-10-CM | POA: Diagnosis not present

## 2015-06-11 DIAGNOSIS — R2689 Other abnormalities of gait and mobility: Secondary | ICD-10-CM | POA: Diagnosis not present

## 2015-06-11 DIAGNOSIS — R5382 Chronic fatigue, unspecified: Secondary | ICD-10-CM | POA: Diagnosis not present

## 2015-06-11 DIAGNOSIS — J441 Chronic obstructive pulmonary disease with (acute) exacerbation: Secondary | ICD-10-CM | POA: Diagnosis not present

## 2015-06-15 DIAGNOSIS — M1990 Unspecified osteoarthritis, unspecified site: Secondary | ICD-10-CM | POA: Diagnosis not present

## 2015-06-15 DIAGNOSIS — R5382 Chronic fatigue, unspecified: Secondary | ICD-10-CM | POA: Diagnosis not present

## 2015-06-15 DIAGNOSIS — J441 Chronic obstructive pulmonary disease with (acute) exacerbation: Secondary | ICD-10-CM | POA: Diagnosis not present

## 2015-06-15 DIAGNOSIS — R2689 Other abnormalities of gait and mobility: Secondary | ICD-10-CM | POA: Diagnosis not present

## 2015-06-15 DIAGNOSIS — I5032 Chronic diastolic (congestive) heart failure: Secondary | ICD-10-CM | POA: Diagnosis not present

## 2015-06-15 DIAGNOSIS — I110 Hypertensive heart disease with heart failure: Secondary | ICD-10-CM | POA: Diagnosis not present

## 2015-06-15 DIAGNOSIS — M797 Fibromyalgia: Secondary | ICD-10-CM | POA: Diagnosis not present

## 2015-06-15 DIAGNOSIS — I480 Paroxysmal atrial fibrillation: Secondary | ICD-10-CM | POA: Diagnosis not present

## 2015-06-15 DIAGNOSIS — C84A0 Cutaneous T-cell lymphoma, unspecified, unspecified site: Secondary | ICD-10-CM | POA: Diagnosis not present

## 2015-06-16 DIAGNOSIS — R5382 Chronic fatigue, unspecified: Secondary | ICD-10-CM | POA: Diagnosis not present

## 2015-06-16 DIAGNOSIS — R309 Painful micturition, unspecified: Secondary | ICD-10-CM | POA: Diagnosis not present

## 2015-06-16 DIAGNOSIS — I482 Chronic atrial fibrillation: Secondary | ICD-10-CM | POA: Diagnosis not present

## 2015-06-16 DIAGNOSIS — I5032 Chronic diastolic (congestive) heart failure: Secondary | ICD-10-CM | POA: Diagnosis not present

## 2015-06-16 DIAGNOSIS — M1990 Unspecified osteoarthritis, unspecified site: Secondary | ICD-10-CM | POA: Diagnosis not present

## 2015-06-16 DIAGNOSIS — J441 Chronic obstructive pulmonary disease with (acute) exacerbation: Secondary | ICD-10-CM | POA: Diagnosis not present

## 2015-06-16 DIAGNOSIS — M797 Fibromyalgia: Secondary | ICD-10-CM | POA: Diagnosis not present

## 2015-06-16 DIAGNOSIS — R2689 Other abnormalities of gait and mobility: Secondary | ICD-10-CM | POA: Diagnosis not present

## 2015-06-16 DIAGNOSIS — G20 Parkinson's disease: Secondary | ICD-10-CM | POA: Diagnosis not present

## 2015-06-16 DIAGNOSIS — J9611 Chronic respiratory failure with hypoxia: Secondary | ICD-10-CM | POA: Diagnosis not present

## 2015-06-16 DIAGNOSIS — I480 Paroxysmal atrial fibrillation: Secondary | ICD-10-CM | POA: Diagnosis not present

## 2015-06-16 DIAGNOSIS — J449 Chronic obstructive pulmonary disease, unspecified: Secondary | ICD-10-CM | POA: Diagnosis not present

## 2015-06-16 DIAGNOSIS — I110 Hypertensive heart disease with heart failure: Secondary | ICD-10-CM | POA: Diagnosis not present

## 2015-06-16 DIAGNOSIS — G894 Chronic pain syndrome: Secondary | ICD-10-CM | POA: Diagnosis not present

## 2015-06-16 DIAGNOSIS — C84A0 Cutaneous T-cell lymphoma, unspecified, unspecified site: Secondary | ICD-10-CM | POA: Diagnosis not present

## 2015-06-16 DIAGNOSIS — Z79899 Other long term (current) drug therapy: Secondary | ICD-10-CM | POA: Diagnosis not present

## 2015-06-16 DIAGNOSIS — N390 Urinary tract infection, site not specified: Secondary | ICD-10-CM | POA: Diagnosis not present

## 2015-06-17 DIAGNOSIS — I480 Paroxysmal atrial fibrillation: Secondary | ICD-10-CM | POA: Diagnosis not present

## 2015-06-17 DIAGNOSIS — R5382 Chronic fatigue, unspecified: Secondary | ICD-10-CM | POA: Diagnosis not present

## 2015-06-17 DIAGNOSIS — J441 Chronic obstructive pulmonary disease with (acute) exacerbation: Secondary | ICD-10-CM | POA: Diagnosis not present

## 2015-06-17 DIAGNOSIS — I110 Hypertensive heart disease with heart failure: Secondary | ICD-10-CM | POA: Diagnosis not present

## 2015-06-17 DIAGNOSIS — R2689 Other abnormalities of gait and mobility: Secondary | ICD-10-CM | POA: Diagnosis not present

## 2015-06-17 DIAGNOSIS — C84A0 Cutaneous T-cell lymphoma, unspecified, unspecified site: Secondary | ICD-10-CM | POA: Diagnosis not present

## 2015-06-17 DIAGNOSIS — I5032 Chronic diastolic (congestive) heart failure: Secondary | ICD-10-CM | POA: Diagnosis not present

## 2015-06-17 DIAGNOSIS — M797 Fibromyalgia: Secondary | ICD-10-CM | POA: Diagnosis not present

## 2015-06-17 DIAGNOSIS — M1990 Unspecified osteoarthritis, unspecified site: Secondary | ICD-10-CM | POA: Diagnosis not present

## 2015-06-18 DIAGNOSIS — M545 Low back pain: Secondary | ICD-10-CM | POA: Diagnosis not present

## 2015-06-19 DIAGNOSIS — M1990 Unspecified osteoarthritis, unspecified site: Secondary | ICD-10-CM | POA: Diagnosis not present

## 2015-06-19 DIAGNOSIS — J441 Chronic obstructive pulmonary disease with (acute) exacerbation: Secondary | ICD-10-CM | POA: Diagnosis not present

## 2015-06-19 DIAGNOSIS — I110 Hypertensive heart disease with heart failure: Secondary | ICD-10-CM | POA: Diagnosis not present

## 2015-06-19 DIAGNOSIS — R5382 Chronic fatigue, unspecified: Secondary | ICD-10-CM | POA: Diagnosis not present

## 2015-06-19 DIAGNOSIS — R2689 Other abnormalities of gait and mobility: Secondary | ICD-10-CM | POA: Diagnosis not present

## 2015-06-19 DIAGNOSIS — I480 Paroxysmal atrial fibrillation: Secondary | ICD-10-CM | POA: Diagnosis not present

## 2015-06-19 DIAGNOSIS — I5032 Chronic diastolic (congestive) heart failure: Secondary | ICD-10-CM | POA: Diagnosis not present

## 2015-06-19 DIAGNOSIS — M797 Fibromyalgia: Secondary | ICD-10-CM | POA: Diagnosis not present

## 2015-06-19 DIAGNOSIS — C84A0 Cutaneous T-cell lymphoma, unspecified, unspecified site: Secondary | ICD-10-CM | POA: Diagnosis not present

## 2015-06-22 ENCOUNTER — Telehealth: Payer: Self-pay | Admitting: Cardiovascular Disease

## 2015-06-22 DIAGNOSIS — J441 Chronic obstructive pulmonary disease with (acute) exacerbation: Secondary | ICD-10-CM | POA: Diagnosis not present

## 2015-06-22 DIAGNOSIS — R5382 Chronic fatigue, unspecified: Secondary | ICD-10-CM | POA: Diagnosis not present

## 2015-06-22 DIAGNOSIS — I110 Hypertensive heart disease with heart failure: Secondary | ICD-10-CM | POA: Diagnosis not present

## 2015-06-22 DIAGNOSIS — I5032 Chronic diastolic (congestive) heart failure: Secondary | ICD-10-CM | POA: Diagnosis not present

## 2015-06-22 DIAGNOSIS — I480 Paroxysmal atrial fibrillation: Secondary | ICD-10-CM | POA: Diagnosis not present

## 2015-06-22 DIAGNOSIS — M797 Fibromyalgia: Secondary | ICD-10-CM | POA: Diagnosis not present

## 2015-06-22 DIAGNOSIS — M1990 Unspecified osteoarthritis, unspecified site: Secondary | ICD-10-CM | POA: Diagnosis not present

## 2015-06-22 DIAGNOSIS — C84A0 Cutaneous T-cell lymphoma, unspecified, unspecified site: Secondary | ICD-10-CM | POA: Diagnosis not present

## 2015-06-22 DIAGNOSIS — R2689 Other abnormalities of gait and mobility: Secondary | ICD-10-CM | POA: Diagnosis not present

## 2015-06-22 NOTE — Telephone Encounter (Signed)
Nurse with [facility] states pt is having lightheadedness, dizziness with neck movement. Please call.

## 2015-06-22 NOTE — Telephone Encounter (Signed)
Spoke w/ Izora Gala, RN w/ [facility].  She reports that pt is having dizziness & lightheadedness when she moves her head a certain way.  She contacted pt's PCP and asked if she needed appt w/ Dr. Rockey Situ, but PCP did not necessarily feel this was appropriate.  Nurse is calling to see about having pt's carotids evaluated, she does not feel that sx are r/t vertigo, that her sx seem more "carotid related". Transferred call to scheduling to set appt up w/ Dr. Rockey Situ, as pt has not been seen in our office since 03/2014.

## 2015-06-24 DIAGNOSIS — I110 Hypertensive heart disease with heart failure: Secondary | ICD-10-CM | POA: Diagnosis not present

## 2015-06-24 DIAGNOSIS — J441 Chronic obstructive pulmonary disease with (acute) exacerbation: Secondary | ICD-10-CM | POA: Diagnosis not present

## 2015-06-24 DIAGNOSIS — R2689 Other abnormalities of gait and mobility: Secondary | ICD-10-CM | POA: Diagnosis not present

## 2015-06-24 DIAGNOSIS — M1990 Unspecified osteoarthritis, unspecified site: Secondary | ICD-10-CM | POA: Diagnosis not present

## 2015-06-24 DIAGNOSIS — M797 Fibromyalgia: Secondary | ICD-10-CM | POA: Diagnosis not present

## 2015-06-24 DIAGNOSIS — I480 Paroxysmal atrial fibrillation: Secondary | ICD-10-CM | POA: Diagnosis not present

## 2015-06-24 DIAGNOSIS — I5032 Chronic diastolic (congestive) heart failure: Secondary | ICD-10-CM | POA: Diagnosis not present

## 2015-06-24 DIAGNOSIS — R5382 Chronic fatigue, unspecified: Secondary | ICD-10-CM | POA: Diagnosis not present

## 2015-06-24 DIAGNOSIS — C84A0 Cutaneous T-cell lymphoma, unspecified, unspecified site: Secondary | ICD-10-CM | POA: Diagnosis not present

## 2015-06-29 DIAGNOSIS — C84A0 Cutaneous T-cell lymphoma, unspecified, unspecified site: Secondary | ICD-10-CM | POA: Diagnosis not present

## 2015-06-29 DIAGNOSIS — R2689 Other abnormalities of gait and mobility: Secondary | ICD-10-CM | POA: Diagnosis not present

## 2015-06-29 DIAGNOSIS — M797 Fibromyalgia: Secondary | ICD-10-CM | POA: Diagnosis not present

## 2015-06-29 DIAGNOSIS — M1990 Unspecified osteoarthritis, unspecified site: Secondary | ICD-10-CM | POA: Diagnosis not present

## 2015-06-29 DIAGNOSIS — R5382 Chronic fatigue, unspecified: Secondary | ICD-10-CM | POA: Diagnosis not present

## 2015-06-29 DIAGNOSIS — I110 Hypertensive heart disease with heart failure: Secondary | ICD-10-CM | POA: Diagnosis not present

## 2015-06-29 DIAGNOSIS — I480 Paroxysmal atrial fibrillation: Secondary | ICD-10-CM | POA: Diagnosis not present

## 2015-06-29 DIAGNOSIS — I5032 Chronic diastolic (congestive) heart failure: Secondary | ICD-10-CM | POA: Diagnosis not present

## 2015-06-29 DIAGNOSIS — J441 Chronic obstructive pulmonary disease with (acute) exacerbation: Secondary | ICD-10-CM | POA: Diagnosis not present

## 2015-07-01 DIAGNOSIS — C84A0 Cutaneous T-cell lymphoma, unspecified, unspecified site: Secondary | ICD-10-CM | POA: Diagnosis not present

## 2015-07-01 DIAGNOSIS — R2689 Other abnormalities of gait and mobility: Secondary | ICD-10-CM | POA: Diagnosis not present

## 2015-07-01 DIAGNOSIS — J441 Chronic obstructive pulmonary disease with (acute) exacerbation: Secondary | ICD-10-CM | POA: Diagnosis not present

## 2015-07-01 DIAGNOSIS — M1990 Unspecified osteoarthritis, unspecified site: Secondary | ICD-10-CM | POA: Diagnosis not present

## 2015-07-01 DIAGNOSIS — M797 Fibromyalgia: Secondary | ICD-10-CM | POA: Diagnosis not present

## 2015-07-01 DIAGNOSIS — R5382 Chronic fatigue, unspecified: Secondary | ICD-10-CM | POA: Diagnosis not present

## 2015-07-01 DIAGNOSIS — I5032 Chronic diastolic (congestive) heart failure: Secondary | ICD-10-CM | POA: Diagnosis not present

## 2015-07-01 DIAGNOSIS — I110 Hypertensive heart disease with heart failure: Secondary | ICD-10-CM | POA: Diagnosis not present

## 2015-07-01 DIAGNOSIS — I480 Paroxysmal atrial fibrillation: Secondary | ICD-10-CM | POA: Diagnosis not present

## 2015-07-02 DIAGNOSIS — I5032 Chronic diastolic (congestive) heart failure: Secondary | ICD-10-CM | POA: Diagnosis not present

## 2015-07-02 DIAGNOSIS — M797 Fibromyalgia: Secondary | ICD-10-CM | POA: Diagnosis not present

## 2015-07-02 DIAGNOSIS — M1990 Unspecified osteoarthritis, unspecified site: Secondary | ICD-10-CM | POA: Diagnosis not present

## 2015-07-02 DIAGNOSIS — R2689 Other abnormalities of gait and mobility: Secondary | ICD-10-CM | POA: Diagnosis not present

## 2015-07-02 DIAGNOSIS — I110 Hypertensive heart disease with heart failure: Secondary | ICD-10-CM | POA: Diagnosis not present

## 2015-07-02 DIAGNOSIS — R5382 Chronic fatigue, unspecified: Secondary | ICD-10-CM | POA: Diagnosis not present

## 2015-07-02 DIAGNOSIS — I480 Paroxysmal atrial fibrillation: Secondary | ICD-10-CM | POA: Diagnosis not present

## 2015-07-02 DIAGNOSIS — J441 Chronic obstructive pulmonary disease with (acute) exacerbation: Secondary | ICD-10-CM | POA: Diagnosis not present

## 2015-07-02 DIAGNOSIS — C84A0 Cutaneous T-cell lymphoma, unspecified, unspecified site: Secondary | ICD-10-CM | POA: Diagnosis not present

## 2015-07-06 DIAGNOSIS — M1990 Unspecified osteoarthritis, unspecified site: Secondary | ICD-10-CM | POA: Diagnosis not present

## 2015-07-06 DIAGNOSIS — J441 Chronic obstructive pulmonary disease with (acute) exacerbation: Secondary | ICD-10-CM | POA: Diagnosis not present

## 2015-07-06 DIAGNOSIS — I480 Paroxysmal atrial fibrillation: Secondary | ICD-10-CM | POA: Diagnosis not present

## 2015-07-06 DIAGNOSIS — I110 Hypertensive heart disease with heart failure: Secondary | ICD-10-CM | POA: Diagnosis not present

## 2015-07-06 DIAGNOSIS — M797 Fibromyalgia: Secondary | ICD-10-CM | POA: Diagnosis not present

## 2015-07-06 DIAGNOSIS — R2689 Other abnormalities of gait and mobility: Secondary | ICD-10-CM | POA: Diagnosis not present

## 2015-07-06 DIAGNOSIS — R5382 Chronic fatigue, unspecified: Secondary | ICD-10-CM | POA: Diagnosis not present

## 2015-07-06 DIAGNOSIS — I5032 Chronic diastolic (congestive) heart failure: Secondary | ICD-10-CM | POA: Diagnosis not present

## 2015-07-06 DIAGNOSIS — C84A0 Cutaneous T-cell lymphoma, unspecified, unspecified site: Secondary | ICD-10-CM | POA: Diagnosis not present

## 2015-07-07 ENCOUNTER — Encounter: Payer: Self-pay | Admitting: Cardiovascular Disease

## 2015-07-07 ENCOUNTER — Ambulatory Visit (INDEPENDENT_AMBULATORY_CARE_PROVIDER_SITE_OTHER): Payer: Commercial Managed Care - HMO | Admitting: Cardiovascular Disease

## 2015-07-07 VITALS — BP 130/58 | HR 67 | Ht 62.0 in | Wt 185.2 lb

## 2015-07-07 DIAGNOSIS — R0602 Shortness of breath: Secondary | ICD-10-CM

## 2015-07-07 DIAGNOSIS — I480 Paroxysmal atrial fibrillation: Secondary | ICD-10-CM

## 2015-07-07 DIAGNOSIS — J9601 Acute respiratory failure with hypoxia: Secondary | ICD-10-CM

## 2015-07-07 DIAGNOSIS — R079 Chest pain, unspecified: Secondary | ICD-10-CM

## 2015-07-07 DIAGNOSIS — J432 Centrilobular emphysema: Secondary | ICD-10-CM

## 2015-07-07 DIAGNOSIS — I5032 Chronic diastolic (congestive) heart failure: Secondary | ICD-10-CM

## 2015-07-07 DIAGNOSIS — R251 Tremor, unspecified: Secondary | ICD-10-CM

## 2015-07-07 DIAGNOSIS — I421 Obstructive hypertrophic cardiomyopathy: Secondary | ICD-10-CM

## 2015-07-07 DIAGNOSIS — R42 Dizziness and giddiness: Secondary | ICD-10-CM

## 2015-07-07 NOTE — Assessment & Plan Note (Signed)
She does report having periodic shortness breath Notes above indicate air trapping Recommended she use albuterol for any breakthrough shortness of breath symptoms

## 2015-07-07 NOTE — Assessment & Plan Note (Signed)
She currently takes high-dose torsemide 40 mg in the morning, 20 mg in the evening She does report results of shortness of breath Recommended she try 40 mg twice a day for several days to see if symptoms improve If no improvement, would go back to her original dosing At that point would use albuterol for shortness of breath symptoms

## 2015-07-07 NOTE — Patient Instructions (Addendum)
You are doing well. No medication changes were made.  For shortness of breath, Try albuterol Take extra torsemide 2 pills twice a day as needed  Please call us if you have new issues that need to be addressed before your next appt.  Your physician wants you to follow-up in: 6 months.  You will receive a reminder letter in the mail two months in advance. If you don't receive a letter, please call our office to schedule the follow-up appointment.

## 2015-07-07 NOTE — Assessment & Plan Note (Signed)
Recent PNA April 2017, still feels weak, chest congestion has dramatically improved Hospital records reviewed

## 2015-07-07 NOTE — Assessment & Plan Note (Signed)
Murmur on exam consistent with septal thickening Prior echocardiogram in the past several years with no significant outflow tract gradient Echocardiogram from 2009 with output tract gradient, SAM. This may be contributing to lightheaded symptoms with Valsalva. Long discussion concerning this, recommended she not do any breath holding, be careful when she reaches for items or bends over to pick up items off the floor

## 2015-07-07 NOTE — Assessment & Plan Note (Signed)
Review of hospital records and EKG today and by patient's symptoms, appears that she is maintaining normal sinus rhythm No changes to her medications

## 2015-07-07 NOTE — Progress Notes (Signed)
Patient ID: Hailey [name], female    DOB: [birth year], 82 y.o.   MRN: [id number]  HPI Comments: Hailey [name] is a 82 y.o. female w/ PMHx s/f HOCM, h/o DVT (s/p IVC filter), h/o PUD/GIB, PAT, cerebral aneurysm, COPD, pulmonary HTN, HTN, HLD, OSA (intolerant to CPAP, on 2L via n/c), fibromyalgia and anxiety who was admitted to [facility] 2/25 to 04/11/13 for new onset atrial fibrillation w/ RVR. baseline exertional dyspnea- multifactorial due to deconditioning, COPD. Underwent pulmonary rehab in 2014, followed by Dr. Gwenette Greet. History of paroxysmal atrial fibrillation.She was not started on anticoagulation given a history of GI bleeding.  h/o profuse GIB in the past, required IVC filter for DVT in the past.  Admitted to the hospital February 2015, April 2015, again in June She has a diagnosis of T-cell lymphoma She presents today for routine follow-up of her atrial fibrillation  In follow-up today, she reports having pneumonia 05/19/2015, hospitalized at that time with discharge 05/25/2015 Hospital records reviewed She initially presented with hypoxia, elevated temperature, shortness of breath, sputum Treated with Zosyn initially, changed to Augmentin She spent time in rehabilitation at [facility] in the independent living at different facility She continues to take torsemide 40 mg in the morning, 20 mg in the evening  She still feels weak, has not been going down to meals in the cafeteria, has been eating in her room. Reports having episodes of choking while she is sitting, difficult time breathing. She has been maintained on nasal cannula oxygen. She's not been taking her albuterol inhaler as needed for these episodes. Also reports that when she leans over, reaches for something, she has near syncope, lightheadedness. She has general weakness, malaise. Continues to do with chronic back pain from compression fractures. She reports that her tremor has got worse, recently started on Sinemet presumably for  early Parkinson's  No regular exercise, she has finished her PT and OT  EKG on today's visit shows normal sinus rhythm with rate 67 bpm, but specific ST abnormality, baseline artifact from tremor  Other past medical history   in 12/02/2013 she had a viral infection, lab work showing severe dehydration, low potassium.  developed pneumonia,  treated for bronchitis January 2016.   diagnosis of T-cell lymphoma, seen by dermatology.  She has skin changes on her lower extremities, and in various places on her body. Reports having a ventral hernia but with no symptoms  In June 2015 on a clinic visit she reported having severe shortness of breath, leg swelling, weight gain. Torsemide 80 mg was not working for her. Metolazone was added. She started on metolazone and after one dose, had rapid 10 pound weight loss per the patient. She then took only half pills and reported having minimal diuresis. She did present to the hospital she did not feel well. Since her discharge, she has had creatinine above her baseline. Lab work 08/06/2011 showing creatinine 1.4, elevated BUN. At this time she was on torsemide 80 mg daily. It was decreased down to 60 mg daily Lab work 08/08/2013 showed creatinine 1.6, BUN 50 with potassium 3.0. Torsemide dosing decreased down to 40 mg daily  history of severe neck pain, radiating pain up to her head with headaches   Allergies  Allergen Reactions  . Aspirin Other (See Comments)    Reaction:  GI bleeding   . Codeine Nausea And Vomiting  . Ketorolac Tromethamine Other (See Comments)    Reaction:  Headache   . Nsaids Other (See Comments)  Reaction:  GI bleeding   . Phenazopyridine Hcl Other (See Comments)    Reaction:  Vision problems     Outpatient Encounter Prescriptions as of 07/07/2015  Medication Sig  . acetaminophen (TYLENOL) 325 MG tablet Take 2 tablets (650 mg total) by mouth every 6 (six) hours as needed for mild pain or moderate pain (temp > 101.5).  Marland Kitchen  albuterol (PROVENTIL) (2.5 MG/3ML) 0.083% nebulizer solution Take 3 mLs (2.5 mg total) by nebulization every 4 (four) hours as needed for wheezing or shortness of breath.  Marland Kitchen alendronate (FOSAMAX) 70 MG tablet Take 70 mg by mouth once a week. Take with a full glass of water on an empty stomach.  Marland Kitchen amiodarone (PACERONE) 200 MG tablet Take 200 mg by mouth daily. Pt is able to take an additional tablet if needed for breakthrough arrhythmias.  Marland Kitchen amoxicillin-clavulanate (AUGMENTIN) 875-125 MG tablet Take 1 tablet by mouth 2 (two) times daily.  . Carbidopa-Levodopa ER (SINEMET CR) 25-100 MG tablet controlled release Take 1 tablet by mouth 2 (two) times daily.  Marland Kitchen dimenhyDRINATE (DRAMAMINE) 50 MG tablet Take 25-50 mg by mouth every 6 (six) hours as needed for dizziness.   . docusate sodium (COLACE) 100 MG capsule Take 100 mg by mouth 2 (two) times daily as needed for mild constipation.  . DULoxetine (CYMBALTA) 30 MG capsule Take 30 mg by mouth daily. Pt takes with a 60mg  capsule.  . DULoxetine (CYMBALTA) 60 MG capsule Take 60 mg by mouth daily. Pt takes with a 30mg  capsule.  . feeding supplement, ENSURE ENLIVE, (ENSURE ENLIVE) LIQD Take 237 mLs by mouth 2 (two) times daily between meals.  . fluticasone (FLONASE) 50 MCG/ACT nasal spray Place 2 sprays into both nostrils daily as needed for rhinitis.  Marland Kitchen gabapentin (NEURONTIN) 100 MG capsule Take 200-400 mg by mouth 3 (three) times daily. Pt takes three capsules in the morning, two capsules in the evening, and four capsules at bedtime.  Marland Kitchen losartan (COZAAR) 50 MG tablet TAKE ONE-HALF (1/2) TABLET (25MG ) BY MOUTH DAILY  . lovastatin (MEVACOR) 40 MG tablet Take 40 mg by mouth at bedtime.  . mometasone-formoterol (DULERA) 100-5 MCG/ACT AERO Inhale 2 puffs into the lungs 2 (two) times daily.  . montelukast (SINGULAIR) 10 MG tablet Take 10 mg by mouth at bedtime.  . nitrofurantoin, macrocrystal-monohydrate, (MACROBID) 100 MG capsule Take 1 capsule (100 mg total) by  mouth at bedtime.  Marland Kitchen oxyCODONE (OXY IR/ROXICODONE) 5 MG immediate release tablet Take 1 tablet (5 mg total) by mouth every 6 (six) hours as needed for moderate pain or severe pain.  . pantoprazole (PROTONIX) 40 MG tablet Take 40 mg by mouth daily.  . polyethylene glycol (MIRALAX / GLYCOLAX) packet Take 17 g by mouth daily as needed for moderate constipation.   . potassium chloride (K-DUR,KLOR-CON) 10 MEQ tablet Take 20 mEq by mouth 2 (two) times daily.  Marland Kitchen senna (SENOKOT) 8.6 MG TABS tablet Take 2 tablets (17.2 mg total) by mouth 2 (two) times daily.  Marland Kitchen tiotropium (SPIRIVA) 18 MCG inhalation capsule Place 18 mcg into inhaler and inhale daily.  Marland Kitchen torsemide (DEMADEX) 20 MG tablet Take 20-40 mg by mouth 2 (two) times daily as needed (for edema). Pt takes two tablets in the morning and one tablet in the evening if needed for edema.  . triamcinolone ointment (KENALOG) 0.1 % Apply 1 application topically 2 (two) times daily as needed (for rash).   . [DISCONTINUED] HYDROcodone-homatropine (HYCODAN) 5-1.5 MG/5ML syrup Take 5 mLs by mouth every 6 (  six) hours as needed for cough. (Patient not taking: Reported on 07/07/2015)  . [DISCONTINUED] Melatonin 5 MG CAPS Take 5 mg by mouth at bedtime as needed (for sleep). Reported on 07/07/2015  . [DISCONTINUED] metoprolol succinate (TOPROL-XL) 25 MG 24 hr tablet Take 25 mg by mouth at bedtime. Reported on 07/07/2015  . [DISCONTINUED] nystatin (MYCOSTATIN) 100000 UNIT/ML suspension Use as directed 5 mLs (500,000 Units total) in the mouth or throat 4 (four) times daily. (Patient not taking: Reported on 07/07/2015)  . [DISCONTINUED] prednisoLONE 5 MG TABS tablet Take 1 tablet (5 mg total) by mouth daily. Label  & dispense according to the schedule below: 10 Pills PO for 3 days, followed by 8   Pills PO for 3 days, followed by 6   Pills PO for 3 days, followed by 4   Pills PO for 3 days, followed by 2   Pills PO for 3 days, followed by 1   Pills PO for 3 days then STOP.  (Patient not taking: Reported on 07/07/2015)  . [DISCONTINUED] traMADol (ULTRAM) 50 MG tablet Take 50 mg by mouth 2 (two) times daily as needed for moderate pain. Reported on 07/07/2015  . [DISCONTINUED] zolpidem (AMBIEN) 5 MG tablet Take 1 tablet (5 mg total) by mouth at bedtime as needed for sleep. (Patient not taking: Reported on 07/07/2015)   No facility-administered encounter medications on file as of 07/07/2015.    Past Medical History  Diagnosis Date  . DVT (deep vein thrombosis) in pregnancy 09/2008    a. LLE, recurrent hx, has IVC filter. Not on coumadin now with history of GI bleeding  . Hyperlipidemia   . GERD (gastroesophageal reflux disease)   . Hypertrophic cardiomyopathy ([place])     a. Echo 4/09 with EF 70-75%, asymmetricy basal septal hypertrophy, mild MR without systolic anterior motion of the mitral valve, LVOT gradient to 130 mmHg with Valsalva b. Echo 7/13: EF 60-65%, mild focal basal septal hypertrophy, no significant LVOT gradient, no MV SAM c. Echo 2/15: EF 55-60%, HOCM, resting LVOT gradient 29 mmHg, Valsalva LVOT gradient > 140 mmHg, mild LVH, mild TR, elevated PASP  . Anemia     Chronic  . Osteoarthritis     Hx of left TKR  . Fibromyalgia   . Cerebral aneurysm     Hx of  . Migraine headache     Hx of  . Hypertension   . COPD (chronic obstructive pulmonary disease) ([place])   . PUD (peptic ulcer disease)     With GI bleeding  . History of colonoscopy   . PAF (paroxysmal atrial fibrillation) ([place])     a. Full-dose ASA alone 2/2 h/o GIB.  Marland Kitchen OSA (obstructive sleep apnea)     a. Intolerant to CPAP, wears 2L via n/c  . Fibrocystic disease of breast   . Anxiety   . PAT (paroxysmal atrial tachycardia) ([place])   . Pneumonia   . 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 Asthma   . CHF (congestive heart failure) ([place])   . Arthritis   . Tremor   . Heart murmur   . Diverticulitis   . Pulmonary embolism ([place])   . Atrophic vaginitis   .  Incomplete bladder emptying   . Gross hematuria   . Obesity   . Gout     Past Surgical History  Procedure Laterality Date  . Total knee arthroplasty      Left  . Bladder suspension    . Foot  surgery      Bilateral  . Inguinal hernia repair  1968    Right  . Cerebral aneurysm repair  1998  . Abdominal hysterectomy    . Appendectomy    . Tonsillectomy and adenoidectomy    . Shoulder surgery  1972    Right   Left 2012  . Knee arthroscopy  2009    Right  . Cardiac catheterization  2009    No significant CAD  . Cardiovascular stress test      a. Lexiscan Myoview 3/14: EF 76%, no evidence of ischemia or WMAs  . Cataract surgery      Social History  reports that she quit smoking about 6 years ago. Her smoking use included Cigarettes. She has a 60 pack-year smoking history. She does not have any smokeless tobacco history on file. She reports that she drinks alcohol. She reports that she does not use illicit drugs.  Family History family history includes Colon cancer in her brother and father; Colon polyps in her father and other; Diabetes Mellitus II in her mother; Heart disease in her father and sister; Hypertension in her mother; Pancreatic cancer in her mother; Stroke in her father; Ulcers in her brother.   Review of Systems  Constitutional: Positive for fatigue.  HENT: Negative.   Eyes: Negative.   Respiratory: Positive for shortness of breath.   Cardiovascular: Negative.   Gastrointestinal: Negative.   Endocrine: Negative.   Musculoskeletal: Positive for gait problem.  Skin: Positive for rash.  Allergic/Immunologic: Negative.   Neurological: Positive for tremors, weakness and light-headedness.  Hematological: Negative.   Psychiatric/Behavioral: Negative.   All other systems reviewed and are negative.   BP 130/58 mmHg  Pulse 67  Ht 5\' 2"  (1.575 m)  Wt 185 lb 4 oz (84.029 kg)  BMI 33.87 kg/m2  Physical Exam  Constitutional: She is oriented to person, place, and  time. She appears well-developed and well-nourished.  HENT:  Head: Normocephalic.  Nose: Nose normal.  Mouth/Throat: Oropharynx is clear and moist.  Eyes: Conjunctivae are normal. Pupils are equal, round, and reactive to light.  Neck: Normal range of motion. Neck supple. No JVD present.  Cardiovascular: Normal rate, regular rhythm, S1 normal, S2 normal and intact distal pulses.  Exam reveals no gallop and no friction rub.   Murmur heard.  Systolic murmur is present with a grade of 3/6  No significant lower extremity edema  Pulmonary/Chest: Effort normal and breath sounds normal. No respiratory distress. She has no wheezes. She has no rales. She exhibits no tenderness.  Abdominal: Soft. Bowel sounds are normal. She exhibits no distension. There is no tenderness.  Musculoskeletal: Normal range of motion. She exhibits no edema or tenderness.  Lymphadenopathy:    She has no cervical adenopathy.  Neurological: She is alert and oriented to person, place, and time. Coordination normal.  Skin: Skin is warm and dry. No rash noted. No erythema.  Psychiatric: She has a normal mood and affect. Her behavior is normal. Judgment and thought content normal.    Assessment and Plan  Nursing note and vitals reviewed.

## 2015-07-07 NOTE — Assessment & Plan Note (Signed)
She reports tremor, improved with the Sinemet started recently in the hospital per the patient She has follow-up with neurology   Total encounter time more than 25 minutes  Greater than 50% was spent in counseling and coordination of care with the patient

## 2015-07-08 ENCOUNTER — Telehealth: Payer: Self-pay | Admitting: Cardiovascular Disease

## 2015-07-08 DIAGNOSIS — I5032 Chronic diastolic (congestive) heart failure: Secondary | ICD-10-CM | POA: Diagnosis not present

## 2015-07-08 DIAGNOSIS — R2689 Other abnormalities of gait and mobility: Secondary | ICD-10-CM | POA: Diagnosis not present

## 2015-07-08 DIAGNOSIS — M5136 Other intervertebral disc degeneration, lumbar region: Secondary | ICD-10-CM | POA: Diagnosis not present

## 2015-07-08 DIAGNOSIS — J449 Chronic obstructive pulmonary disease, unspecified: Secondary | ICD-10-CM | POA: Diagnosis not present

## 2015-07-08 DIAGNOSIS — M1990 Unspecified osteoarthritis, unspecified site: Secondary | ICD-10-CM | POA: Diagnosis not present

## 2015-07-08 DIAGNOSIS — Z96659 Presence of unspecified artificial knee joint: Secondary | ICD-10-CM | POA: Diagnosis not present

## 2015-07-08 DIAGNOSIS — J441 Chronic obstructive pulmonary disease with (acute) exacerbation: Secondary | ICD-10-CM | POA: Diagnosis not present

## 2015-07-08 DIAGNOSIS — M797 Fibromyalgia: Secondary | ICD-10-CM | POA: Diagnosis not present

## 2015-07-08 DIAGNOSIS — R0689 Other abnormalities of breathing: Secondary | ICD-10-CM | POA: Diagnosis not present

## 2015-07-08 DIAGNOSIS — C84A0 Cutaneous T-cell lymphoma, unspecified, unspecified site: Secondary | ICD-10-CM | POA: Diagnosis not present

## 2015-07-08 DIAGNOSIS — R5382 Chronic fatigue, unspecified: Secondary | ICD-10-CM | POA: Diagnosis not present

## 2015-07-08 DIAGNOSIS — I480 Paroxysmal atrial fibrillation: Secondary | ICD-10-CM | POA: Diagnosis not present

## 2015-07-08 DIAGNOSIS — I110 Hypertensive heart disease with heart failure: Secondary | ICD-10-CM | POA: Diagnosis not present

## 2015-07-08 NOTE — Telephone Encounter (Signed)
Left message for Hailey [name] [facility] RN that Dr. Rockey Situ has not prescribed pulm meds, that he suggested she use a med that was already on her med list. Advised her to contact pt's PCP or pulmonologist if she needs rx for nebulizer. Asked her to call back if we can be of further assistance.

## 2015-07-08 NOTE — Telephone Encounter (Signed)
Nurse with advanced calling , states pt does not have a nebulizer, Please call.

## 2015-07-09 DIAGNOSIS — R200 Anesthesia of skin: Secondary | ICD-10-CM | POA: Diagnosis not present

## 2015-07-09 DIAGNOSIS — G20 Parkinson's disease: Secondary | ICD-10-CM | POA: Diagnosis not present

## 2015-07-09 DIAGNOSIS — R4189 Other symptoms and signs involving cognitive functions and awareness: Secondary | ICD-10-CM | POA: Diagnosis not present

## 2015-07-09 DIAGNOSIS — Z9889 Other specified postprocedural states: Secondary | ICD-10-CM | POA: Diagnosis not present

## 2015-07-09 DIAGNOSIS — G4733 Obstructive sleep apnea (adult) (pediatric): Secondary | ICD-10-CM | POA: Diagnosis not present

## 2015-07-09 DIAGNOSIS — Z8679 Personal history of other diseases of the circulatory system: Secondary | ICD-10-CM | POA: Diagnosis not present

## 2015-07-09 DIAGNOSIS — G44099 Other trigeminal autonomic cephalgias (TAC), not intractable: Secondary | ICD-10-CM | POA: Diagnosis not present

## 2015-07-17 ENCOUNTER — Other Ambulatory Visit: Payer: Self-pay | Admitting: *Deleted

## 2015-07-17 MED ORDER — TORSEMIDE 20 MG PO TABS: 20.0000 mg | ORAL_TABLET | Freq: Two times a day (BID) | ORAL | Status: DC | PRN

## 2015-07-20 ENCOUNTER — Other Ambulatory Visit: Payer: Self-pay | Admitting: *Deleted

## 2015-07-20 MED ORDER — POTASSIUM CHLORIDE CRYS ER 10 MEQ PO TBCR: 20.0000 meq | EXTENDED_RELEASE_TABLET | Freq: Two times a day (BID) | ORAL | Status: DC

## 2015-07-20 NOTE — Telephone Encounter (Signed)
Requested Prescriptions   Signed Prescriptions Disp Refills  . potassium chloride (K-DUR,KLOR-CON) 10 MEQ tablet 120 tablet 3    Sig: Take 2 tablets (20 mEq total) by mouth 2 (two) times daily.    Authorizing Provider: Minna Merritts    Ordering User: Britt Bottom

## 2015-07-28 DIAGNOSIS — Z79899 Other long term (current) drug therapy: Secondary | ICD-10-CM | POA: Diagnosis not present

## 2015-07-28 DIAGNOSIS — E032 Hypothyroidism due to medicaments and other exogenous substances: Secondary | ICD-10-CM | POA: Diagnosis not present

## 2015-08-04 ENCOUNTER — Other Ambulatory Visit: Payer: Self-pay

## 2015-08-04 MED ORDER — AMIODARONE HCL 200 MG PO TABS: 200.0000 mg | ORAL_TABLET | Freq: Every day | ORAL | Status: DC

## 2015-08-04 NOTE — Telephone Encounter (Signed)
Refill sent for Amiodarone 200 mg  

## 2015-08-08 DIAGNOSIS — M5136 Other intervertebral disc degeneration, lumbar region: Secondary | ICD-10-CM | POA: Diagnosis not present

## 2015-08-08 DIAGNOSIS — J449 Chronic obstructive pulmonary disease, unspecified: Secondary | ICD-10-CM | POA: Diagnosis not present

## 2015-08-08 DIAGNOSIS — Z96659 Presence of unspecified artificial knee joint: Secondary | ICD-10-CM | POA: Diagnosis not present

## 2015-08-08 DIAGNOSIS — R0689 Other abnormalities of breathing: Secondary | ICD-10-CM | POA: Diagnosis not present

## 2015-08-11 ENCOUNTER — Ambulatory Visit: Payer: Commercial Managed Care - HMO | Admitting: Cardiovascular Disease

## 2015-09-07 DIAGNOSIS — M5136 Other intervertebral disc degeneration, lumbar region: Secondary | ICD-10-CM | POA: Diagnosis not present

## 2015-09-07 DIAGNOSIS — Z96659 Presence of unspecified artificial knee joint: Secondary | ICD-10-CM | POA: Diagnosis not present

## 2015-09-07 DIAGNOSIS — J449 Chronic obstructive pulmonary disease, unspecified: Secondary | ICD-10-CM | POA: Diagnosis not present

## 2015-09-07 DIAGNOSIS — R0689 Other abnormalities of breathing: Secondary | ICD-10-CM | POA: Diagnosis not present

## 2015-09-15 DIAGNOSIS — Z79899 Other long term (current) drug therapy: Secondary | ICD-10-CM | POA: Diagnosis not present

## 2015-09-15 DIAGNOSIS — E032 Hypothyroidism due to medicaments and other exogenous substances: Secondary | ICD-10-CM | POA: Diagnosis not present

## 2015-10-08 DIAGNOSIS — J449 Chronic obstructive pulmonary disease, unspecified: Secondary | ICD-10-CM | POA: Diagnosis not present

## 2015-10-08 DIAGNOSIS — M5136 Other intervertebral disc degeneration, lumbar region: Secondary | ICD-10-CM | POA: Diagnosis not present

## 2015-10-08 DIAGNOSIS — Z96659 Presence of unspecified artificial knee joint: Secondary | ICD-10-CM | POA: Diagnosis not present

## 2015-10-08 DIAGNOSIS — R0689 Other abnormalities of breathing: Secondary | ICD-10-CM | POA: Diagnosis not present

## 2015-10-09 DIAGNOSIS — R4189 Other symptoms and signs involving cognitive functions and awareness: Secondary | ICD-10-CM | POA: Diagnosis not present

## 2015-10-09 DIAGNOSIS — G4733 Obstructive sleep apnea (adult) (pediatric): Secondary | ICD-10-CM | POA: Diagnosis not present

## 2015-10-09 DIAGNOSIS — G20 Parkinson's disease: Secondary | ICD-10-CM | POA: Diagnosis not present

## 2015-10-15 DIAGNOSIS — I509 Heart failure, unspecified: Secondary | ICD-10-CM | POA: Diagnosis not present

## 2015-10-15 DIAGNOSIS — R8290 Unspecified abnormal findings in urine: Secondary | ICD-10-CM | POA: Diagnosis not present

## 2015-10-15 DIAGNOSIS — R112 Nausea with vomiting, unspecified: Secondary | ICD-10-CM | POA: Diagnosis not present

## 2015-10-19 ENCOUNTER — Emergency Department: Payer: Commercial Managed Care - HMO

## 2015-10-19 ENCOUNTER — Encounter: Payer: Self-pay | Admitting: Medical Oncology

## 2015-10-19 ENCOUNTER — Emergency Department
Admission: EM | Admit: 2015-10-19 | Discharge: 2015-10-19 | Disposition: A | Discharge status: Home / Self Care | Payer: Commercial Managed Care - HMO | Attending: Emergency Medicine | Admitting: Emergency Medicine

## 2015-10-19 DIAGNOSIS — J45909 Unspecified asthma, uncomplicated: Secondary | ICD-10-CM | POA: Diagnosis not present

## 2015-10-19 DIAGNOSIS — K828 Other specified diseases of gallbladder: Secondary | ICD-10-CM | POA: Diagnosis not present

## 2015-10-19 DIAGNOSIS — K5900 Constipation, unspecified: Secondary | ICD-10-CM

## 2015-10-19 DIAGNOSIS — I5032 Chronic diastolic (congestive) heart failure: Secondary | ICD-10-CM | POA: Diagnosis not present

## 2015-10-19 DIAGNOSIS — I110 Hypertensive heart disease with heart failure: Secondary | ICD-10-CM | POA: Diagnosis not present

## 2015-10-19 DIAGNOSIS — J449 Chronic obstructive pulmonary disease, unspecified: Secondary | ICD-10-CM | POA: Insufficient documentation

## 2015-10-19 DIAGNOSIS — Z87891 Personal history of nicotine dependence: Secondary | ICD-10-CM | POA: Diagnosis not present

## 2015-10-19 DIAGNOSIS — R1011 Right upper quadrant pain: Secondary | ICD-10-CM

## 2015-10-19 DIAGNOSIS — I4891 Unspecified atrial fibrillation: Secondary | ICD-10-CM | POA: Insufficient documentation

## 2015-10-19 LAB — URINALYSIS COMPLETE WITH MICROSCOPIC (ARMC ONLY)
Bilirubin Urine: NEGATIVE
Glucose, UA: NEGATIVE mg/dL
Hgb urine dipstick: NEGATIVE
Nitrite: NEGATIVE
PH: 5 (ref 5.0–8.0)
PROTEIN: NEGATIVE mg/dL
Specific Gravity, Urine: 1.016 (ref 1.005–1.030)

## 2015-10-19 LAB — COMPREHENSIVE METABOLIC PANEL
ALK PHOS: 95 U/L (ref 38–126)
AST: 19 U/L (ref 15–41)
Albumin: 4.4 g/dL (ref 3.5–5.0)
Anion gap: 8 (ref 5–15)
BUN: 10 mg/dL (ref 6–20)
CHLORIDE: 102 mmol/L (ref 101–111)
CO2: 29 mmol/L (ref 22–32)
CREATININE: 0.71 mg/dL (ref 0.44–1.00)
Calcium: 9.5 mg/dL (ref 8.9–10.3)
GFR calc Af Amer: >60 mL/min (ref >60)
Glucose, Bld: 116 mg/dL — ABNORMAL HIGH (ref 65–99)
Potassium: 3.6 mmol/L (ref 3.5–5.1)
SODIUM: 139 mmol/L (ref 135–145)
Total Bilirubin: 0.4 mg/dL (ref 0.3–1.2)
Total Protein: 6.8 g/dL (ref 6.5–8.1)

## 2015-10-19 LAB — CBC WITH DIFFERENTIAL/PLATELET
BASOS ABS: 0.1 10*3/uL (ref 0–0.1)
Basophils Relative: 1 %
EOS PCT: 1 %
Eosinophils Absolute: 0.1 10*3/uL (ref 0–0.7)
HCT: 32.2 % — ABNORMAL LOW (ref 35.0–47.0)
HEMOGLOBIN: 11.6 g/dL — AB (ref 12.0–16.0)
Lymphocytes Relative: 17 %
Lymphs Abs: 1.1 10*3/uL (ref 1.0–3.6)
MCH: 32.5 pg (ref 26.0–34.0)
MCHC: 35.9 g/dL (ref 32.0–36.0)
MCV: 90.4 fL (ref 80.0–100.0)
Monocytes Absolute: 0.5 10*3/uL (ref 0.2–0.9)
Monocytes Relative: 7 %
NEUTROS PCT: 74 %
Neutro Abs: 5 10*3/uL (ref 1.4–6.5)
PLATELETS: 228 10*3/uL (ref 150–440)
RBC: 3.56 MIL/uL — AB (ref 3.80–5.20)
RDW: 14.1 % (ref 11.5–14.5)
WBC: 6.7 10*3/uL (ref 3.6–11.0)

## 2015-10-19 LAB — LIPASE, BLOOD: Lipase: 25 U/L (ref 11–51)

## 2015-10-19 MED ORDER — ONDANSETRON HCL 4 MG/2ML IJ SOLN
4.0000 mg | Freq: Once | INTRAMUSCULAR | Status: AC
  Administered 2015-10-19: 4 mg via INTRAVENOUS
  Filled 2015-10-19: qty 2

## 2015-10-19 MED ORDER — CEPHALEXIN 500 MG PO CAPS: 500.0000 mg | ORAL_CAPSULE | Freq: Three times a day (TID) | ORAL | 0 refills | Status: DC

## 2015-10-19 MED ORDER — IOPAMIDOL (ISOVUE-300) INJECTION 61%
100.0000 mL | Freq: Once | INTRAVENOUS | Status: AC | PRN
  Administered 2015-10-19: 100 mL via INTRAVENOUS

## 2015-10-19 MED ORDER — SORBITOL 70 % SOLN
960.0000 mL | TOPICAL_OIL | Freq: Once | ORAL | Status: DC
  Filled 2015-10-19: qty 240

## 2015-10-19 MED ORDER — FLEET ENEMA 7-19 GM/118ML RE ENEM
1.0000 | ENEMA | Freq: Once | RECTAL | Status: AC
  Administered 2015-10-19: 1 via RECTAL

## 2015-10-19 MED ORDER — MORPHINE SULFATE (PF) 2 MG/ML IV SOLN
2.0000 mg | Freq: Once | INTRAVENOUS | Status: AC
  Administered 2015-10-19: 2 mg via INTRAVENOUS
  Filled 2015-10-19: qty 1

## 2015-10-19 MED ORDER — IOPAMIDOL (ISOVUE-300) INJECTION 61%
30.0000 mL | Freq: Once | INTRAVENOUS | Status: AC | PRN
  Administered 2015-10-19: 30 mL via ORAL

## 2015-10-19 NOTE — ED Provider Notes (Signed)
Time Seen: Approximately towel for  I have reviewed the triage notes  Chief Complaint: Abdominal Pain   History of Present Illness: Hailey [name] is a 82 y.o. female *he states that she's had abdominal constipation now for the past week. Patient states she has feeling of bowel urgency but is not able to produce any stool. She states when she does have a bowel movement seems to be very watery. She denies any melena or hematochezia. She denies any nausea or vomiting. She's tried multiple laxatives over-the-counter without any successful relief and has had a history of constipation in the past.   Past Medical History:  Diagnosis Date  . Anemia    Chronic  . Anxiety   . Arthritis   . Asthma   . Atrophic vaginitis   . Cerebral aneurysm    Hx of  . CHF (congestive heart failure) ([place])   . COPD (chronic obstructive pulmonary disease) ([place])   . Diverticulitis   . DVT (deep vein thrombosis) in pregnancy 09/2008   a. LLE, recurrent hx, has IVC filter. Not on coumadin now with history of GI bleeding  . Fibrocystic disease of breast   . Fibromyalgia   . GERD (gastroesophageal reflux disease)   . Gout   . Gross hematuria   . Heart murmur   . History of colonoscopy   . Hyperlipidemia   . Hypertension   . Hypertrophic cardiomyopathy ([place])    a. Echo 4/09 with EF 70-75%, asymmetricy basal septal hypertrophy, mild MR without systolic anterior motion of the mitral valve, LVOT gradient to 130 mmHg with Valsalva b. Echo 7/13: EF 60-65%, mild focal basal septal hypertrophy, no significant LVOT gradient, no MV SAM c. Echo 2/15: EF 55-60%, HOCM, resting LVOT gradient 29 mmHg, Valsalva LVOT gradient > 140 mmHg, mild LVH, mild TR, elevated PASP  . Incomplete bladder emptying   . Migraine headache    Hx of  . Obesity   . OSA (obstructive sleep apnea)    a. Intolerant to CPAP, wears 2L via n/c  . Osteoarthritis    Hx of left TKR  . PAF (paroxysmal atrial fibrillation) ([place])    a. Full-dose ASA  alone 2/2 h/o GIB.  Marland Kitchen PAT (paroxysmal atrial tachycardia) ([place])   . 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 Pneumonia   . PUD (peptic ulcer disease)    With GI bleeding  . Pulmonary embolism ([place])   . Tremor     Patient Active Problem List   Diagnosis Date Noted  . Tremor 07/07/2015  . Respiratory failure ([place]) 05/19/2015  . Weakness 01/27/2015  . Abdomen enlarged 09/26/2013  . CTCL (cutaneous T-cell lymphoma) ([place]) 09/18/2013  . A-fib ([place]) 08/09/2013  . Anxiety 08/09/2013  . CCF (congestive cardiac failure) ([place]) 08/09/2013  . Chronic pain 08/09/2013  . PAF (paroxysmal atrial fibrillation) ([place])   . Chronic diastolic CHF (congestive heart failure) ([place]) 07/12/2013  . Benign essential HTN 07/01/2013  . Bilateral leg edema 06/06/2013  . Paroxysmal atrial fibrillation ([place]) 04/25/2013  . DOE (dyspnea on exertion) 10/11/2011  . COPD (chronic obstructive pulmonary disease) ([place]) 10/09/2011  . Cognitive decline 10/09/2011  . Behavior concern 08/23/2011  . Chest pain 10/26/2010  . Palpitations 10/26/2010  . PHLEBITIS AND THROMBOPHLEBITIS OF FEMORAL VEIN 10/13/2008  . DEEP VENOUS THROMBOPHLEBITIS, LEG, LEFT 10/09/2008  . GASTRIC ULCER, ACUTE 10/09/2008  . PERSONAL HX COLONIC POLYPS 09/02/2008  . FATIGUE 08/27/2008  . HOCM (hypertrophic obstructive cardiomyopathy) ([place]) 11/06/2007  .  HYPOTENSION, ORTHOSTATIC 11/06/2007  . ADENOMATOUS COLONIC POLYP 04/12/2007  . HIATAL HERNIA 04/12/2007  . Diverticulosis of colon (without mention of hemorrhage) 04/12/2007  . Hyperlipidemia 02/03/2007  . ANEMIA, CHRONIC 02/03/2007  . MIGRAINE HEADACHE 02/03/2007  . Essential hypertension 02/03/2007  . CEREBRAL ANEURYSM 02/03/2007  . GASTROESOPHAGEAL REFLUX DISEASE 02/03/2007  . OSTEOARTHRITIS 02/03/2007  . FIBROMYALGIA 02/03/2007  . CHRONIC FATIGUE SYNDROME 02/03/2007  . MITRAL VALVE PROLAPSE, HX OF 02/03/2007  . PULMONARY EMBOLISM, HX OF 02/03/2007  .  TOTAL KNEE REPLACEMENT, LEFT, HX OF 02/03/2007  . HYSTERECTOMY, HX OF 02/03/2007  . Other acquired absence of organ 02/03/2007  . INGUINAL HERNIORRHAPHY, RIGHT, HX OF 02/03/2007    Past Surgical History:  Procedure Laterality Date  . ABDOMINAL HYSTERECTOMY    . APPENDECTOMY    . BLADDER SUSPENSION    . CARDIAC CATHETERIZATION  2009   No significant CAD  . CARDIOVASCULAR STRESS TEST     a. Lexiscan Myoview 3/14: EF 76%, no evidence of ischemia or WMAs  . cataract surgery    . CEREBRAL ANEURYSM REPAIR  1998  . FOOT SURGERY     Bilateral  . INGUINAL HERNIA REPAIR  1968   Right  . KNEE ARTHROSCOPY  2009   Right  . SHOULDER SURGERY  1972   Right   Left 2012  . TONSILLECTOMY AND ADENOIDECTOMY    . TOTAL KNEE ARTHROPLASTY     Left    Past Surgical History:  Procedure Laterality Date  . ABDOMINAL HYSTERECTOMY    . APPENDECTOMY    . BLADDER SUSPENSION    . CARDIAC CATHETERIZATION  2009   No significant CAD  . CARDIOVASCULAR STRESS TEST     a. Lexiscan Myoview 3/14: EF 76%, no evidence of ischemia or WMAs  . cataract surgery    . CEREBRAL ANEURYSM REPAIR  1998  . FOOT SURGERY     Bilateral  . INGUINAL HERNIA REPAIR  1968   Right  . KNEE ARTHROSCOPY  2009   Right  . SHOULDER SURGERY  1972   Right   Left 2012  . TONSILLECTOMY AND ADENOIDECTOMY    . TOTAL KNEE ARTHROPLASTY     Left    Current Outpatient Rx  . Order #: [id number] Class: OTC  . Order #: [id number] Class: Normal  . Order #: [id number] Class: Historical Med  . Order #: [id number] Class: Normal  . Order #: [id number] Class: Print  . Order #: [id number] Class: Normal  . Order #: [id number] Class: Historical Med  . Order #: [id number] Class: Historical Med  . Order #: [id number] Class: Historical Med  . Order #: [id number] Class: Historical Med  . Order #: [id number] Class: Normal  . Order #: [id number] Class: Historical Med  . Order #: [id number] Class: Historical Med  . Order #: [id number] Class: Normal  . Order #:  [id number] Class: Historical Med  . Order #: [id number] Class: Normal  . Order #: [id number] Class: Historical Med  . Order #: [id number] Class: Fax  . Order #: [id number] Class: Print  . Order #: [id number] Class: Historical Med  . Order #: [id number] Class: Historical Med  . Order #: [id number] Class: Normal  . Order #: [id number] Class: Normal  . Order #: [id number] Class: Historical Med  . Order #: [id number] Class: Normal  . Order #: [id number] Class: Historical Med    Allergies:  Aspirin; Codeine; Ketorolac tromethamine; Nsaids; and Phenazopyridine hcl  Family History: Family History  Problem Relation Age of Onset  . Pancreatic cancer Mother   . Hypertension Mother     father  . Diabetes Mellitus II Mother  Sister  . Colon cancer Father   . Colon polyps Father   . Heart disease Father   . Stroke Father   . Heart disease Sister     More than 1 sister  . Colon cancer Brother   . Colon polyps Other     Siblings  . Ulcers Brother     Social History: Social History  Substance Use Topics  . Smoking status: Former Smoker    Packs/day: 1.00    Years: 60.00    Types: Cigarettes    Quit date: 12/15/2008  . Smokeless tobacco: Never Used     Comment: smoked for 50 years quit 5 + years  . Alcohol use 0.0 oz/week     Comment: Socially     Review of Systems:   10 point review of systems was performed and was otherwise negative:  Constitutional: No fever Eyes: No visual disturbances ENT: No sore throat, ear pain Cardiac: No chest pain Respiratory: No shortness of breath, wheezing, or stridor Abdomen: Abdominal pain is somewhat generalized and is up in the right upper quadrant. Endocrine: No weight loss, No night sweats Extremities: No peripheral edema, cyanosis Skin: No rashes, easy bruising Neurologic: No focal weakness, trouble with speech or swollowing Urologic: Patient describes some mild discomfort with urination and   Physical Exam:  ED Triage Vitals  Enc Vitals Group      BP 10/19/15 0947 (!) 138/47     Pulse Rate 10/19/15 0947 (!) 59     Resp 10/19/15 0947 18     Temp --      Temp src --      SpO2 10/19/15 0947 91 %     Weight 10/19/15 0948 185 lb (83.9 kg)     Height --      Head Circumference --      Peak Flow --      Pain Score 10/19/15 0948 8     Pain Loc --      Pain Edu? --      Excl. in [place]? --     General: Awake , Alert , and Oriented times 3; GCS 15 Head: Normal cephalic , atraumatic Eyes: Pupils equal , round, reactive to light Nose/Throat: No nasal drainage, patent upper airway without erythema or exudate.  Neck: Supple, Full range of motion, No anterior adenopathy or palpable thyroid masses Lungs: Clear to ascultation without wheezes , rhonchi, or rales Heart: Regular rate, regular rhythm without murmurs , gallops , or rubs Abdomen: Mild tenderness in the right upper quadrant without rebound, guarding, rigidity. Bowel sounds are positive and symmetric in all 4 quadrants. No palpable masses Extremities: 2 plus symmetric pulses. No edema, clubbing or cyanosis Neurologic: normal ambulation, Motor symmetric without deficits, sensory intact Skin: warm, dry, no rashes   Labs:   All laboratory work was reviewed including any pertinent negatives or positives listed below:  Labs Reviewed  CBC WITH DIFFERENTIAL/PLATELET - Abnormal; Notable for the following:       Result Value   RBC 3.56 (*)    Hemoglobin 11.6 (*)    HCT 32.2 (*)    All other components within normal limits  COMPREHENSIVE METABOLIC PANEL - Abnormal; Notable for the following:    Glucose, Bld 116 (*)    ALT <5 (*)    All other components within normal limits  URINALYSIS COMPLETEWITH MICROSCOPIC ([facility] ONLY) - Abnormal; Notable for the following:    Color, Urine YELLOW (*)    APPearance CLEAR (*)  Ketones, ur TRACE (*)    Leukocytes, UA 1+ (*)    Bacteria, UA RARE (*)    Squamous Epithelial / LPF 0-5 (*)    All other components within normal limits  URINE CULTURE   LIPASE, BLOOD  Review laboratory work shows findings consistent with urinary tract infection and the patient was ordered for urine culture   Radiology "Ct Abdomen Pelvis W Contrast  Result Date: 10/19/2015 CLINICAL DATA:  Complaint of abdominal pain and constipation. No bowel movement for 1 week. No rectal bleeding. EXAM: CT ABDOMEN AND PELVIS WITH CONTRAST TECHNIQUE: Multidetector CT imaging of the abdomen and pelvis was performed using the standard protocol following bolus administration of intravenous contrast. CONTRAST:  169mL ISOVUE-300 IOPAMIDOL (ISOVUE-300) INJECTION 61% COMPARISON:  07/31/2014 FINDINGS: Lower chest: Mild scarring identified at the lung bases. Atherosclerotic calcification noted of coronary arteries. There is dense calcification of the mitral annulus. Heart size is normal. No pericardial effusion. Hepatobiliary: Gallbladder is distended. Suspect gallbladder sludge. The liver is homogeneous without focal lesion. Pancreas: A 10 mm cyst is identified along the anterior body of the pancreas, stable in appearance over multiple prior studies. Spleen: Normal in appearance. Renal/Adrenal: Adrenal glands appear normal. There are numerous low-attenuation lesions within the kidneys, consistent with cysts and unchanged. No hydronephrosis or solid renal mass identified. The ureters are normal in appearance. Gastrointestinal tract: The stomach and small bowel loops are normal in appearance. Redundant colon contain numerous diverticula and moderate stool. No suspicious lesions or areas of inflammation. Status post appendectomy. Reproductive/Pelvis: Status post hysterectomy. Urinary bladder has a normal appearance. No adnexal mass. No free pelvic fluid. Vascular/Lymphatic: No retroperitoneal or mesenteric adenopathy. There is mild atherosclerotic calcification of the abdominal aorta. Inferior vena cava filter is in place, apex at L1. Musculoskeletal/Abdominal wall: There are degenerative changes in  the lumbar spine. Anterolisthesis of L4 on L5 measures 5 mm. There is a wedge compression fracture of T12 with approximately 25% loss of anterior height. Fracture appears unchanged since plain films from 01/13/2015. Other: none IMPRESSION: 1. Significant stool burden. Redundant colon with numerous diverticula. No evidence for acute diverticulitis. 2. Distended gallbladder. 3. Atherosclerosis of the coronary arteries. Mitral annulus calcification. 4. Stable pancreatic cyst. 5.  Aortic atherosclerosis. 6. IVC filter. 7. Stable appearance of T12 wedge compression fracture and spondylolisthesis at L4-5. Electronically Signed   By: Nolon Nations M.D.   On: 10/19/2015 13:07   US Abdomen Limited Ruq  Result Date: 10/19/2015 CLINICAL DATA:  Right upper quadrant abdominal pain for 1 month. EXAM: US ABDOMEN LIMITED - RIGHT UPPER QUADRANT COMPARISON:  CT of the abdomen and pelvis on 10/19/2015 FINDINGS: Gallbladder: Gallbladder is distended. Gallbladder wall is normal in thickness, 2 4 mm. No stones or pericholecystic fluid. Gallbladder sludge is present. Common bile duct: Diameter: 3.0 mm Liver: No focal lesion identified. Within normal limits in parenchymal echogenicity. IMPRESSION: Layering gallbladder sludge.  No evidence for acute  abnormality. Electronically Signed   By: Nolon Nations M.D.   On: 10/19/2015 16:34  "  I personally reviewed the radiologic studies   Procedures: Patient had a fleets enema given by the nursing staff with a small amount of output and some mild symptomatic improvement    ED Course:  Patient's stay here was uneventful and the patient otherwise remained hemodynamically stable. The patient's workup with a differential of diffuse abdominal pain included acute appendicitis, diverticulitis, acute cholecystitis, pancreatitis, etc. There does not appear to be any significant pathology seen on her abdominal pelvic CT  other than significant noted. Patient was given an enema here with a  small amount of results. She still feels constipated and had an order for another enema but declined. Patient was advised continue with her stool softener we discussed over-the-counter magnesium citrate or milk of magnesia. She was advised to contact her primary physician is changes in her bowel habits may be indicative of more significant pathology and she may require a gastroenterology evaluation. Patient appears to be of understanding she was advised to return here if she develops a fever, bloody stool, persistent vomiting or any other new concerns. Clinical Course     Assessment:  Constipation Urinary tract infection   Final Clinical Impression:  Final diagnoses:  Constipation, unspecified constipation type  Right upper quadrant abdominal pain     Plan:  Outpatient " New Prescriptions   CEPHALEXIN (KEFLEX) 500 MG CAPSULE    Take 1 capsule (500 mg total) by mouth 3 (three) times daily.  " Patient was advised to return immediately if condition worsens. Patient was advised to follow up with their primary care physician or other specialized physicians involved in their outpatient care. The patient and/or family member/power of attorney had laboratory results reviewed at the bedside. All questions and concerns were addressed and appropriate discharge instructions were distributed by the nursing staff.             Daymon Larsen, MD 10/19/15 [phone]

## 2015-10-19 NOTE — ED Notes (Signed)
Pt c/o lower abdominal pain 8/10 and rectal pain x7 days. Per pt stool has been watery but hasn't passed a bowel movement in over a week. Pt has taken OTC laxatives/stool softeners w/ no relief. Confirms Nausea/vomitting. Upon assessment bowel sounds active x4 quadrants, abdomen soft, tenderness noted generalized.  Pt AOx4, family at bedside.

## 2015-10-19 NOTE — ED Notes (Signed)
Pt defecated a handful worth; stated slight improvement

## 2015-10-19 NOTE — ED Notes (Signed)
Pharmacy notified of need for SMOG enema

## 2015-10-19 NOTE — ED Notes (Signed)
Pt made aware UA sample needed-cannot provide at this time. Will recheck.

## 2015-10-19 NOTE — ED Notes (Signed)
Pt up to bathroom without assistance at this time.

## 2015-10-19 NOTE — ED Notes (Signed)
Per pharmacy, the prescribed mixed enema did not have enough glycerin and pharmacy had to go and get glycerin from [facility]. Personnel is not back yet. MD notified of the delay; order changed to fleet enema in the meantime.

## 2015-10-19 NOTE — ED Triage Notes (Signed)
Pt to ed with c/o abd pain and constipation. Pt states she has not had a bm x 1 week.  Denies rectal bleeding.

## 2015-10-19 NOTE — Discharge Instructions (Signed)
Please try over-the-counter magnesium citrate and/or milk of magnesia. You can also get an over-the-counter enema such as a fleets enema. Please continue to drink plenty of fluids. Please contact your primary physician for further outpatient follow-up and possible referral to a gastroenterologist if symptoms don't improve.

## 2015-10-21 LAB — URINE CULTURE: Culture: NO GROWTH

## 2015-10-22 ENCOUNTER — Observation Stay: Payer: Commercial Managed Care - HMO

## 2015-10-22 ENCOUNTER — Encounter: Payer: Self-pay | Admitting: Emergency Medicine

## 2015-10-22 ENCOUNTER — Observation Stay
Admission: EM | Admit: 2015-10-22 | Discharge: 2015-10-24 | Disposition: A | Discharge status: Home / Self Care | Payer: Commercial Managed Care - HMO | Attending: Internal Medicine | Admitting: Internal Medicine

## 2015-10-22 DIAGNOSIS — Z9981 Dependence on supplemental oxygen: Secondary | ICD-10-CM | POA: Diagnosis not present

## 2015-10-22 DIAGNOSIS — E669 Obesity, unspecified: Secondary | ICD-10-CM | POA: Insufficient documentation

## 2015-10-22 DIAGNOSIS — Z86711 Personal history of pulmonary embolism: Secondary | ICD-10-CM | POA: Diagnosis not present

## 2015-10-22 DIAGNOSIS — Z823 Family history of stroke: Secondary | ICD-10-CM | POA: Insufficient documentation

## 2015-10-22 DIAGNOSIS — M797 Fibromyalgia: Secondary | ICD-10-CM | POA: Diagnosis not present

## 2015-10-22 DIAGNOSIS — J449 Chronic obstructive pulmonary disease, unspecified: Secondary | ICD-10-CM | POA: Diagnosis not present

## 2015-10-22 DIAGNOSIS — M109 Gout, unspecified: Secondary | ICD-10-CM | POA: Insufficient documentation

## 2015-10-22 DIAGNOSIS — R05 Cough: Secondary | ICD-10-CM

## 2015-10-22 DIAGNOSIS — N390 Urinary tract infection, site not specified: Secondary | ICD-10-CM | POA: Insufficient documentation

## 2015-10-22 DIAGNOSIS — Z8572 Personal history of non-Hodgkin lymphomas: Secondary | ICD-10-CM | POA: Insufficient documentation

## 2015-10-22 DIAGNOSIS — G20 Parkinson's disease: Secondary | ICD-10-CM | POA: Insufficient documentation

## 2015-10-22 DIAGNOSIS — I480 Paroxysmal atrial fibrillation: Secondary | ICD-10-CM | POA: Insufficient documentation

## 2015-10-22 DIAGNOSIS — E43 Unspecified severe protein-calorie malnutrition: Secondary | ICD-10-CM | POA: Insufficient documentation

## 2015-10-22 DIAGNOSIS — F419 Anxiety disorder, unspecified: Secondary | ICD-10-CM | POA: Insufficient documentation

## 2015-10-22 DIAGNOSIS — R1110 Vomiting, unspecified: Secondary | ICD-10-CM | POA: Diagnosis present

## 2015-10-22 DIAGNOSIS — Z79899 Other long term (current) drug therapy: Secondary | ICD-10-CM | POA: Diagnosis not present

## 2015-10-22 DIAGNOSIS — Z87891 Personal history of nicotine dependence: Secondary | ICD-10-CM | POA: Insufficient documentation

## 2015-10-22 DIAGNOSIS — R112 Nausea with vomiting, unspecified: Secondary | ICD-10-CM | POA: Diagnosis not present

## 2015-10-22 DIAGNOSIS — I422 Other hypertrophic cardiomyopathy: Secondary | ICD-10-CM | POA: Diagnosis not present

## 2015-10-22 DIAGNOSIS — R1011 Right upper quadrant pain: Secondary | ICD-10-CM

## 2015-10-22 DIAGNOSIS — K219 Gastro-esophageal reflux disease without esophagitis: Secondary | ICD-10-CM | POA: Diagnosis not present

## 2015-10-22 DIAGNOSIS — Z885 Allergy status to narcotic agent status: Secondary | ICD-10-CM | POA: Insufficient documentation

## 2015-10-22 DIAGNOSIS — G4733 Obstructive sleep apnea (adult) (pediatric): Secondary | ICD-10-CM | POA: Diagnosis not present

## 2015-10-22 DIAGNOSIS — Z8249 Family history of ischemic heart disease and other diseases of the circulatory system: Secondary | ICD-10-CM | POA: Insufficient documentation

## 2015-10-22 DIAGNOSIS — I110 Hypertensive heart disease with heart failure: Secondary | ICD-10-CM | POA: Insufficient documentation

## 2015-10-22 DIAGNOSIS — Z90710 Acquired absence of both cervix and uterus: Secondary | ICD-10-CM | POA: Insufficient documentation

## 2015-10-22 DIAGNOSIS — Z888 Allergy status to other drugs, medicaments and biological substances status: Secondary | ICD-10-CM | POA: Insufficient documentation

## 2015-10-22 DIAGNOSIS — Z7951 Long term (current) use of inhaled steroids: Secondary | ICD-10-CM | POA: Insufficient documentation

## 2015-10-22 DIAGNOSIS — M1990 Unspecified osteoarthritis, unspecified site: Secondary | ICD-10-CM | POA: Diagnosis not present

## 2015-10-22 DIAGNOSIS — I482 Chronic atrial fibrillation: Secondary | ICD-10-CM | POA: Diagnosis not present

## 2015-10-22 DIAGNOSIS — Z96652 Presence of left artificial knee joint: Secondary | ICD-10-CM | POA: Insufficient documentation

## 2015-10-22 DIAGNOSIS — E785 Hyperlipidemia, unspecified: Secondary | ICD-10-CM | POA: Diagnosis not present

## 2015-10-22 DIAGNOSIS — Z86718 Personal history of other venous thrombosis and embolism: Secondary | ICD-10-CM | POA: Insufficient documentation

## 2015-10-22 DIAGNOSIS — R059 Cough, unspecified: Secondary | ICD-10-CM

## 2015-10-22 DIAGNOSIS — Z800 Family history of malignant neoplasm of digestive organs: Secondary | ICD-10-CM | POA: Insufficient documentation

## 2015-10-22 DIAGNOSIS — Z833 Family history of diabetes mellitus: Secondary | ICD-10-CM | POA: Insufficient documentation

## 2015-10-22 DIAGNOSIS — Z886 Allergy status to analgesic agent status: Secondary | ICD-10-CM | POA: Insufficient documentation

## 2015-10-22 DIAGNOSIS — K5909 Other constipation: Secondary | ICD-10-CM | POA: Insufficient documentation

## 2015-10-22 DIAGNOSIS — G629 Polyneuropathy, unspecified: Secondary | ICD-10-CM | POA: Insufficient documentation

## 2015-10-22 DIAGNOSIS — I509 Heart failure, unspecified: Secondary | ICD-10-CM | POA: Insufficient documentation

## 2015-10-22 DIAGNOSIS — R634 Abnormal weight loss: Secondary | ICD-10-CM | POA: Diagnosis not present

## 2015-10-22 DIAGNOSIS — Z9889 Other specified postprocedural states: Secondary | ICD-10-CM | POA: Insufficient documentation

## 2015-10-22 DIAGNOSIS — Z8711 Personal history of peptic ulcer disease: Secondary | ICD-10-CM | POA: Diagnosis not present

## 2015-10-22 DIAGNOSIS — R109 Unspecified abdominal pain: Secondary | ICD-10-CM

## 2015-10-22 LAB — CBC WITH DIFFERENTIAL/PLATELET
BASOS ABS: 0 10*3/uL (ref 0–0.1)
BASOS PCT: 0 %
EOS ABS: 0 10*3/uL (ref 0–0.7)
Eosinophils Relative: 1 %
HCT: 30.8 % — ABNORMAL LOW (ref 35.0–47.0)
HEMOGLOBIN: 11.1 g/dL — AB (ref 12.0–16.0)
LYMPHS ABS: 0.9 10*3/uL — AB (ref 1.0–3.6)
Lymphocytes Relative: 17 %
MCH: 32 pg (ref 26.0–34.0)
MCHC: 36.1 g/dL — ABNORMAL HIGH (ref 32.0–36.0)
MCV: 88.6 fL (ref 80.0–100.0)
Monocytes Absolute: 0.5 10*3/uL (ref 0.2–0.9)
Monocytes Relative: 9 %
NEUTROS PCT: 73 %
Neutro Abs: 3.9 10*3/uL (ref 1.4–6.5)
Platelets: 206 10*3/uL (ref 150–440)
RBC: 3.47 MIL/uL — AB (ref 3.80–5.20)
RDW: 14.6 % — ABNORMAL HIGH (ref 11.5–14.5)
WBC: 5.3 10*3/uL (ref 3.6–11.0)

## 2015-10-22 LAB — URINALYSIS COMPLETE WITH MICROSCOPIC (ARMC ONLY)
Bilirubin Urine: NEGATIVE
GLUCOSE, UA: NEGATIVE mg/dL
HGB URINE DIPSTICK: NEGATIVE
NITRITE: NEGATIVE
PROTEIN: NEGATIVE mg/dL
Specific Gravity, Urine: 1.009 (ref 1.005–1.030)
pH: 5 (ref 5.0–8.0)

## 2015-10-22 LAB — COMPREHENSIVE METABOLIC PANEL
ALT: <5 U/L — ABNORMAL LOW (ref 14–54)
ANION GAP: 10 (ref 5–15)
AST: 35 U/L (ref 15–41)
Albumin: 4.5 g/dL (ref 3.5–5.0)
Alkaline Phosphatase: 94 U/L (ref 38–126)
BILIRUBIN TOTAL: 0.2 mg/dL — AB (ref 0.3–1.2)
BUN: 11 mg/dL (ref 6–20)
CALCIUM: 9.6 mg/dL (ref 8.9–10.3)
CO2: 28 mmol/L (ref 22–32)
CREATININE: 0.74 mg/dL (ref 0.44–1.00)
Chloride: 101 mmol/L (ref 101–111)
GFR calc Af Amer: >60 mL/min (ref >60)
GFR calc non Af Amer: >60 mL/min (ref >60)
GLUCOSE: 131 mg/dL — AB (ref 65–99)
Potassium: 3.3 mmol/L — ABNORMAL LOW (ref 3.5–5.1)
Sodium: 139 mmol/L (ref 135–145)
Total Protein: 7.3 g/dL (ref 6.5–8.1)

## 2015-10-22 LAB — LIPASE, BLOOD: Lipase: 20 U/L (ref 11–51)

## 2015-10-22 MED ORDER — CEPHALEXIN 500 MG PO CAPS
500.0000 mg | ORAL_CAPSULE | Freq: Three times a day (TID) | ORAL | Status: DC
  Administered 2015-10-22 – 2015-10-23 (×2): 500 mg via ORAL
  Filled 2015-10-22 (×3): qty 1

## 2015-10-22 MED ORDER — ONDANSETRON HCL 4 MG/2ML IJ SOLN
4.0000 mg | Freq: Once | INTRAMUSCULAR | Status: AC
  Administered 2015-10-22: 4 mg via INTRAVENOUS
  Filled 2015-10-22: qty 2

## 2015-10-22 MED ORDER — SUCRALFATE 1 G PO TABS
1.0000 g | ORAL_TABLET | Freq: Three times a day (TID) | ORAL | Status: DC
  Administered 2015-10-22 – 2015-10-24 (×7): 1 g via ORAL
  Filled 2015-10-22 (×7): qty 1

## 2015-10-22 MED ORDER — DOCUSATE SODIUM 100 MG PO CAPS: 100.0000 mg | ORAL_CAPSULE | Freq: Two times a day (BID) | ORAL | Status: DC | PRN

## 2015-10-22 MED ORDER — GABAPENTIN 400 MG PO CAPS
400.0000 mg | ORAL_CAPSULE | Freq: Every day | ORAL | Status: DC
  Administered 2015-10-22 – 2015-10-23 (×2): 400 mg via ORAL
  Filled 2015-10-22 (×3): qty 1

## 2015-10-22 MED ORDER — CARBIDOPA-LEVODOPA ER 25-100 MG PO TBCR
1.0000 | EXTENDED_RELEASE_TABLET | Freq: Two times a day (BID) | ORAL | Status: DC
  Administered 2015-10-22 – 2015-10-24 (×4): 1 via ORAL
  Filled 2015-10-22 (×5): qty 1

## 2015-10-22 MED ORDER — DULOXETINE HCL 60 MG PO CPEP
60.0000 mg | ORAL_CAPSULE | Freq: Every day | ORAL | Status: DC
  Administered 2015-10-23 – 2015-10-24 (×2): 60 mg via ORAL
  Filled 2015-10-22 (×2): qty 1

## 2015-10-22 MED ORDER — ENSURE ENLIVE PO LIQD
237.0000 mL | Freq: Two times a day (BID) | ORAL | Status: DC
  Administered 2015-10-23 (×2): 237 mL via ORAL

## 2015-10-22 MED ORDER — MONTELUKAST SODIUM 10 MG PO TABS
10.0000 mg | ORAL_TABLET | Freq: Every day | ORAL | Status: DC
  Administered 2015-10-22 – 2015-10-23 (×2): 10 mg via ORAL
  Filled 2015-10-22 (×2): qty 1

## 2015-10-22 MED ORDER — ALENDRONATE SODIUM 70 MG PO TABS: 70.0000 mg | ORAL_TABLET | ORAL | Status: DC

## 2015-10-22 MED ORDER — GABAPENTIN 100 MG PO CAPS
300.0000 mg | ORAL_CAPSULE | Freq: Every morning | ORAL | Status: DC
  Administered 2015-10-23: 300 mg via ORAL
  Filled 2015-10-22: qty 3

## 2015-10-22 MED ORDER — PRAVASTATIN SODIUM 40 MG PO TABS
40.0000 mg | ORAL_TABLET | Freq: Every day | ORAL | Status: DC
  Administered 2015-10-22 – 2015-10-24 (×3): 40 mg via ORAL
  Filled 2015-10-22 (×4): qty 1

## 2015-10-22 MED ORDER — LOSARTAN POTASSIUM 50 MG PO TABS
25.0000 mg | ORAL_TABLET | Freq: Every day | ORAL | Status: DC
  Administered 2015-10-23: 25 mg via ORAL
  Filled 2015-10-22: qty 1

## 2015-10-22 MED ORDER — MORPHINE SULFATE (PF) 4 MG/ML IV SOLN
4.0000 mg | Freq: Once | INTRAVENOUS | Status: AC
  Administered 2015-10-22: 4 mg via INTRAVENOUS
  Filled 2015-10-22: qty 1

## 2015-10-22 MED ORDER — OXYCODONE HCL 5 MG PO TABS
5.0000 mg | ORAL_TABLET | Freq: Four times a day (QID) | ORAL | Status: DC | PRN
Start: 2015-10-22 — End: 2015-10-24
  Administered 2015-10-23 – 2015-10-24 (×2): 5 mg via ORAL
  Filled 2015-10-22 (×2): qty 1

## 2015-10-22 MED ORDER — MOMETASONE FURO-FORMOTEROL FUM 100-5 MCG/ACT IN AERO
2.0000 | INHALATION_SPRAY | Freq: Two times a day (BID) | RESPIRATORY_TRACT | Status: DC
  Administered 2015-10-22 – 2015-10-24 (×4): 2 via RESPIRATORY_TRACT
  Filled 2015-10-22: qty 8.8

## 2015-10-22 MED ORDER — SODIUM CHLORIDE 0.9 % IV SOLN
1000.0000 mL | Freq: Once | INTRAVENOUS | Status: AC
  Administered 2015-10-22: 1000 mL via INTRAVENOUS

## 2015-10-22 MED ORDER — METOCLOPRAMIDE HCL 5 MG/ML IJ SOLN
10.0000 mg | Freq: Once | INTRAMUSCULAR | Status: AC
  Administered 2015-10-22: 10 mg via INTRAVENOUS
  Filled 2015-10-22: qty 2

## 2015-10-22 MED ORDER — SODIUM CHLORIDE 0.9 % IV SOLN
INTRAVENOUS | Status: DC
  Administered 2015-10-22: 22:00:00 via INTRAVENOUS

## 2015-10-22 MED ORDER — POTASSIUM CHLORIDE CRYS ER 20 MEQ PO TBCR
20.0000 meq | EXTENDED_RELEASE_TABLET | Freq: Two times a day (BID) | ORAL | Status: DC
  Administered 2015-10-22 – 2015-10-23 (×2): 20 meq via ORAL
  Filled 2015-10-22 (×3): qty 1

## 2015-10-22 MED ORDER — ACETAMINOPHEN 325 MG PO TABS
650.0000 mg | ORAL_TABLET | Freq: Four times a day (QID) | ORAL | Status: DC | PRN
  Administered 2015-10-22: 650 mg via ORAL
  Filled 2015-10-22: qty 2

## 2015-10-22 MED ORDER — DULOXETINE HCL 30 MG PO CPEP
30.0000 mg | ORAL_CAPSULE | Freq: Every day | ORAL | Status: DC
  Administered 2015-10-23 – 2015-10-24 (×2): 30 mg via ORAL
  Filled 2015-10-22 (×2): qty 1

## 2015-10-22 MED ORDER — GABAPENTIN 100 MG PO CAPS
200.0000 mg | ORAL_CAPSULE | Freq: Every evening | ORAL | Status: DC
  Administered 2015-10-23 – 2015-10-24 (×2): 200 mg via ORAL
  Filled 2015-10-22 (×2): qty 2

## 2015-10-22 MED ORDER — PANTOPRAZOLE SODIUM 40 MG PO TBEC
40.0000 mg | DELAYED_RELEASE_TABLET | Freq: Every day | ORAL | Status: DC
  Administered 2015-10-23 – 2015-10-24 (×2): 40 mg via ORAL
  Filled 2015-10-22 (×2): qty 1

## 2015-10-22 MED ORDER — ONDANSETRON HCL 4 MG/2ML IJ SOLN: 4.0000 mg | Freq: Four times a day (QID) | INTRAMUSCULAR | Status: DC | PRN

## 2015-10-22 MED ORDER — ORAL CARE MOUTH RINSE: 15.0000 mL | Freq: Two times a day (BID) | OROMUCOSAL | Status: DC

## 2015-10-22 MED ORDER — ONDANSETRON 4 MG PO TBDP
4.0000 mg | ORAL_TABLET | Freq: Once | ORAL | Status: AC
  Administered 2015-10-22: 4 mg via ORAL
  Filled 2015-10-22: qty 1

## 2015-10-22 MED ORDER — FAMOTIDINE IN NACL 20-0.9 MG/50ML-% IV SOLN
20.0000 mg | Freq: Once | INTRAVENOUS | Status: AC
  Administered 2015-10-22: 20 mg via INTRAVENOUS
  Filled 2015-10-22: qty 50

## 2015-10-22 MED ORDER — HEPARIN SODIUM (PORCINE) 5000 UNIT/ML IJ SOLN
5000.0000 [IU] | Freq: Three times a day (TID) | INTRAMUSCULAR | Status: DC
Start: 2015-10-22 — End: 2015-10-24
  Administered 2015-10-22 – 2015-10-24 (×6): 5000 [IU] via SUBCUTANEOUS
  Filled 2015-10-22 (×7): qty 1

## 2015-10-22 MED ORDER — TIOTROPIUM BROMIDE MONOHYDRATE 18 MCG IN CAPS
18.0000 ug | ORAL_CAPSULE | Freq: Every day | RESPIRATORY_TRACT | Status: DC
  Administered 2015-10-23 – 2015-10-24 (×2): 18 ug via RESPIRATORY_TRACT
  Filled 2015-10-22: qty 5

## 2015-10-22 MED ORDER — SODIUM CHLORIDE 0.9 % IV SOLN
Freq: Once | INTRAVENOUS | Status: AC
  Administered 2015-10-22: 19:00:00 via INTRAVENOUS

## 2015-10-22 MED ORDER — ALBUTEROL SULFATE (2.5 MG/3ML) 0.083% IN NEBU
2.5000 mg | INHALATION_SOLUTION | RESPIRATORY_TRACT | Status: DC | PRN
Start: 2015-10-22 — End: 2015-10-24

## 2015-10-22 MED ORDER — POTASSIUM CHLORIDE CRYS ER 20 MEQ PO TBCR
20.0000 meq | EXTENDED_RELEASE_TABLET | Freq: Two times a day (BID) | ORAL | Status: DC
  Administered 2015-10-22 – 2015-10-23 (×2): 20 meq via ORAL
  Filled 2015-10-22 (×2): qty 1

## 2015-10-22 MED ORDER — AMIODARONE HCL 200 MG PO TABS
200.0000 mg | ORAL_TABLET | Freq: Every day | ORAL | Status: DC
  Administered 2015-10-23 – 2015-10-24 (×2): 200 mg via ORAL
  Filled 2015-10-22 (×2): qty 1

## 2015-10-22 MED ORDER — FLUTICASONE PROPIONATE 50 MCG/ACT NA SUSP
2.0000 | Freq: Every day | NASAL | Status: DC | PRN
  Administered 2015-10-23: 2 via NASAL
  Filled 2015-10-22: qty 16

## 2015-10-22 NOTE — Progress Notes (Signed)

## 2015-10-22 NOTE — ED Provider Notes (Signed)
[facility] Emergency Department Provider Note        Time seen: ----------------------------------------- 3:14 PM on 10/22/2015 -----------------------------------------    I have reviewed the triage vital signs and the nursing notes.   HISTORY  Chief Complaint Nausea    HPI Hailey [name] is a 82 y.o. female who presents ER for persistent nausea and vomiting. Patient states she was seen in the ER recently and did not improve once she returned home.She presents from [facility]. She has been taking oral medications for nausea claims are not helping. She is also concerned may be her gallbladder. She complains of diffuse abdominal pain but worse in the right side. Food seems to make her symptoms worse, she denies fever but has had chills.   Past Medical History:  Diagnosis Date  . Anemia    Chronic  . Anxiety   . Arthritis   . Asthma   . Atrophic vaginitis   . Cerebral aneurysm    Hx of  . CHF (congestive heart failure) ([place])   . COPD (chronic obstructive pulmonary disease) ([place])   . Diverticulitis   . DVT (deep vein thrombosis) in pregnancy 09/2008   a. LLE, recurrent hx, has IVC filter. Not on coumadin now with history of GI bleeding  . Fibrocystic disease of breast   . Fibromyalgia   . GERD (gastroesophageal reflux disease)   . Gout   . Gross hematuria   . Heart murmur   . History of colonoscopy   . Hyperlipidemia   . Hypertension   . Hypertrophic cardiomyopathy ([place])    a. Echo 4/09 with EF 70-75%, asymmetricy basal septal hypertrophy, mild MR without systolic anterior motion of the mitral valve, LVOT gradient to 130 mmHg with Valsalva b. Echo 7/13: EF 60-65%, mild focal basal septal hypertrophy, no significant LVOT gradient, no MV SAM c. Echo 2/15: EF 55-60%, HOCM, resting LVOT gradient 29 mmHg, Valsalva LVOT gradient > 140 mmHg, mild LVH, mild TR, elevated PASP  . Incomplete bladder emptying   . Migraine headache    Hx of   . Obesity   . OSA (obstructive sleep apnea)    a. Intolerant to CPAP, wears 2L via n/c  . Osteoarthritis    Hx of left TKR  . PAF (paroxysmal atrial fibrillation) ([place])    a. Full-dose ASA alone 2/2 h/o GIB.  Marland Kitchen PAT (paroxysmal atrial tachycardia) ([place])   . 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 Pneumonia   . PUD (peptic ulcer disease)    With GI bleeding  . Pulmonary embolism ([place])   . Tremor     Patient Active Problem List   Diagnosis Date Noted  . Tremor 07/07/2015  . Respiratory failure ([place]) 05/19/2015  . Weakness 01/27/2015  . Abdomen enlarged 09/26/2013  . CTCL (cutaneous T-cell lymphoma) ([place]) 09/18/2013  . A-fib ([place]) 08/09/2013  . Anxiety 08/09/2013  . CCF (congestive cardiac failure) ([place]) 08/09/2013  . Chronic pain 08/09/2013  . PAF (paroxysmal atrial fibrillation) ([place])   . Chronic diastolic CHF (congestive heart failure) ([place]) 07/12/2013  . Benign essential HTN 07/01/2013  . Bilateral leg edema 06/06/2013  . Paroxysmal atrial fibrillation ([place]) 04/25/2013  . DOE (dyspnea on exertion) 10/11/2011  . COPD (chronic obstructive pulmonary disease) ([place]) 10/09/2011  . Cognitive decline 10/09/2011  . Behavior concern 08/23/2011  . Chest pain 10/26/2010  . Palpitations 10/26/2010  . PHLEBITIS AND THROMBOPHLEBITIS OF FEMORAL VEIN 10/13/2008  . DEEP VENOUS THROMBOPHLEBITIS,  LEG, LEFT 10/09/2008  . GASTRIC ULCER, ACUTE 10/09/2008  . PERSONAL HX COLONIC POLYPS 09/02/2008  . FATIGUE 08/27/2008  . HOCM (hypertrophic obstructive cardiomyopathy) ([place]) 11/06/2007  . HYPOTENSION, ORTHOSTATIC 11/06/2007  . ADENOMATOUS COLONIC POLYP 04/12/2007  . HIATAL HERNIA 04/12/2007  . Diverticulosis of colon (without mention of hemorrhage) 04/12/2007  . Hyperlipidemia 02/03/2007  . ANEMIA, CHRONIC 02/03/2007  . MIGRAINE HEADACHE 02/03/2007  . Essential hypertension 02/03/2007  . CEREBRAL ANEURYSM 02/03/2007  . GASTROESOPHAGEAL REFLUX DISEASE  02/03/2007  . OSTEOARTHRITIS 02/03/2007  . FIBROMYALGIA 02/03/2007  . CHRONIC FATIGUE SYNDROME 02/03/2007  . MITRAL VALVE PROLAPSE, HX OF 02/03/2007  . PULMONARY EMBOLISM, HX OF 02/03/2007  . TOTAL KNEE REPLACEMENT, LEFT, HX OF 02/03/2007  . HYSTERECTOMY, HX OF 02/03/2007  . Other acquired absence of organ 02/03/2007  . INGUINAL HERNIORRHAPHY, RIGHT, HX OF 02/03/2007    Past Surgical History:  Procedure Laterality Date  . ABDOMINAL HYSTERECTOMY    . APPENDECTOMY    . BLADDER SUSPENSION    . CARDIAC CATHETERIZATION  2009   No significant CAD  . CARDIOVASCULAR STRESS TEST     a. Lexiscan Myoview 3/14: EF 76%, no evidence of ischemia or WMAs  . cataract surgery    . CEREBRAL ANEURYSM REPAIR  1998  . FOOT SURGERY     Bilateral  . INGUINAL HERNIA REPAIR  1968   Right  . KNEE ARTHROSCOPY  2009   Right  . SHOULDER SURGERY  1972   Right   Left 2012  . TONSILLECTOMY AND ADENOIDECTOMY    . TOTAL KNEE ARTHROPLASTY     Left    Allergies Aspirin; Codeine; Ketorolac tromethamine; Nsaids; and Phenazopyridine hcl  Social History Social History  Substance Use Topics  . Smoking status: Former Smoker    Packs/day: 1.00    Years: 60.00    Types: Cigarettes    Quit date: 12/15/2008  . Smokeless tobacco: Never Used     Comment: smoked for 50 years quit 5 + years  . Alcohol use 0.0 oz/week     Comment: Socially    Review of Systems Constitutional: Negative for fever.Positive for chills Cardiovascular: Negative for chest pain. Respiratory: Negative for shortness of breath. Gastrointestinal: Positive for abdominal pain, vomiting Genitourinary: Negative for dysuria. Musculoskeletal: Negative for back pain. Skin: Negative for rash. Neurological: Negative for headaches, focal weakness or numbness.  10-point ROS otherwise negative.  ____________________________________________   PHYSICAL EXAM:  VITAL SIGNS: ED Triage Vitals  Enc Vitals Group     BP 10/22/15 1510 (!) 146/88      Pulse Rate 10/22/15 1510 62     Resp 10/22/15 1510 18     Temp 10/22/15 1510 98.2 F (36.8 C)     Temp Source 10/22/15 1510 Oral     SpO2 10/22/15 1510 93 %     Weight 10/22/15 1512 168 lb (76.2 kg)     Height 10/22/15 1512 5\' 1"  (1.549 m)     Head Circumference --      Peak Flow --      Pain Score 10/22/15 1512 7     Pain Loc --      Pain Edu? --      Excl. in [place]? --     Constitutional: Alert and oriented. Mild distress Eyes: Conjunctivae are normal. PERRL. Normal extraocular movements. ENT   Head: Normocephalic and atraumatic.   Nose: No congestion/rhinnorhea.   Mouth/Throat: Mucous membranes are Dry   Neck: No stridor. Cardiovascular: Normal rate, regular rhythm. No  murmurs, rubs, or gallops. Respiratory: Normal respiratory effort without tachypnea nor retractions. Breath sounds are clear and equal bilaterally. No wheezes/rales/rhonchi. Gastrointestinal: Nonfocal tenderness, normal bowel sounds. Musculoskeletal: Nontender with normal range of motion in all extremities. No lower extremity tenderness nor edema. Neurologic:  Normal speech and language. No gross focal neurologic deficits are appreciated.  Skin:  Skin is warm, dry and intact. No rash noted. Psychiatric: Mood and affect are normal. Speech and behavior are normal.  ____________________________________________  EKG: Interpreted by me. Sinus rhythm rate of 61 bpm, normal PR interval, normal QRS size, long QT, baseline artifact, normal axis, nonspecific ST and T-wave changes  ____________________________________________  ED COURSE:  Pertinent labs & imaging results that were available during my care of the patient were reviewed by me and considered in my medical decision making (see chart for details). Clinical Course  Patient presents to ER with abdominal pain and vomiting. We will assess with labs and imaging if necessary.  Procedures ____________________________________________   LABS  (pertinent positives/negatives)  Labs Reviewed  COMPREHENSIVE METABOLIC PANEL - Abnormal; Notable for the following:       Result Value   Potassium 3.3 (*)    Glucose, Bld 131 (*)    ALT <5 (*)    Total Bilirubin 0.2 (*)    All other components within normal limits  CBC WITH DIFFERENTIAL/PLATELET - Abnormal; Notable for the following:    RBC 3.47 (*)    Hemoglobin 11.1 (*)    HCT 30.8 (*)    MCHC 36.1 (*)    RDW 14.6 (*)    Lymphs Abs 0.9 (*)    All other components within normal limits  LIPASE, BLOOD  CBC WITH DIFFERENTIAL/PLATELET  URINALYSIS COMPLETEWITH MICROSCOPIC ([facility] ONLY)   ____________________________________________  FINAL ASSESSMENT AND PLAN  Abdominal pain, vomiting  Plan: Patient with labs and imaging as dictated above. Patient essentially with intractable vomiting of uncertain etiology. This could be secondary to gallbladder sludge visualized on recent ultrasound. She has received multiple doses of antiemetics without significant improvement. I will discuss with the hospitalist for admission.   Earleen Newport, MD   Note: This dictation was prepared with Dragon dictation. Any transcriptional errors that result from this process are unintentional    Earleen Newport, MD 10/22/15 [phone]

## 2015-10-22 NOTE — ED Notes (Signed)
Lab needed CBC reordered due to hematolysis and needed purple top redrawn

## 2015-10-22 NOTE — ED Triage Notes (Signed)
Pt brought by EMS for nausea and vomiting from [facility]. Was seen here Monday for same thing and was given meds to go home with but claims they are not helping.

## 2015-10-22 NOTE — H&P (Signed)
[facility] [place] NAME: Hailey [name]    MR#:  [id number]  DATE OF BIRTH:  1932/10/01  DATE OF ADMISSION:  10/22/2015  PRIMARY CARE PHYSICIAN: BABAOFF, Caryl Bis, MD   REQUESTING/REFERRING PHYSICIAN: [place]  CHIEF COMPLAINT:   Chief Complaint  Patient presents with  . Nausea    HISTORY OF PRESENT ILLNESS: Hailey [name]  is a 82 y.o. female with a known history of Anemia, asthma, cerebral aneurysm, CHF, COPD, diverticulitis, DVT, fibromyalgia, hypertrophic cardiomyopathy, ulcerative sleep apnea, peptic ulcer disease and GI bleed- for last 2-3 weeks has been having nausea and vomiting every day with all type of food without any difficulty in swallowing the food. She also have mild upper abdominal and right-sided pain which is constant. She consulted Royce Macadamia primary care physician who prescribed some nausea medication but it did not help much so she came to emergency room 2 days ago when after having noncontributory CT scan of the abdomen and ultrasound abdomen and her urinalysis was mildly positive so she was sent home with oral antibiotic for UTI. She did not feel any better and continued to have this vomiting episode she said that she lost almost 20 pound weight in last 2-3 weeks and she never had any symptoms of UTI so she don't feel that she had that UTI when she came to 3 days ago.  As she cannot tolerate or oral diet and she is requiring multiple medications to control her nausea and vomiting she is given his admission by ER physician.  PAST MEDICAL HISTORY:   Past Medical History:  Diagnosis Date  . Anemia    Chronic  . Anxiety   . Arthritis   . Asthma   . Atrophic vaginitis   . Cerebral aneurysm    Hx of  . CHF (congestive heart failure) ([place])   . COPD (chronic obstructive pulmonary disease) ([place])   . Diverticulitis   . DVT (deep vein thrombosis) in pregnancy 09/2008   a. LLE, recurrent hx, has IVC filter. Not on coumadin now with history of  GI bleeding  . Fibrocystic disease of breast   . Fibromyalgia   . GERD (gastroesophageal reflux disease)   . Gout   . Gross hematuria   . Heart murmur   . History of colonoscopy   . Hyperlipidemia   . Hypertension   . Hypertrophic cardiomyopathy ([place])    a. Echo 4/09 with EF 70-75%, asymmetricy basal septal hypertrophy, mild MR without systolic anterior motion of the mitral valve, LVOT gradient to 130 mmHg with Valsalva b. Echo 7/13: EF 60-65%, mild focal basal septal hypertrophy, no significant LVOT gradient, no MV SAM c. Echo 2/15: EF 55-60%, HOCM, resting LVOT gradient 29 mmHg, Valsalva LVOT gradient > 140 mmHg, mild LVH, mild TR, elevated PASP  . Incomplete bladder emptying   . Migraine headache    Hx of  . Obesity   . OSA (obstructive sleep apnea)    a. Intolerant to CPAP, wears 2L via n/c  . Osteoarthritis    Hx of left TKR  . PAF (paroxysmal atrial fibrillation) ([place])    a. Full-dose ASA alone 2/2 h/o GIB.  Marland Kitchen PAT (paroxysmal atrial tachycardia) ([place])   . 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 Pneumonia   . PUD (peptic ulcer disease)    With GI bleeding  . Pulmonary embolism ([place])   . Tremor     PAST SURGICAL HISTORY: Past Surgical History:  Procedure Laterality Date  . ABDOMINAL HYSTERECTOMY    . APPENDECTOMY    . BLADDER SUSPENSION    . CARDIAC CATHETERIZATION  2009   No significant CAD  . CARDIOVASCULAR STRESS TEST     a. Lexiscan Myoview 3/14: EF 76%, no evidence of ischemia or WMAs  . cataract surgery    . CEREBRAL ANEURYSM REPAIR  1998  . FOOT SURGERY     Bilateral  . INGUINAL HERNIA REPAIR  1968   Right  . KNEE ARTHROSCOPY  2009   Right  . SHOULDER SURGERY  1972   Right   Left 2012  . TONSILLECTOMY AND ADENOIDECTOMY    . TOTAL KNEE ARTHROPLASTY     Left    SOCIAL HISTORY:  Social History  Substance Use Topics  . Smoking status: Former Smoker    Packs/day: 1.00    Years: 60.00    Types: Cigarettes    Quit  date: 12/15/2008  . Smokeless tobacco: Never Used     Comment: smoked for 50 years quit 5 + years  . Alcohol use 0.0 oz/week     Comment: Socially    FAMILY HISTORY:  Family History  Problem Relation Age of Onset  . Pancreatic cancer Mother   . Hypertension Mother     father  . Diabetes Mellitus II Mother     Sister  . Colon cancer Father   . Colon polyps Father   . Heart disease Father   . Stroke Father   . Heart disease Sister     More than 1 sister  . Colon cancer Brother   . Colon polyps Other     Siblings  . Ulcers Brother     DRUG ALLERGIES:  Allergies  Allergen Reactions  . Aspirin Other (See Comments)    Reaction:  GI bleeding   . Codeine Nausea And Vomiting  . Ketorolac Tromethamine Other (See Comments)    Reaction:  Headache   . Nsaids Other (See Comments)    Reaction:  GI bleeding   . Phenazopyridine Hcl Other (See Comments)    Reaction:  Vision problems     REVIEW OF SYSTEMS:   CONSTITUTIONAL: No fever, fatigue or weakness.  EYES: No blurred or double vision.  EARS, NOSE, AND THROAT: No tinnitus or ear pain.  RESPIRATORY: No cough, shortness of breath, wheezing or hemoptysis.  CARDIOVASCULAR: No chest pain, orthopnea, edema.  GASTROINTESTINAL: Positive for nausea, vomiting, no diarrhea or abdominal pain.  GENITOURINARY: No dysuria, hematuria.  ENDOCRINE: No polyuria, nocturia,  HEMATOLOGY: No anemia, easy bruising or bleeding SKIN: No rash or lesion. MUSCULOSKELETAL: No joint pain or arthritis.   NEUROLOGIC: No tingling, numbness, weakness.  PSYCHIATRY: No anxiety or depression.   MEDICATIONS AT HOME:  Prior to Admission medications   Medication Sig Start Date End Date Taking? Authorizing Provider  acetaminophen (TYLENOL) 325 MG tablet Take 2 tablets (650 mg total) by mouth every 6 (six) hours as needed for mild pain or moderate pain (temp > 101.5). 05/25/15   Nicholes Mango, MD  albuterol (PROVENTIL) (2.5 MG/3ML) 0.083% nebulizer solution Take 3  mLs (2.5 mg total) by nebulization every 4 (four) hours as needed for wheezing or shortness of breath. 05/25/15   Nicholes Mango, MD  alendronate (FOSAMAX) 70 MG tablet Take 70 mg by mouth once a week. Take with a full glass of water on an empty stomach.    Historical Provider, MD  amiodarone (PACERONE) 200 MG tablet Take 1 tablet (200 mg total) by  mouth daily. Pt is able to take an additional tablet if needed for breakthrough arrhythmias. 08/04/15   Minna Merritts, MD  amoxicillin-clavulanate (AUGMENTIN) 875-125 MG tablet Take 1 tablet by mouth 2 (two) times daily. 05/25/15   Nicholes Mango, MD  Carbidopa-Levodopa ER (SINEMET CR) 25-100 MG tablet controlled release Take 1 tablet by mouth 2 (two) times daily. 05/25/15   Nicholes Mango, MD  cephALEXin (KEFLEX) 500 MG capsule Take 1 capsule (500 mg total) by mouth 3 (three) times daily. 10/19/15 10/29/15  Daymon Larsen, MD  dimenhyDRINATE (DRAMAMINE) 50 MG tablet Take 25-50 mg by mouth every 6 (six) hours as needed for dizziness.     Historical Provider, MD  docusate sodium (COLACE) 100 MG capsule Take 100 mg by mouth 2 (two) times daily as needed for mild constipation.    Historical Provider, MD  DULoxetine (CYMBALTA) 30 MG capsule Take 30 mg by mouth daily. Pt takes with a 60mg  capsule.    Historical Provider, MD  DULoxetine (CYMBALTA) 60 MG capsule Take 60 mg by mouth daily. Pt takes with a 30mg  capsule.    Historical Provider, MD  feeding supplement, ENSURE ENLIVE, (ENSURE ENLIVE) LIQD Take 237 mLs by mouth 2 (two) times daily between meals. 01/28/15   Bettey Costa, MD  fluticasone (FLONASE) 50 MCG/ACT nasal spray Place 2 sprays into both nostrils daily as needed for rhinitis.    Historical Provider, MD  gabapentin (NEURONTIN) 100 MG capsule Take 200-400 mg by mouth 3 (three) times daily. Pt takes three capsules in the morning, two capsules in the evening, and four capsules at bedtime.    Historical Provider, MD  losartan (COZAAR) 50 MG tablet TAKE ONE-HALF (1/2)  TABLET (25MG ) BY MOUTH DAILY 06/04/15   Minna Merritts, MD  lovastatin (MEVACOR) 40 MG tablet Take 40 mg by mouth at bedtime.    Historical Provider, MD  mometasone-formoterol (DULERA) 100-5 MCG/ACT AERO Inhale 2 puffs into the lungs 2 (two) times daily. 05/25/15   Nicholes Mango, MD  montelukast (SINGULAIR) 10 MG tablet Take 10 mg by mouth at bedtime.    Historical Provider, MD  nitrofurantoin, macrocrystal-monohydrate, (MACROBID) 100 MG capsule Take 1 capsule (100 mg total) by mouth at bedtime. 10/09/14   Nori Riis, PA-C  oxyCODONE (OXY IR/ROXICODONE) 5 MG immediate release tablet Take 1 tablet (5 mg total) by mouth every 6 (six) hours as needed for moderate pain or severe pain. 05/25/15   Nicholes Mango, MD  pantoprazole (PROTONIX) 40 MG tablet Take 40 mg by mouth daily.    Historical Provider, MD  polyethylene glycol (MIRALAX / GLYCOLAX) packet Take 17 g by mouth daily as needed for moderate constipation.     Historical Provider, MD  potassium chloride (K-DUR,KLOR-CON) 10 MEQ tablet Take 2 tablets (20 mEq total) by mouth 2 (two) times daily. 07/20/15   Minna Merritts, MD  senna (SENOKOT) 8.6 MG TABS tablet Take 2 tablets (17.2 mg total) by mouth 2 (two) times daily. 05/25/15   Nicholes Mango, MD  tiotropium (SPIRIVA) 18 MCG inhalation capsule Place 18 mcg into inhaler and inhale daily.    Historical Provider, MD  torsemide (DEMADEX) 20 MG tablet Take 1-2 tablets (20-40 mg total) by mouth 2 (two) times daily as needed (for edema). 07/17/15   Minna Merritts, MD  triamcinolone ointment (KENALOG) 0.1 % Apply 1 application topically 2 (two) times daily as needed (for rash).     Historical Provider, MD      PHYSICAL EXAMINATION:  VITAL SIGNS: Blood pressure 131/69, pulse (!) 54, temperature 98.2 F (36.8 C), temperature source Oral, resp. rate 15, height 5\' 1"  (1.549 m), weight 76.2 kg (168 lb), SpO2 98 %.  GENERAL:  82 y.o.-year-old patient lying in the bed with no acute distress.  EYES: Pupils  equal, round, reactive to light and accommodation. No scleral icterus. Extraocular muscles intact.  HEENT: Head atraumatic, normocephalic. Oropharynx and nasopharynx clear.  NECK:  Supple, no jugular venous distention. No thyroid enlargement, no tenderness.  LUNGS: Normal breath sounds bilaterally, no wheezing, rales,rhonchi or crepitation. No use of accessory muscles of respiration.  CARDIOVASCULAR: S1, S2 normal. No murmurs, rubs, or gallops.  ABDOMEN: Soft, nontender, nondistended. Bowel sounds present. No organomegaly or mass.  EXTREMITIES: No pedal edema, cyanosis, or clubbing.  NEUROLOGIC: Cranial nerves II through XII are intact. Muscle strength 5/5 in all extremities. Sensation intact. Gait not checked.  PSYCHIATRIC: The patient is alert and oriented x 3.  SKIN: No obvious rash, lesion, or ulcer.   LABORATORY PANEL:   CBC  Recent Labs Lab 10/19/15 1053 10/22/15 1534  WBC 6.7 5.3  HGB 11.6* 11.1*  HCT 32.2* 30.8*  PLT 228 206  MCV 90.4 88.6  MCH 32.5 32.0  MCHC 35.9 36.1*  RDW 14.1 14.6*  LYMPHSABS 1.1 0.9*  MONOABS 0.5 0.5  EOSABS 0.1 0.0  BASOSABS 0.1 0.0   ------------------------------------------------------------------------------------------------------------------  Chemistries   Recent Labs Lab 10/19/15 1053 10/22/15 1533  NA 139 139  K 3.6 3.3*  CL 102 101  CO2 29 28  GLUCOSE 116* 131*  BUN 10 11  CREATININE 0.71 0.74  CALCIUM 9.5 9.6  AST 19 35  ALT <5* <5*  ALKPHOS 95 94  BILITOT 0.4 0.2*   ------------------------------------------------------------------------------------------------------------------ estimated creatinine clearance is 51.5 mL/min (by C-G formula based on SCr of 0.8 mg/dL). ------------------------------------------------------------------------------------------------------------------ No results for input(s): TSH, T4TOTAL, T3FREE, THYROIDAB in the last 72 hours.  Invalid input(s): FREET3   Coagulation profile No  results for input(s): INR, PROTIME in the last 168 hours. ------------------------------------------------------------------------------------------------------------------- No results for input(s): DDIMER in the last 72 hours. -------------------------------------------------------------------------------------------------------------------  Cardiac Enzymes No results for input(s): CKMB, TROPONINI, MYOGLOBIN in the last 168 hours.  Invalid input(s): CK ------------------------------------------------------------------------------------------------------------------ Invalid input(s): POCBNP  ---------------------------------------------------------------------------------------------------------------  Urinalysis    Component Value Date/Time   COLORURINE YELLOW (A) 10/22/2015 1845   APPEARANCEUR CLEAR (A) 10/22/2015 1845   APPEARANCEUR Hazy (A) 08/07/2014 1124   LABSPEC 1.009 10/22/2015 1845   LABSPEC 1.014 01/14/2014 1543   PHURINE 5.0 10/22/2015 1845   GLUCOSEU NEGATIVE 10/22/2015 1845   GLUCOSEU >=500 01/14/2014 1543   GLUCOSEU NEGATIVE 09/17/2008 1107   HGBUR NEGATIVE 10/22/2015 1845   BILIRUBINUR NEGATIVE 10/22/2015 1845   BILIRUBINUR Negative 08/07/2014 1124   BILIRUBINUR Negative 01/14/2014 1543   KETONESUR TRACE (A) 10/22/2015 1845   PROTEINUR NEGATIVE 10/22/2015 1845   UROBILINOGEN 0.2 09/17/2008 1107   NITRITE NEGATIVE 10/22/2015 1845   LEUKOCYTESUR 2+ (A) 10/22/2015 1845   LEUKOCYTESUR 1+ (A) 08/07/2014 1124   LEUKOCYTESUR Negative 01/14/2014 1543     RADIOLOGY: No results found.  EKG: Orders placed or performed during the hospital encounter of 10/22/15  . EKG 12-Lead  . EKG 12-Lead    IMPRESSION AND PLAN:  * Nausea and vomiting   Give IV Zofran for now.   She has history of peptic ulcer disease and GI bleed.   She denies over-the-counter pain medication use.   Physical give empirically sucralfate and PPI oral.   As she has weight  loss also will get  GI consult.  * Sludge in gallbladder   She does not have any symptoms of cholecystitis and her LFTs are not elevated but because of her symptoms are unexplained for last 2-3 weeks alcohol general surgery consult.  * COPD   Continue inhalers, no active wheezing currently.  * Hyperlipidemia   Continue stating.  * History of DVT   90 relation currently because of history of GI bleed.    All the records are reviewed and case discussed with ED provider. Management plans discussed with the patient, family and they are in agreement.  CODE STATUS: Code Status History    Date Active Date Inactive Code Status Order ID Comments User Context   05/21/2015 10:34 AM 05/25/2015  5:36 PM DNR [id number]  Flora Lipps, MD Inpatient   05/19/2015  4:56 PM 05/21/2015 10:34 AM Full Code [id number]  Flora Lipps, MD ED   01/27/2015  5:23 PM 01/28/2015  4:52 PM Full Code [id number]  [name], MD ED    Questions for Most Recent Historical Code Status (Order [id number])    Question Answer Comment   In the event of cardiac or respiratory ARREST Do not call a "code blue"    In the event of cardiac or respiratory ARREST Do not perform Intubation, CPR, defibrillation or ACLS    In the event of cardiac or respiratory ARREST Use medication by any route, position, wound care, and other measures to relive pain and suffering. May use oxygen, suction and manual treatment of airway obstruction as needed for comfort.        TOTAL TIME TAKING CARE OF THIS PATIENT: 50  minutes.    Vaughan Basta M.D on 10/22/2015   Between 7am to 6pm - Pager - [phone]  After 6pm go to [email or web address] - password [email or web address] [facility]  Office  [phone]  CC: Primary care physician; BABAOFF, Caryl Bis, MD   Note: This dictation was prepared with Dragon dictation along with smaller phrase technology. Any transcriptional errors that result from this process are unintentional.

## 2015-10-23 DIAGNOSIS — R1110 Vomiting, unspecified: Secondary | ICD-10-CM

## 2015-10-23 DIAGNOSIS — R112 Nausea with vomiting, unspecified: Secondary | ICD-10-CM | POA: Diagnosis not present

## 2015-10-23 DIAGNOSIS — E785 Hyperlipidemia, unspecified: Secondary | ICD-10-CM | POA: Diagnosis not present

## 2015-10-23 DIAGNOSIS — E43 Unspecified severe protein-calorie malnutrition: Secondary | ICD-10-CM | POA: Insufficient documentation

## 2015-10-23 DIAGNOSIS — R634 Abnormal weight loss: Secondary | ICD-10-CM | POA: Diagnosis not present

## 2015-10-23 DIAGNOSIS — J449 Chronic obstructive pulmonary disease, unspecified: Secondary | ICD-10-CM | POA: Diagnosis not present

## 2015-10-23 LAB — CBC
HCT: 29.2 % — ABNORMAL LOW (ref 35.0–47.0)
HEMOGLOBIN: 10.3 g/dL — AB (ref 12.0–16.0)
MCH: 32.1 pg (ref 26.0–34.0)
MCHC: 35.4 g/dL (ref 32.0–36.0)
MCV: 90.8 fL (ref 80.0–100.0)
PLATELETS: 208 10*3/uL (ref 150–440)
RBC: 3.22 MIL/uL — AB (ref 3.80–5.20)
RDW: 14.3 % (ref 11.5–14.5)
WBC: 5.4 10*3/uL (ref 3.6–11.0)

## 2015-10-23 LAB — BASIC METABOLIC PANEL
ANION GAP: 6 (ref 5–15)
BUN: 7 mg/dL (ref 6–20)
CALCIUM: 8.7 mg/dL — AB (ref 8.9–10.3)
CO2: 27 mmol/L (ref 22–32)
Chloride: 110 mmol/L (ref 101–111)
Creatinine, Ser: 0.63 mg/dL (ref 0.44–1.00)
Glucose, Bld: 89 mg/dL (ref 65–99)
Potassium: 3.4 mmol/L — ABNORMAL LOW (ref 3.5–5.1)
SODIUM: 143 mmol/L (ref 135–145)

## 2015-10-23 LAB — MRSA PCR SCREENING: MRSA BY PCR: NEGATIVE

## 2015-10-23 MED ORDER — DOCUSATE SODIUM 100 MG PO CAPS
100.0000 mg | ORAL_CAPSULE | Freq: Two times a day (BID) | ORAL | Status: DC
  Administered 2015-10-23 – 2015-10-24 (×3): 100 mg via ORAL
  Filled 2015-10-23 (×3): qty 1

## 2015-10-23 MED ORDER — POLYETHYLENE GLYCOL 3350 17 G PO PACK: 17.0000 g | PACK | Freq: Every day | ORAL | Status: DC | PRN

## 2015-10-23 MED ORDER — SENNA 8.6 MG PO TABS
2.0000 | ORAL_TABLET | Freq: Every day | ORAL | Status: DC
  Administered 2015-10-23 – 2015-10-24 (×2): 17.2 mg via ORAL
  Filled 2015-10-23 (×2): qty 2

## 2015-10-23 NOTE — Clinical Social Work Note (Signed)
Nursing consulted CSW due to patient being from [facility] as listed in admission summary. Staff thought patient was from a facility. [facility]. Please re-consult CSW if needed. Shela Leff MSW,LCSW [phone]

## 2015-10-23 NOTE — Progress Notes (Signed)
Initial Nutrition Assessment  DOCUMENTATION CODES:   Severe malnutrition in context of acute illness/injury  INTERVENTION:  -Encouraged intake of ensure enlive supplement for added kcals and protein.   -Discussed foods to maximize nutrition.  Pt verbalized understanding and expect good compliance.   NUTRITION DIAGNOSIS:   Malnutrition related to altered GI function, acute illness as evidenced by energy intake < or equal to 50% for > or equal to 5 days, percent weight loss.    GOAL:   Patient will meet greater than or equal to 90% of their needs    MONITOR:   PO intake, Supplement acceptance  REASON FOR ASSESSMENT:   Malnutrition Screening Tool    ASSESSMENT:      Pt admitted with nausea, vomiting for the past 2-3 weeks.  Noted per surgery pt was seen in ER earlier this week without these complaints.    Pt with history of anemia, asthma, cerebral aneurysm, CHF, COPD, diverticulitis, DVT, fibromyalgia, cardiomyopathy, PUD, and prior GI bleed, chronic constipation   Pt reports poor po intake for the past 2-3 weeks secondary to nausea, vomiting episodes. Ate 95% of meatloaf and 100% of mashed potatoes. Pt reports lunch was "really good better than the food at my place."  Medications reviewed: colace, carafate Labs reviewed: K 3.4  Nutrition-Focused physical exam completed. Findings are WDL for fat depletion, muscle depletion, and edema.    Diet Order:  Diet 2 gram sodium Room service appropriate? Yes; Fluid consistency: Thin  Skin:  Reviewed, no issues  Last BM:  9/6  Height:   Ht Readings from Last 1 Encounters:  10/22/15 5\' 1"  (1.549 m)    Weight: 11% wt loss in the last 3 weeks  Wt Readings from Last 1 Encounters:  10/22/15 168 lb (76.2 kg)    Ideal Body Weight:     BMI:  Body mass index is 31.74 kg/m.  Estimated Nutritional Needs:   Kcal:  [id number] kcals/d  Protein:  91-114 g/d  Fluid:  >/= 1975ml/d  EDUCATION NEEDS:   No education  needs identified at this time  Alverto Shedd B. Zenia Resides, [place], [place] (pager) Weekend/On-Call pager [phone])

## 2015-10-23 NOTE — Care Management (Signed)
Patient admitted from [facility] with nausea and vomiting.  Patient lives in independent living.  Daughter and son live locally for support.  Patient states that she has chronic continuous O2 through [facility], she states a family member can bring a portable tank for discharge.  Patient obtains medications from [facility].  Patient states that her out of pocket expense is $200.  She has been able to afford these with financial support from her daughter.  Patient states that she ambulates with a RW.  Otherwise she is independent at baseline.  PCP = BABAOFF

## 2015-10-23 NOTE — Progress Notes (Signed)
[facility] [place] NAME: Hailey [name]    MR#:  [id number]  DATE OF BIRTH:  [birth year]  SUBJECTIVE:  CHIEF COMPLAINT:   Chief Complaint  Patient presents with  . Nausea   - Nausea and vomiting are better today. Still has some right upper quadrant discomfort on deep palpation. -Awaiting surgical consult  REVIEW OF SYSTEMS:  Review of Systems  Constitutional: Negative for chills, fever and malaise/fatigue.  HENT: Negative for ear discharge, ear pain and nosebleeds.   Eyes: Negative for blurred vision and double vision.  Respiratory: Negative for cough, shortness of breath and wheezing.   Cardiovascular: Negative for chest pain, palpitations and leg swelling.  Gastrointestinal: Positive for abdominal pain and nausea. Negative for constipation, diarrhea and vomiting.  Genitourinary: Negative for dysuria and urgency.  Musculoskeletal: Negative for myalgias.  Neurological: Negative for dizziness, sensory change, speech change, focal weakness, seizures and headaches.  Psychiatric/Behavioral: Negative for depression.    DRUG ALLERGIES:   Allergies  Allergen Reactions  . Aspirin Other (See Comments)    Reaction:  GI bleeding   . Codeine Nausea And Vomiting  . Ketorolac Tromethamine Other (See Comments)    Reaction:  Headache   . Nsaids Other (See Comments)    Reaction:  GI bleeding   . Phenazopyridine Hcl Other (See Comments)    Reaction:  Vision problems     VITALS:  Blood pressure (!) 126/41, pulse (!) 53, temperature 97.6 F (36.4 C), temperature source Oral, resp. rate 18, height 5\' 1"  (1.549 m), weight 76.2 kg (168 lb), SpO2 99 %.  PHYSICAL EXAMINATION:  Physical Exam  GENERAL:  82 y.o.-year-old patient lying in the bed with no acute distress.  EYES: Pupils equal, round, reactive to light and accommodation. No scleral icterus. Extraocular muscles intact.  HEENT: Head atraumatic, normocephalic. Oropharynx and nasopharynx  clear.  NECK:  Supple, no jugular venous distention. No thyroid enlargement, no tenderness.  LUNGS: Normal breath sounds bilaterally, no wheezing, rales,rhonchi or crepitation. No use of accessory muscles of respiration.  CARDIOVASCULAR: S1, S2 normal. No murmurs, rubs, or gallops.  ABDOMEN: Soft, RUQ discomfort on deep palpation, nondistended. Bowel sounds present. No organomegaly or mass.  EXTREMITIES: No pedal edema, cyanosis, or clubbing.  NEUROLOGIC: Cranial nerves II through XII are intact. Muscle strength 5/5 in all extremities. Sensation intact. Gait not checked.  PSYCHIATRIC: The patient is alert and oriented x 3.  SKIN: No obvious rash, lesion, or ulcer.    LABORATORY PANEL:   CBC  Recent Labs Lab 10/23/15 0441  WBC 5.4  HGB 10.3*  HCT 29.2*  PLT 208   ------------------------------------------------------------------------------------------------------------------  Chemistries   Recent Labs Lab 10/22/15 1533 10/23/15 0441  NA 139 143  K 3.3* 3.4*  CL 101 110  CO2 28 27  GLUCOSE 131* 89  BUN 11 7  CREATININE 0.74 0.63  CALCIUM 9.6 8.7*  AST 35  --   ALT <5*  --   ALKPHOS 94  --   BILITOT 0.2*  --    ------------------------------------------------------------------------------------------------------------------  Cardiac Enzymes No results for input(s): TROPONINI in the last 168 hours. ------------------------------------------------------------------------------------------------------------------  RADIOLOGY:  Dg Chest Port 1 View  Result Date: 10/22/2015 CLINICAL DATA:  82 year old female with cough. EXAM: PORTABLE CHEST 1 VIEW COMPARISON:  Chest radiograph dated 05/19/2015 FINDINGS: Single portable view of the chest demonstrates emphysematous changes of the lungs with bibasilar atelectasis/ scarring. No focal consolidation, pleural effusion, or pneumothorax. The cardiac silhouette is within normal limits.  No acute osseous pathology identified.  IMPRESSION: No focal consolidation. Electronically Signed   By: Anner Crete M.D.   On: 10/22/2015 23:09    EKG:   Orders placed or performed during the hospital encounter of 10/22/15  . EKG 12-Lead  . EKG 12-Lead    ASSESSMENT AND PLAN:   82 y/o F with PMH of cerebral aneurysm, CHF, COPD on home oxygen, fibromyalgia, hypertrophic cardiomyopathy, OSA, PAF, peptic ulcer disease presents to the hospital secondary to worsening nausea and vomiting associated with 2-3 week history of right upper quadrant pain.  #1 abdominal pain with nausea and vomiting-sounds more like subacute gallbladder disease. -Acute episode of nausea vomiting yesterday which resolved now. Diet advanced to regular diet. -Appreciate surgical consult. GI input to see if patient will need an EGD. -If no further GI workup can be done, height is scan will be ordered to evaluate gallbladder disease. -Continue pain medication and nausea medications.  #2 neuropathy-on gabapentin and Cymbalta  #3 hypertension-on losartan  #4 chronic A. fib-on amiodarone. Not on anticoagulation due to history of GI bleed  #5 DVT prophylaxis-subcutaneous heparin added    All the records are reviewed and case discussed with Care Management/Social Workerr. Management plans discussed with the patient, family and they are in agreement.  CODE STATUS: Full Code  TOTAL TIME TAKING CARE OF THIS PATIENT: 37 minutes.   POSSIBLE D/C IN 1-2 DAYS, DEPENDING ON CLINICAL CONDITION.   Gladstone Lighter M.D on 10/23/2015 at 1:42 PM  Between 7am to 6pm - Pager - [phone]  After 6pm go to [email or web address] - password [facility]  Office  [phone]  CC: Primary care physician; BABAOFF, Caryl Bis, MD

## 2015-10-23 NOTE — Consult Note (Signed)
Patient ID: Hailey [name], female   DOB: [birth year], 82 y.o.   MRN: [id number]  CC: NAUSEA  HPI Hailey [name] is a 82 y.o. female who was admitted to the medicine service for chronic nausea and vomiting. Surgery consultation requested by Dr. Tressia Miners for evaluation of possible gallbladder pathology. Patient has a known history of anemia, asthma, cerebral aneurysm, CHF, COPD, diverticulitis, DVT, fibromyalgia, cardiomyopathy, peptic ulcer disease and a prior GI bleed. Patient reports she started feeling sick and has had persistent nausea and vomiting. She states is decreased because she quit eating out of fear. She also reports weight loss. Interestingly the patient was seen in the ER earlier this week without any of these complaints. She has tried multiple nausea medications without relief. Upon further questioning the patient has tolerated diet today without nausea, vomiting. She states she's had chronic constipation and her last bowel movement was Tuesday after the use of an enema. She denies any fevers, chills, diarrhea.  HPI  Past Medical History:  Diagnosis Date  . Anemia    Chronic  . Anxiety   . Arthritis   . Asthma   . Atrophic vaginitis   . Cerebral aneurysm    Hx of  . CHF (congestive heart failure) ([place])   . COPD (chronic obstructive pulmonary disease) ([place])   . Diverticulitis   . DVT (deep vein thrombosis) in pregnancy 09/2008   a. LLE, recurrent hx, has IVC filter. Not on coumadin now with history of GI bleeding  . Fibrocystic disease of breast   . Fibromyalgia   . GERD (gastroesophageal reflux disease)   . Gout   . Gross hematuria   . Heart murmur   . History of colonoscopy   . Hyperlipidemia   . Hypertension   . Hypertrophic cardiomyopathy ([place])    a. Echo 4/09 with EF 70-75%, asymmetricy basal septal hypertrophy, mild MR without systolic anterior motion of the mitral valve, LVOT gradient to 130 mmHg with Valsalva b. Echo 7/13: EF 60-65%, mild focal basal septal  hypertrophy, no significant LVOT gradient, no MV SAM c. Echo 2/15: EF 55-60%, HOCM, resting LVOT gradient 29 mmHg, Valsalva LVOT gradient > 140 mmHg, mild LVH, mild TR, elevated PASP  . Incomplete bladder emptying   . Migraine headache    Hx of  . Obesity   . OSA (obstructive sleep apnea)    a. Intolerant to CPAP, wears 2L via n/c  . Osteoarthritis    Hx of left TKR  . PAF (paroxysmal atrial fibrillation) ([place])    a. Full-dose ASA alone 2/2 h/o GIB.  Marland Kitchen PAT (paroxysmal atrial tachycardia) ([place])   . 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 Pneumonia   . PUD (peptic ulcer disease)    With GI bleeding  . Pulmonary embolism ([place])   . Tremor     Past Surgical History:  Procedure Laterality Date  . ABDOMINAL HYSTERECTOMY    . APPENDECTOMY    . BLADDER SUSPENSION    . CARDIAC CATHETERIZATION  2009   No significant CAD  . CARDIOVASCULAR STRESS TEST     a. Lexiscan Myoview 3/14: EF 76%, no evidence of ischemia or WMAs  . cataract surgery    . CEREBRAL ANEURYSM REPAIR  1998  . FOOT SURGERY     Bilateral  . INGUINAL HERNIA REPAIR  1968   Right  . KNEE ARTHROSCOPY  2009   Right  . SHOULDER SURGERY  1972   Right   Left  2012  . TONSILLECTOMY AND ADENOIDECTOMY    . TOTAL KNEE ARTHROPLASTY     Left    Family History  Problem Relation Age of Onset  . Pancreatic cancer Mother   . Hypertension Mother     father  . Diabetes Mellitus II Mother     Sister  . Colon cancer Father   . Colon polyps Father   . Heart disease Father   . Stroke Father   . Heart disease Sister     More than 1 sister  . Colon cancer Brother   . Colon polyps Other     Siblings  . Ulcers Brother     Social History Social History  Substance Use Topics  . Smoking status: Former Smoker    Packs/day: 1.00    Years: 60.00    Types: Cigarettes    Quit date: 12/15/2008  . Smokeless tobacco: Never Used     Comment: smoked for 50 years quit 5 + years  . Alcohol use 0.0  oz/week     Comment: Socially    Allergies  Allergen Reactions  . Aspirin Other (See Comments)    Reaction:  GI bleeding   . Codeine Nausea And Vomiting  . Ketorolac Tromethamine Other (See Comments)    Reaction:  Headache   . Nsaids Other (See Comments)    Reaction:  GI bleeding   . Phenazopyridine Hcl Other (See Comments)    Reaction:  Vision problems     Current Facility-Administered Medications  Medication Dose Route Frequency Provider Last Rate Last Dose  . acetaminophen (TYLENOL) tablet 650 mg  650 mg Oral Q6H PRN Lance Coon, MD   650 mg at 10/22/15 2305  . albuterol (PROVENTIL) (2.5 MG/3ML) 0.083% nebulizer solution 2.5 mg  2.5 mg Nebulization Q4H PRN Vaughan Basta, MD      . amiodarone (PACERONE) tablet 200 mg  200 mg Oral Daily Vaughan Basta, MD   200 mg at 10/23/15 1006  . Carbidopa-Levodopa ER (SINEMET CR) 25-100 MG tablet controlled release 1 tablet  1 tablet Oral BID Vaughan Basta, MD   1 tablet at 10/23/15 1008  . cephALEXin (KEFLEX) capsule 500 mg  500 mg Oral TID Vaughan Basta, MD   500 mg at 10/23/15 1006  . docusate sodium (COLACE) capsule 100 mg  100 mg Oral BID Gladstone Lighter, MD   100 mg at 10/23/15 1006  . DULoxetine (CYMBALTA) DR capsule 30 mg  30 mg Oral Daily Vaughan Basta, MD   30 mg at 10/23/15 1008  . DULoxetine (CYMBALTA) DR capsule 60 mg  60 mg Oral Daily Vaughan Basta, MD   60 mg at 10/23/15 1009  . feeding supplement (ENSURE ENLIVE) (ENSURE ENLIVE) liquid 237 mL  237 mL Oral BID BM Vaughan Basta, MD   237 mL at 10/23/15 1013  . fluticasone (FLONASE) 50 MCG/ACT nasal spray 2 spray  2 spray Each Nare Daily PRN Vaughan Basta, MD      . gabapentin (NEURONTIN) capsule 200 mg  200 mg Oral QPM Vaughan Basta, MD      . gabapentin (NEURONTIN) capsule 300 mg  300 mg Oral q morning - 10a Vaughan Basta, MD   300 mg at 10/23/15 1007  . gabapentin (NEURONTIN) capsule 400 mg  400 mg  Oral QHS Vaughan Basta, MD   400 mg at 10/22/15 2153  . heparin injection 5,000 Units  5,000 Units Subcutaneous Q8H Vaughan Basta, MD   5,000 Units at 10/23/15 1322  . losartan (COZAAR) tablet  25 mg  25 mg Oral Daily Vaughan Basta, MD   25 mg at 10/23/15 1010  . MEDLINE mouth rinse  15 mL Mouth Rinse BID Vaughan Basta, MD      . mometasone-formoterol (DULERA) 100-5 MCG/ACT inhaler 2 puff  2 puff Inhalation BID Vaughan Basta, MD   2 puff at 10/23/15 1011  . montelukast (SINGULAIR) tablet 10 mg  10 mg Oral QHS Vaughan Basta, MD   10 mg at 10/22/15 2153  . ondansetron (ZOFRAN) injection 4 mg  4 mg Intravenous Q6H PRN Vaughan Basta, MD      . oxyCODONE (Oxy IR/ROXICODONE) immediate release tablet 5 mg  5 mg Oral Q6H PRN Vaughan Basta, MD      . pantoprazole (PROTONIX) EC tablet 40 mg  40 mg Oral Daily Vaughan Basta, MD   40 mg at 10/23/15 1007  . polyethylene glycol (MIRALAX / GLYCOLAX) packet 17 g  17 g Oral Daily PRN Gladstone Lighter, MD      . potassium chloride SA (K-DUR,KLOR-CON) CR tablet 20 mEq  20 mEq Oral BID Vaughan Basta, MD   20 mEq at 10/23/15 1009  . potassium chloride SA (K-DUR,KLOR-CON) CR tablet 20 mEq  20 mEq Oral BID Vaughan Basta, MD   20 mEq at 10/23/15 1007  . pravastatin (PRAVACHOL) tablet 40 mg  40 mg Oral q1800 Vaughan Basta, MD   40 mg at 10/22/15 2153  . senna (SENOKOT) tablet 17.2 mg  2 tablet Oral Daily Gladstone Lighter, MD   17.2 mg at 10/23/15 1007  . sucralfate (CARAFATE) tablet 1 g  1 g Oral TID WC & HS Vaughan Basta, MD   1 g at 10/23/15 1125  . tiotropium (SPIRIVA) inhalation capsule 18 mcg  18 mcg Inhalation Daily Vaughan Basta, MD   18 mcg at 10/23/15 1010     Review of Systems A Multi-point review of systems was asked and was negative except for the findings documented in the history of present illness  Physical Exam Blood pressure (!) 126/41,  pulse (!) 53, temperature 97.6 F (36.4 C), temperature source Oral, resp. rate 18, height 5\' 1"  (1.549 m), weight 76.2 kg (168 lb), SpO2 99 %. CONSTITUTIONAL: Resting in bed in no acute distress. EYES: Pupils are equal, round, and reactive to light, Sclera are non-icteric. EARS, NOSE, MOUTH AND THROAT: The oropharynx is clear. The oral mucosa is pink and moist. Hearing is intact to voice. LYMPH NODES:  Lymph nodes in the neck are normal. RESPIRATORY:  Lungs are clear. There is normal respiratory effort, with equal breath sounds bilaterally, and without pathologic use of accessory muscles. CARDIOVASCULAR: Heart is regular without murmurs, gallops, or rubs. GI: The abdomen is soft, mildly tender to deep palpation in the midepigastric and right upper quadrant without any Murphy sign, and nondistended. There are no palpable masses. There is no hepatosplenomegaly. There are normal bowel sounds in all quadrants. GU: Rectal deferred.   MUSCULOSKELETAL: Normal muscle strength and tone. No cyanosis or edema.   SKIN: Turgor is good and there are no pathologic skin lesions or ulcers. NEUROLOGIC: Motor and sensation is grossly normal. Cranial nerves are grossly intact. PSYCH:  Oriented to person, place and time. Affect is normal.  Data Reviewed Images and labs reviewed. Labs significant for a hypokalemia of 3.4 and a mild anemia 10.3 over 29.2. Patient also with a urinalysis that shows bacteria with leukocytes. Images are all 86 days old but specifically for the gallbladder does potentially show some sludge but does not  show any pericholecystic fluid, gallbladder wall thickening, ductal dilatation. CT scan of the abdomen from 4 days ago shows a significant stool burden with redundant colon. A distended gallbladder but no evidence of inflammation. No free fluid, free air, obstruction. I have personally reviewed the patient's imaging, laboratory findings and medical records.    Assessment    82 year old  female with chronic nausea and vomiting    Plan    82 year old female with numerous medical problems and chronic nausea and vomiting. Concern for gallbladder disease given sludge and distended gallbladder and images. However, there is no elevated LFTs or recent images. Her chronic nausea and vomiting of a projectile type is not consistent with subacute gallbladder disease. Would rule out other causes of nausea and vomiting prior to proceeding with gallbladder. If no other cause of her nausea and vomiting could be obtained within consider a HIDA scan to completely rule in or out her gallbladder as the source. No current plans for any operative intervention. We'll defer further workup to GI at this time.     Time spent with the patient was 60 minutes, with more than 50% of the time spent in face-to-face education, counseling and care coordination.     Clayburn Pert, MD FACS General Surgeon 10/23/2015, 1:33 PM

## 2015-10-23 NOTE — Care Management Obs Status (Signed)
[facility] NOTIFICATION   Patient Details  Name: Hailey [name] MRN: [id number] Date of Birth: 1932/07/09   Medicare Observation Status Notification Given:  Yes Signed and sent to HIM    Beverly Sessions, RN 10/23/2015, 12:07 PM

## 2015-10-23 NOTE — Consult Note (Signed)
Hailey Lame, MD [name] [name], [place] [address] Phone: [phone] Fax : [phone]  Consultation  Referring Provider:     No ref. provider found Primary Care Physician:  Marcello Fennel, MD Primary Gastroenterologist:  Dr. Gustavo Lah         Reason for Consultation:     Nausea  Date of Admission:  10/22/2015 Date of Consultation:  10/23/2015         HPI:   Hailey [name] is a 82 y.o. female who is admitted with nausea for the last few weeks.  The patient was seen by her primary care provider and given antinausea medication.  The patient continued to have nausea and reports a 20 pound weight loss due to the nausea and vomiting.  The patient denies vomiting any blood.  The patient does have a history of GI bleeding in the past and states that she underwent a upper endoscopy by Dr. Gustavo Lah to stop the bleeding. Since being admitted the patient reports that she is not having any further nausea or vomiting.  The patient also states that she tolerated food all day today. The patient was reporting some abdominal pain that she stated was a sharp pain that would come in her right upper quadrant.  The patient was seen by surgery who was not convinced that the patient had gallbladder problems.  The patient also states that she did not think she was constipated but had an enema with good results just before her nausea and vomiting went away.  The patient was also in the ER  Earlier this week with the same complaints and sent home at that time.  Past Medical History:  Diagnosis Date  . Anemia    Chronic  . Anxiety   . Arthritis   . Asthma   . Atrophic vaginitis   . Cerebral aneurysm    Hx of  . CHF (congestive heart failure) ([place])   . COPD (chronic obstructive pulmonary disease) ([place])   . Diverticulitis   . DVT (deep vein thrombosis) in pregnancy 09/2008   a. LLE, recurrent hx, has IVC filter. Not on coumadin now with history of GI bleeding  . Fibrocystic disease of breast     . Fibromyalgia   . GERD (gastroesophageal reflux disease)   . Gout   . Gross hematuria   . Heart murmur   . History of colonoscopy   . Hyperlipidemia   . Hypertension   . Hypertrophic cardiomyopathy ([place])    a. Echo 4/09 with EF 70-75%, asymmetricy basal septal hypertrophy, mild MR without systolic anterior motion of the mitral valve, LVOT gradient to 130 mmHg with Valsalva b. Echo 7/13: EF 60-65%, mild focal basal septal hypertrophy, no significant LVOT gradient, no MV SAM c. Echo 2/15: EF 55-60%, HOCM, resting LVOT gradient 29 mmHg, Valsalva LVOT gradient > 140 mmHg, mild LVH, mild TR, elevated PASP  . Incomplete bladder emptying   . Migraine headache    Hx of  . Obesity   . OSA (obstructive sleep apnea)    a. Intolerant to CPAP, wears 2L via n/c  . Osteoarthritis    Hx of left TKR  . PAF (paroxysmal atrial fibrillation) ([place])    a. Full-dose ASA alone 2/2 h/o GIB.  Marland Kitchen PAT (paroxysmal atrial tachycardia) ([place])   . 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 Pneumonia   . PUD (peptic ulcer disease)    With GI bleeding  . Pulmonary embolism (  [place])   . Tremor     Past Surgical History:  Procedure Laterality Date  . ABDOMINAL HYSTERECTOMY    . APPENDECTOMY    . BLADDER SUSPENSION    . CARDIAC CATHETERIZATION  2009   No significant CAD  . CARDIOVASCULAR STRESS TEST     a. Lexiscan Myoview 3/14: EF 76%, no evidence of ischemia or WMAs  . cataract surgery    . CEREBRAL ANEURYSM REPAIR  1998  . FOOT SURGERY     Bilateral  . INGUINAL HERNIA REPAIR  1968   Right  . KNEE ARTHROSCOPY  2009   Right  . SHOULDER SURGERY  1972   Right   Left 2012  . TONSILLECTOMY AND ADENOIDECTOMY    . TOTAL KNEE ARTHROPLASTY     Left    Prior to Admission medications   Medication Sig Start Date End Date Taking? Authorizing Provider  acetaminophen (TYLENOL) 325 MG tablet Take 2 tablets (650 mg total) by mouth every 6 (six) hours as needed for mild pain or  moderate pain (temp > 101.5). 05/25/15  Yes Nicholes Mango, MD  albuterol (PROVENTIL HFA;VENTOLIN HFA) 108 (90 Base) MCG/ACT inhaler Inhale 1 puff into the lungs every 6 (six) hours as needed for wheezing or shortness of breath.   Yes Historical Provider, MD  alendronate (FOSAMAX) 70 MG tablet Take 70 mg by mouth once a week. Take with a full glass of water on an empty stomach.   Yes Historical Provider, MD  amiodarone (PACERONE) 200 MG tablet Take 1 tablet (200 mg total) by mouth daily. Pt is able to take an additional tablet if needed for breakthrough arrhythmias. 08/04/15  Yes Minna Merritts, MD  Carbidopa-Levodopa ER (SINEMET CR) 25-100 MG tablet controlled release Take 1 tablet by mouth 2 (two) times daily. 05/25/15  Yes Nicholes Mango, MD  dimenhyDRINATE (DRAMAMINE) 50 MG tablet Take 25-50 mg by mouth every 6 (six) hours as needed for dizziness.    Yes Historical Provider, MD  docusate sodium (COLACE) 100 MG capsule Take 100 mg by mouth 2 (two) times daily as needed for mild constipation.   Yes Historical Provider, MD  DULoxetine (CYMBALTA) 30 MG capsule Take 30 mg by mouth daily. Pt takes with a 60mg  capsule.   Yes Historical Provider, MD  DULoxetine (CYMBALTA) 60 MG capsule Take 60 mg by mouth daily. Pt takes with a 30mg  capsule.   Yes Historical Provider, MD  fluticasone (FLONASE) 50 MCG/ACT nasal spray Place 2 sprays into both nostrils daily as needed for rhinitis.   Yes Historical Provider, MD  gabapentin (NEURONTIN) 100 MG capsule Take 200-400 mg by mouth 3 (three) times daily. Pt takes three capsules in the morning, two capsules in the evening, and four capsules at bedtime.   Yes Historical Provider, MD  losartan (COZAAR) 50 MG tablet TAKE ONE-HALF (1/2) TABLET (25MG ) BY MOUTH DAILY 06/04/15  Yes Minna [place]itts, MD  lovastatin (MEVACOR) 40 MG tablet Take 40 mg by mouth at bedtime.   Yes Historical Provider, MD  montelukast (SINGULAIR) 10 MG tablet Take 10 mg by mouth at bedtime.   Yes Historical  Provider, MD  nitrofurantoin, macrocrystal-monohydrate, (MACROBID) 100 MG capsule Take 1 capsule (100 mg total) by mouth at bedtime. 10/09/14  Yes Shannon A McGowan, PA-C  oxyCODONE (OXY IR/ROXICODONE) 5 MG immediate release tablet Take 1 tablet (5 mg total) by mouth every 6 (six) hours as needed for moderate pain or severe pain. 05/25/15  Yes Nicholes Mango, MD  pantoprazole ([place])  40 MG tablet Take 40 mg by mouth daily.   Yes Historical Provider, MD  polyethylene glycol (MIRALAX / GLYCOLAX) packet Take 17 g by mouth daily as needed for moderate constipation.    Yes Historical Provider, MD  potassium chloride (K-DUR,KLOR-CON) 10 MEQ tablet Take 2 tablets (20 mEq total) by mouth 2 (two) times daily. 07/20/15  Yes Minna Merritts, MD  senna (SENOKOT) 8.6 MG TABS tablet Take 2 tablets (17.2 mg total) by mouth 2 (two) times daily. 05/25/15  Yes Nicholes Mango, MD  torsemide (DEMADEX) 20 MG tablet Take 1-2 tablets (20-40 mg total) by mouth 2 (two) times daily as needed (for edema). 07/17/15  Yes Minna Merritts, MD  albuterol (PROVENTIL) (2.5 MG/3ML) 0.083% nebulizer solution Take 3 mLs (2.5 mg total) by nebulization every 4 (four) hours as needed for wheezing or shortness of breath. Patient not taking: Reported on 10/22/2015 05/25/15   Nicholes Mango, MD  cephALEXin (KEFLEX) 500 MG capsule Take 1 capsule (500 mg total) by mouth 3 (three) times daily. Patient not taking: Reported on 10/22/2015 10/19/15 10/29/15  Daymon Larsen, MD  feeding supplement, ENSURE ENLIVE, (ENSURE ENLIVE) LIQD Take 237 mLs by mouth 2 (two) times daily between meals. Patient not taking: Reported on 10/22/2015 01/28/15   Bettey Costa, MD  mometasone-formoterol (DULERA) 100-5 MCG/ACT AERO Inhale 2 puffs into the lungs 2 (two) times daily. Patient not taking: Reported on 10/22/2015 05/25/15   Nicholes Mango, MD  tiotropium (SPIRIVA) 18 MCG inhalation capsule Place 18 mcg into inhaler and inhale daily.    Historical Provider, MD  triamcinolone ointment  (KENALOG) 0.1 % Apply 1 application topically 2 (two) times daily as needed (for rash).     Historical Provider, MD    Family History  Problem Relation Age of Onset  . Pancreatic cancer Mother   . Hypertension Mother     father  . Diabetes Mellitus II Mother     Sister  . Colon cancer Father   . Colon polyps Father   . Heart disease Father   . Stroke Father   . Heart disease Sister     More than 1 sister  . Colon cancer Brother   . Colon polyps Other     Siblings  . Ulcers Brother      Social History  Substance Use Topics  . Smoking status: Former Smoker    Packs/day: 1.00    Years: 60.00    Types: Cigarettes    Quit date: 12/15/2008  . Smokeless tobacco: Never Used     Comment: smoked for 50 years quit 5 + years  . Alcohol use 0.0 oz/week     Comment: Socially    Allergies as of 10/22/2015 - Review Complete 10/22/2015  Allergen Reaction Noted  . Aspirin Other (See Comments) 07/12/2013  . Codeine Nausea And Vomiting   . Ketorolac tromethamine Other (See Comments) 10/25/2010  . Nsaids Other (See Comments) 10/25/2010  . Phenazopyridine hcl Other (See Comments)     Review of Systems:    All systems reviewed and negative except where noted in HPI.   Physical Exam:  Vital signs in last 24 hours: Temp:  [97.5 F (36.4 C)-98.6 F (37 C)] 98.1 F (36.7 C) (09/08 1402) Pulse Rate:  [51-58] 58 (09/08 1402) Resp:  [15-20] 18 (09/08 1402) BP: (118-161)/(41-69) 120/45 (09/08 1402) SpO2:  [98 %-99 %] 98 % (09/08 1402) Last BM Date: 10/21/15 General:   Pleasant, cooperative in NAD Head:  Normocephalic and atraumatic. Eyes:  No icterus.   Conjunctiva pink. PERRLA. Ears:  Normal auditory acuity. Neck:  Supple; no masses or thyroidomegaly Lungs: Respirations even and unlabored. Lungs clear to auscultation bilaterally.   No wheezes, crackles, or rhonchi.  Heart:  Regular rate and rhythm;  Without murmur, clicks, rubs or gallops Abdomen:  Soft, nondistended, nontender.  Normal bowel sounds. No appreciable masses or hepatomegaly.  No rebound or guarding.  Rectal:  Not performed. Msk:  Symmetrical without gross deformities.    Extremities:  Without edema, cyanosis or clubbing. Neurologic:  Alert and oriented x3;  grossly normal neurologically. Skin:  Intact without significant lesions or rashes. Cervical Nodes:  No significant cervical adenopathy. Psych:  Alert and cooperative. Normal affect.  LAB RESULTS:  Recent Labs  10/22/15 1534 10/23/15 0441  WBC 5.3 5.4  HGB 11.1* 10.3*  HCT 30.8* 29.2*  PLT 206 208   BMET  Recent Labs  10/22/15 1533 10/23/15 0441  NA 139 143  K 3.3* 3.4*  CL 101 110  CO2 28 27  GLUCOSE 131* 89  BUN 11 7  CREATININE 0.74 0.63  CALCIUM 9.6 8.7*   LFT  Recent Labs  10/22/15 1533  PROT 7.3  ALBUMIN 4.5  AST 35  ALT <5*  ALKPHOS 94  BILITOT 0.2*   PT/INR No results for input(s): LABPROT, INR in the last 72 hours.  STUDIES: Dg Chest Port 1 View  Result Date: 10/22/2015 CLINICAL DATA:  82 year old female with cough. EXAM: PORTABLE CHEST 1 VIEW COMPARISON:  Chest radiograph dated 05/19/2015 FINDINGS: Single portable view of the chest demonstrates emphysematous changes of the lungs with bibasilar atelectasis/ scarring. No focal consolidation, pleural effusion, or pneumothorax. The cardiac silhouette is within normal limits. No acute osseous pathology identified. IMPRESSION: No focal consolidation. Electronically Signed   By: Anner Crete M.D.   On: 10/22/2015 23:09      Impression / Plan:   Hailey [name] is a 82 y.o. y/o female with who was admitted with intractable nausea and vomiting and a 20 pound weight loss.  The patient was given an enema and states that she had a good bowel movement and has not had any nausea or vomiting since yesterday.  The patient was able to tolerate a complete diet today.  The patient denies any black stools or bloody stools..  The patient does have a history of  Gastric ulcers  that have  bledin the past.  The patient does not have any symptoms of that at the present time. I'm now being asked to see the patient for nausea and vomiting that has resolved. No further GI intervention at this time.  Thank you for involving me in the care of this patient.      LOS: 0 days   Hailey Lame, MD  10/23/2015, 5:28 PM   Note: This dictation was prepared with Dragon dictation along with smaller phrase technology. Any transcriptional errors that result from this process are unintentional.

## 2015-10-24 ENCOUNTER — Observation Stay: Payer: Commercial Managed Care - HMO

## 2015-10-24 DIAGNOSIS — E785 Hyperlipidemia, unspecified: Secondary | ICD-10-CM | POA: Diagnosis not present

## 2015-10-24 DIAGNOSIS — R1110 Vomiting, unspecified: Secondary | ICD-10-CM | POA: Diagnosis not present

## 2015-10-24 DIAGNOSIS — R1011 Right upper quadrant pain: Secondary | ICD-10-CM | POA: Diagnosis not present

## 2015-10-24 DIAGNOSIS — J449 Chronic obstructive pulmonary disease, unspecified: Secondary | ICD-10-CM | POA: Diagnosis not present

## 2015-10-24 DIAGNOSIS — I2510 Atherosclerotic heart disease of native coronary artery without angina pectoris: Secondary | ICD-10-CM | POA: Diagnosis not present

## 2015-10-24 DIAGNOSIS — R112 Nausea with vomiting, unspecified: Secondary | ICD-10-CM | POA: Diagnosis not present

## 2015-10-24 LAB — BASIC METABOLIC PANEL
Anion gap: 6 (ref 5–15)
BUN: 7 mg/dL (ref 6–20)
CO2: 28 mmol/L (ref 22–32)
CREATININE: 0.68 mg/dL (ref 0.44–1.00)
Calcium: 8.9 mg/dL (ref 8.9–10.3)
Chloride: 109 mmol/L (ref 101–111)
GFR calc Af Amer: >60 mL/min (ref >60)
Glucose, Bld: 106 mg/dL — ABNORMAL HIGH (ref 65–99)
Potassium: 3.9 mmol/L (ref 3.5–5.1)
SODIUM: 143 mmol/L (ref 135–145)

## 2015-10-24 LAB — MAGNESIUM: Magnesium: 2.1 mg/dL (ref 1.7–2.4)

## 2015-10-24 MED ORDER — TECHNETIUM TC 99M MEBROFENIN IV KIT
5.3200 | PACK | Freq: Once | INTRAVENOUS | Status: AC | PRN
  Administered 2015-10-24: 5.32 via INTRAVENOUS

## 2015-10-24 MED ORDER — DICYCLOMINE HCL 10 MG PO CAPS: 10.0000 mg | ORAL_CAPSULE | Freq: Three times a day (TID) | ORAL | 0 refills | Status: DC | PRN

## 2015-10-24 MED ORDER — TORSEMIDE 20 MG PO TABS: 20.0000 mg | ORAL_TABLET | Freq: Two times a day (BID) | ORAL | 3 refills | Status: DC

## 2015-10-24 NOTE — Progress Notes (Signed)
[name], MD [facility]   [address] [employer] [place], [place] [address] Phone: [phone] Fax : [phone]   Subjective: The patient reports that she has had no further vomiting or nausea.  The patient ate a good meal yesterday.  She denies any symptoms overnight.   Objective: Vital signs in last 24 hours: Vitals:   10/23/15 0900 10/23/15 1402 10/23/15 2030 10/24/15 0504  BP: (!) 126/41 (!) 120/45 (!) 123/51 (!) 125/37  Pulse: (!) 53 (!) 58 (!) 56 (!) 54  Resp:  18 18 18   Temp:  98.1 F (36.7 C) 98 F (36.7 C) 97.7 F (36.5 C)  TempSrc:  Oral Oral Oral  SpO2:  98% 93% 98%  Weight:      Height:       Weight change:   Intake/Output Summary (Last 24 hours) at 10/24/15 1206 Last data filed at 10/24/15 0800  Gross per 24 hour  Intake              240 ml  Output                0 ml  Net              240 ml     Exam: Heart:: Regular rate and rhythm Lungs: normal Abdomen: soft, nontender, normal bowel sounds   Lab Results: @LABTEST2 @ Micro Results: Recent Results (from the past 240 hour(s))  Urine culture     Status: None   Collection Time: 10/19/15 12:16 PM  Result Value Ref Range Status   Specimen Description URINE, CLEAN CATCH  Final   Special Requests NONE  Final   Culture NO GROWTH Performed at [facility]   Final   Report Status 10/21/2015 FINAL  Final  MRSA PCR Screening     Status: None   Collection Time: 10/22/15 10:00 PM  Result Value Ref Range Status   MRSA by PCR NEGATIVE NEGATIVE Final    Comment:        The GeneXpert MRSA Assay (FDA approved for NASAL specimens only), is one component of a comprehensive MRSA colonization surveillance program. It is not intended to diagnose MRSA infection nor to guide or monitor treatment for MRSA infections.    Studies/Results: Dg Chest Port 1 View  Result Date: 10/22/2015 CLINICAL DATA:  82 year old female with cough. EXAM: PORTABLE CHEST 1 VIEW COMPARISON:  Chest radiograph dated  05/19/2015 FINDINGS: Single portable view of the chest demonstrates emphysematous changes of the lungs with bibasilar atelectasis/ scarring. No focal consolidation, pleural effusion, or pneumothorax. The cardiac silhouette is within normal limits. No acute osseous pathology identified. IMPRESSION: No focal consolidation. Electronically Signed   By: Anner Crete M.D.   On: 10/22/2015 23:09   Medications: I have reviewed the patient's current medications. Scheduled Meds: . amiodarone  200 mg Oral Daily  . Carbidopa-Levodopa ER  1 tablet Oral BID  . docusate sodium  100 mg Oral BID  . DULoxetine  30 mg Oral Daily  . DULoxetine  60 mg Oral Daily  . feeding supplement (ENSURE ENLIVE)  237 mL Oral BID BM  . gabapentin  200 mg Oral QPM  . gabapentin  300 mg Oral q morning - 10a  . gabapentin  400 mg Oral QHS  . heparin  5,000 Units Subcutaneous Q8H  . losartan  25 mg Oral Daily  . mouth rinse  15 mL Mouth Rinse BID  . mometasone-formoterol  2 puff Inhalation BID  . montelukast  10 mg Oral  QHS  . pantoprazole  40 mg Oral Daily  . pravastatin  40 mg Oral q1800  . senna  2 tablet Oral Daily  . sucralfate  1 g Oral TID WC & HS  . tiotropium  18 mcg Inhalation Daily   Continuous Infusions:  PRN Meds:.acetaminophen, albuterol, fluticasone, ondansetron (ZOFRAN) IV, oxyCODONE, polyethylene glycol   Assessment: Principal Problem:   Nausea and vomiting Active Problems:   Uncontrollable vomiting   Protein-calorie malnutrition, severe    Plan: This patient has had intractable nausea that has completely resolved after being admitted to the hospital and having a bowel movement.  The patient is set up for a HIDA scan for today.  Nothing further to do from a GI point of view at this time.   LOS: 0 days   [name] 10/24/2015, 12:06 PM

## 2015-10-24 NOTE — Progress Notes (Signed)
Pt d/c home in company of son via wheelchair. At 2030

## 2015-10-24 NOTE — Progress Notes (Signed)
CC: Abdominal pain Subjective: Patient reports she had return of her pain last night. She did tolerate her diet all day yesterday without nausea or vomiting. She remains convinced that her pain is not related to constipation.  Objective: Vital signs in last 24 hours: Temp:  [97.7 F (36.5 C)-98.1 F (36.7 C)] 97.7 F (36.5 C) (09/09 0504) Pulse Rate:  [54-58] 54 (09/09 0504) Resp:  [18] 18 (09/09 0504) BP: (120-125)/(37-51) 125/37 (09/09 0504) SpO2:  [93 %-98 %] 98 % (09/09 0504) Last BM Date: 10/20/15  Intake/Output from previous day: 09/08 0701 - 09/09 0700 In: 600 [P.O.:600] Out: 0  Intake/Output this shift: No intake/output data recorded.  Physical exam:  Gen.: No acute distress Chest: Clear to auscultation Heart: Regular rhythm Abdomen: Soft, nondistended, mild tenderness to deep palpation in the right lateral abdomen but without rebound or guarding  Lab Results: CBC   Recent Labs  10/22/15 1534 10/23/15 0441  WBC 5.3 5.4  HGB 11.1* 10.3*  HCT 30.8* 29.2*  PLT 206 208   BMET  Recent Labs  10/23/15 0441 10/24/15 0541  NA 143 143  K 3.4* 3.9  CL 110 109  CO2 27 28  GLUCOSE 89 106*  BUN 7 7  CREATININE 0.63 0.68  CALCIUM 8.7* 8.9   PT/INR No results for input(s): LABPROT, INR in the last 72 hours. ABG No results for input(s): PHART, HCO3 in the last 72 hours.  Invalid input(s): PCO2, PO2  Studies/Results: Dg Chest Port 1 View  Result Date: 10/22/2015 CLINICAL DATA:  82 year old female with cough. EXAM: PORTABLE CHEST 1 VIEW COMPARISON:  Chest radiograph dated 05/19/2015 FINDINGS: Single portable view of the chest demonstrates emphysematous changes of the lungs with bibasilar atelectasis/ scarring. No focal consolidation, pleural effusion, or pneumothorax. The cardiac silhouette is within normal limits. No acute osseous pathology identified. IMPRESSION: No focal consolidation. Electronically Signed   By: Anner Crete M.D.   On: 10/22/2015 23:09     Anti-infectives: Anti-infectives    Start     Dose/Rate Route Frequency Ordered Stop   10/22/15 2200  cephALEXin (KEFLEX) capsule 500 mg  Status:  Discontinued     500 mg Oral 3 times daily 10/22/15 2040 10/23/15 1336      Assessment/Plan:  82 year old female multiple medical problems and persistent right-sided abdominal pain. Continues to be an atypical presentation for any surgical diagnosis. HIDA scan has been ordered to better rule in or out biliary pathology. Surgery will continue to follow with you. No plans for operative intervention at this time  Cormick Moss T. Adonis Huguenin, MD, FACS  10/24/2015

## 2015-10-24 NOTE — Care Management Note (Signed)
Case Management Note  Patient Details  Name: AUNE ARIEL MRN: [id number] Date of Birth: [birth year]  Subjective/Objective:   No home health orders but Ms Danner has chronic home oxygen and family will need to bring her portable oxygen tank from home for transport home today.                Action/Plan:   Expected Discharge Date:                  Expected Discharge Plan:     In-House Referral:     Discharge planning Services     Post Acute Care Choice:    Choice offered to:     DME Arranged:    DME Agency:     HH Arranged:    HH Agency:     Status of Service:     If discussed at [employer] of Stay Meetings, dates discussed:    Additional Comments:  Alejandria Wessells A, RN 10/24/2015, 3:55 PM

## 2015-10-26 NOTE — Discharge Summary (Signed)
[facility] NAME: Hailey [name]    MR#:  [id number]  DATE OF BIRTH:  1932/07/30  DATE OF ADMISSION:  10/22/2015   ADMITTING PHYSICIAN: Vaughan Basta, MD  DATE OF DISCHARGE: 10/24/2015  8:35 PM  PRIMARY CARE PHYSICIAN: BABAOFF, MARC E, MD   ADMISSION DIAGNOSIS:   Intractable vomiting with nausea, vomiting of unspecified type [R11.10]  DISCHARGE DIAGNOSIS:   Principal Problem:   Nausea and vomiting Active Problems:   Uncontrollable vomiting   Protein-calorie malnutrition, severe   SECONDARY DIAGNOSIS:   Past Medical History:  Diagnosis Date  . Anemia    Chronic  . Anxiety   . Arthritis   . Asthma   . Atrophic vaginitis   . Cerebral aneurysm    Hx of  . CHF (congestive heart failure) ([place])   . COPD (chronic obstructive pulmonary disease) ([place])   . Diverticulitis   . DVT (deep vein thrombosis) in pregnancy 09/2008   a. LLE, recurrent hx, has IVC filter. Not on coumadin now with history of GI bleeding  . Fibrocystic disease of breast   . Fibromyalgia   . GERD (gastroesophageal reflux disease)   . Gout   . Gross hematuria   . Heart murmur   . History of colonoscopy   . Hyperlipidemia   . Hypertension   . Hypertrophic cardiomyopathy ([place])    a. Echo 4/09 with EF 70-75%, asymmetricy basal septal hypertrophy, mild MR without systolic anterior motion of the mitral valve, LVOT gradient to 130 mmHg with Valsalva b. Echo 7/13: EF 60-65%, mild focal basal septal hypertrophy, no significant LVOT gradient, no MV SAM c. Echo 2/15: EF 55-60%, HOCM, resting LVOT gradient 29 mmHg, Valsalva LVOT gradient > 140 mmHg, mild LVH, mild TR, elevated PASP  . Incomplete bladder emptying   . Migraine headache    Hx of  . Obesity   . OSA (obstructive sleep apnea)    a. Intolerant to CPAP, wears 2L via n/c  . Osteoarthritis    Hx of left TKR  . PAF (paroxysmal atrial fibrillation) ([place])    a. Full-dose ASA alone 2/2 h/o GIB.  Marland Kitchen  PAT (paroxysmal atrial tachycardia) ([place])   . 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 Pneumonia   . PUD (peptic ulcer disease)    With GI bleeding  . Pulmonary embolism ([place])   . Tremor     HOSPITAL COURSE:   82 y/o F with PMH of cerebral aneurysm, CHF, COPD on home oxygen, fibromyalgia, hypertrophic cardiomyopathy, OSA, PAF, peptic ulcer disease presents to the hospital secondary to worsening nausea and vomiting associated with 2-3 week history of right upper quadrant pain.  #1 abdominal pain with nausea and vomiting- A candidate to acute gastroenteritis. -Patient has been having subacute gallbladder issues with right upper quadrant abdominal pain. However the reason for admission was acute gastroenteritis. -Appreciate GI consult and surgical consult. -Her symptoms have resolved in less than 24 hours. She received gentle hydration with IV fluids. -HIDA scan done did not show any acute cholecystitis. She does have called in to dyskinesia and sludge in the gallbladder. Outpatient follow-up with surgery recommended. She was able to tolerate regular diet. -No further workup recommended by GI as well. -Bentyl added for abdominal pain  #2 neuropathy-on gabapentin and Cymbalta  #3 hypertension-on losartan  #4 chronic A. fib-on amiodarone. Not on anticoagulation due to history of GI bleed  #5 COPD-on chronic home oxygen. Stable.  Continue home inhalers.  #6 Parkinson's tremors-continue Sinemet  Patient has been discharged home   DISCHARGE CONDITIONS:   Stable  CONSULTS OBTAINED:   Treatment Team:  [name], MD Clayburn Pert, MD  DRUG ALLERGIES:   Allergies  Allergen Reactions  . Aspirin Other (See Comments)    Reaction:  GI bleeding   . Codeine Nausea And Vomiting  . Ketorolac Tromethamine Other (See Comments)    Reaction:  Headache   . Nsaids Other (See Comments)    Reaction:  GI bleeding   . Phenazopyridine Hcl Other (See  Comments)    Reaction:  Vision problems    DISCHARGE MEDICATIONS:     Medication List    STOP taking these medications   cephALEXin 500 MG capsule Commonly known as:  KEFLEX   nitrofurantoin (macrocrystal-monohydrate) 100 MG capsule Commonly known as:  MACROBID     TAKE these medications   acetaminophen 325 MG tablet Commonly known as:  TYLENOL Take 2 tablets (650 mg total) by mouth every 6 (six) hours as needed for mild pain or moderate pain (temp > 101.5).   albuterol 108 (90 Base) MCG/ACT inhaler Commonly known as:  PROVENTIL HFA;VENTOLIN HFA Inhale 1 puff into the lungs every 6 (six) hours as needed for wheezing or shortness of breath.   albuterol (2.5 MG/3ML) 0.083% nebulizer solution Commonly known as:  PROVENTIL Take 3 mLs (2.5 mg total) by nebulization every 4 (four) hours as needed for wheezing or shortness of breath.   alendronate 70 MG tablet Commonly known as:  FOSAMAX Take 70 mg by mouth once a week. Take with a full glass of water on an empty stomach.   amiodarone 200 MG tablet Commonly known as:  PACERONE Take 1 tablet (200 mg total) by mouth daily. Pt is able to take an additional tablet if needed for breakthrough arrhythmias.   Carbidopa-Levodopa ER 25-100 MG tablet controlled release Commonly known as:  SINEMET CR Take 1 tablet by mouth 2 (two) times daily.   dicyclomine 10 MG capsule Commonly known as:  BENTYL Take 1 capsule (10 mg total) by mouth 3 (three) times daily as needed for spasms.   dimenhyDRINATE 50 MG tablet Commonly known as:  DRAMAMINE Take 25-50 mg by mouth every 6 (six) hours as needed for dizziness.   docusate sodium 100 MG capsule Commonly known as:  COLACE Take 100 mg by mouth 2 (two) times daily as needed for mild constipation.   DULoxetine 30 MG capsule Commonly known as:  CYMBALTA Take 30 mg by mouth daily. Pt takes with a 60mg  capsule.   DULoxetine 60 MG capsule Commonly known as:  CYMBALTA Take 60 mg by mouth daily.  Pt takes with a 30mg  capsule.   feeding supplement (ENSURE ENLIVE) Liqd Take 237 mLs by mouth 2 (two) times daily between meals.   fluticasone 50 MCG/ACT nasal spray Commonly known as:  FLONASE Place 2 sprays into both nostrils daily as needed for rhinitis.   gabapentin 100 MG capsule Commonly known as:  NEURONTIN Take 200-400 mg by mouth 3 (three) times daily. Pt takes three capsules in the morning, two capsules in the evening, and four capsules at bedtime.   losartan 50 MG tablet Commonly known as:  COZAAR TAKE ONE-HALF (1/2) TABLET (25MG ) BY MOUTH DAILY   lovastatin 40 MG tablet Commonly known as:  MEVACOR Take 40 mg by mouth at bedtime.   mometasone-formoterol 100-5 MCG/ACT Aero Commonly known as:  DULERA Inhale 2 puffs into the lungs 2 (two)  times daily.   montelukast 10 MG tablet Commonly known as:  SINGULAIR Take 10 mg by mouth at bedtime.   oxyCODONE 5 MG immediate release tablet Commonly known as:  Oxy IR/ROXICODONE Take 1 tablet (5 mg total) by mouth every 6 (six) hours as needed for moderate pain or severe pain.   pantoprazole 40 MG tablet Commonly known as:  PROTONIX Take 40 mg by mouth daily.   polyethylene glycol packet Commonly known as:  MIRALAX / GLYCOLAX Take 17 g by mouth daily as needed for moderate constipation.   potassium chloride 10 MEQ tablet Commonly known as:  K-DUR,KLOR-CON Take 2 tablets (20 mEq total) by mouth 2 (two) times daily.   senna 8.6 MG Tabs tablet Commonly known as:  SENOKOT Take 2 tablets (17.2 mg total) by mouth 2 (two) times daily.   tiotropium 18 MCG inhalation capsule Commonly known as:  SPIRIVA Place 18 mcg into inhaler and inhale daily.   torsemide 20 MG tablet Commonly known as:  DEMADEX Take 1 tablet (20 mg total) by mouth 2 (two) times daily. What changed:  how much to take  when to take this  reasons to take this   triamcinolone ointment 0.1 % Commonly known as:  KENALOG Apply 1 application topically  2 (two) times daily as needed (for rash).        DISCHARGE INSTRUCTIONS:   1. PCP f/u in 1-2 weeks  DIET:   Cardiac diet  ACTIVITY:   Activity as tolerated  OXYGEN:   Home Oxygen: 2L chronic  Oxygen Delivery: 2 liters/min via Patient connected to nasal cannula oxygen  DISCHARGE LOCATION:   home   If you experience worsening of your admission symptoms, develop shortness of breath, life threatening emergency, suicidal or homicidal thoughts you must seek medical attention immediately by calling 911 or calling your MD immediately  if symptoms less severe.  You Must read complete instructions/literature along with all the possible adverse reactions/side effects for all the Medicines you take and that have been prescribed to you. Take any new Medicines after you have completely understood and accpet all the possible adverse reactions/side effects.   Please note  You were cared for by a hospitalist during your hospital stay. If you have any questions about your discharge medications or the care you received while you were in the hospital after you are discharged, you can call the unit and asked to speak with the hospitalist on call if the hospitalist that took care of you is not available. Once you are discharged, your primary care physician will handle any further medical issues. Please note that NO REFILLS for any discharge medications will be authorized once you are discharged, as it is imperative that you return to your primary care physician (or establish a relationship with a primary care physician if you do not have one) for your aftercare needs so that they can reassess your need for medications and monitor your lab values.    On the day of Discharge:  VITAL SIGNS:   Blood pressure (!) 114/58, pulse (!) 59, temperature 97.8 F (36.6 C), temperature source Oral, resp. rate 18, height 5\' 1"  (1.549 m), weight 76.2 kg (168 lb), SpO2 95 %.  PHYSICAL EXAMINATION:    GENERAL:   82 y.o.-year-old patient lying in the bed with no acute distress.  EYES: Pupils equal, round, reactive to light and accommodation. No scleral icterus. Extraocular muscles intact.  HEENT: Head atraumatic, normocephalic. Oropharynx and nasopharynx clear.  NECK:  Supple, no  jugular venous distention. No thyroid enlargement, no tenderness.  LUNGS: Normal breath sounds bilaterally, no wheezing, rales,rhonchi or crepitation. No use of accessory muscles of respiration.  CARDIOVASCULAR: S1, S2 normal. No murmurs, rubs, or gallops.  ABDOMEN: Soft, RUQ discomfort on deep palpation, nondistended. Bowel sounds present. No organomegaly or mass.  EXTREMITIES: No pedal edema, cyanosis, or clubbing.  NEUROLOGIC: Cranial nerves II through XII are intact. Muscle strength 5/5 in all extremities. Sensation intact. Gait not checked.  PSYCHIATRIC: The patient is alert and oriented x 3.  SKIN: No obvious rash, lesion, or ulcer.   DATA REVIEW:   CBC  Recent Labs Lab 10/23/15 0441  WBC 5.4  HGB 10.3*  HCT 29.2*  PLT 208    Chemistries   Recent Labs Lab 10/22/15 1533  10/24/15 0541  NA 139  < > 143  K 3.3*  < > 3.9  CL 101  < > 109  CO2 28  < > 28  GLUCOSE 131*  < > 106*  BUN 11  < > 7  CREATININE 0.74  < > 0.68  CALCIUM 9.6  < > 8.9  MG  --   --  2.1  AST 35  --   --   ALT <5*  --   --   ALKPHOS 94  --   --   BILITOT 0.2*  --   --   < > = values in this interval not displayed.   Microbiology Results  Results for orders placed or performed during the hospital encounter of 10/22/15  MRSA PCR Screening     Status: None   Collection Time: 10/22/15 10:00 PM  Result Value Ref Range Status   MRSA by PCR NEGATIVE NEGATIVE Final    Comment:        The GeneXpert MRSA Assay (FDA approved for NASAL specimens only), is one component of a comprehensive MRSA colonization surveillance program. It is not intended to diagnose MRSA infection nor to guide or monitor treatment for MRSA infections.       RADIOLOGY:  No results found.   Management plans discussed with the patient, family and they are in agreement.  CODE STATUS:  Code Status History    Date Active Date Inactive Code Status Order ID Comments User Context   10/22/2015  8:40 PM 10/24/2015 11:40 PM Full Code [id number]  Vaughan Basta, MD Inpatient   05/21/2015 10:34 AM 05/25/2015  5:36 PM DNR [id number]  Flora Lipps, MD Inpatient   05/19/2015  4:56 PM 05/21/2015 10:34 AM Full Code [id number]  Flora Lipps, MD ED   01/27/2015  5:23 PM 01/28/2015  4:52 PM Full Code [id number]  [name], MD ED    Advance Directive Documentation   Flowsheet Row Most Recent Value  Type of Advance Directive  Living will, Healthcare Power of Attorney  Pre-existing out of facility DNR order (yellow form or pink MOST form)  No data  "MOST" Form in Place?  No data      TOTAL TIME TAKING CARE OF THIS PATIENT: 37 minutes.    Gladstone Lighter M.D on 10/26/2015 at 6:21 PM  Between 7am to 6pm - Pager - [phone]  After 6pm go to [email or web address] - Proofreader  [facility]  Office  [phone]  CC: Primary care physician; BABAOFF, Caryl Bis, MD   Note: This dictation was prepared with Dragon dictation along with smaller phrase technology. Any transcriptional errors that result from this process are unintentional.

## 2015-11-08 DIAGNOSIS — J449 Chronic obstructive pulmonary disease, unspecified: Secondary | ICD-10-CM | POA: Diagnosis not present

## 2015-11-08 DIAGNOSIS — Z96659 Presence of unspecified artificial knee joint: Secondary | ICD-10-CM | POA: Diagnosis not present

## 2015-11-08 DIAGNOSIS — R0689 Other abnormalities of breathing: Secondary | ICD-10-CM | POA: Diagnosis not present

## 2015-11-08 DIAGNOSIS — M5136 Other intervertebral disc degeneration, lumbar region: Secondary | ICD-10-CM | POA: Diagnosis not present

## 2015-11-10 DIAGNOSIS — Z23 Encounter for immunization: Secondary | ICD-10-CM | POA: Diagnosis not present

## 2015-11-10 DIAGNOSIS — E440 Moderate protein-calorie malnutrition: Secondary | ICD-10-CM | POA: Diagnosis not present

## 2015-11-10 DIAGNOSIS — R110 Nausea: Secondary | ICD-10-CM | POA: Diagnosis not present

## 2015-11-10 DIAGNOSIS — R634 Abnormal weight loss: Secondary | ICD-10-CM | POA: Diagnosis not present

## 2015-11-12 DIAGNOSIS — R1013 Epigastric pain: Secondary | ICD-10-CM | POA: Diagnosis not present

## 2015-11-12 DIAGNOSIS — K862 Cyst of pancreas: Secondary | ICD-10-CM | POA: Diagnosis not present

## 2015-11-12 DIAGNOSIS — K649 Unspecified hemorrhoids: Secondary | ICD-10-CM | POA: Diagnosis not present

## 2015-11-12 DIAGNOSIS — R634 Abnormal weight loss: Secondary | ICD-10-CM | POA: Diagnosis not present

## 2015-11-20 MED ORDER — LIDOCAINE HCL (PF) 1 % IJ SOLN
INTRAMUSCULAR | Status: AC
  Filled 2015-11-20: qty 30

## 2015-11-23 ENCOUNTER — Encounter: Payer: Self-pay | Admitting: *Deleted

## 2015-11-23 ENCOUNTER — Telehealth: Payer: Self-pay | Admitting: Cardiovascular Disease

## 2015-11-23 NOTE — Telephone Encounter (Signed)
Received STAT cardiac clearance request for pt to proceed w/ EKG on 11/24/15 w/ Dr.Skulskie. Pt will be receiving propofol for sedation.  Please route clearance to [facility] @ [phone], Attn: April Webster, CMA.

## 2015-11-23 NOTE — Telephone Encounter (Signed)
Routed to fax # provided. 

## 2015-11-23 NOTE — Telephone Encounter (Signed)
Acceptable risk for EGD No further testing needed

## 2015-11-24 ENCOUNTER — Ambulatory Visit
Admission: RE | Admit: 2015-11-24 | Discharge: 2015-11-24 | Disposition: A | Discharge status: Home / Self Care | Payer: Commercial Managed Care - HMO | Source: Ambulatory Visit | Attending: Gastroenterology | Admitting: Gastroenterology

## 2015-11-24 ENCOUNTER — Ambulatory Visit: Payer: Commercial Managed Care - HMO | Admitting: Anesthesiology

## 2015-11-24 ENCOUNTER — Encounter
Admission: RE | Disposition: A | Discharge status: Home / Self Care | Payer: Self-pay | Source: Ambulatory Visit | Attending: Gastroenterology

## 2015-11-24 ENCOUNTER — Encounter: Payer: Self-pay | Admitting: Anesthesiology

## 2015-11-24 DIAGNOSIS — B379 Candidiasis, unspecified: Secondary | ICD-10-CM | POA: Diagnosis not present

## 2015-11-24 DIAGNOSIS — R112 Nausea with vomiting, unspecified: Secondary | ICD-10-CM | POA: Diagnosis not present

## 2015-11-24 DIAGNOSIS — K2950 Unspecified chronic gastritis without bleeding: Secondary | ICD-10-CM | POA: Diagnosis not present

## 2015-11-24 DIAGNOSIS — I422 Other hypertrophic cardiomyopathy: Secondary | ICD-10-CM | POA: Insufficient documentation

## 2015-11-24 DIAGNOSIS — D131 Benign neoplasm of stomach: Secondary | ICD-10-CM | POA: Diagnosis not present

## 2015-11-24 DIAGNOSIS — K3189 Other diseases of stomach and duodenum: Secondary | ICD-10-CM | POA: Insufficient documentation

## 2015-11-24 DIAGNOSIS — M797 Fibromyalgia: Secondary | ICD-10-CM | POA: Diagnosis not present

## 2015-11-24 DIAGNOSIS — K30 Functional dyspepsia: Secondary | ICD-10-CM | POA: Diagnosis not present

## 2015-11-24 DIAGNOSIS — K224 Dyskinesia of esophagus: Secondary | ICD-10-CM | POA: Diagnosis not present

## 2015-11-24 DIAGNOSIS — B3781 Candidal esophagitis: Secondary | ICD-10-CM | POA: Diagnosis not present

## 2015-11-24 DIAGNOSIS — Z86711 Personal history of pulmonary embolism: Secondary | ICD-10-CM | POA: Insufficient documentation

## 2015-11-24 DIAGNOSIS — Z79899 Other long term (current) drug therapy: Secondary | ICD-10-CM | POA: Insufficient documentation

## 2015-11-24 DIAGNOSIS — Z86718 Personal history of other venous thrombosis and embolism: Secondary | ICD-10-CM | POA: Insufficient documentation

## 2015-11-24 DIAGNOSIS — R1012 Left upper quadrant pain: Secondary | ICD-10-CM | POA: Insufficient documentation

## 2015-11-24 DIAGNOSIS — K2960 Other gastritis without bleeding: Secondary | ICD-10-CM | POA: Diagnosis not present

## 2015-11-24 DIAGNOSIS — R634 Abnormal weight loss: Secondary | ICD-10-CM | POA: Diagnosis not present

## 2015-11-24 DIAGNOSIS — I110 Hypertensive heart disease with heart failure: Secondary | ICD-10-CM | POA: Insufficient documentation

## 2015-11-24 DIAGNOSIS — Z96652 Presence of left artificial knee joint: Secondary | ICD-10-CM | POA: Insufficient documentation

## 2015-11-24 DIAGNOSIS — K228 Other specified diseases of esophagus: Secondary | ICD-10-CM | POA: Diagnosis not present

## 2015-11-24 DIAGNOSIS — G4733 Obstructive sleep apnea (adult) (pediatric): Secondary | ICD-10-CM | POA: Diagnosis not present

## 2015-11-24 DIAGNOSIS — J449 Chronic obstructive pulmonary disease, unspecified: Secondary | ICD-10-CM | POA: Diagnosis not present

## 2015-11-24 DIAGNOSIS — K317 Polyp of stomach and duodenum: Secondary | ICD-10-CM | POA: Diagnosis not present

## 2015-11-24 DIAGNOSIS — Z6831 Body mass index (BMI) 31.0-31.9, adult: Secondary | ICD-10-CM | POA: Diagnosis not present

## 2015-11-24 DIAGNOSIS — R1013 Epigastric pain: Secondary | ICD-10-CM | POA: Diagnosis not present

## 2015-11-24 DIAGNOSIS — Z792 Long term (current) use of antibiotics: Secondary | ICD-10-CM | POA: Insufficient documentation

## 2015-11-24 DIAGNOSIS — K219 Gastro-esophageal reflux disease without esophagitis: Secondary | ICD-10-CM | POA: Diagnosis not present

## 2015-11-24 DIAGNOSIS — I480 Paroxysmal atrial fibrillation: Secondary | ICD-10-CM | POA: Insufficient documentation

## 2015-11-24 DIAGNOSIS — K2970 Gastritis, unspecified, without bleeding: Secondary | ICD-10-CM | POA: Diagnosis not present

## 2015-11-24 DIAGNOSIS — I509 Heart failure, unspecified: Secondary | ICD-10-CM | POA: Diagnosis not present

## 2015-11-24 DIAGNOSIS — E669 Obesity, unspecified: Secondary | ICD-10-CM | POA: Insufficient documentation

## 2015-11-24 DIAGNOSIS — Z7951 Long term (current) use of inhaled steroids: Secondary | ICD-10-CM | POA: Insufficient documentation

## 2015-11-24 DIAGNOSIS — E785 Hyperlipidemia, unspecified: Secondary | ICD-10-CM | POA: Insufficient documentation

## 2015-11-24 DIAGNOSIS — D132 Benign neoplasm of duodenum: Secondary | ICD-10-CM | POA: Diagnosis not present

## 2015-11-24 DIAGNOSIS — K210 Gastro-esophageal reflux disease with esophagitis: Secondary | ICD-10-CM | POA: Insufficient documentation

## 2015-11-24 DIAGNOSIS — Z7983 Long term (current) use of bisphosphonates: Secondary | ICD-10-CM | POA: Insufficient documentation

## 2015-11-24 DIAGNOSIS — Z87891 Personal history of nicotine dependence: Secondary | ICD-10-CM | POA: Insufficient documentation

## 2015-11-24 DIAGNOSIS — Z9981 Dependence on supplemental oxygen: Secondary | ICD-10-CM | POA: Insufficient documentation

## 2015-11-24 HISTORY — PX: ESOPHAGOGASTRODUODENOSCOPY (EGD) WITH PROPOFOL: SHX5813

## 2015-11-24 SURGERY — ESOPHAGOGASTRODUODENOSCOPY (EGD) WITH PROPOFOL
Anesthesia: General

## 2015-11-24 MED ORDER — SODIUM CHLORIDE 0.9 % IV SOLN
INTRAVENOUS | Status: DC
  Administered 2015-11-24: 1000 mL via INTRAVENOUS
  Administered 2015-11-24: 16:00:00 via INTRAVENOUS

## 2015-11-24 MED ORDER — PROPOFOL 10 MG/ML IV BOLUS
INTRAVENOUS | Status: DC | PRN
  Administered 2015-11-24: 70 mg via INTRAVENOUS
  Administered 2015-11-24: 20 mg via INTRAVENOUS
  Administered 2015-11-24 (×5): 10 mg via INTRAVENOUS

## 2015-11-24 MED ORDER — IPRATROPIUM-ALBUTEROL 0.5-2.5 (3) MG/3ML IN SOLN
3.0000 mL | Freq: Once | RESPIRATORY_TRACT | Status: AC
  Administered 2015-11-24: 3 mL via RESPIRATORY_TRACT

## 2015-11-24 MED ORDER — SODIUM CHLORIDE 0.9 % IV SOLN: INTRAVENOUS | Status: DC

## 2015-11-24 MED ORDER — IPRATROPIUM-ALBUTEROL 0.5-2.5 (3) MG/3ML IN SOLN
RESPIRATORY_TRACT | Status: AC
  Administered 2015-11-24: 3 mL via RESPIRATORY_TRACT
  Filled 2015-11-24: qty 3

## 2015-11-24 NOTE — Anesthesia Preprocedure Evaluation (Addendum)
Anesthesia Evaluation  Patient identified by MRN, date of birth, ID band Patient awake    Reviewed: Allergy & Precautions, H&P , NPO status , Patient's Chart, lab work & pertinent test results  History of Anesthesia Complications Negative for: history of anesthetic complications  Airway Mallampati: III  TM Distance: <3 FB Neck ROM: limited    Dental  (+) Poor Dentition, Missing, Upper Dentures, Lower Dentures, Implants   Pulmonary asthma , sleep apnea and Oxygen sleep apnea , pneumonia, resolved, COPD,  oxygen dependent, former smoker,    Pulmonary exam normal breath sounds clear to auscultation       Cardiovascular Exercise Tolerance: Poor hypertension, (-) angina+ Peripheral Vascular Disease, +CHF and + DOE  Normal cardiovascular exam+ Valvular Problems/Murmurs  Rhythm:regular Rate:Normal     Neuro/Psych  Headaches, PSYCHIATRIC DISORDERS Anxiety  Neuromuscular disease negative neurological ROS  negative psych ROS   GI/Hepatic negative GI ROS, Neg liver ROS, PUD, GERD  ,  Endo/Other  negative endocrine ROS  Renal/GU negative Renal ROS  negative genitourinary   Musculoskeletal  (+) Arthritis , Fibromyalgia -  Abdominal   Peds  Hematology negative hematology ROS (+)   Anesthesia Other Findings Patient has cardiac clearance for this procedure.   Past Medical History: No date: Anemia     Comment: Chronic No date: Anxiety No date: Arthritis No date: Asthma No date: Atrophic vaginitis No date: Cerebral aneurysm     Comment: Hx of No date: CHF (congestive heart failure) (HCC) No date: COPD (chronic obstructive pulmonary disease) (* No date: Diverticulitis 09/2008: DVT (deep vein thrombosis) in pregnancy ([place])     Comment: a. LLE, recurrent hx, has IVC filter. Not on               coumadin now with history of GI bleeding No date: Fibrocystic disease of breast No date: Fibromyalgia No date: GERD  (gastroesophageal reflux disease) No date: Gout No date: Gross hematuria No date: Heart murmur No date: History of colonoscopy No date: Hyperlipidemia No date: Hypertension No date: Hypertrophic cardiomyopathy ([place])     Comment: a. Echo 4/09 with EF 70-75%, asymmetricy basal              septal hypertrophy, mild MR without systolic               anterior motion of the mitral valve, LVOT               gradient to 130 mmHg with Valsalva b. Echo               7/13: EF 60-65%, mild focal basal septal               hypertrophy, no significant LVOT gradient, no               MV SAM c. Echo 2/15: EF 55-60%, HOCM, resting               LVOT gradient 29 mmHg, Valsalva LVOT gradient >              140 mmHg, mild LVH, mild TR, elevated PASP No date: Incomplete bladder emptying No date: Migraine headache     Comment: Hx of No date: Obesity No date: OSA (obstructive sleep apnea)     Comment: a. Intolerant to CPAP, wears 2L via n/c No date: Osteoarthritis     Comment: Hx of left TKR No date: PAF (paroxysmal atrial fibrillation) (HCC)     Comment: a. Full-dose ASA alone  2/2 h/o GIB. No date: PAT (paroxysmal atrial tachycardia) (HCC) No date: Pleomorphic small or medium-sized cell cutaneo* No date: Pneumonia No date: PUD (peptic ulcer disease)     Comment: With GI bleeding No date: Pulmonary embolism (HCC) No date: Tremor  Past Surgical History: No date: ABDOMINAL HYSTERECTOMY No date: APPENDECTOMY No date: BLADDER SUSPENSION 2009: CARDIAC CATHETERIZATION     Comment: No significant CAD No date: CARDIOVASCULAR STRESS TEST     Comment: a. Lexiscan Myoview 3/14: EF 76%, no evidence               of ischemia or WMAs No date: cataract surgery 1998: CEREBRAL ANEURYSM REPAIR No date: FOOT SURGERY     Comment: Bilateral 1968: INGUINAL HERNIA REPAIR     Comment: Right 2009: KNEE ARTHROSCOPY     Comment: Right 1972: SHOULDER SURGERY     Comment: Right   Left 2012 No date: TONSILLECTOMY  AND ADENOIDECTOMY No date: TOTAL KNEE ARTHROPLASTY     Comment: Left  BMI    Body Mass Index:  31.74 kg/m      Reproductive/Obstetrics negative OB ROS                            Anesthesia Physical Anesthesia Plan  ASA: IV  Anesthesia Plan: General   Post-op Pain Management:    Induction:   Airway Management Planned:   Additional Equipment:   Intra-op Plan:   Post-operative Plan:   Informed Consent: I have reviewed the patients History and Physical, chart, labs and discussed the procedure including the risks, benefits and alternatives for the proposed anesthesia with the patient or authorized representative who has indicated his/her understanding and acceptance.   Dental Advisory Given  Plan Discussed with: Anesthesiologist, CRNA and Surgeon  Anesthesia Plan Comments: (Patient informed that they are higher risk for complications from anesthesia during this procedure due to their medical history.  Patient voiced understanding. )       Anesthesia Quick Evaluation

## 2015-11-24 NOTE — Transfer of Care (Signed)
Immediate Anesthesia Transfer of Care Note  Patient: Hailey [name]  Procedure(s) Performed: Procedure(s): ESOPHAGOGASTRODUODENOSCOPY (EGD) WITH PROPOFOL (N/A)  Patient Location: PACU and Endoscopy Unit  Anesthesia Type:General  Level of Consciousness: awake, alert  and oriented  Airway & Oxygen Therapy: Patient Spontanous Breathing and Patient connected to nasal cannula oxygen  Post-op Assessment: Report given to RN and Post -op Vital signs reviewed and stable  Post vital signs: Reviewed and stable  Last Vitals:  Vitals:   11/24/15 1447 11/24/15 1659  BP: (!) 130/45 (!) 125/48  Pulse: 66 (!) 56  Resp: 18 15  Temp: 36.8 C 36.3 C    Last Pain:  Vitals:   11/24/15 1659  TempSrc: Tympanic         Complications: No apparent anesthesia complications

## 2015-11-24 NOTE — Op Note (Signed)
[facility] Gastroenterology Patient Name: Hailey [name] Procedure Date: 11/24/2015 4:12 PM MRN: [id number] Account #: [id number] Date of Birth: Feb 20, 1932 Admit Type: Outpatient Age: 82 Room: [facility] ENDO ROOM 3 Gender: Female Note Status: Finalized Procedure:            Upper GI endoscopy Indications:          Epigastric abdominal pain, Abdominal pain in the left                        upper quadrant, Nausea with vomiting, Weight loss Providers:            Lollie Sails, MD Referring MD:         Marga Hoots NP (Referring MD) Medicines:            Monitored Anesthesia Care Complications:        No immediate complications. Procedure:            Pre-Anesthesia Assessment:                       - ASA Grade Assessment: III - A patient with severe                        systemic disease.                       After obtaining informed consent, the endoscope was                        passed under direct vision. Throughout the procedure,                        the patient's blood pressure, pulse, and oxygen                        saturations were monitored continuously. The Endoscope                        was introduced through the mouth, and advanced to the                        third part of duodenum. The upper GI endoscopy was                        accomplished without difficulty. The patient tolerated                        the procedure well. The patient tolerated the procedure                        well. Findings:      Diffuse minimal candidiasis was found in the middle third of the       esophagus and in the lower third of the esophagus.      Abnormal motility was noted in the middle third of the esophagus and in       the lower third of the esophagus. The cricopharyngeus was normal. There       is intermittant feline appearance of the esophageal body. Tertiary       peristaltic waves are noted.      The Z-line was variable. Biopsies were taken  with  a cold forceps for       histology.      Localized mild mucosal variance characterized by congestion and       discoloration was found in the gastric antrum and in the prepyloric       region of the stomach. Biopsies were taken with a cold forceps for       histology.      -biopsies were also taken of the body of the stomach for histology.      The cardia and gastric fundus were normal on retroflexion.      A single 1 mm sessile polyp with no bleeding was found in the third       portion of the duodenum. The polyp was removed with a cold biopsy       forceps. Resection and retrieval were complete.      The examined duodenum was normal otherwise.      Biopsies were taken with a cold forceps for histology. Impression:           - Monilial esophagitis.                       - Abnormal esophageal motility, suspicious for                        presbyesophagus.                       - Z-line variable. Biopsied.                       - Gastric mucosal variant. Biopsied.                       - A single duodenal polyp. Resected and retrieved.                       - Normal examined duodenum.                       - Biopsies were taken with a cold forceps for histology. Recommendation:       - Use sucralfate suspension 1 gram PO QID for 1 month.                       - Return to GI clinic in 4 weeks.                       - Await pathology results.                       - Continue present medications. Procedure Code(s):    --- Professional ---                       [phone], Esophagogastroduodenoscopy, flexible, transoral;                        with biopsy, single or multiple Diagnosis Code(s):    --- Professional ---                       B37.81, Candidal esophagitis                       K22.4, Dyskinesia of  esophagus                       K22.8, Other specified diseases of esophagus                       K31.89, Other diseases of stomach and duodenum                       K31.7, Polyp of  stomach and duodenum                       R10.13, Epigastric pain                       R10.12, Left upper quadrant pain                       R11.2, Nausea with vomiting, unspecified                       R63.4, Abnormal weight loss CPT copyright 2016 American Medical Association. All rights reserved. The codes documented in this report are preliminary and upon coder review may  be revised to meet current compliance requirements. Lollie Sails, MD 11/24/2015 5:03:22 PM This report has been signed electronically. Number of Addenda: 0 Note Initiated On: 11/24/2015 4:12 PM      [facility]

## 2015-11-24 NOTE — H&P (Signed)
Outpatient short stay form Pre-procedure 11/24/2015 4:26 PM Lollie Sails MD  Primary Physician: Dr Derinda Late  Reason for visit:  EGD  History of present illness:  Patient is a 82 year old female presenting today for EGD. She states she has been having problems with nausea and vomiting for at least a couple of months. She is been having epigastric discomfort sometimes in the left upper quadrant sometimes right upper arm. It does not seem to radiate through to her back. She has had some weight loss since feels this is all affected her appetite. Previously she was taking NSAIDs for her arthritic issues. That has been discontinued and she has been taking a proton pump inhibitor once a day. She does have a history of erosive gastritis from several years ago in 2014. There is no previous history of H. pylori or Barrett's esophagus.  Patient does have a history of a pancreatic cyst that has been stable. She does occasionally have some back pain.    Current Facility-Administered Medications:  .  0.9 %  sodium chloride infusion, , Intravenous, Continuous, Lollie Sails, MD, Last Rate: 20 mL/hr at 11/24/15 1515 .  0.9 %  sodium chloride infusion, , Intravenous, Continuous, Lollie Sails, MD  Prescriptions Prior to Admission  Medication Sig Dispense Refill Last Dose  . albuterol (PROVENTIL HFA;VENTOLIN HFA) 108 (90 Base) MCG/ACT inhaler Inhale 1 puff into the lungs every 6 (six) hours as needed for wheezing or shortness of breath.   prn at prn  . albuterol (PROVENTIL) (2.5 MG/3ML) 0.083% nebulizer solution Take 3 mLs (2.5 mg total) by nebulization every 4 (four) hours as needed for wheezing or shortness of breath. 75 mL 12 Not Taking at Unknown time  . alendronate (FOSAMAX) 70 MG tablet Take 70 mg by mouth once a week. Take with a full glass of water on an empty stomach.   Past Week at Unknown time  . amiodarone (PACERONE) 200 MG tablet Take 1 tablet (200 mg total) by mouth daily. Pt is  able to take an additional tablet if needed for breakthrough arrhythmias. 30 tablet 6 11/24/2015 at 0700  . Carbidopa-Levodopa ER (SINEMET CR) 25-100 MG tablet controlled release Take 1 tablet by mouth 2 (two) times daily. 60 tablet 0 11/24/2015 at 0700  . DULoxetine (CYMBALTA) 30 MG capsule Take 30 mg by mouth daily. Pt takes with a 60mg  capsule.   11/23/2015 at Unknown time  . DULoxetine (CYMBALTA) 60 MG capsule Take 60 mg by mouth daily. Pt takes with a 30mg  capsule.   11/23/2015 at Unknown time  . fluticasone (FLONASE) 50 MCG/ACT nasal spray Place 2 sprays into both nostrils daily as needed for rhinitis.   11/23/2015 at Unknown time  . gabapentin (NEURONTIN) 100 MG capsule Take 200-400 mg by mouth 3 (three) times daily. Pt takes three capsules in the morning, two capsules in the evening, and four capsules at bedtime.   11/23/2015 at Unknown time  . levothyroxine (SYNTHROID, LEVOTHROID) 75 MCG tablet Take 75 mcg by mouth daily before breakfast.   11/24/2015 at 0700  . losartan (COZAAR) 50 MG tablet TAKE ONE-HALF (1/2) TABLET (25MG ) BY MOUTH DAILY 30 tablet 3 11/24/2015 at 0700  . lovastatin (MEVACOR) 40 MG tablet Take 40 mg by mouth at bedtime.   11/23/2015 at Unknown time  . mometasone-formoterol (DULERA) 100-5 MCG/ACT AERO Inhale 2 puffs into the lungs 2 (two) times daily. 1 Inhaler 0 11/23/2015 at Unknown time  . montelukast (SINGULAIR) 10 MG tablet Take 10 mg  by mouth at bedtime.   11/23/2015 at Unknown time  . nitrofurantoin, macrocrystal-monohydrate, (MACROBID) 100 MG capsule Take 100 mg by mouth 2 (two) times daily.   11/23/2015 at Unknown time  . ondansetron (ZOFRAN-ODT) 4 MG disintegrating tablet Take 4 mg by mouth every 8 (eight) hours as needed for nausea or vomiting.   11/24/2015 at 0700  . oxyCODONE (OXY IR/ROXICODONE) 5 MG immediate release tablet Take 1 tablet (5 mg total) by mouth every 6 (six) hours as needed for moderate pain or severe pain. 30 tablet 0 Past Month  . pantoprazole  (PROTONIX) 40 MG tablet Take 40 mg by mouth daily.   11/23/2015 at Unknown time  . potassium chloride (K-DUR,KLOR-CON) 10 MEQ tablet Take 2 tablets (20 mEq total) by mouth 2 (two) times daily. 120 tablet 3 11/23/2015 at Unknown time  . senna (SENOKOT) 8.6 MG TABS tablet Take 2 tablets (17.2 mg total) by mouth 2 (two) times daily. 120 each 0 11/23/2015 at Unknown time  . tiotropium (SPIRIVA) 18 MCG inhalation capsule Place 18 mcg into inhaler and inhale daily.   11/23/2015 at Unknown time  . triamcinolone ointment (KENALOG) 0.1 % Apply 1 application topically 2 (two) times daily as needed (for rash).    Not Taking at Unknown time  . acetaminophen (TYLENOL) 325 MG tablet Take 2 tablets (650 mg total) by mouth every 6 (six) hours as needed for mild pain or moderate pain (temp > 101.5).   10/22/2015 at 0100  . dicyclomine (BENTYL) 10 MG capsule Take 1 capsule (10 mg total) by mouth 3 (three) times daily as needed for spasms. (Patient not taking: Reported on 11/24/2015) 30 capsule 0 Not Taking at Unknown time  . dimenhyDRINATE (DRAMAMINE) 50 MG tablet Take 25-50 mg by mouth every 6 (six) hours as needed for dizziness.    Not Taking at Unknown time  . docusate sodium (COLACE) 100 MG capsule Take 100 mg by mouth 2 (two) times daily as needed for mild constipation.   10/21/2015 at 0800  . feeding supplement, ENSURE ENLIVE, (ENSURE ENLIVE) LIQD Take 237 mLs by mouth 2 (two) times daily between meals. (Patient not taking: Reported on 10/22/2015) 237 mL 12 Not Taking at Unknown time  . polyethylene glycol (MIRALAX / GLYCOLAX) packet Take 17 g by mouth daily as needed for moderate constipation.    10/22/2015 at Unknown time  . torsemide (DEMADEX) 20 MG tablet Take 1 tablet (20 mg total) by mouth 2 (two) times daily. 90 tablet 3      Allergies  Allergen Reactions  . Aspirin Other (See Comments)    Reaction:  GI bleeding   . Codeine Nausea And Vomiting  . Compazine [Prochlorperazine] Other (See Comments)    Confusion  .  Ketorolac Tromethamine Other (See Comments)    Reaction:  Headache   . Nsaids Other (See Comments)    Reaction:  GI bleeding   . Phenazopyridine Hcl Other (See Comments)    Reaction:  Vision problems   . Macrodantin [Nitrofurantoin Macrocrystal] Rash     Past Medical History:  Diagnosis Date  . Anemia    Chronic  . Anxiety   . Arthritis   . Asthma   . Atrophic vaginitis   . Cerebral aneurysm    Hx of  . CHF (congestive heart failure) ([place])   . COPD (chronic obstructive pulmonary disease) ([place])   . Diverticulitis   . DVT (deep vein thrombosis) in pregnancy ([place]) 09/2008   a. LLE, recurrent hx, has IVC  filter. Not on coumadin now with history of GI bleeding  . Fibrocystic disease of breast   . Fibromyalgia   . GERD (gastroesophageal reflux disease)   . Gout   . Gross hematuria   . Heart murmur   . History of colonoscopy   . Hyperlipidemia   . Hypertension   . Hypertrophic cardiomyopathy ([place])    a. Echo 4/09 with EF 70-75%, asymmetricy basal septal hypertrophy, mild MR without systolic anterior motion of the mitral valve, LVOT gradient to 130 mmHg with Valsalva b. Echo 7/13: EF 60-65%, mild focal basal septal hypertrophy, no significant LVOT gradient, no MV SAM c. Echo 2/15: EF 55-60%, HOCM, resting LVOT gradient 29 mmHg, Valsalva LVOT gradient > 140 mmHg, mild LVH, mild TR, elevated PASP  . Incomplete bladder emptying   . Migraine headache    Hx of  . Obesity   . OSA (obstructive sleep apnea)    a. Intolerant to CPAP, wears 2L via n/c  . Osteoarthritis    Hx of left TKR  . PAF (paroxysmal atrial fibrillation) ([place])    a. Full-dose ASA alone 2/2 h/o GIB.  Marland Kitchen PAT (paroxysmal atrial tachycardia) ([place])   . 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 Pneumonia   . PUD (peptic ulcer disease)    With GI bleeding  . Pulmonary embolism ([place])   . Tremor     Review of systems:      Physical Exam    Heart and lungs: Regular rate and rhythm  without rub or gallop, lungs are bilaterally clear.    HEENT: Normocephalic atraumatic eyes are anicteric    Other:     Pertinant exam for procedure: Soft mild discomfort palpation in the epigastric region and somewhat to the left upper quadrant. No masses rebound or organomegaly. Bowel sounds are positive normoactive.    Planned proceedures: EGD and indicated procedures. I have discussed the risks benefits and complications of procedures to include not limited to bleeding, infection, perforation and the risk of sedation and the patient wishes to proceed.    Lollie Sails, MD Gastroenterology 11/24/2015  4:26 PM

## 2015-11-24 NOTE — Anesthesia Postprocedure Evaluation (Signed)
Anesthesia Post Note  Patient: Hailey [name]  Procedure(s) Performed: Procedure(s) (LRB): ESOPHAGOGASTRODUODENOSCOPY (EGD) WITH PROPOFOL (N/A)  Patient location during evaluation: Endoscopy Anesthesia Type: General Level of consciousness: awake and alert Pain management: pain level controlled Vital Signs Assessment: post-procedure vital signs reviewed and stable Respiratory status: spontaneous breathing, nonlabored ventilation, respiratory function stable and patient connected to nasal cannula oxygen Cardiovascular status: blood pressure returned to baseline and stable Postop Assessment: no signs of nausea or vomiting Anesthetic complications: no    Last Vitals:  Vitals:   11/24/15 1719 11/24/15 1729  BP: (!) 147/79 124/73  Pulse:    Resp:    Temp:      Last Pain:  Vitals:   11/24/15 1659  TempSrc: Tympanic                 Precious Haws [place]

## 2015-11-25 ENCOUNTER — Encounter: Payer: Self-pay | Admitting: Gastroenterology

## 2015-11-26 LAB — SURGICAL PATHOLOGY

## 2015-11-27 ENCOUNTER — Other Ambulatory Visit: Payer: Self-pay | Admitting: Gastroenterology

## 2015-11-27 DIAGNOSIS — K862 Cyst of pancreas: Secondary | ICD-10-CM

## 2015-12-04 ENCOUNTER — Emergency Department
Admission: EM | Admit: 2015-12-04 | Discharge: 2015-12-04 | Disposition: A | Discharge status: Home / Self Care | Payer: Commercial Managed Care - HMO | Attending: Emergency Medicine | Admitting: Emergency Medicine

## 2015-12-04 ENCOUNTER — Emergency Department: Payer: Commercial Managed Care - HMO

## 2015-12-04 DIAGNOSIS — J45909 Unspecified asthma, uncomplicated: Secondary | ICD-10-CM | POA: Insufficient documentation

## 2015-12-04 DIAGNOSIS — R112 Nausea with vomiting, unspecified: Secondary | ICD-10-CM | POA: Diagnosis not present

## 2015-12-04 DIAGNOSIS — I5032 Chronic diastolic (congestive) heart failure: Secondary | ICD-10-CM | POA: Insufficient documentation

## 2015-12-04 DIAGNOSIS — Z87891 Personal history of nicotine dependence: Secondary | ICD-10-CM | POA: Diagnosis not present

## 2015-12-04 DIAGNOSIS — J449 Chronic obstructive pulmonary disease, unspecified: Secondary | ICD-10-CM | POA: Diagnosis not present

## 2015-12-04 DIAGNOSIS — Z79899 Other long term (current) drug therapy: Secondary | ICD-10-CM | POA: Insufficient documentation

## 2015-12-04 DIAGNOSIS — R1111 Vomiting without nausea: Secondary | ICD-10-CM | POA: Diagnosis not present

## 2015-12-04 DIAGNOSIS — R1110 Vomiting, unspecified: Secondary | ICD-10-CM | POA: Diagnosis not present

## 2015-12-04 DIAGNOSIS — I110 Hypertensive heart disease with heart failure: Secondary | ICD-10-CM | POA: Diagnosis not present

## 2015-12-04 LAB — CBC
HEMATOCRIT: 31.3 % — AB (ref 35.0–47.0)
HEMOGLOBIN: 10.9 g/dL — AB (ref 12.0–16.0)
MCH: 32.3 pg (ref 26.0–34.0)
MCHC: 34.7 g/dL (ref 32.0–36.0)
MCV: 93.1 fL (ref 80.0–100.0)
Platelets: 225 10*3/uL (ref 150–440)
RBC: 3.37 MIL/uL — ABNORMAL LOW (ref 3.80–5.20)
RDW: 15.5 % — AB (ref 11.5–14.5)
WBC: 4.9 10*3/uL (ref 3.6–11.0)

## 2015-12-04 LAB — URINALYSIS COMPLETE WITH MICROSCOPIC (ARMC ONLY)
BACTERIA UA: NONE SEEN
BILIRUBIN URINE: NEGATIVE
GLUCOSE, UA: NEGATIVE mg/dL
Hgb urine dipstick: NEGATIVE
Ketones, ur: NEGATIVE mg/dL
Leukocytes, UA: NEGATIVE
Nitrite: NEGATIVE
Protein, ur: NEGATIVE mg/dL
Specific Gravity, Urine: 1.003 — ABNORMAL LOW (ref 1.005–1.030)
Squamous Epithelial / LPF: NONE SEEN
pH: 9 — ABNORMAL HIGH (ref 5.0–8.0)

## 2015-12-04 LAB — TROPONIN I

## 2015-12-04 LAB — COMPREHENSIVE METABOLIC PANEL
ALBUMIN: 4 g/dL (ref 3.5–5.0)
ALK PHOS: 73 U/L (ref 38–126)
ALT: 8 U/L — AB (ref 14–54)
ANION GAP: 6 (ref 5–15)
AST: 34 U/L (ref 15–41)
BILIRUBIN TOTAL: 0.6 mg/dL (ref 0.3–1.2)
BUN: 9 mg/dL (ref 6–20)
CALCIUM: 9.3 mg/dL (ref 8.9–10.3)
CO2: 29 mmol/L (ref 22–32)
Chloride: 107 mmol/L (ref 101–111)
Creatinine, Ser: 0.87 mg/dL (ref 0.44–1.00)
GFR calc Af Amer: >60 mL/min (ref >60)
GFR calc non Af Amer: >60 mL/min (ref >60)
GLUCOSE: 92 mg/dL (ref 65–99)
Potassium: 4.9 mmol/L (ref 3.5–5.1)
SODIUM: 142 mmol/L (ref 135–145)
TOTAL PROTEIN: 6.4 g/dL — AB (ref 6.5–8.1)

## 2015-12-04 LAB — LIPASE, BLOOD: Lipase: 17 U/L (ref 11–51)

## 2015-12-04 MED ORDER — METOCLOPRAMIDE HCL 5 MG/ML IJ SOLN
10.0000 mg | Freq: Once | INTRAMUSCULAR | Status: AC
Start: 2015-12-04 — End: 2015-12-04
  Administered 2015-12-04: 10 mg via INTRAVENOUS
  Filled 2015-12-04: qty 2

## 2015-12-04 MED ORDER — SODIUM CHLORIDE 0.9 % IV BOLUS (SEPSIS)
1000.0000 mL | Freq: Once | INTRAVENOUS | Status: AC
  Administered 2015-12-04: 1000 mL via INTRAVENOUS

## 2015-12-04 MED ORDER — METOCLOPRAMIDE HCL 5 MG/ML IJ SOLN
INTRAMUSCULAR | Status: AC
  Administered 2015-12-04: 10 mg via INTRAVENOUS
  Filled 2015-12-04: qty 2

## 2015-12-04 NOTE — ED Provider Notes (Signed)
[facility] Emergency Department Provider Note  ____________________________________________  Time seen: Approximately 2:26 PM  I have reviewed the triage vital signs and the nursing notes.   HISTORY  Chief Complaint Emesis   HPI Hailey [name] is a 82 y.o. female with h/o HOCM, CHF, anemia, COPD, DVT s/p IVC filter, HTN, HLD, paroxysmal afib, PUD, esophagitis presents for evaluation of nausea and vomiting. Patient has been having symptoms on and off for 2-1/2 months. Underwent a recent endoscopy 10 days ago that showed monilial esophagitis and presbyesophagus by Dr. Gustavo Lah. Underwent HIDA scan on 10/24/15 showing indeterminate findings of biliary dyskinesia. Patient reports that today she called Dr. Durenda Age' office to try to schedule an appointment as she continues to have her symptoms and was told to come to the ED to see if she could get MRCP done here. Patient has MRCP scheduled for one week from now. Patient denies abdominal pain. Her last episode of vomiting was 8:30 AM. She does have ODT Zofran but reports that does not work. She denies chest pain, shortness of breath, fever, chills, diarrhea, melena, hematemesis, hematochezia.   Past Medical History:  Diagnosis Date  . Anemia    Chronic  . Anxiety   . Arthritis   . Asthma   . Atrophic vaginitis   . Cerebral aneurysm    Hx of  . CHF (congestive heart failure) ([place])   . COPD (chronic obstructive pulmonary disease) ([place])   . Diverticulitis   . DVT (deep vein thrombosis) in pregnancy ([place]) 09/2008   a. LLE, recurrent hx, has IVC filter. Not on coumadin now with history of GI bleeding  . Fibrocystic disease of breast   . Fibromyalgia   . GERD (gastroesophageal reflux disease)   . Gout   . Gross hematuria   . Heart murmur   . History of colonoscopy   . Hyperlipidemia   . Hypertension   . Hypertrophic cardiomyopathy ([place])    a. Echo 4/09 with EF 70-75%, asymmetricy basal septal hypertrophy, mild  MR without systolic anterior motion of the mitral valve, LVOT gradient to 130 mmHg with Valsalva b. Echo 7/13: EF 60-65%, mild focal basal septal hypertrophy, no significant LVOT gradient, no MV SAM c. Echo 2/15: EF 55-60%, HOCM, resting LVOT gradient 29 mmHg, Valsalva LVOT gradient > 140 mmHg, mild LVH, mild TR, elevated PASP  . Incomplete bladder emptying   . Migraine headache    Hx of  . Obesity   . OSA (obstructive sleep apnea)    a. Intolerant to CPAP, wears 2L via n/c  . Osteoarthritis    Hx of left TKR  . PAF (paroxysmal atrial fibrillation) ([place])    a. Full-dose ASA alone 2/2 h/o GIB.  Marland Kitchen PAT (paroxysmal atrial tachycardia) ([place])   . 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 Pneumonia   . PUD (peptic ulcer disease)    With GI bleeding  . Pulmonary embolism ([place])   . Tremor     Patient Active Problem List   Diagnosis Date Noted  . Protein-calorie malnutrition, severe 10/23/2015  . Uncontrollable vomiting   . Nausea and vomiting 10/22/2015  . Tremor 07/07/2015  . Respiratory failure ([place]) 05/19/2015  . Weakness 01/27/2015  . Abdomen enlarged 09/26/2013  . CTCL (cutaneous T-cell lymphoma) ([place]) 09/18/2013  . A-fib ([place]) 08/09/2013  . Anxiety 08/09/2013  . CCF (congestive cardiac failure) ([place]) 08/09/2013  . Chronic pain 08/09/2013  . PAF (paroxysmal atrial fibrillation) ([place])   .  Chronic diastolic CHF (congestive heart failure) ([place]) 07/12/2013  . Benign essential HTN 07/01/2013  . Bilateral leg edema 06/06/2013  . Paroxysmal atrial fibrillation ([place]) 04/25/2013  . DOE (dyspnea on exertion) 10/11/2011  . COPD (chronic obstructive pulmonary disease) ([place]) 10/09/2011  . Cognitive decline 10/09/2011  . Behavior concern 08/23/2011  . Chest pain 10/26/2010  . Palpitations 10/26/2010  . PHLEBITIS AND THROMBOPHLEBITIS OF FEMORAL VEIN 10/13/2008  . DEEP VENOUS THROMBOPHLEBITIS, LEG, LEFT 10/09/2008  . GASTRIC ULCER, ACUTE 10/09/2008  .  PERSONAL HX COLONIC POLYPS 09/02/2008  . FATIGUE 08/27/2008  . HOCM (hypertrophic obstructive cardiomyopathy) ([place]) 11/06/2007  . HYPOTENSION, ORTHOSTATIC 11/06/2007  . ADENOMATOUS COLONIC POLYP 04/12/2007  . HIATAL HERNIA 04/12/2007  . Diverticulosis of colon (without mention of hemorrhage) 04/12/2007  . Hyperlipidemia 02/03/2007  . ANEMIA, CHRONIC 02/03/2007  . MIGRAINE HEADACHE 02/03/2007  . Essential hypertension 02/03/2007  . CEREBRAL ANEURYSM 02/03/2007  . GASTROESOPHAGEAL REFLUX DISEASE 02/03/2007  . OSTEOARTHRITIS 02/03/2007  . FIBROMYALGIA 02/03/2007  . CHRONIC FATIGUE SYNDROME 02/03/2007  . MITRAL VALVE PROLAPSE, HX OF 02/03/2007  . PULMONARY EMBOLISM, HX OF 02/03/2007  . TOTAL KNEE REPLACEMENT, LEFT, HX OF 02/03/2007  . HYSTERECTOMY, HX OF 02/03/2007  . Other acquired absence of organ 02/03/2007  . INGUINAL HERNIORRHAPHY, RIGHT, HX OF 02/03/2007    Past Surgical History:  Procedure Laterality Date  . ABDOMINAL HYSTERECTOMY    . APPENDECTOMY    . BLADDER SUSPENSION    . CARDIAC CATHETERIZATION  2009   No significant CAD  . CARDIOVASCULAR STRESS TEST     a. Lexiscan Myoview 3/14: EF 76%, no evidence of ischemia or WMAs  . cataract surgery    . CEREBRAL ANEURYSM REPAIR  1998  . ESOPHAGOGASTRODUODENOSCOPY (EGD) WITH PROPOFOL N/A 11/24/2015   Procedure: ESOPHAGOGASTRODUODENOSCOPY (EGD) WITH PROPOFOL;  Surgeon: Lollie Sails, MD;  Location[facility] ENDOSCOPY;  Service: Endoscopy;  Laterality: N/A;  . FOOT SURGERY     Bilateral  . INGUINAL HERNIA REPAIR  1968   Right  . KNEE ARTHROSCOPY  2009   Right  . SHOULDER SURGERY  1972   Right   Left 2012  . TONSILLECTOMY AND ADENOIDECTOMY    . TOTAL KNEE ARTHROPLASTY     Left    Prior to Admission medications   Medication Sig Start Date End Date Taking? Authorizing Provider  acetaminophen (TYLENOL) 325 MG tablet Take 2 tablets (650 mg total) by mouth every 6 (six) hours as needed for mild pain or moderate pain (temp >  101.5). 05/25/15   Nicholes Mango, MD  albuterol (PROVENTIL HFA;VENTOLIN HFA) 108 (90 Base) MCG/ACT inhaler Inhale 1 puff into the lungs every 6 (six) hours as needed for wheezing or shortness of breath.    Historical Provider, MD  albuterol (PROVENTIL) (2.5 MG/3ML) 0.083% nebulizer solution Take 3 mLs (2.5 mg total) by nebulization every 4 (four) hours as needed for wheezing or shortness of breath. 05/25/15   Nicholes Mango, MD  alendronate (FOSAMAX) 70 MG tablet Take 70 mg by mouth once a week. Take with a full glass of water on an empty stomach.    Historical Provider, MD  amiodarone (PACERONE) 200 MG tablet Take 1 tablet (200 mg total) by mouth daily. Pt is able to take an additional tablet if needed for breakthrough arrhythmias. 08/04/15   Minna Merritts, MD  Carbidopa-Levodopa ER (SINEMET CR) 25-100 MG tablet controlled release Take 1 tablet by mouth 2 (two) times daily. 05/25/15   Nicholes Mango, MD  dicyclomine (BENTYL) 10 MG  capsule Take 1 capsule (10 mg total) by mouth 3 (three) times daily as needed for spasms. Patient not taking: Reported on 11/24/2015 10/24/15   Gladstone Lighter, MD  dimenhyDRINATE (DRAMAMINE) 50 MG tablet Take 25-50 mg by mouth every 6 (six) hours as needed for dizziness.     Historical Provider, MD  docusate sodium (COLACE) 100 MG capsule Take 100 mg by mouth 2 (two) times daily as needed for mild constipation.    Historical Provider, MD  DULoxetine (CYMBALTA) 30 MG capsule Take 30 mg by mouth daily. Pt takes with a 60mg  capsule.    Historical Provider, MD  DULoxetine (CYMBALTA) 60 MG capsule Take 60 mg by mouth daily. Pt takes with a 30mg  capsule.    Historical Provider, MD  feeding supplement, ENSURE ENLIVE, (ENSURE ENLIVE) LIQD Take 237 mLs by mouth 2 (two) times daily between meals. Patient not taking: Reported on 10/22/2015 01/28/15   Bettey Costa, MD  fluticasone (FLONASE) 50 MCG/ACT nasal spray Place 2 sprays into both nostrils daily as needed for rhinitis.    Historical  Provider, MD  gabapentin (NEURONTIN) 100 MG capsule Take 200-400 mg by mouth 3 (three) times daily. Pt takes three capsules in the morning, two capsules in the evening, and four capsules at bedtime.    Historical Provider, MD  levothyroxine (SYNTHROID, LEVOTHROID) 75 MCG tablet Take 75 mcg by mouth daily before breakfast.    Historical Provider, MD  losartan (COZAAR) 50 MG tablet TAKE ONE-HALF (1/2) TABLET (25MG ) BY MOUTH DAILY 06/04/15   Minna Merritts, MD  lovastatin (MEVACOR) 40 MG tablet Take 40 mg by mouth at bedtime.    Historical Provider, MD  mometasone-formoterol (DULERA) 100-5 MCG/ACT AERO Inhale 2 puffs into the lungs 2 (two) times daily. 05/25/15   Nicholes Mango, MD  montelukast (SINGULAIR) 10 MG tablet Take 10 mg by mouth at bedtime.    Historical Provider, MD  nitrofurantoin, macrocrystal-monohydrate, (MACROBID) 100 MG capsule Take 100 mg by mouth 2 (two) times daily.    Historical Provider, MD  ondansetron (ZOFRAN-ODT) 4 MG disintegrating tablet Take 4 mg by mouth every 8 (eight) hours as needed for nausea or vomiting.    Historical Provider, MD  oxyCODONE (OXY IR/ROXICODONE) 5 MG immediate release tablet Take 1 tablet (5 mg total) by mouth every 6 (six) hours as needed for moderate pain or severe pain. 05/25/15   Nicholes Mango, MD  pantoprazole (PROTONIX) 40 MG tablet Take 40 mg by mouth daily.    Historical Provider, MD  polyethylene glycol (MIRALAX / GLYCOLAX) packet Take 17 g by mouth daily as needed for moderate constipation.     Historical Provider, MD  potassium chloride (K-DUR,KLOR-CON) 10 MEQ tablet Take 2 tablets (20 mEq total) by mouth 2 (two) times daily. 07/20/15   Minna Merritts, MD  senna (SENOKOT) 8.6 MG TABS tablet Take 2 tablets (17.2 mg total) by mouth 2 (two) times daily. 05/25/15   Nicholes Mango, MD  tiotropium (SPIRIVA) 18 MCG inhalation capsule Place 18 mcg into inhaler and inhale daily.    Historical Provider, MD  torsemide (DEMADEX) 20 MG tablet Take 1 tablet (20 mg  total) by mouth 2 (two) times daily. 10/24/15   Gladstone Lighter, MD  triamcinolone ointment (KENALOG) 0.1 % Apply 1 application topically 2 (two) times daily as needed (for rash).     Historical Provider, MD    Allergies Aspirin; Codeine; Compazine [prochlorperazine]; Ketorolac tromethamine; Nsaids; Phenazopyridine hcl; and Macrodantin [nitrofurantoin macrocrystal]  Family History  Problem Relation Age  of Onset  . Pancreatic cancer Mother   . Hypertension Mother     father  . Diabetes Mellitus II Mother     Sister  . Colon cancer Father   . Colon polyps Father   . Heart disease Father   . Stroke Father   . Heart disease Sister     More than 1 sister  . Colon cancer Brother   . Colon polyps Other     Siblings  . Ulcers Brother     Social History Social History  Substance Use Topics  . Smoking status: Former Smoker    Packs/day: 1.00    Years: 60.00    Types: Cigarettes    Quit date: 12/15/2008  . Smokeless tobacco: Never Used     Comment: smoked for 50 years quit 5 + years  . Alcohol use 0.0 oz/week     Comment: Socially    Review of Systems  Constitutional: Negative for fever. Eyes: Negative for visual changes. ENT: Negative for sore throat. Cardiovascular: Negative for chest pain. Respiratory: Negative for shortness of breath. Gastrointestinal: Negative for abdominal pain. + N/V. Genitourinary: Negative for dysuria. Musculoskeletal: Negative for back pain. Skin: Negative for rash. Neurological: Negative for headaches, weakness or numbness.  ____________________________________________   PHYSICAL EXAM:  VITAL SIGNS: ED Triage Vitals  Enc Vitals Group     BP 12/04/15 1402 (!) 132/57     Pulse Rate 12/04/15 1402 60     Resp 12/04/15 1402 18     Temp 12/04/15 1402 98.1 F (36.7 C)     Temp Source 12/04/15 1402 Oral     SpO2 12/04/15 1402 94 %     Weight 12/04/15 1403 168 lb (76.2 kg)     Height 12/04/15 1403 5\' 1"  (1.549 m)     Head Circumference --        Peak Flow --      Pain Score --      Pain Loc --      Pain Edu? --      Excl. in [place]? --     Constitutional: Alert and oriented. Well appearing and in no apparent distress. HEENT:      Head: Normocephalic and atraumatic.         Eyes: Conjunctivae are normal. Sclera is non-icteric. EOMI. PERRL      Mouth/Throat: Mucous membranes are moist.       Neck: Supple with no signs of meningismus. Cardiovascular: Regular rate and rhythm. No murmurs, gallops, or rubs. 2+ symmetrical distal pulses are present in all extremities. No JVD. Respiratory: Normal respiratory effort. Lungs are clear to auscultation bilaterally. No wheezes, crackles, or rhonchi.  Gastrointestinal: Soft, non tender, and non distended with positive bowel sounds. No rebound or guarding. Musculoskeletal: Nontender with normal range of motion in all extremities. No edema, cyanosis, or erythema of extremities. Neurologic: Normal speech and language. Face is symmetric. Moving all extremities. No gross focal neurologic deficits are appreciated. Skin: Skin is warm, dry and intact. No rash noted. Psychiatric: Mood and affect are normal. Speech and behavior are normal.  ____________________________________________   LABS (all labs ordered are listed, but only abnormal results are displayed)  Labs Reviewed  COMPREHENSIVE METABOLIC PANEL - Abnormal; Notable for the following:       Result Value   Total Protein 6.4 (*)    ALT 8 (*)    All other components within normal limits  CBC - Abnormal; Notable for the following:    RBC 3.37 (*)  Hemoglobin 10.9 (*)    HCT 31.3 (*)    RDW 15.5 (*)    All other components within normal limits  URINALYSIS COMPLETEWITH MICROSCOPIC ([facility] ONLY) - Abnormal; Notable for the following:    Color, Urine STRAW (*)    APPearance CLEAR (*)    Specific Gravity, Urine 1.003 (*)    pH 9.0 (*)    All other components within normal limits  LIPASE, BLOOD  TROPONIN I    ____________________________________________  EKG  ED ECG REPORT I, Rudene Re, the attending physician, personally viewed and interpreted this ECG.  Sinus rhythm, first-degree AV block, normal QRS and QTc intervals, normal axis, ST depressions on lateral leads which is unchanged from prior, no ST elevations.   ____________________________________________  RADIOLOGY  none  ____________________________________________   PROCEDURES  Procedure(s) performed: None Procedures Critical Care performed:  None ____________________________________________   INITIAL IMPRESSION / ASSESSMENT AND PLAN / ED COURSE  82 y.o. female with h/o HOCM, CHF, anemia, COPD, DVT s/p IVC filter, HTN, HLD, paroxysmal afib, PUD, esophagitis presents for evaluation of nausea and vomiting. Patient has had these symptoms for 2-1/2 months. Has undergone a HIDA scan and endoscopy. Was sent here today for possible MRCP although she does have it scheduled a week from today. We'll check electrolytes, kidney function, LFTs, lipase, EKG and troponin. We'll give her IV fluids and IV Reglan.  Clinical Course   _________________________ 6:15 PM on 12/04/2015 ----------------------------------------- Lab work with no acute findings. Normal kidney function, normal LFTs and lipase, normal white count, normal UA. Patient received IV Zofran and IV fluids. She is tolerating by mouth. No episodes of vomiting the emergency room. Patient be discharged home with close follow-up with PCP and GI for MRCP next week. Discussed return precautions with patient was in agreement at this time.  Pertinent labs & imaging results that were available during my care of the patient were reviewed by me and considered in my medical decision making (see chart for details).    ____________________________________________   FINAL CLINICAL IMPRESSION(S) / ED DIAGNOSES  Final diagnoses:  Non-intractable vomiting with nausea,  unspecified vomiting type      NEW MEDICATIONS STARTED DURING THIS VISIT:  New Prescriptions   No medications on file     Note:  This document was prepared using Dragon voice recognition software and may include unintentional dictation errors.    Rudene Re, MD 12/04/15 [phone]

## 2015-12-04 NOTE — ED Triage Notes (Addendum)
Pt is scheduled for a mrcp, pt has been evaluated and treated with Dr Gustavo Lah for an endoscopy last week, pt states that she has been having ongoing vomiting intermittently, pt states that all night she has vomited, but states that right now she is doing ok, no distress noted at this time, pt alert and reporting history of current treatments and evals. Pt reports her last episode of vomiting was this am at 0830 and has been calling the dr all week attempting to get back into see dr Gustavo Lah and was told today when the nurse called her back to go to the ER, pt states that she didn't take her morning meds due to the vomiting earlier today

## 2015-12-08 DIAGNOSIS — R0689 Other abnormalities of breathing: Secondary | ICD-10-CM | POA: Diagnosis not present

## 2015-12-08 DIAGNOSIS — J449 Chronic obstructive pulmonary disease, unspecified: Secondary | ICD-10-CM | POA: Diagnosis not present

## 2015-12-08 DIAGNOSIS — M5136 Other intervertebral disc degeneration, lumbar region: Secondary | ICD-10-CM | POA: Diagnosis not present

## 2015-12-08 DIAGNOSIS — Z96659 Presence of unspecified artificial knee joint: Secondary | ICD-10-CM | POA: Diagnosis not present

## 2015-12-10 ENCOUNTER — Other Ambulatory Visit: Payer: Self-pay | Admitting: Gastroenterology

## 2015-12-10 DIAGNOSIS — K859 Acute pancreatitis without necrosis or infection, unspecified: Secondary | ICD-10-CM

## 2015-12-11 ENCOUNTER — Ambulatory Visit
Admission: RE | Admit: 2015-12-11 | Discharge: 2015-12-11 | Disposition: A | Discharge status: Home / Self Care | Payer: Commercial Managed Care - HMO | Source: Ambulatory Visit | Attending: Gastroenterology | Admitting: Gastroenterology

## 2015-12-11 DIAGNOSIS — N281 Cyst of kidney, acquired: Secondary | ICD-10-CM | POA: Diagnosis not present

## 2015-12-11 DIAGNOSIS — K8689 Other specified diseases of pancreas: Secondary | ICD-10-CM | POA: Diagnosis not present

## 2015-12-11 DIAGNOSIS — K859 Acute pancreatitis without necrosis or infection, unspecified: Secondary | ICD-10-CM

## 2015-12-11 DIAGNOSIS — R935 Abnormal findings on diagnostic imaging of other abdominal regions, including retroperitoneum: Secondary | ICD-10-CM | POA: Diagnosis not present

## 2015-12-11 DIAGNOSIS — K862 Cyst of pancreas: Secondary | ICD-10-CM | POA: Diagnosis not present

## 2015-12-11 MED ORDER — GADOBENATE DIMEGLUMINE 529 MG/ML IV SOLN
15.0000 mL | Freq: Once | INTRAVENOUS | Status: AC | PRN
  Administered 2015-12-11: 15 mL via INTRAVENOUS

## 2016-01-08 DIAGNOSIS — M5136 Other intervertebral disc degeneration, lumbar region: Secondary | ICD-10-CM | POA: Diagnosis not present

## 2016-01-08 DIAGNOSIS — R0689 Other abnormalities of breathing: Secondary | ICD-10-CM | POA: Diagnosis not present

## 2016-01-08 DIAGNOSIS — Z96659 Presence of unspecified artificial knee joint: Secondary | ICD-10-CM | POA: Diagnosis not present

## 2016-01-08 DIAGNOSIS — J449 Chronic obstructive pulmonary disease, unspecified: Secondary | ICD-10-CM | POA: Diagnosis not present

## 2016-01-18 ENCOUNTER — Other Ambulatory Visit: Payer: Self-pay | Admitting: Cardiovascular Disease

## 2016-01-18 MED ORDER — POTASSIUM CHLORIDE CRYS ER 10 MEQ PO TBCR: 20.0000 meq | EXTENDED_RELEASE_TABLET | Freq: Two times a day (BID) | ORAL | 0 refills | Status: DC

## 2016-01-19 ENCOUNTER — Ambulatory Visit (INDEPENDENT_AMBULATORY_CARE_PROVIDER_SITE_OTHER): Payer: Commercial Managed Care - HMO | Admitting: General Surgery

## 2016-01-19 ENCOUNTER — Encounter: Payer: Self-pay | Admitting: General Surgery

## 2016-01-19 VITALS — BP 130/62 | HR 63 | Temp 97.6°F | Ht 61.0 in | Wt 167.0 lb

## 2016-01-19 DIAGNOSIS — R112 Nausea with vomiting, unspecified: Secondary | ICD-10-CM

## 2016-01-19 NOTE — Patient Instructions (Signed)
We will send a referral to [facility]. Someone from their office will contact you. Please call our office if you have any questions or concerns.

## 2016-01-19 NOTE — Progress Notes (Signed)
Outpatient Surgical Follow Up  01/19/2016  Hailey [name] is an 82 y.o. female.   Chief Complaint  Patient presents with  . Follow-up    Ed follow up- Biliary Dyskensia    HPI: 82 year old female presents to clinic for follow-up for chronic, intermittent nausea and vomiting. She was evaluated in the hospital approximately 3 months ago for the identical problem. She's been seen by GI since and had an upper endoscopy and an MRCP. Patient reports that she has not been given an actual diagnosis but continues to have episodes of nausea, vomiting, abdominal bloating, lower abdominal pain, explosive diarrhea. She states that they happen in cycles about every 2-3 days. She's not had any over the last 2 days. She was sent back to the surgery clinic for further evaluation secondary to a marginal HIDA scan with question of biliary dyskinesia. Patient reports that her abdominal pain, nausea, vomiting or unrelated to eating. She anything she wants without any association to the pain. She reports the pain is more related to activity that it is too diet. She has numerous chronic medical problems and the patient's daughter is somewhat convinced that it is related to her chronic constipation and narcotic usage. She has not had a colonoscopy in the last 5 years and her primary complaint of pain today is of lower abdominal pain. In her lower midline on deep palpation she has tenderness but without guarding or rebound. Patient is mostly just frustrated and states that something needs to be done to make her better. She denies any current fevers, chills, chest pain, shortness of breath. She has been having the aforementioned intermittent nausea, vomiting, diarrhea, constipation. He states when she does have bowel movements that they are filled with mucus.  Past Medical History:  Diagnosis Date  . Anemia    Chronic  . Anxiety   . Arthritis   . Asthma   . Atrophic vaginitis   . Cerebral aneurysm    Hx of  . CHF  (congestive heart failure) ([place])   . COPD (chronic obstructive pulmonary disease) ([place])   . Diverticulitis   . DVT (deep vein thrombosis) in pregnancy ([place]) 09/2008   a. LLE, recurrent hx, has IVC filter. Not on coumadin now with history of GI bleeding  . Fibrocystic disease of breast   . Fibromyalgia   . GERD (gastroesophageal reflux disease)   . Gout   . Gross hematuria   . Heart murmur   . History of colonoscopy   . Hyperlipidemia   . Hypertension   . Hypertrophic cardiomyopathy ([place])    a. Echo 4/09 with EF 70-75%, asymmetricy basal septal hypertrophy, mild MR without systolic anterior motion of the mitral valve, LVOT gradient to 130 mmHg with Valsalva b. Echo 7/13: EF 60-65%, mild focal basal septal hypertrophy, no significant LVOT gradient, no MV SAM c. Echo 2/15: EF 55-60%, HOCM, resting LVOT gradient 29 mmHg, Valsalva LVOT gradient > 140 mmHg, mild LVH, mild TR, elevated PASP  . Incomplete bladder emptying   . Migraine headache    Hx of  . Obesity   . OSA (obstructive sleep apnea)    a. Intolerant to CPAP, wears 2L via n/c  . Osteoarthritis    Hx of left TKR  . PAF (paroxysmal atrial fibrillation) ([place])    a. Full-dose ASA alone 2/2 h/o GIB.  Marland Kitchen PAT (paroxysmal atrial tachycardia) ([place])   . 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 Pneumonia   . PUD (peptic  ulcer disease)    With GI bleeding  . Pulmonary embolism ([place])   . Tremor     Past Surgical History:  Procedure Laterality Date  . ABDOMINAL HYSTERECTOMY    . APPENDECTOMY    . BLADDER SUSPENSION    . CARDIAC CATHETERIZATION  2009   No significant CAD  . CARDIOVASCULAR STRESS TEST     a. Lexiscan Myoview 3/14: EF 76%, no evidence of ischemia or WMAs  . cataract surgery    . CEREBRAL ANEURYSM REPAIR  1998  . ESOPHAGOGASTRODUODENOSCOPY (EGD) WITH PROPOFOL N/A 11/24/2015   Procedure: ESOPHAGOGASTRODUODENOSCOPY (EGD) WITH PROPOFOL;  Surgeon: Lollie Sails, MD;  Location: [facility]  ENDOSCOPY;  Service: Endoscopy;  Laterality: N/A;  . FOOT SURGERY     Bilateral  . INGUINAL HERNIA REPAIR  1968   Right  . KNEE ARTHROSCOPY  2009   Right  . SHOULDER SURGERY  1972   Right   Left 2012  . TONSILLECTOMY AND ADENOIDECTOMY    . TOTAL KNEE ARTHROPLASTY     Left    Family History  Problem Relation Age of Onset  . Pancreatic cancer Mother   . Hypertension Mother     father  . Diabetes Mellitus II Mother     Sister  . Colon cancer Father   . Colon polyps Father   . Heart disease Father   . Stroke Father   . Heart disease Sister     More than 1 sister  . Colon cancer Brother   . Colon polyps Other     Siblings  . Ulcers Brother     Social History:  reports that she quit smoking about 7 years ago. Her smoking use included Cigarettes. She has a 60.00 pack-year smoking history. She has never used smokeless tobacco. She reports that she drinks alcohol. She reports that she does not use drugs.  Allergies:  Allergies  Allergen Reactions  . Aspirin Other (See Comments)    Reaction:  GI bleeding   . Codeine Nausea And Vomiting  . Compazine [Prochlorperazine] Other (See Comments)    Confusion  . Ketorolac Tromethamine Other (See Comments)    Reaction:  Headache   . Nsaids Other (See Comments)    Reaction:  GI bleeding   . Phenazopyridine Hcl Other (See Comments)    Reaction:  Vision problems   . Macrodantin [Nitrofurantoin Macrocrystal] Rash    Medications reviewed.    ROS A multipoint review of systems was completed. All pertinent positives and negatives are documented within the history of present illness and remainder are negative.   BP 130/62   Pulse 63   Temp 97.6 F (36.4 C) (Oral)   Ht 5\' 1"  (1.549 m)   Wt 75.8 kg (167 lb)   BMI 31.55 kg/m   Physical Exam Gen.: No acute distress Neck: Supple and nontender Chest: Clear to auscultation without any usage of accessory muscles or splinting. Heart: Regular rate and rhythm Abdomen: Soft,  minimally distended, tender to deep palpation in the lower midline. No evidence of rebound or guarding. There are active bowel sounds in all quadrants that are intermittently high-pitched. Extremities: Moves all extremities well    No results found for this or any previous visit (from the past 48 hour(s)). No results found.  Assessment/Plan:  1. Nausea and vomiting, intractability of vomiting not specified, unspecified vomiting type 82 year old female with nausea and vomiting. Her history does not correlate with biliary dyskinesia. It is highly unlikely that her lower abdominal pain and  her cyclical diarrhea, nausea, vomiting has anything to do with her gallbladder function. Regardless, she is a very poor operative candidate given her numerous comorbidities. A long conversation with the patient and her daughter about the possible causes of her presenting symptoms. Discussed motility versus subacute diverticulitis. Discussed the likelihood of either of those or something totally unrelated. Given the difficulty of making a diagnosis I recommended and they accepted referral to a tertiary care center for further evaluation. We'll recheck out to [facility] and [facility] groups to see who could see her first. All questions answered to the patient's satisfaction and they'll follow-up with her primary care provider. No indications for any operative intervention at this time.  A total of 25 minutes was used on this encounter with greater than 50% being used for counseling and coordination of care.   Clayburn Pert, MD FACS General Surgeon  01/19/2016,9:58 AM

## 2016-01-20 ENCOUNTER — Telehealth: Payer: Self-pay | Admitting: General Surgery

## 2016-01-20 NOTE — Telephone Encounter (Signed)
A referral has been faxed to [facility] at [phone] per Dr Adonis Huguenin. Rob Hickman will contact the patient once notes are reviewed and make the patient an appointment within 5-7 business days. I will follow up to make sure appointment is made.

## 2016-01-26 ENCOUNTER — Encounter: Payer: Self-pay | Admitting: Cardiovascular Disease

## 2016-01-26 ENCOUNTER — Ambulatory Visit (INDEPENDENT_AMBULATORY_CARE_PROVIDER_SITE_OTHER): Payer: Commercial Managed Care - HMO | Admitting: Cardiovascular Disease

## 2016-01-26 VITALS — BP 110/52 | HR 72 | Ht 61.0 in | Wt 165.5 lb

## 2016-01-26 DIAGNOSIS — G8929 Other chronic pain: Secondary | ICD-10-CM

## 2016-01-26 DIAGNOSIS — R112 Nausea with vomiting, unspecified: Secondary | ICD-10-CM

## 2016-01-26 DIAGNOSIS — I480 Paroxysmal atrial fibrillation: Secondary | ICD-10-CM | POA: Diagnosis not present

## 2016-01-26 DIAGNOSIS — I5032 Chronic diastolic (congestive) heart failure: Secondary | ICD-10-CM

## 2016-01-26 DIAGNOSIS — I421 Obstructive hypertrophic cardiomyopathy: Secondary | ICD-10-CM | POA: Diagnosis not present

## 2016-01-26 DIAGNOSIS — E782 Mixed hyperlipidemia: Secondary | ICD-10-CM | POA: Diagnosis not present

## 2016-01-26 DIAGNOSIS — J432 Centrilobular emphysema: Secondary | ICD-10-CM

## 2016-01-26 DIAGNOSIS — I10 Essential (primary) hypertension: Secondary | ICD-10-CM

## 2016-01-26 NOTE — Progress Notes (Signed)
Cardiology Office Note  Date:  01/26/2016   ID:  Hailey [name], DOB [birth year], MRN [id number]  PCP:  Marcello Fennel, MD   Chief Complaint  Patient presents with  . other    6 month f/u c/o nausea and vomiting since July. Meds reviewed verbally with pt.    HPI:  Hailey [name] is a 82 y.o. female w/ PMHx s/f HOCM, h/o DVT (s/p IVC filter), h/o PUD/GIB, PAT, cerebral aneurysm, COPD, pulmonary HTN, HTN, HLD, OSA (intolerant to CPAP, on 2L via n/c), fibromyalgia and anxiety who was admitted to [facility] 2/25 to 04/11/13 for new onset atrial fibrillation w/ RVR. baseline exertional dyspnea- multifactorial due to deconditioning, COPD. Underwent pulmonary rehab in 2014, followed by Dr. Gwenette Greet. History of paroxysmal atrial fibrillation.She was not started on anticoagulation given a history of GI bleeding.  h/o profuse GIB in the past, required IVC filter for DVT in the past.  Admitted to the hospital February 2015, April 2015, again in June She has a diagnosis of T-cell lymphoma She presents today for routine follow-up of her atrial fibrillation  In follow-up she reports having N/V since July 2017 Seen by GI, Dr. Donnella Sham, had EGD, did Bx  Reports symptoms as a daily chronic nausea and epigastric discomfort:  She was hospitalized 10/22/15 with intractable NV/ruq abodminal pain   She has Rare atrial fib, once a week, On amiodarone, episodes last 20 min Lost 20 pounds secondary to her GI issues, blood pressure has been running lower She continues to take torsemide 40 mg in the morning, 20 mg in the evening She still feels weak, has not been going down to meals in the cafeteria, has been eating in her room.  chronic back pain from compression fractures.  on Sinemet presumably for early Parkinson's  EKG on today's visit shows normal sinus rhythm with rate 72 bpm, nonspecific ST abnormality, borderline prolonged QTC  Other past medical history reviewed  pneumonia 05/19/2015, hospitalized at that time  with discharge 05/25/2015 She initially presented with hypoxia, elevated temperature, shortness of breath, sputum She spent time in rehabilitation at [facility]   in 12/02/2013 she had a viral infection, lab work showing severe dehydration, low potassium.  developed pneumonia,  treated for bronchitis January 2016.   diagnosis of T-cell lymphoma, seen by dermatology.  She has skin changes on her lower extremities, and in various places on her body. Reports having a ventral hernia but with no symptoms  In June 2015 on a clinic visit she reported having severe shortness of breath, leg swelling, weight gain. Torsemide 80 mg was not working for her. Metolazone was added. She started on metolazone and after one dose, had rapid 10 pound weight loss per the patient. She then took only half pills and reported having minimal diuresis. She did present to the hospital she did not feel well. Since her discharge, she has had creatinine above her baseline. Lab work 08/06/2011 showing creatinine 1.4, elevated BUN. At this time she was on torsemide 80 mg daily. It was decreased down to 60 mg daily Lab work 08/08/2013 showed creatinine 1.6, BUN 50 with potassium 3.0. Torsemide dosing decreased down to 40 mg daily  history of severe neck pain, radiating pain up to her head with headaches  PMH:   has a past medical history of Anemia; Anxiety; Arthritis; Asthma; Atrophic vaginitis; Cerebral aneurysm; CHF (congestive heart failure) (HCC); COPD (chronic obstructive pulmonary disease) ([place]); Diverticulitis; DVT (deep vein thrombosis) in pregnancy ([place]) (09/2008); Fibrocystic disease of breast;  Fibromyalgia; GERD (gastroesophageal reflux disease); Gout; Gross hematuria; Heart murmur; History of colonoscopy; Hyperlipidemia; Hypertension; Hypertrophic cardiomyopathy ([place]); Incomplete bladder emptying; Migraine headache; Obesity; OSA (obstructive sleep apnea); Osteoarthritis; PAF (paroxysmal atrial fibrillation) ([place]); PAT  (paroxysmal atrial tachycardia) ([place]); 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  PSH:    Past Surgical History:  Procedure Laterality Date  . ABDOMINAL HYSTERECTOMY    . APPENDECTOMY    . BLADDER SUSPENSION    . CARDIAC CATHETERIZATION  2009   No significant CAD  . CARDIOVASCULAR STRESS TEST     a. Lexiscan Myoview 3/14: EF 76%, no evidence of ischemia or WMAs  . cataract surgery    . CEREBRAL ANEURYSM REPAIR  1998  . ESOPHAGOGASTRODUODENOSCOPY (EGD) WITH PROPOFOL N/A 11/24/2015   Procedure: ESOPHAGOGASTRODUODENOSCOPY (EGD) WITH PROPOFOL;  Surgeon: Lollie Sails, MD;  Location: [facility] ENDOSCOPY;  Service: Endoscopy;  Laterality: N/A;  . FOOT SURGERY     Bilateral  . INGUINAL HERNIA REPAIR  1968   Right  . KNEE ARTHROSCOPY  2009   Right  . SHOULDER SURGERY  1972   Right   Left 2012  . TONSILLECTOMY AND ADENOIDECTOMY    . TOTAL KNEE ARTHROPLASTY     Left    Current Outpatient Prescriptions  Medication Sig Dispense Refill  . acetaminophen (TYLENOL) 325 MG tablet Take 2 tablets (650 mg total) by mouth every 6 (six) hours as needed for mild pain or moderate pain (temp > 101.5).    Marland Kitchen albuterol (PROVENTIL HFA;VENTOLIN HFA) 108 (90 Base) MCG/ACT inhaler Inhale 1 puff into the lungs every 6 (six) hours as needed for wheezing or shortness of breath.    Marland Kitchen albuterol (PROVENTIL) (2.5 MG/3ML) 0.083% nebulizer solution Take 3 mLs (2.5 mg total) by nebulization every 4 (four) hours as needed for wheezing or shortness of breath. 75 mL 12  . amiodarone (PACERONE) 200 MG tablet Take 1 tablet (200 mg total) by mouth daily. Pt is able to take an additional tablet if needed for breakthrough arrhythmias. 30 tablet 6  . Carbidopa-Levodopa ER (SINEMET CR) 25-100 MG tablet controlled release Take 1 tablet by mouth 2 (two) times daily. 60 tablet 0  . dimenhyDRINATE (DRAMAMINE) 50 MG tablet Take  25-50 mg by mouth every 6 (six) hours as needed for dizziness.     . docusate sodium (COLACE) 100 MG capsule Take 100 mg by mouth 2 (two) times daily as needed for mild constipation.    . DULoxetine (CYMBALTA) 30 MG capsule Take 30 mg by mouth daily. Pt takes with a 60mg  capsule.    . DULoxetine (CYMBALTA) 60 MG capsule Take 60 mg by mouth daily. Pt takes with a 30mg  capsule.    . feeding supplement, ENSURE ENLIVE, (ENSURE ENLIVE) LIQD Take 237 mLs by mouth 2 (two) times daily between meals. 237 mL 12  . fluticasone (FLONASE) 50 MCG/ACT nasal spray Place 2 sprays into both nostrils daily as needed for rhinitis.    Marland Kitchen gabapentin (NEURONTIN) 100 MG capsule Take 200-400 mg by mouth 3 (three) times daily. Pt takes three capsules in the morning, two capsules in the evening, and four capsules at bedtime.    Marland Kitchen levothyroxine (SYNTHROID, LEVOTHROID) 75 MCG tablet Take 75 mcg by mouth daily before breakfast.    . lovastatin (MEVACOR) 40 MG tablet Take 40 mg by mouth at bedtime.    . mometasone-formoterol (DULERA) 100-5 MCG/ACT AERO Inhale 2 puffs into the lungs 2 (  two) times daily. 1 Inhaler 0  . montelukast (SINGULAIR) 10 MG tablet Take 10 mg by mouth at bedtime.    . nitrofurantoin, macrocrystal-monohydrate, (MACROBID) 100 MG capsule Take 100 mg by mouth 2 (two) times daily.    . ondansetron (ZOFRAN-ODT) 4 MG disintegrating tablet Take 4 mg by mouth every 8 (eight) hours as needed for nausea or vomiting.    Marland Kitchen oxyCODONE (OXY IR/ROXICODONE) 5 MG immediate release tablet Take 1 tablet (5 mg total) by mouth every 6 (six) hours as needed for moderate pain or severe pain. 30 tablet 0  . pantoprazole (PROTONIX) 40 MG tablet Take 40 mg by mouth daily.    . polyethylene glycol (MIRALAX / GLYCOLAX) packet Take 17 g by mouth daily as needed for moderate constipation.     . potassium chloride (K-DUR,KLOR-CON) 10 MEQ tablet Take 2 tablets (20 mEq total) by mouth 2 (two) times daily. 120 tablet 0  . senna (SENOKOT) 8.6 MG  TABS tablet Take 2 tablets (17.2 mg total) by mouth 2 (two) times daily. 120 each 0  . tiotropium (SPIRIVA) 18 MCG inhalation capsule Place 18 mcg into inhaler and inhale daily.    Marland Kitchen torsemide (DEMADEX) 20 MG tablet Take 1 tablet (20 mg total) by mouth 2 (two) times daily. 90 tablet 3  . triamcinolone ointment (KENALOG) 0.1 % Apply 1 application topically 2 (two) times daily as needed (for rash).      No current facility-administered medications for this visit.      Allergies:   Aspirin; Codeine; Compazine [prochlorperazine]; Ketorolac tromethamine; Nsaids; Phenazopyridine hcl; and Macrodantin [nitrofurantoin macrocrystal]   Social History:  The patient  reports that she quit smoking about 7 years ago. Her smoking use included Cigarettes. She has a 60.00 pack-year smoking history. She has never used smokeless tobacco. She reports that she drinks alcohol. She reports that she does not use drugs.   Family History:   family history includes Colon cancer in her brother and father; Colon polyps in her father and other; Diabetes Mellitus II in her mother; Heart disease in her father and sister; Hypertension in her mother; Pancreatic cancer in her mother; Stroke in her father; Ulcers in her brother.    Review of Systems: Review of Systems  Constitutional: Positive for malaise/fatigue.  Respiratory: Negative.   Cardiovascular: Negative.   Gastrointestinal: Positive for diarrhea, nausea and vomiting.  Musculoskeletal: Negative.   Neurological: Positive for weakness.  Psychiatric/Behavioral: Negative.   All other systems reviewed and are negative.    PHYSICAL EXAM: VS:  BP (!) 110/52 (BP Location: Left Arm, Patient Position: Sitting, Cuff Size: Normal)   Pulse 72   Ht 5\' 1"  (1.549 m)   Wt 165 lb 8 oz (75.1 kg)   BMI 31.27 kg/m  , BMI Body mass index is 31.27 kg/m. GEN: Well nourished, well developed, in no acute distress  HEENT: normal  Neck: no JVD, carotid bruits, or masses Cardiac:  RRR; 2+ SEM LSB,  No  rubs, or gallops,no edema  Respiratory:  clear to auscultation bilaterally, normal work of breathing GI: soft, nontender, nondistended, + BS MS: no deformity or atrophy  Skin: warm and dry, no rash Neuro:  Strength and sensation are intact Psych: euthymic mood, full affect    Recent Labs: 01/27/2015: B Natriuretic Peptide 28.0; TSH 37.387 10/24/2015: Magnesium 2.1 12/04/2015: ALT 8; BUN 9; Creatinine, Ser 0.87; Hemoglobin 10.9; Platelets 225; Potassium 4.9; Sodium 142    Lipid Panel Lab Results  Component Value Date  CHOL 205 (H) 05/17/2013   HDL 47 05/17/2013   LDLCALC 96 05/17/2013   TRIG 308 (H) 05/17/2013      Wt Readings from Last 3 Encounters:  01/26/16 165 lb 8 oz (75.1 kg)  01/19/16 167 lb (75.8 kg)  12/04/15 168 lb (76.2 kg)       ASSESSMENT AND PLAN:  Essential hypertension - Plan: EKG 12-Lead Blood pressure running low likely secondary to dramatic weight loss. Recommended she hold her losartan  Paroxysmal atrial fibrillation ([place]) - Plan: EKG 12-Lead Rare episodes of atrial fibrillation. No changes to her medications, continue amiodarone Not on anticoagulation secondary to history of GI bleeding  Mixed hyperlipidemia Encouraged her to stay on her lovastatin  HOCM (hypertrophic obstructive cardiomyopathy) ([place]) Murmur appreciated on exam, likely of little clinical significance at this time  Chronic diastolic CHF (congestive heart failure) ([place]) Appears relatively euvolemic on today's visit Currently on high-dose diuretics Reports that she drinks significant fluids daily  Centrilobular emphysema (HCC) Prior smoking history, not actively smoking No recent COPD exacerbation  Nausea and vomiting, intractability of vomiting not specified, unspecified vomiting type Etiology of her GI issues is unclear Previously seen by GI at [facility]. Significant weight loss over the past 6 months.  Scheduled to see [facility] in follow-up   interestingly drinking large amounts of fluid daily   Other chronic pain Chronic pain issues, asking for oxycodone We are unable to fill is for her. We have referred her back to primary care  Family stress Patient reports minimal support from her children They feel she is making up her medical issues. She is discouraged that they are not more empathetic  Disposition:   F/U  6 months  Numerous issues discussed as above  Total encounter time more than 45 minutes  Greater than 50% was spent in counseling and coordination of care with the patient    Orders Placed This Encounter  Procedures  . EKG 12-Lead     Signed, Esmond Plants, M.D., Ph.D. 01/26/2016  [place], [place]

## 2016-01-26 NOTE — Patient Instructions (Addendum)
Medication Instructions:   Please take three potassium a day, instead of 4 a day Please stop the losartan, blood pressure is low  Labwork:  No new labs needed  Testing/Procedures:  No further testing at this time   I recommend watching educational videos on topics of interest to you at:       [email or web address]  Enter code: [email or web address]    Follow-Up: It was a pleasure seeing you in the office today. Please call us if you have new issues that need to be addressed before your next appt.  [phone]  Your physician wants you to follow-up in: 6 months.  You will receive a reminder letter in the mail two months in advance. If you don't receive a letter, please call our office to schedule the follow-up appointment.  If you need a refill on your cardiac medications before your next appointment, please call your pharmacy.

## 2016-01-27 NOTE — Telephone Encounter (Signed)
Patient advised of appointment with [facility].   Dr Ronita Hipps 02/03/16 @ 12:15 [facility] 3-2-third floor [place] [id number] [phone]  Patient has agreed to appointment and was given the number to call if this needs to be changed.

## 2016-02-07 DIAGNOSIS — J449 Chronic obstructive pulmonary disease, unspecified: Secondary | ICD-10-CM | POA: Diagnosis not present

## 2016-02-07 DIAGNOSIS — R0689 Other abnormalities of breathing: Secondary | ICD-10-CM | POA: Diagnosis not present

## 2016-02-07 DIAGNOSIS — Z96659 Presence of unspecified artificial knee joint: Secondary | ICD-10-CM | POA: Diagnosis not present

## 2016-02-07 DIAGNOSIS — M5136 Other intervertebral disc degeneration, lumbar region: Secondary | ICD-10-CM | POA: Diagnosis not present

## 2016-02-10 DIAGNOSIS — R112 Nausea with vomiting, unspecified: Secondary | ICD-10-CM | POA: Diagnosis not present

## 2016-03-01 DIAGNOSIS — N390 Urinary tract infection, site not specified: Secondary | ICD-10-CM | POA: Diagnosis not present

## 2016-03-01 DIAGNOSIS — R300 Dysuria: Secondary | ICD-10-CM | POA: Diagnosis not present

## 2016-03-01 DIAGNOSIS — Z79899 Other long term (current) drug therapy: Secondary | ICD-10-CM | POA: Diagnosis not present

## 2016-03-09 DIAGNOSIS — Z96659 Presence of unspecified artificial knee joint: Secondary | ICD-10-CM | POA: Diagnosis not present

## 2016-03-09 DIAGNOSIS — M5136 Other intervertebral disc degeneration, lumbar region: Secondary | ICD-10-CM | POA: Diagnosis not present

## 2016-03-09 DIAGNOSIS — R0689 Other abnormalities of breathing: Secondary | ICD-10-CM | POA: Diagnosis not present

## 2016-03-09 DIAGNOSIS — J449 Chronic obstructive pulmonary disease, unspecified: Secondary | ICD-10-CM | POA: Diagnosis not present

## 2016-04-09 DIAGNOSIS — M5136 Other intervertebral disc degeneration, lumbar region: Secondary | ICD-10-CM | POA: Diagnosis not present

## 2016-04-09 DIAGNOSIS — R0689 Other abnormalities of breathing: Secondary | ICD-10-CM | POA: Diagnosis not present

## 2016-04-09 DIAGNOSIS — Z96659 Presence of unspecified artificial knee joint: Secondary | ICD-10-CM | POA: Diagnosis not present

## 2016-04-09 DIAGNOSIS — J449 Chronic obstructive pulmonary disease, unspecified: Secondary | ICD-10-CM | POA: Diagnosis not present

## 2016-04-26 DIAGNOSIS — E032 Hypothyroidism due to medicaments and other exogenous substances: Secondary | ICD-10-CM | POA: Diagnosis not present

## 2016-04-26 DIAGNOSIS — Z79899 Other long term (current) drug therapy: Secondary | ICD-10-CM | POA: Diagnosis not present

## 2016-05-01 ENCOUNTER — Inpatient Hospital Stay
Admission: EM | Admit: 2016-05-01 | Discharge: 2016-05-06 | DRG: 246 | Disposition: A | Discharge status: Skilled Nursing Facility | Payer: Medicare HMO | Attending: Internal Medicine | Admitting: Internal Medicine

## 2016-05-01 ENCOUNTER — Inpatient Hospital Stay (HOSPITAL_COMMUNITY)
Admit: 2016-05-01 | Discharge: 2016-05-01 | Disposition: A | Discharge status: Home / Self Care | Payer: Medicare HMO | Attending: Internal Medicine | Admitting: Internal Medicine

## 2016-05-01 ENCOUNTER — Encounter
Admission: EM | Disposition: A | Discharge status: Skilled Nursing Facility | Payer: Self-pay | Source: Home / Self Care | Attending: Internal Medicine

## 2016-05-01 ENCOUNTER — Encounter: Payer: Self-pay | Admitting: Emergency Medicine

## 2016-05-01 ENCOUNTER — Emergency Department: Payer: Medicare HMO

## 2016-05-01 DIAGNOSIS — I213 ST elevation (STEMI) myocardial infarction of unspecified site: Secondary | ICD-10-CM | POA: Diagnosis not present

## 2016-05-01 DIAGNOSIS — Z823 Family history of stroke: Secondary | ICD-10-CM | POA: Diagnosis not present

## 2016-05-01 DIAGNOSIS — R001 Bradycardia, unspecified: Secondary | ICD-10-CM | POA: Diagnosis not present

## 2016-05-01 DIAGNOSIS — R079 Chest pain, unspecified: Secondary | ICD-10-CM | POA: Diagnosis not present

## 2016-05-01 DIAGNOSIS — Z86718 Personal history of other venous thrombosis and embolism: Secondary | ICD-10-CM

## 2016-05-01 DIAGNOSIS — G4733 Obstructive sleep apnea (adult) (pediatric): Secondary | ICD-10-CM | POA: Diagnosis present

## 2016-05-01 DIAGNOSIS — I272 Pulmonary hypertension, unspecified: Secondary | ICD-10-CM | POA: Diagnosis present

## 2016-05-01 DIAGNOSIS — Z833 Family history of diabetes mellitus: Secondary | ICD-10-CM

## 2016-05-01 DIAGNOSIS — Z90710 Acquired absence of both cervix and uterus: Secondary | ICD-10-CM | POA: Diagnosis not present

## 2016-05-01 DIAGNOSIS — C84A0 Cutaneous T-cell lymphoma, unspecified, unspecified site: Secondary | ICD-10-CM | POA: Diagnosis present

## 2016-05-01 DIAGNOSIS — N179 Acute kidney failure, unspecified: Secondary | ICD-10-CM | POA: Diagnosis not present

## 2016-05-01 DIAGNOSIS — I480 Paroxysmal atrial fibrillation: Secondary | ICD-10-CM | POA: Diagnosis present

## 2016-05-01 DIAGNOSIS — Z96652 Presence of left artificial knee joint: Secondary | ICD-10-CM | POA: Diagnosis present

## 2016-05-01 DIAGNOSIS — G20 Parkinson's disease: Secondary | ICD-10-CM | POA: Diagnosis present

## 2016-05-01 DIAGNOSIS — M797 Fibromyalgia: Secondary | ICD-10-CM | POA: Diagnosis present

## 2016-05-01 DIAGNOSIS — E782 Mixed hyperlipidemia: Secondary | ICD-10-CM | POA: Diagnosis not present

## 2016-05-01 DIAGNOSIS — E785 Hyperlipidemia, unspecified: Secondary | ICD-10-CM | POA: Diagnosis present

## 2016-05-01 DIAGNOSIS — I2102 ST elevation (STEMI) myocardial infarction involving left anterior descending coronary artery: Secondary | ICD-10-CM | POA: Diagnosis not present

## 2016-05-01 DIAGNOSIS — I421 Obstructive hypertrophic cardiomyopathy: Secondary | ICD-10-CM | POA: Diagnosis present

## 2016-05-01 DIAGNOSIS — K219 Gastro-esophageal reflux disease without esophagitis: Secondary | ICD-10-CM | POA: Diagnosis present

## 2016-05-01 DIAGNOSIS — R739 Hyperglycemia, unspecified: Secondary | ICD-10-CM | POA: Diagnosis not present

## 2016-05-01 DIAGNOSIS — Z87891 Personal history of nicotine dependence: Secondary | ICD-10-CM

## 2016-05-01 DIAGNOSIS — I2510 Atherosclerotic heart disease of native coronary artery without angina pectoris: Secondary | ICD-10-CM | POA: Diagnosis present

## 2016-05-01 DIAGNOSIS — Z86711 Personal history of pulmonary embolism: Secondary | ICD-10-CM | POA: Diagnosis not present

## 2016-05-01 DIAGNOSIS — K5900 Constipation, unspecified: Secondary | ICD-10-CM | POA: Diagnosis not present

## 2016-05-01 DIAGNOSIS — E039 Hypothyroidism, unspecified: Secondary | ICD-10-CM | POA: Diagnosis present

## 2016-05-01 DIAGNOSIS — Z7982 Long term (current) use of aspirin: Secondary | ICD-10-CM | POA: Diagnosis not present

## 2016-05-01 DIAGNOSIS — Z9981 Dependence on supplemental oxygen: Secondary | ICD-10-CM

## 2016-05-01 DIAGNOSIS — I5032 Chronic diastolic (congestive) heart failure: Secondary | ICD-10-CM | POA: Diagnosis present

## 2016-05-01 DIAGNOSIS — J439 Emphysema, unspecified: Secondary | ICD-10-CM | POA: Diagnosis present

## 2016-05-01 DIAGNOSIS — Z7951 Long term (current) use of inhaled steroids: Secondary | ICD-10-CM

## 2016-05-01 DIAGNOSIS — J449 Chronic obstructive pulmonary disease, unspecified: Secondary | ICD-10-CM | POA: Diagnosis not present

## 2016-05-01 DIAGNOSIS — Z8249 Family history of ischemic heart disease and other diseases of the circulatory system: Secondary | ICD-10-CM | POA: Diagnosis not present

## 2016-05-01 DIAGNOSIS — I959 Hypotension, unspecified: Secondary | ICD-10-CM | POA: Diagnosis not present

## 2016-05-01 DIAGNOSIS — E43 Unspecified severe protein-calorie malnutrition: Secondary | ICD-10-CM | POA: Diagnosis present

## 2016-05-01 DIAGNOSIS — Z7902 Long term (current) use of antithrombotics/antiplatelets: Secondary | ICD-10-CM | POA: Diagnosis not present

## 2016-05-01 DIAGNOSIS — I509 Heart failure, unspecified: Secondary | ICD-10-CM | POA: Diagnosis not present

## 2016-05-01 DIAGNOSIS — Z95818 Presence of other cardiac implants and grafts: Secondary | ICD-10-CM | POA: Diagnosis not present

## 2016-05-01 DIAGNOSIS — I429 Cardiomyopathy, unspecified: Secondary | ICD-10-CM | POA: Diagnosis not present

## 2016-05-01 DIAGNOSIS — I110 Hypertensive heart disease with heart failure: Secondary | ICD-10-CM | POA: Diagnosis present

## 2016-05-01 DIAGNOSIS — I10 Essential (primary) hypertension: Secondary | ICD-10-CM | POA: Diagnosis not present

## 2016-05-01 DIAGNOSIS — Z743 Need for continuous supervision: Secondary | ICD-10-CM | POA: Diagnosis not present

## 2016-05-01 DIAGNOSIS — R0602 Shortness of breath: Secondary | ICD-10-CM | POA: Diagnosis present

## 2016-05-01 DIAGNOSIS — R6889 Other general symptoms and signs: Secondary | ICD-10-CM | POA: Diagnosis not present

## 2016-05-01 DIAGNOSIS — Z66 Do not resuscitate: Secondary | ICD-10-CM | POA: Diagnosis present

## 2016-05-01 DIAGNOSIS — Z6831 Body mass index (BMI) 31.0-31.9, adult: Secondary | ICD-10-CM

## 2016-05-01 HISTORY — PX: CORONARY STENT INTERVENTION: CATH118234

## 2016-05-01 HISTORY — PX: LEFT HEART CATH AND CORONARY ANGIOGRAPHY: CATH118249

## 2016-05-01 LAB — MRSA PCR SCREENING: MRSA by PCR: NEGATIVE

## 2016-05-01 LAB — CBC
HEMATOCRIT: 36.3 % (ref 35.0–47.0)
HEMOGLOBIN: 12.7 g/dL (ref 12.0–16.0)
MCH: 32.1 pg (ref 26.0–34.0)
MCHC: 35.2 g/dL (ref 32.0–36.0)
MCV: 91.2 fL (ref 80.0–100.0)
Platelets: 220 10*3/uL (ref 150–440)
RBC: 3.97 MIL/uL (ref 3.80–5.20)
RDW: 14.9 % — ABNORMAL HIGH (ref 11.5–14.5)
WBC: 5.8 10*3/uL (ref 3.6–11.0)

## 2016-05-01 LAB — BASIC METABOLIC PANEL
Anion gap: 7 (ref 5–15)
BUN: 17 mg/dL (ref 6–20)
CHLORIDE: 97 mmol/L — AB (ref 101–111)
CO2: 33 mmol/L — ABNORMAL HIGH (ref 22–32)
CREATININE: 1.04 mg/dL — AB (ref 0.44–1.00)
Calcium: 9.6 mg/dL (ref 8.9–10.3)
GFR calc Af Amer: 57 mL/min — ABNORMAL LOW (ref >60)
GFR calc non Af Amer: 49 mL/min — ABNORMAL LOW (ref >60)
GLUCOSE: 134 mg/dL — AB (ref 65–99)
Potassium: 3.5 mmol/L (ref 3.5–5.1)
SODIUM: 137 mmol/L (ref 135–145)

## 2016-05-01 LAB — HEPATIC FUNCTION PANEL
ALK PHOS: 89 U/L (ref 38–126)
ALT: 6 U/L — ABNORMAL LOW (ref 14–54)
AST: 24 U/L (ref 15–41)
Albumin: 4.7 g/dL (ref 3.5–5.0)
TOTAL PROTEIN: 7.4 g/dL (ref 6.5–8.1)
Total Bilirubin: 0.5 mg/dL (ref 0.3–1.2)

## 2016-05-01 LAB — ECHOCARDIOGRAM COMPLETE
HEIGHTINCHES: 60 in
WEIGHTICAEL: 2578.5 [oz_av]

## 2016-05-01 LAB — BRAIN NATRIURETIC PEPTIDE: B NATRIURETIC PEPTIDE 5: 112 pg/mL — AB (ref 0.0–100.0)

## 2016-05-01 LAB — TROPONIN I
TROPONIN I: 0.03 ng/mL — AB (ref <0.03)
TROPONIN I: 1.53 ng/mL — AB (ref <0.03)
TROPONIN I: 1.05 ng/mL — AB (ref <0.03)
TROPONIN I: 0.91 ng/mL — AB (ref <0.03)

## 2016-05-01 LAB — CARDIAC CATHETERIZATION: Cath EF Quantitative: 45 %

## 2016-05-01 LAB — LIPASE, BLOOD: Lipase: 16 U/L (ref 11–51)

## 2016-05-01 SURGERY — LEFT HEART CATH AND CORONARY ANGIOGRAPHY
Anesthesia: Moderate Sedation

## 2016-05-01 MED ORDER — PRAVASTATIN SODIUM 40 MG PO TABS: 40.0000 mg | ORAL_TABLET | Freq: Every day | ORAL | Status: DC

## 2016-05-01 MED ORDER — ORAL CARE MOUTH RINSE
15.0000 mL | Freq: Two times a day (BID) | OROMUCOSAL | Status: DC
  Administered 2016-05-01 – 2016-05-04 (×4): 15 mL via OROMUCOSAL

## 2016-05-01 MED ORDER — FAMOTIDINE IN NACL 20-0.9 MG/50ML-% IV SOLN: 20.0000 mg | Freq: Two times a day (BID) | INTRAVENOUS | Status: DC

## 2016-05-01 MED ORDER — FENTANYL CITRATE (PF) 100 MCG/2ML IJ SOLN
INTRAMUSCULAR | Status: DC | PRN
  Administered 2016-05-01 (×2): 25 ug via INTRAVENOUS

## 2016-05-01 MED ORDER — FAMOTIDINE 20 MG PO TABS
20.0000 mg | ORAL_TABLET | ORAL | Status: DC
  Administered 2016-05-02 – 2016-05-04 (×2): 20 mg via ORAL
  Filled 2016-05-01 (×2): qty 1

## 2016-05-01 MED ORDER — SODIUM CHLORIDE 0.9 % IV SOLN
0.2500 mg/kg/h | INTRAVENOUS | Status: AC
  Filled 2016-05-01: qty 250

## 2016-05-01 MED ORDER — ONDANSETRON HCL 4 MG/2ML IJ SOLN
4.0000 mg | Freq: Four times a day (QID) | INTRAMUSCULAR | Status: DC | PRN
  Filled 2016-05-01: qty 2

## 2016-05-01 MED ORDER — FAMOTIDINE 10 MG PO TABS
ORAL_TABLET | ORAL | Status: DC | PRN
  Administered 2016-05-01: 05:00:00
  Administered 2016-05-01: 20 mg via ORAL

## 2016-05-01 MED ORDER — NITROGLYCERIN 1 MG/10 ML FOR IR/CATH LAB
INTRA_ARTERIAL | Status: DC | PRN
  Administered 2016-05-01: 200 ug via INTRA_ARTERIAL

## 2016-05-01 MED ORDER — DULOXETINE HCL 30 MG PO CPEP
60.0000 mg | ORAL_CAPSULE | Freq: Every day | ORAL | Status: DC
  Administered 2016-05-01 – 2016-05-02 (×2): 60 mg via ORAL
  Filled 2016-05-01 (×2): qty 2
  Filled 2016-05-01: qty 1

## 2016-05-01 MED ORDER — OXYCODONE HCL 5 MG PO TABS
5.0000 mg | ORAL_TABLET | Freq: Four times a day (QID) | ORAL | Status: DC | PRN
  Administered 2016-05-03 – 2016-05-06 (×3): 5 mg via ORAL
  Filled 2016-05-01 (×3): qty 1

## 2016-05-01 MED ORDER — GABAPENTIN 100 MG PO CAPS
400.0000 mg | ORAL_CAPSULE | Freq: Every day | ORAL | Status: DC
  Administered 2016-05-01 – 2016-05-05 (×5): 400 mg via ORAL
  Filled 2016-05-01 (×5): qty 4

## 2016-05-01 MED ORDER — TICAGRELOR 90 MG PO TABS
ORAL_TABLET | ORAL | Status: DC | PRN
  Administered 2016-05-01: 180 mg via ORAL

## 2016-05-01 MED ORDER — MIDAZOLAM HCL 2 MG/2ML IJ SOLN
INTRAMUSCULAR | Status: DC | PRN
Start: 2016-05-01 — End: 2016-05-01
  Administered 2016-05-01: 1 mg via INTRAVENOUS

## 2016-05-01 MED ORDER — SODIUM CHLORIDE 0.9 % IV SOLN
INTRAVENOUS | Status: DC | PRN
  Administered 2016-05-01: 1 mL/kg/h via INTRAVENOUS
  Administered 2016-05-01: 250 mL

## 2016-05-01 MED ORDER — SODIUM CHLORIDE 0.9 % IV SOLN
INTRAVENOUS | Status: DC | PRN
  Administered 2016-05-01: 1.75 mg/kg/h via INTRAVENOUS

## 2016-05-01 MED ORDER — BIVALIRUDIN 250 MG IV SOLR
INTRAVENOUS | Status: AC
  Filled 2016-05-01: qty 250

## 2016-05-01 MED ORDER — HEPARIN (PORCINE) IN NACL 2-0.9 UNIT/ML-% IJ SOLN
INTRAMUSCULAR | Status: DC | PRN
  Administered 2016-05-01: 1000 mL via INTRA_ARTERIAL

## 2016-05-01 MED ORDER — FAMOTIDINE 20 MG PO TABS
ORAL_TABLET | ORAL | Status: AC
  Filled 2016-05-01: qty 1

## 2016-05-01 MED ORDER — DULOXETINE HCL 30 MG PO CPEP
30.0000 mg | ORAL_CAPSULE | Freq: Every day | ORAL | Status: DC
  Administered 2016-05-01 – 2016-05-02 (×2): 30 mg via ORAL
  Filled 2016-05-01 (×3): qty 1

## 2016-05-01 MED ORDER — FLUTICASONE PROPIONATE 50 MCG/ACT NA SUSP
2.0000 | Freq: Every day | NASAL | Status: DC | PRN
  Filled 2016-05-01: qty 16

## 2016-05-01 MED ORDER — MIDAZOLAM HCL 2 MG/2ML IJ SOLN
INTRAMUSCULAR | Status: AC
  Filled 2016-05-01: qty 2

## 2016-05-01 MED ORDER — FENTANYL CITRATE (PF) 100 MCG/2ML IJ SOLN
INTRAMUSCULAR | Status: AC
  Filled 2016-05-01: qty 2

## 2016-05-01 MED ORDER — GABAPENTIN 100 MG PO CAPS
200.0000 mg | ORAL_CAPSULE | Freq: Every day | ORAL | Status: DC
  Administered 2016-05-01 – 2016-05-06 (×6): 200 mg via ORAL
  Filled 2016-05-01 (×7): qty 2

## 2016-05-01 MED ORDER — GABAPENTIN 100 MG PO CAPS: 200.0000 mg | ORAL_CAPSULE | Freq: Three times a day (TID) | ORAL | Status: DC

## 2016-05-01 MED ORDER — ATORVASTATIN CALCIUM 20 MG PO TABS
80.0000 mg | ORAL_TABLET | Freq: Every day | ORAL | Status: DC
Start: 2016-05-01 — End: 2016-05-06
  Administered 2016-05-01 – 2016-05-05 (×5): 80 mg via ORAL
  Filled 2016-05-01 (×5): qty 4

## 2016-05-01 MED ORDER — OXYCODONE-ACETAMINOPHEN 5-325 MG PO TABS
1.0000 | ORAL_TABLET | ORAL | Status: DC | PRN
Start: 2016-05-01 — End: 2016-05-06
  Administered 2016-05-01 – 2016-05-02 (×4): 1 via ORAL
  Administered 2016-05-04: 2 via ORAL
  Filled 2016-05-01 (×4): qty 1
  Filled 2016-05-01: qty 2

## 2016-05-01 MED ORDER — MONTELUKAST SODIUM 10 MG PO TABS
10.0000 mg | ORAL_TABLET | Freq: Every day | ORAL | Status: DC
  Administered 2016-05-01 – 2016-05-05 (×5): 10 mg via ORAL
  Filled 2016-05-01 (×5): qty 1

## 2016-05-01 MED ORDER — DIAZEPAM 2 MG PO TABS
2.0000 mg | ORAL_TABLET | ORAL | Status: DC | PRN
  Administered 2016-05-02: 2 mg via ORAL
  Filled 2016-05-01: qty 1

## 2016-05-01 MED ORDER — CARBIDOPA-LEVODOPA 25-250 MG PO TABS
1.0000 | ORAL_TABLET | Freq: Three times a day (TID) | ORAL | Status: DC
  Administered 2016-05-01 – 2016-05-04 (×10): 1 via ORAL
  Filled 2016-05-01 (×11): qty 1

## 2016-05-01 MED ORDER — BIVALIRUDIN BOLUS VIA INFUSION - CUPID
INTRAVENOUS | Status: DC | PRN
  Administered 2016-05-01: 50.7 mg via INTRAVENOUS

## 2016-05-01 MED ORDER — LIDOCAINE HCL (PF) 1 % IJ SOLN
INTRAMUSCULAR | Status: AC
  Filled 2016-05-01: qty 30

## 2016-05-01 MED ORDER — TICAGRELOR 90 MG PO TABS
90.0000 mg | ORAL_TABLET | Freq: Two times a day (BID) | ORAL | Status: DC
Start: 2016-05-01 — End: 2016-05-06
  Administered 2016-05-01 – 2016-05-06 (×11): 90 mg via ORAL
  Filled 2016-05-01 (×11): qty 1

## 2016-05-01 MED ORDER — ASPIRIN 81 MG PO CHEW
81.0000 mg | CHEWABLE_TABLET | Freq: Every day | ORAL | Status: DC
  Administered 2016-05-01 – 2016-05-03 (×2): 81 mg via ORAL
  Filled 2016-05-01 (×2): qty 1

## 2016-05-01 MED ORDER — LABETALOL HCL 5 MG/ML IV SOLN
INTRAVENOUS | Status: AC
  Filled 2016-05-01: qty 4

## 2016-05-01 MED ORDER — NITROGLYCERIN 0.4 MG SL SUBL
0.4000 mg | SUBLINGUAL_TABLET | SUBLINGUAL | Status: DC | PRN
  Administered 2016-05-01: 0.4 mg via SUBLINGUAL
  Filled 2016-05-01 (×2): qty 1

## 2016-05-01 MED ORDER — SODIUM CHLORIDE 0.9 % IV SOLN: 250.0000 mL | INTRAVENOUS | Status: DC | PRN

## 2016-05-01 MED ORDER — SODIUM CHLORIDE 0.9% FLUSH
3.0000 mL | Freq: Two times a day (BID) | INTRAVENOUS | Status: DC
  Administered 2016-05-01 – 2016-05-06 (×11): 3 mL via INTRAVENOUS

## 2016-05-01 MED ORDER — SENNA 8.6 MG PO TABS
2.0000 | ORAL_TABLET | Freq: Two times a day (BID) | ORAL | Status: DC
  Administered 2016-05-01 – 2016-05-05 (×9): 17.2 mg via ORAL
  Filled 2016-05-01 (×9): qty 2

## 2016-05-01 MED ORDER — IOPAMIDOL (ISOVUE-300) INJECTION 61%
INTRAVENOUS | Status: DC | PRN
Start: 2016-05-01 — End: 2016-05-01
  Administered 2016-05-01: 180 mL via INTRAVENOUS

## 2016-05-01 MED ORDER — TICAGRELOR 90 MG PO TABS
ORAL_TABLET | ORAL | Status: AC
  Filled 2016-05-01: qty 2

## 2016-05-01 MED ORDER — LABETALOL HCL 5 MG/ML IV SOLN: 10.0000 mg | INTRAVENOUS | Status: AC | PRN

## 2016-05-01 MED ORDER — ONDANSETRON HCL 4 MG/2ML IJ SOLN
4.0000 mg | Freq: Four times a day (QID) | INTRAMUSCULAR | Status: DC | PRN
  Administered 2016-05-01: 4 mg via INTRAVENOUS

## 2016-05-01 MED ORDER — PANTOPRAZOLE SODIUM 40 MG PO TBEC
40.0000 mg | DELAYED_RELEASE_TABLET | Freq: Every day | ORAL | Status: DC
  Administered 2016-05-02 – 2016-05-06 (×5): 40 mg via ORAL
  Filled 2016-05-01 (×5): qty 1

## 2016-05-01 MED ORDER — TIOTROPIUM BROMIDE MONOHYDRATE 18 MCG IN CAPS
18.0000 ug | ORAL_CAPSULE | Freq: Every day | RESPIRATORY_TRACT | Status: DC
  Administered 2016-05-01 – 2016-05-06 (×6): 18 ug via RESPIRATORY_TRACT
  Filled 2016-05-01 (×2): qty 5

## 2016-05-01 MED ORDER — ASPIRIN EC 81 MG PO TBEC
81.0000 mg | DELAYED_RELEASE_TABLET | Freq: Every day | ORAL | Status: DC
  Administered 2016-05-02 – 2016-05-06 (×4): 81 mg via ORAL
  Filled 2016-05-01 (×4): qty 1

## 2016-05-01 MED ORDER — HEPARIN (PORCINE) IN NACL 2-0.9 UNIT/ML-% IJ SOLN
INTRAMUSCULAR | Status: AC
  Filled 2016-05-01: qty 1000

## 2016-05-01 MED ORDER — PANTOPRAZOLE SODIUM 40 MG PO TBEC: 40.0000 mg | DELAYED_RELEASE_TABLET | Freq: Every day | ORAL | Status: DC

## 2016-05-01 MED ORDER — HYDRALAZINE HCL 20 MG/ML IJ SOLN: 5.0000 mg | INTRAMUSCULAR | Status: AC | PRN

## 2016-05-01 MED ORDER — LEVOTHYROXINE SODIUM 75 MCG PO TABS
75.0000 ug | ORAL_TABLET | Freq: Every day | ORAL | Status: DC
  Administered 2016-05-02 – 2016-05-06 (×5): 75 ug via ORAL
  Filled 2016-05-01 (×5): qty 1

## 2016-05-01 MED ORDER — MOMETASONE FURO-FORMOTEROL FUM 100-5 MCG/ACT IN AERO
2.0000 | INHALATION_SPRAY | Freq: Two times a day (BID) | RESPIRATORY_TRACT | Status: DC
  Administered 2016-05-01 – 2016-05-06 (×11): 2 via RESPIRATORY_TRACT
  Filled 2016-05-01: qty 8.8

## 2016-05-01 MED ORDER — MORPHINE SULFATE (PF) 2 MG/ML IV SOLN
2.0000 mg | INTRAVENOUS | Status: DC | PRN
  Administered 2016-05-01 – 2016-05-06 (×4): 2 mg via INTRAVENOUS
  Filled 2016-05-01 (×6): qty 1

## 2016-05-01 MED ORDER — PANTOPRAZOLE SODIUM 40 MG IV SOLR
40.0000 mg | Freq: Two times a day (BID) | INTRAVENOUS | Status: DC
Start: 2016-05-01 — End: 2016-05-01

## 2016-05-01 MED ORDER — SODIUM CHLORIDE 0.9 % WEIGHT BASED INFUSION
1.0000 mL/kg/h | INTRAVENOUS | Status: AC
  Administered 2016-05-01: 1 mL/kg/h via INTRAVENOUS

## 2016-05-01 MED ORDER — ACETAMINOPHEN 325 MG PO TABS
650.0000 mg | ORAL_TABLET | ORAL | Status: DC | PRN
  Administered 2016-05-02: 650 mg via ORAL

## 2016-05-01 MED ORDER — NITROGLYCERIN 0.4 MG SL SUBL
0.4000 mg | SUBLINGUAL_TABLET | SUBLINGUAL | Status: DC | PRN
  Administered 2016-05-01 (×2): 0.4 mg via SUBLINGUAL

## 2016-05-01 MED ORDER — ENSURE ENLIVE PO LIQD
237.0000 mL | Freq: Two times a day (BID) | ORAL | Status: DC
  Administered 2016-05-01 – 2016-05-06 (×10): 237 mL via ORAL

## 2016-05-01 MED ORDER — NITROGLYCERIN 5 MG/ML IV SOLN
INTRAVENOUS | Status: AC
Start: 2016-05-01 — End: 2016-05-01
  Filled 2016-05-01: qty 10

## 2016-05-01 MED ORDER — SODIUM CHLORIDE 0.9% FLUSH: 3.0000 mL | INTRAVENOUS | Status: DC | PRN

## 2016-05-01 MED ORDER — ACETAMINOPHEN 325 MG PO TABS
650.0000 mg | ORAL_TABLET | ORAL | Status: DC | PRN
  Administered 2016-05-03: 650 mg via ORAL
  Filled 2016-05-01 (×2): qty 2

## 2016-05-01 MED ORDER — GABAPENTIN 100 MG PO CAPS
300.0000 mg | ORAL_CAPSULE | Freq: Every day | ORAL | Status: DC
  Administered 2016-05-02 – 2016-05-06 (×5): 300 mg via ORAL
  Filled 2016-05-01 (×5): qty 3

## 2016-05-01 MED ORDER — POTASSIUM CHLORIDE CRYS ER 20 MEQ PO TBCR
20.0000 meq | EXTENDED_RELEASE_TABLET | Freq: Two times a day (BID) | ORAL | Status: DC
  Administered 2016-05-01 – 2016-05-06 (×11): 20 meq via ORAL
  Filled 2016-05-01 (×11): qty 1

## 2016-05-01 MED ORDER — LABETALOL HCL 5 MG/ML IV SOLN
INTRAVENOUS | Status: DC | PRN
  Administered 2016-05-01: 10 mg via INTRAVENOUS

## 2016-05-01 SURGICAL SUPPLY — 16 items
BALLN TREK RX 2.5X12 (BALLOONS) ×3
BALLOON TREK RX 2.5X12 (BALLOONS) ×1 IMPLANT
CATH 5FR JR4 DIAGNOSTIC (CATHETERS) ×2 IMPLANT
CATH 5FR PIGTAIL DIAGNOSTIC (CATHETERS) ×3 IMPLANT
CATH VISTA GUIDE 6FR XB3 (CATHETERS) ×3 IMPLANT
DEVICE INFLAT 30 PLUS (MISCELLANEOUS) ×3 IMPLANT
KIT MANI 3VAL PERCEP (MISCELLANEOUS) ×3 IMPLANT
KIT TRANSPAC II SGL 4260605 (MISCELLANEOUS) ×3 IMPLANT
NEEDLE PERC 18GX7CM (NEEDLE) ×3 IMPLANT
PACK CARDIAC CATH (CUSTOM PROCEDURE TRAY) ×3 IMPLANT
PROTECTION STATION PRESSURIZED (MISCELLANEOUS) ×3
SHEATH AVANTI 6FR X 11CM (SHEATH) ×3 IMPLANT
STATION PROTECTION PRESSURIZED (MISCELLANEOUS) ×1 IMPLANT
STENT XIENCE ALPINE RX 3.25X15 (Permanent Stent) ×3 IMPLANT
WIRE EMERALD 3MM-J .035X150CM (WIRE) ×3 IMPLANT
WIRE G HI TQ BMW 190 (WIRE) ×3 IMPLANT

## 2016-05-01 NOTE — Progress Notes (Signed)
Chaplain was making rounds and did a follow up visit with pt in room IC7. Pt was having heart issues and had a procedure. Pt was using her faith to cope with her illness. Pt was in good spirits and awaiting discharge in a couple of days. Pt was emotionally and spiritually stable.    05/01/16 1120  Clinical Encounter Type  Visited With Patient  Visit Type Follow-up  Referral From Nurse  Consult/Referral To Chaplain  Spiritual Encounters  Spiritual Needs Prayer;Emotional

## 2016-05-01 NOTE — ED Triage Notes (Signed)
Pt presents to ED via EMS with c/o mid chest pains with SOB, pain radiates to left shoulder.

## 2016-05-01 NOTE — ED Notes (Addendum)
Pt placed on defib pads. Attempt iv initiation x2 without success. Labs obtained by christine, rn. Labs sent via code stemi by this rn to lab.

## 2016-05-01 NOTE — Progress Notes (Signed)
Patient c/o chest pain radiating up neck. Medications given. Pain decreased. Dr. Clayborn Bigness notified.

## 2016-05-01 NOTE — Progress Notes (Signed)
Right groin sheath pulled, held pressure x 15 mins, applied PAD.

## 2016-05-01 NOTE — Progress Notes (Signed)
ACT 147

## 2016-05-01 NOTE — Progress Notes (Signed)
Dr. Darvin Neighbours notified of critical troponin 1.53. Dr. Darvin Neighbours came to unit to see patient.

## 2016-05-01 NOTE — ED Notes (Signed)
Hold blood thinner at this time until cardiologist decide per Dr. Mable Paris

## 2016-05-01 NOTE — Progress Notes (Signed)
PHARMACIST - PHYSICIAN COMMUNICATION  CONCERNING: IV to Oral Route Change Policy  RECOMMENDATION: This patient is receiving pantoprazole and famotidine by the intravenous route.  Based on criteria approved by the Pharmacy and Therapeutics Committee, the intravenous medication(s) is/are being converted to the equivalent oral dose form(s).   Resume home dose of pantoprazole 40 mg po daily Switch to oral famotidine 20 mg po Q48H (for CrCl less than 50 mL/min).   DESCRIPTION: These criteria include:  The patient is eating (either orally or via tube) and/or has been taking other orally administered medications for a least 24 hours  The patient has no evidence of active gastrointestinal bleeding or impaired GI absorption (gastrectomy, short bowel, patient on TNA or NPO).  If you have questions about this conversion, please contact the Pharmacy Department  []   [phone] )  [facility] [x]   [phone] )  [facility] []   [phone] )  [facility] []   [phone] )  [facility] []   [phone] )  [place], [facility] 05/01/2016 12:59 PM

## 2016-05-01 NOTE — ED Notes (Signed)
Pt placed on cardiac monitor and zoll.

## 2016-05-01 NOTE — H&P (Signed)
Hailey [name] at [place] NAME: Hailey [name]    MR#:  [id number]  DATE OF BIRTH:  1932-04-10  DATE OF ADMISSION:  05/01/2016  PRIMARY CARE PHYSICIAN: BABAOFF, Caryl Bis, MD   REQUESTING/REFERRING PHYSICIAN:   CHIEF COMPLAINT:   Chief Complaint  Patient presents with  . Code STEMI    HISTORY OF PRESENT ILLNESS: Hailey [name]  is a 83 y.o. female with a known history of Bronchial asthma, arthritis, congestive heart failure, COPD, deep venous thrombosis during pregnancy fibromyalgia GERD,, hyperlipidemia, hypertension, hypertrophic cardiomyopathy, paroxysmal atrial fibrillation resented to the emergency room with code STEMI. Patient had chest pain this morning around 2 AM. She had sharp pain in the chest and the left side of the chest which was 8 out of 10 on a scale of 1-10. EMS was called and patient was brought to the emergency room. ST elevation MI was noted. Patient went to cardiac Cath Lab and had cardiac catheterization and cardiac stent was placed in proximal LAD. Her chest pain has improved but still has some tightness. Patient also complained of some nausea and vomiting for the last couple of days. She had some tingling in the left arm for the last 1 week. Also had an episode of shortness of breath when she experienced chest pain this morning. Patient tolerated the procedure well. Hospitalist service was consulted for further admission by cardiology. Patient does not want any cardiac resuscitation and intubation and mechanical ventilation the need arises. Patient is DO NOT RESUSCITATE by CODE STATUS.  PAST MEDICAL HISTORY:   Past Medical History:  Diagnosis Date  . Anemia    Chronic  . Anxiety   . Arthritis   . Asthma   . Atrophic vaginitis   . Cerebral aneurysm    Hx of  . CHF (congestive heart failure) ([place])   . COPD (chronic obstructive pulmonary disease) ([place])   . Diverticulitis   . DVT (deep vein thrombosis) in pregnancy ([place]) 09/2008    a. LLE, recurrent hx, has IVC filter. Not on coumadin now with history of GI bleeding  . Fibrocystic disease of breast   . Fibromyalgia   . GERD (gastroesophageal reflux disease)   . Gout   . Gross hematuria   . Heart murmur   . History of colonoscopy   . Hyperlipidemia   . Hypertension   . Hypertrophic cardiomyopathy ([place])    a. Echo 4/09 with EF 70-75%, asymmetricy basal septal hypertrophy, mild MR without systolic anterior motion of the mitral valve, LVOT gradient to 130 mmHg with Valsalva b. Echo 7/13: EF 60-65%, mild focal basal septal hypertrophy, no significant LVOT gradient, no MV SAM c. Echo 2/15: EF 55-60%, HOCM, resting LVOT gradient 29 mmHg, Valsalva LVOT gradient > 140 mmHg, mild LVH, mild TR, elevated PASP  . Incomplete bladder emptying   . Migraine headache    Hx of  . Obesity   . OSA (obstructive sleep apnea)    a. Intolerant to CPAP, wears 2L via n/c  . Osteoarthritis    Hx of left TKR  . PAF (paroxysmal atrial fibrillation) ([place])    a. Full-dose ASA alone 2/2 h/o GIB.  Marland Kitchen PAT (paroxysmal atrial tachycardia) ([place])   . 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 Pneumonia   . PUD (peptic ulcer disease)    With GI bleeding  . Pulmonary embolism ([place])   . Tremor     PAST SURGICAL HISTORY: Past Surgical  History:  Procedure Laterality Date  . ABDOMINAL HYSTERECTOMY    . APPENDECTOMY    . BLADDER SUSPENSION    . CARDIAC CATHETERIZATION  2009   No significant CAD  . CARDIOVASCULAR STRESS TEST     a. Lexiscan Myoview 3/14: EF 76%, no evidence of ischemia or WMAs  . cataract surgery    . CEREBRAL ANEURYSM REPAIR  1998  . ESOPHAGOGASTRODUODENOSCOPY (EGD) WITH PROPOFOL N/A 11/24/2015   Procedure: ESOPHAGOGASTRODUODENOSCOPY (EGD) WITH PROPOFOL;  Surgeon: Lollie Sails, MD;  Location: [facility]COPY;  Service: Endoscopy;  Laterality: N/A;  . FOOT SURGERY     Bilateral  . INGUINAL HERNIA REPAIR  1968   Right  . KNEE ARTHROSCOPY   2009   Right  . SHOULDER SURGERY  1972   Right   Left 2012  . TONSILLECTOMY AND ADENOIDECTOMY    . TOTAL KNEE ARTHROPLASTY     Left    SOCIAL HISTORY:  Social History  Substance Use Topics  . Smoking status: Former Smoker    Packs/day: 1.00    Years: 60.00    Types: Cigarettes    Quit date: 12/15/2008  . Smokeless tobacco: Never Used     Comment: smoked for 50 years quit 5 + years  . Alcohol use 0.0 oz/week     Comment: Socially    FAMILY HISTORY:  Family History  Problem Relation Age of Onset  . Pancreatic cancer Mother   . Hypertension Mother     father  . Diabetes Mellitus II Mother     Sister  . Colon cancer Father   . Colon polyps Father   . Heart disease Father   . Stroke Father   . Heart disease Sister     More than 1 sister  . Colon cancer Brother   . Colon polyps Other     Siblings  . Ulcers Brother     DRUG ALLERGIES:  Allergies  Allergen Reactions  . Aspirin Other (See Comments)    Reaction:  GI bleeding   . Codeine Nausea And Vomiting  . Compazine [Prochlorperazine] Other (See Comments)    Confusion  . Ketorolac Tromethamine Other (See Comments)    Reaction:  Headache   . Nsaids Other (See Comments)    Reaction:  GI bleeding   . Phenazopyridine Hcl Other (See Comments)    Reaction:  Vision problems   . Macrodantin [Nitrofurantoin Macrocrystal] Rash    REVIEW OF SYSTEMS:   CONSTITUTIONAL: No fever, fatigue or weakness.  EYES: No blurred or double vision.  EARS, NOSE, AND THROAT: No tinnitus or ear pain.  RESPIRATORY: No cough, an episode of shortness of breath,  No wheezing or hemoptysis.  CARDIOVASCULAR: Had chest pain,  No orthopnea, edema.  GASTROINTESTINAL: No nausea, vomiting, diarrhea or abdominal pain.  GENITOURINARY: No dysuria, hematuria.  ENDOCRINE: No polyuria, nocturia,  HEMATOLOGY: No anemia, easy bruising or bleeding SKIN: No rash or lesion. MUSCULOSKELETAL: No joint pain or arthritis.   NEUROLOGIC: No tingling,  numbness, weakness.  PSYCHIATRY: No anxiety or depression.   MEDICATIONS AT HOME:  Prior to Admission medications   Medication Sig Start Date End Date Taking? Authorizing Provider  acetaminophen (TYLENOL) 325 MG tablet Take 2 tablets (650 mg total) by mouth every 6 (six) hours as needed for mild pain or moderate pain (temp > 101.5). 05/25/15   Nicholes Mango, MD  albuterol (PROVENTIL HFA;VENTOLIN HFA) 108 (90 Base) MCG/ACT inhaler Inhale 1 puff into the lungs every 6 (six) hours as needed for  wheezing or shortness of breath.    Historical Provider, MD  albuterol (PROVENTIL) (2.5 MG/3ML) 0.083% nebulizer solution Take 3 mLs (2.5 mg total) by nebulization every 4 (four) hours as needed for wheezing or shortness of breath. 05/25/15   Nicholes Mango, MD  amiodarone (PACERONE) 200 MG tablet Take 1 tablet (200 mg total) by mouth daily. Pt is able to take an additional tablet if needed for breakthrough arrhythmias. 08/04/15   Minna Merritts, MD  Carbidopa-Levodopa ER (SINEMET CR) 25-100 MG tablet controlled release Take 1 tablet by mouth 2 (two) times daily. 05/25/15   Nicholes Mango, MD  dimenhyDRINATE (DRAMAMINE) 50 MG tablet Take 25-50 mg by mouth every 6 (six) hours as needed for dizziness.     Historical Provider, MD  docusate sodium (COLACE) 100 MG capsule Take 100 mg by mouth 2 (two) times daily as needed for mild constipation.    Historical Provider, MD  DULoxetine (CYMBALTA) 30 MG capsule Take 30 mg by mouth daily. Pt takes with a 60mg  capsule.    Historical Provider, MD  DULoxetine (CYMBALTA) 60 MG capsule Take 60 mg by mouth daily. Pt takes with a 30mg  capsule.    Historical Provider, MD  feeding supplement, ENSURE ENLIVE, (ENSURE ENLIVE) LIQD Take 237 mLs by mouth 2 (two) times daily between meals. 01/28/15   Bettey Costa, MD  fluticasone (FLONASE) 50 MCG/ACT nasal spray Place 2 sprays into both nostrils daily as needed for rhinitis.    Historical Provider, MD  gabapentin (NEURONTIN) 100 MG capsule Take  200-400 mg by mouth 3 (three) times daily. Pt takes three capsules in the morning, two capsules in the evening, and four capsules at bedtime.    Historical Provider, MD  levothyroxine (SYNTHROID, LEVOTHROID) 75 MCG tablet Take 75 mcg by mouth daily before breakfast.    Historical Provider, MD  lovastatin (MEVACOR) 40 MG tablet Take 40 mg by mouth at bedtime.    Historical Provider, MD  mometasone-formoterol (DULERA) 100-5 MCG/ACT AERO Inhale 2 puffs into the lungs 2 (two) times daily. 05/25/15   Nicholes Mango, MD  montelukast (SINGULAIR) 10 MG tablet Take 10 mg by mouth at bedtime.    Historical Provider, MD  nitrofurantoin, macrocrystal-monohydrate, (MACROBID) 100 MG capsule Take 100 mg by mouth 2 (two) times daily.    Historical Provider, MD  ondansetron (ZOFRAN-ODT) 4 MG disintegrating tablet Take 4 mg by mouth every 8 (eight) hours as needed for nausea or vomiting.    Historical Provider, MD  oxyCODONE (OXY IR/ROXICODONE) 5 MG immediate release tablet Take 1 tablet (5 mg total) by mouth every 6 (six) hours as needed for moderate pain or severe pain. 05/25/15   Nicholes Mango, MD  pantoprazole (PROTONIX) 40 MG tablet Take 40 mg by mouth daily.    Historical Provider, MD  polyethylene glycol (MIRALAX / GLYCOLAX) packet Take 17 g by mouth daily as needed for moderate constipation.     Historical Provider, MD  potassium chloride (K-DUR,KLOR-CON) 10 MEQ tablet Take 2 tablets (20 mEq total) by mouth 2 (two) times daily. 01/18/16   Minna Merritts, MD  senna (SENOKOT) 8.6 MG TABS tablet Take 2 tablets (17.2 mg total) by mouth 2 (two) times daily. 05/25/15   Nicholes Mango, MD  tiotropium (SPIRIVA) 18 MCG inhalation capsule Place 18 mcg into inhaler and inhale daily.    Historical Provider, MD  torsemide (DEMADEX) 20 MG tablet Take 1 tablet (20 mg total) by mouth 2 (two) times daily. 10/24/15   Gladstone Lighter, MD  triamcinolone ointment (KENALOG) 0.1 % Apply 1 application topically 2 (two) times daily as needed (for  rash).     Historical Provider, MD      PHYSICAL EXAMINATION:   VITAL SIGNS: Blood pressure (!) 127/58, pulse (!) 46, temperature 98.4 F (36.9 C), temperature source Oral, resp. rate 15, height 5' (1.524 m), weight 73.1 kg (161 lb 2.5 oz), SpO2 100 %.  GENERAL:  83 y.o.-year-old patient lying in the bed with no acute distress.  EYES: Pupils equal, round, reactive to light and accommodation. No scleral icterus. Extraocular muscles intact.  HEENT: Head atraumatic, normocephalic. Oropharynx and nasopharynx clear.  NECK:  Supple, no jugular venous distention. No thyroid enlargement, no tenderness.  LUNGS: Normal breath sounds bilaterally, no wheezing, rales,rhonchi or crepitation. No use of accessory muscles of respiration.  CARDIOVASCULAR: S1, S2 normal. No murmurs, rubs, or gallops.  ABDOMEN: Soft, nontender, nondistended. Bowel sounds present. No organomegaly or mass.  EXTREMITIES: No pedal edema, cyanosis, or clubbing.  NEUROLOGIC: Cranial nerves II through XII are intact. Muscle strength 5/5 in all extremities. Sensation intact. Gait not checked.  PSYCHIATRIC: The patient is alert and oriented x 3.  SKIN: No obvious rash, lesion, or ulcer.   LABORATORY PANEL:   CBC  Recent Labs Lab 05/01/16 0258  WBC 5.8  HGB 12.7  HCT 36.3  PLT 220  MCV 91.2  MCH 32.1  MCHC 35.2  RDW 14.9*   ------------------------------------------------------------------------------------------------------------------  Chemistries   Recent Labs Lab 05/01/16 0258  NA 137  K 3.5  CL 97*  CO2 33*  GLUCOSE 134*  BUN 17  CREATININE 1.04*  CALCIUM 9.6  AST 24  ALT 6*  ALKPHOS 89  BILITOT 0.5   ------------------------------------------------------------------------------------------------------------------ estimated creatinine clearance is 37.8 mL/min (A) (by C-G formula based on SCr of 1.04 mg/dL  (H)). ------------------------------------------------------------------------------------------------------------------ No results for input(s): TSH, T4TOTAL, T3FREE, THYROIDAB in the last 72 hours.  Invalid input(s): FREET3   Coagulation profile No results for input(s): INR, PROTIME in the last 168 hours. ------------------------------------------------------------------------------------------------------------------- No results for input(s): DDIMER in the last 72 hours. -------------------------------------------------------------------------------------------------------------------  Cardiac Enzymes  Recent Labs Lab 05/01/16 0258  TROPONINI 0.03*   ------------------------------------------------------------------------------------------------------------------ Invalid input(s): POCBNP  ---------------------------------------------------------------------------------------------------------------  Urinalysis    Component Value Date/Time   COLORURINE STRAW (A) 12/04/2015 1407   APPEARANCEUR CLEAR (A) 12/04/2015 1407   APPEARANCEUR Hazy (A) 08/07/2014 1124   LABSPEC 1.003 (L) 12/04/2015 1407   LABSPEC 1.014 01/14/2014 1543   PHURINE 9.0 (H) 12/04/2015 1407   GLUCOSEU NEGATIVE 12/04/2015 1407   GLUCOSEU >=500 01/14/2014 1543   GLUCOSEU NEGATIVE 09/17/2008 1107   HGBUR NEGATIVE 12/04/2015 1407   BILIRUBINUR NEGATIVE 12/04/2015 1407   BILIRUBINUR Negative 08/07/2014 1124   BILIRUBINUR Negative 01/14/2014 1543   KETONESUR NEGATIVE 12/04/2015 1407   PROTEINUR NEGATIVE 12/04/2015 1407   UROBILINOGEN 0.2 09/17/2008 1107   NITRITE NEGATIVE 12/04/2015 1407   LEUKOCYTESUR NEGATIVE 12/04/2015 1407   LEUKOCYTESUR 1+ (A) 08/07/2014 1124   LEUKOCYTESUR Negative 01/14/2014 1543     RADIOLOGY: Dg Chest Port 1 View  Result Date: 05/01/2016 CLINICAL DATA:  83 year old female with chest pain.  Likely cardiac. EXAM: PORTABLE CHEST 1 VIEW COMPARISON:  Chest radiograph dated  12/04/2015 FINDINGS: Mild emphysematous changes of the lungs with interstitial coarsening. Minimal left lung base atelectasis/ scarring. No focal consolidation, pleural effusion, or pneumothorax. The cardiac silhouette is within normal limits. The aorta is mildly tortuous. Osteopenia with degenerative changes of the spine. No acute osseous pathology. IMPRESSION: No active disease. Electronically Signed  By: Anner Crete M.D.   On: 05/01/2016 03:26    EKG: Orders placed or performed during the hospital encounter of 05/01/16  . EKG 12-Lead  . EKG 12-Lead  . ED EKG  . ED EKG  . EKG 12-Lead  . EKG 12-Lead  . EKG 12-Lead immediately post procedure  . EKG 12-Lead  . EKG 12-Lead immediately post procedure  . EKG 12-Lead    IMPRESSION AND PLAN: 83 year old elderly female patient with history of proximal atrial fibrillation, cardiomyopathy, COPD, congestive heart failure, GERD presented to the emergency room with chest pain. Code STEMI was called and patient underwent cardiac catheterization and stent was placed in proximal LAD. Admitting diagnosis 1. ST elevation myocardial infarction 2. Cardiomyopathy 3. Emphysema 4. Congestive heart failure Treatment plan Admit patient to ICU Continue IV bivalirudin drip Continue oral aspirin and Brilinta Cycle troponin and when necessary nitrates and a IV morphine for pain Cardiology follow-up Clear liquid diet Sequential compression devices to lower extremities Supportive care    All the records are reviewed and case discussed with ED provider. Management plans discussed with the patient, family and they are in agreement.  CODE STATUS:DNR Surrogate Decision Maker : Daughter    Code Status Orders        Start     Ordered   05/01/16 [phone]  Do not attempt resuscitation (DNR)  Continuous    Question Answer Comment  In the event of cardiac or respiratory ARREST Do not call a "code blue"   In the event of cardiac or respiratory ARREST Do  not perform Intubation, CPR, defibrillation or ACLS   In the event of cardiac or respiratory ARREST Use medication by any route, position, wound care, and other measures to relive pain and suffering. May use oxygen, suction and manual treatment of airway obstruction as needed for comfort.      05/01/16 0624    Code Status History    Date Active Date Inactive Code Status Order ID Comments User Context   05/01/2016  5:19 AM 05/01/2016  6:24 AM Full Code [id number]  Yolonda Kida, MD Inpatient   10/22/2015  8:40 PM 10/24/2015 11:40 PM Full Code [id number]  Vaughan Basta, MD Inpatient   05/21/2015 10:34 AM 05/25/2015  5:36 PM DNR [id number]  Flora Lipps, MD Inpatient   05/19/2015  4:56 PM 05/21/2015 10:34 AM Full Code [id number]  Flora Lipps, MD ED   01/27/2015  5:23 PM 01/28/2015  4:52 PM Full Code [id number]  [name], MD ED    Advance Directive Documentation     Most Recent Value  Type of Advance Directive  Out of facility DNR (pink MOST or yellow form)  Pre-existing out of facility DNR order (yellow form or pink MOST form)  -  "MOST" Form in Place?  -       TOTAL TIME TAKING CARE OF THIS PATIENT: 50  minutes.    Saundra Shelling M.D on 05/01/2016 at 6:25 AM  Between 7am to 6pm - Pager - [phone]  After 6pm go to [email or web address] - password [email or web address] [facility] [place] [facility]  Office  [phone]  CC: Primary care physician; BABAOFF, Caryl Bis, MD

## 2016-05-01 NOTE — Consult Note (Signed)
STEMI Anterior Wall Pain 1.5 hrs Came in via rescue ST elevation ant/lat Got ASA/NTG in route In ER EKG and Pain improved Brought to cath lab Hemodynamically stable  Cath R groin 81f Lmain ok LAD 95 prox /99 distal Circ OK RCA large OK LVF 45% apical Ak  Intervention PCI Stent to prox LAD 3.25 x 15 mm xience 11 atm 95->0% Distal LAD not intervened to small and tourtuous Transferred to ICU CP improved Sheath out around 9am Case discussed with hospitalist Dr PEstanislado Pandy

## 2016-05-01 NOTE — Progress Notes (Signed)
Critical Lab value.  Troponin 1.05.  Trop trending down based on last value.

## 2016-05-01 NOTE — Consult Note (Signed)
Reason for Consult: STEMI Referring Physician: Dr. Rogelia Mire primary, [facility] ER Cardiologist Dr. Alyssa Grove is an 83 y.o. female.  HPI: Patient presents with acute onset of chest discomfort about an hour and a half prior to presentation. Patient was at rest and started having midsternal chest discomfort radiating to the neck back arm. Patient was short of breath had some mild diaphoresis. Pain did not resolve with 8 out of 10 so she called rescue. Patient had EKG consistent with STEMI in rescue was given aspirin and sublingual nitroglycerin brought to the ER for evaluation. Patient was still having chest pain emergency room so was referred to cardiac catheter for emergent invasive procedure. Patient gives a history of significant bleeding in the past from her GI tract. Patient has significant reflux symptoms and schedule C with GI Dr. Rob Hickman in April. Patient's had a history of trouble with chronic pain and issues with narcotics in the past. Patient status post IVC filter because of inability to tolerate anticoagulation and a previous history of DVT Patient has a history of IHSS ejection fraction around 55% no evidence of obstruction. Patient now here for follow-up evaluation and therapy.  Past Medical History:  Diagnosis Date  . Anemia    Chronic  . Anxiety   . Arthritis   . Asthma   . Atrophic vaginitis   . Cerebral aneurysm    Hx of  . CHF (congestive heart failure) ([place])   . COPD (chronic obstructive pulmonary disease) ([place])   . Diverticulitis   . DVT (deep vein thrombosis) in pregnancy ([place]) 09/2008   a. LLE, recurrent hx, has IVC filter. Not on coumadin now with history of GI bleeding  . Fibrocystic disease of breast   . Fibromyalgia   . GERD (gastroesophageal reflux disease)   . Gout   . Gross hematuria   . Heart murmur   . History of colonoscopy   . Hyperlipidemia   . Hypertension   . Hypertrophic cardiomyopathy ([place])    a. Echo 4/09 with EF 70-75%, asymmetricy basal  septal hypertrophy, mild MR without systolic anterior motion of the mitral valve, LVOT gradient to 130 mmHg with Valsalva b. Echo 7/13: EF 60-65%, mild focal basal septal hypertrophy, no significant LVOT gradient, no MV SAM c. Echo 2/15: EF 55-60%, HOCM, resting LVOT gradient 29 mmHg, Valsalva LVOT gradient > 140 mmHg, mild LVH, mild TR, elevated PASP  . Incomplete bladder emptying   . Migraine headache    Hx of  . Obesity   . OSA (obstructive sleep apnea)    a. Intolerant to CPAP, wears 2L via n/c  . Osteoarthritis    Hx of left TKR  . PAF (paroxysmal atrial fibrillation) ([place])    a. Full-dose ASA alone 2/2 h/o GIB.  Marland Kitchen PAT (paroxysmal atrial tachycardia) ([place])   . 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 Pneumonia   . PUD (peptic ulcer disease)    With GI bleeding  . Pulmonary embolism ([place])   . Tremor     Past Surgical History:  Procedure Laterality Date  . ABDOMINAL HYSTERECTOMY    . APPENDECTOMY    . BLADDER SUSPENSION    . CARDIAC CATHETERIZATION  2009   No significant CAD  . CARDIOVASCULAR STRESS TEST     a. Lexiscan Myoview 3/14: EF 76%, no evidence of ischemia or WMAs  . cataract surgery    . CEREBRAL ANEURYSM REPAIR  1998  . ESOPHAGOGASTRODUODENOSCOPY (EGD) WITH PROPOFOL N/A  11/24/2015   Procedure: ESOPHAGOGASTRODUODENOSCOPY (EGD) WITH PROPOFOL;  Surgeon: Lollie Sails, MD;  Location[facility] ENDOSCOPY;  Service: Endoscopy;  Laterality: N/A;  . FOOT SURGERY     Bilateral  . INGUINAL HERNIA REPAIR  1968   Right  . KNEE ARTHROSCOPY  2009   Right  . SHOULDER SURGERY  1972   Right   Left 2012  . TONSILLECTOMY AND ADENOIDECTOMY    . TOTAL KNEE ARTHROPLASTY     Left    Family History  Problem Relation Age of Onset  . Pancreatic cancer Mother   . Hypertension Mother     father  . Diabetes Mellitus II Mother     Sister  . Colon cancer Father   . Colon polyps Father   . Heart disease Father   . Stroke Father   . Heart disease  Sister     More than 1 sister  . Colon cancer Brother   . Colon polyps Other     Siblings  . Ulcers Brother     Social History:  reports that she quit smoking about 7 years ago. Her smoking use included Cigarettes. She has a 60.00 pack-year smoking history. She has never used smokeless tobacco. She reports that she drinks alcohol. She reports that she does not use drugs.  Allergies:  Allergies  Allergen Reactions  . Aspirin Other (See Comments)    Reaction:  GI bleeding   . Codeine Nausea And Vomiting  . Compazine [Prochlorperazine] Other (See Comments)    Confusion  . Ketorolac Tromethamine Other (See Comments)    Reaction:  Headache   . Nsaids Other (See Comments)    Reaction:  GI bleeding   . Phenazopyridine Hcl Other (See Comments)    Reaction:  Vision problems   . Macrodantin [Nitrofurantoin Macrocrystal] Rash    Medications: I have reviewed the patient's current medications.  Results for orders placed or performed during the hospital encounter of 05/01/16 (from the past 48 hour(s))  Basic metabolic panel     Status: Abnormal   Collection Time: 05/01/16  2:58 AM  Result Value Ref Range   Sodium 137 135 - 145 mmol/L   Potassium 3.5 3.5 - 5.1 mmol/L   Chloride 97 (L) 101 - 111 mmol/L   CO2 33 (H) 22 - 32 mmol/L   Glucose, Bld 134 (H) 65 - 99 mg/dL   BUN 17 6 - 20 mg/dL   Creatinine, Ser 1.04 (H) 0.44 - 1.00 mg/dL   Calcium 9.6 8.9 - 10.3 mg/dL   GFR calc non Af Amer 49 (L) >60 mL/min   GFR calc Af Amer 57 (L) >60 mL/min    Comment: (NOTE) The eGFR has been calculated using the CKD EPI equation. This calculation has not been validated in all clinical situations. eGFR's persistently <60 mL/min signify possible Chronic Kidney Disease.    Anion gap 7 5 - 15  Hepatic function panel     Status: Abnormal   Collection Time: 05/01/16  2:58 AM  Result Value Ref Range   Total Protein 7.4 6.5 - 8.1 g/dL   Albumin 4.7 3.5 - 5.0 g/dL   AST 24 15 - 41 U/L   ALT 6 (L) 14 -  54 U/L   Alkaline Phosphatase 89 38 - 126 U/L   Total Bilirubin 0.5 0.3 - 1.2 mg/dL   Bilirubin, Direct <0.1 (L) 0.1 - 0.5 mg/dL   Indirect Bilirubin NOT CALCULATED 0.3 - 0.9 mg/dL  Troponin I  Status: Abnormal   Collection Time: 05/01/16  2:58 AM  Result Value Ref Range   Troponin I 0.03 (HH) <0.03 ng/mL    Comment: CRITICAL RESULT CALLED TO, READ BACK BY AND VERIFIED WITH [facility] AT 0330 05/01/16.PMH  Lipase, blood     Status: None   Collection Time: 05/01/16  2:58 AM  Result Value Ref Range   Lipase 16 11 - 51 U/L  Brain natriuretic peptide     Status: Abnormal   Collection Time: 05/01/16  2:58 AM  Result Value Ref Range   B Natriuretic Peptide 112.0 (H) 0.0 - 100.0 pg/mL  CBC     Status: Abnormal   Collection Time: 05/01/16  2:58 AM  Result Value Ref Range   WBC 5.8 3.6 - 11.0 K/uL   RBC 3.97 3.80 - 5.20 MIL/uL   Hemoglobin 12.7 12.0 - 16.0 g/dL   HCT 36.3 35.0 - 47.0 %   MCV 91.2 80.0 - 100.0 fL   MCH 32.1 26.0 - 34.0 pg   MCHC 35.2 32.0 - 36.0 g/dL   RDW 14.9 (H) 11.5 - 14.5 %   Platelets 220 150 - 440 K/uL  MRSA PCR Screening     Status: None   Collection Time: 05/01/16  5:22 AM  Result Value Ref Range   MRSA by PCR NEGATIVE NEGATIVE    Comment:        The GeneXpert MRSA Assay (FDA approved for NASAL specimens only), is one component of a comprehensive MRSA colonization surveillance program. It is not intended to diagnose MRSA infection nor to guide or monitor treatment for MRSA infections.   Troponin I (q 6hr x 3)     Status: Abnormal   Collection Time: 05/01/16  8:37 AM  Result Value Ref Range   Troponin I 1.53 (HH) <0.03 ng/mL    Comment: CRITICAL RESULT CALLED TO, READ BACK BY AND VERIFIED WITH KIM SMYTHE @ 5784 05/01/16 BY [facility]   Troponin I (q 6hr x 3)     Status: Abnormal   Collection Time: 05/01/16  3:33 PM  Result Value Ref Range   Troponin I 1.05 (HH) <0.03 ng/mL    Comment: CRITICAL RESULT CALLED TO, READ BACK BY AND VERIFIED WITH ADAM  Poca @ 1610 05/01/16 BY [facility]     Dg Chest Port 1 View  Result Date: 05/01/2016 CLINICAL DATA:  83 year old female with chest pain.  Likely cardiac. EXAM: PORTABLE CHEST 1 VIEW COMPARISON:  Chest radiograph dated 12/04/2015 FINDINGS: Mild emphysematous changes of the lungs with interstitial coarsening. Minimal left lung base atelectasis/ scarring. No focal consolidation, pleural effusion, or pneumothorax. The cardiac silhouette is within normal limits. The aorta is mildly tortuous. Osteopenia with degenerative changes of the spine. No acute osseous pathology. IMPRESSION: No active disease. Electronically Signed   By: Anner Crete M.D.   On: 05/01/2016 03:26    Review of Systems  Constitutional: Positive for diaphoresis and malaise/fatigue.  HENT: Positive for congestion.   Eyes: Negative.   Respiratory: Positive for shortness of breath.   Cardiovascular: Positive for chest pain, palpitations, orthopnea and leg swelling.  Gastrointestinal: Negative.   Genitourinary: Negative.   Musculoskeletal: Positive for joint pain and myalgias.  Skin: Negative.   Neurological: Positive for weakness.  Endo/Heme/Allergies: Negative.   Psychiatric/Behavioral: Positive for substance abuse.   Blood pressure (!) 109/34, pulse (!) 51, temperature 98 F (36.7 C), temperature source Oral, resp. rate 16, height 5' (1.524 m), weight 73.1 kg (161 lb 2.5 oz), SpO2 97 %.  Physical Exam  Nursing note and vitals reviewed. Constitutional: She is oriented to person, place, and time.  HENT:  Head: Normocephalic and atraumatic.  Right Ear: External ear normal.  Eyes: Conjunctivae and EOM are normal. Pupils are equal, round, and reactive to light.  Neck: Normal range of motion. Neck supple.  Cardiovascular: Normal rate.   Respiratory: Effort normal and breath sounds normal.  GI: Soft. Bowel sounds are normal.  Musculoskeletal: Normal range of motion.  Neurological: She is alert and oriented to person,  place, and time. She has normal reflexes.  Skin: Skin is warm and dry.  Psychiatric: She has a normal mood and affect.    Assessment/Plan: Acute STEMI anterior wall Coronary artery disease Hypertension Parkinson's History of GI bleed DVT PE in the past GERD Thyroid disease IHSS without obstruction . Plan Agree with STEMI Recommend invasive strategy cardiac catheter possible PCI and stent Pain control with narcotics Aspirin Brilinta Follow-up troponins and EKGs Continue inhalers for mild COPD Protonix and Pepcid for GERD Agree with lipid management with statin Continue Sinemet for Parkinson's disease Agree with thyroid therapy for chronic disease Have the patient referred to primary cardiologist Dr. Lora Paula D [facility] 05/01/2016, 9:29 PM

## 2016-05-01 NOTE — Progress Notes (Signed)
[place] responded to a Code STEMI for a Pt in ED Rm 26. When [facility] arrived the medical team was evaluating the Pt. Pt was alert and oriented. Pt responded to questions asked by the MT. Pt was moved to [employer]. Pt's family was contacted, her son arrived first, and the daughter came later. [place] spent time with the son, talked about softball, and other subjects. Pt moved to [place]. [place] escorted Pt's son and daughter to ICU family waiting Rm. [facility] provided support, encouragement and ministry of presence for the Pt and her family. [place] also informed the Pt's family that [facility] available if needed.    05/01/16 0500  Clinical Encounter Type  Visited With Patient;Patient and family together  Visit Type Initial  Referral From Nurse  Consult/Referral To Chaplain  Spiritual Encounters  Spiritual Needs Prayer;Emotional;Other (Comment)

## 2016-05-01 NOTE — ED Notes (Signed)
Per EMS, mid chest pain with SOB. STMI at V3-V4.

## 2016-05-01 NOTE — ED Notes (Signed)
Cardiologist arrived at bedside  

## 2016-05-01 NOTE — ED Provider Notes (Signed)
[facility] Emergency Department Provider Note  ____________________________________________   First MD Initiated Contact with Patient 05/01/16 0259     (approximate)  I have reviewed the triage vital signs and the nursing notes.   HISTORY  Chief Complaint Chest Pain and Shortness of Breath    HPI Hailey [name] is a 83 y.o. female who comes to the emergency department as a prehospital STEMI. She called EMS after sustaining roughly 1 hour of epigastric burning pressure-like discomfort. Initially she thought it was indigestion and she took a Tums but it did not relieve her symptoms. She has had some shortness of breath. Her pain is been constant since it started. She has not tried to exert herself. She has a past medical history of hypertrophic obstructive cardiomyopathy, hypertension, and COPD. She received 324 mg of aspirin en route but no nitroglycerin.The patient said her pain started when walking back from the bathroom but it did not change with rest. She did have a cardiac catheterization "years ago".   Past Medical History:  Diagnosis Date  . Anemia    Chronic  . Anxiety   . Arthritis   . Asthma   . Atrophic vaginitis   . Cerebral aneurysm    Hx of  . CHF (congestive heart failure) ([place])   . COPD (chronic obstructive pulmonary disease) ([place])   . Diverticulitis   . DVT (deep vein thrombosis) in pregnancy ([place]) 09/2008   a. LLE, recurrent hx, has IVC filter. Not on coumadin now with history of GI bleeding  . Fibrocystic disease of breast   . Fibromyalgia   . GERD (gastroesophageal reflux disease)   . Gout   . Gross hematuria   . Heart murmur   . History of colonoscopy   . Hyperlipidemia   . Hypertension   . Hypertrophic cardiomyopathy ([place])    a. Echo 4/09 with EF 70-75%, asymmetricy basal septal hypertrophy, mild MR without systolic anterior motion of the mitral valve, LVOT gradient to 130 mmHg with Valsalva b. Echo 7/13: EF 60-65%, mild  focal basal septal hypertrophy, no significant LVOT gradient, no MV SAM c. Echo 2/15: EF 55-60%, HOCM, resting LVOT gradient 29 mmHg, Valsalva LVOT gradient > 140 mmHg, mild LVH, mild TR, elevated PASP  . Incomplete bladder emptying   . Migraine headache    Hx of  . Obesity   . OSA (obstructive sleep apnea)    a. Intolerant to CPAP, wears 2L via n/c  . Osteoarthritis    Hx of left TKR  . PAF (paroxysmal atrial fibrillation) ([place])    a. Full-dose ASA alone 2/2 h/o GIB.  Marland Kitchen PAT (paroxysmal atrial tachycardia) ([place])   . 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 Pneumonia   . PUD (peptic ulcer disease)    With GI bleeding  . Pulmonary embolism ([place])   . Tremor     Patient Active Problem List   Diagnosis Date Noted  . Protein-calorie malnutrition, severe 10/23/2015  . Uncontrollable vomiting   . Nausea and vomiting 10/22/2015  . Tremor 07/07/2015  . Respiratory failure ([place]) 05/19/2015  . Weakness 01/27/2015  . Abdomen enlarged 09/26/2013  . CTCL (cutaneous T-cell lymphoma) ([place]) 09/18/2013  . Anxiety 08/09/2013  . CCF (congestive cardiac failure) ([place]) 08/09/2013  . Chronic pain 08/09/2013  . Chronic diastolic CHF (congestive heart failure) ([place]) 07/12/2013  . Benign essential HTN 07/01/2013  . Bilateral leg edema 06/06/2013  . Paroxysmal atrial fibrillation ([place]) 04/25/2013  .  DOE (dyspnea on exertion) 10/11/2011  . COPD (chronic obstructive pulmonary disease) ([place]) 10/09/2011  . Cognitive decline 10/09/2011  . Behavior concern 08/23/2011  . Chest pain 10/26/2010  . Palpitations 10/26/2010  . PHLEBITIS AND THROMBOPHLEBITIS OF FEMORAL VEIN 10/13/2008  . DEEP VENOUS THROMBOPHLEBITIS, LEG, LEFT 10/09/2008  . GASTRIC ULCER, ACUTE 10/09/2008  . PERSONAL HX COLONIC POLYPS 09/02/2008  . FATIGUE 08/27/2008  . HOCM (hypertrophic obstructive cardiomyopathy) ([place]) 11/06/2007  . HYPOTENSION, ORTHOSTATIC 11/06/2007  . ADENOMATOUS COLONIC POLYP  04/12/2007  . HIATAL HERNIA 04/12/2007  . Diverticulosis of colon (without mention of hemorrhage) 04/12/2007  . Hyperlipidemia 02/03/2007  . ANEMIA, CHRONIC 02/03/2007  . MIGRAINE HEADACHE 02/03/2007  . Essential hypertension 02/03/2007  . CEREBRAL ANEURYSM 02/03/2007  . GASTROESOPHAGEAL REFLUX DISEASE 02/03/2007  . OSTEOARTHRITIS 02/03/2007  . FIBROMYALGIA 02/03/2007  . CHRONIC FATIGUE SYNDROME 02/03/2007  . MITRAL VALVE PROLAPSE, HX OF 02/03/2007  . PULMONARY EMBOLISM, HX OF 02/03/2007  . TOTAL KNEE REPLACEMENT, LEFT, HX OF 02/03/2007  . HYSTERECTOMY, HX OF 02/03/2007  . Other acquired absence of organ 02/03/2007  . INGUINAL HERNIORRHAPHY, RIGHT, HX OF 02/03/2007    Past Surgical History:  Procedure Laterality Date  . ABDOMINAL HYSTERECTOMY    . APPENDECTOMY    . BLADDER SUSPENSION    . CARDIAC CATHETERIZATION  2009   No significant CAD  . CARDIOVASCULAR STRESS TEST     a. Lexiscan Myoview 3/14: EF 76%, no evidence of ischemia or WMAs  . cataract surgery    . CEREBRAL ANEURYSM REPAIR  1998  . ESOPHAGOGASTRODUODENOSCOPY (EGD) WITH PROPOFOL N/A 11/24/2015   Procedure: ESOPHAGOGASTRODUODENOSCOPY (EGD) WITH PROPOFOL;  Surgeon: Lollie Sails, MD;  Location[facility] ENDOSCOPY;  Service: Endoscopy;  Laterality: N/A;  . FOOT SURGERY     Bilateral  . INGUINAL HERNIA REPAIR  1968   Right  . KNEE ARTHROSCOPY  2009   Right  . SHOULDER SURGERY  1972   Right   Left 2012  . TONSILLECTOMY AND ADENOIDECTOMY    . TOTAL KNEE ARTHROPLASTY     Left    Prior to Admission medications   Medication Sig Start Date End Date Taking? Authorizing Provider  acetaminophen (TYLENOL) 325 MG tablet Take 2 tablets (650 mg total) by mouth every 6 (six) hours as needed for mild pain or moderate pain (temp > 101.5). 05/25/15   Nicholes Mango, MD  albuterol (PROVENTIL HFA;VENTOLIN HFA) 108 (90 Base) MCG/ACT inhaler Inhale 1 puff into the lungs every 6 (six) hours as needed for wheezing or shortness of  breath.    Historical Provider, MD  albuterol (PROVENTIL) (2.5 MG/3ML) 0.083% nebulizer solution Take 3 mLs (2.5 mg total) by nebulization every 4 (four) hours as needed for wheezing or shortness of breath. 05/25/15   Nicholes Mango, MD  amiodarone (PACERONE) 200 MG tablet Take 1 tablet (200 mg total) by mouth daily. Pt is able to take an additional tablet if needed for breakthrough arrhythmias. 08/04/15   Minna Merritts, MD  Carbidopa-Levodopa ER (SINEMET CR) 25-100 MG tablet controlled release Take 1 tablet by mouth 2 (two) times daily. 05/25/15   Nicholes Mango, MD  dimenhyDRINATE (DRAMAMINE) 50 MG tablet Take 25-50 mg by mouth every 6 (six) hours as needed for dizziness.     Historical Provider, MD  docusate sodium (COLACE) 100 MG capsule Take 100 mg by mouth 2 (two) times daily as needed for mild constipation.    Historical Provider, MD  DULoxetine (CYMBALTA) 30 MG capsule Take 30 mg by mouth daily.  Pt takes with a 60mg  capsule.    Historical Provider, MD  DULoxetine (CYMBALTA) 60 MG capsule Take 60 mg by mouth daily. Pt takes with a 30mg  capsule.    Historical Provider, MD  feeding supplement, ENSURE ENLIVE, (ENSURE ENLIVE) LIQD Take 237 mLs by mouth 2 (two) times daily between meals. 01/28/15   Bettey Costa, MD  fluticasone (FLONASE) 50 MCG/ACT nasal spray Place 2 sprays into both nostrils daily as needed for rhinitis.    Historical Provider, MD  gabapentin (NEURONTIN) 100 MG capsule Take 200-400 mg by mouth 3 (three) times daily. Pt takes three capsules in the morning, two capsules in the evening, and four capsules at bedtime.    Historical Provider, MD  levothyroxine (SYNTHROID, LEVOTHROID) 75 MCG tablet Take 75 mcg by mouth daily before breakfast.    Historical Provider, MD  lovastatin (MEVACOR) 40 MG tablet Take 40 mg by mouth at bedtime.    Historical Provider, MD  mometasone-formoterol (DULERA) 100-5 MCG/ACT AERO Inhale 2 puffs into the lungs 2 (two) times daily. 05/25/15   Nicholes Mango, MD    montelukast (SINGULAIR) 10 MG tablet Take 10 mg by mouth at bedtime.    Historical Provider, MD  nitrofurantoin, macrocrystal-monohydrate, (MACROBID) 100 MG capsule Take 100 mg by mouth 2 (two) times daily.    Historical Provider, MD  ondansetron (ZOFRAN-ODT) 4 MG disintegrating tablet Take 4 mg by mouth every 8 (eight) hours as needed for nausea or vomiting.    Historical Provider, MD  oxyCODONE (OXY IR/ROXICODONE) 5 MG immediate release tablet Take 1 tablet (5 mg total) by mouth every 6 (six) hours as needed for moderate pain or severe pain. 05/25/15   Nicholes Mango, MD  pantoprazole (PROTONIX) 40 MG tablet Take 40 mg by mouth daily.    Historical Provider, MD  polyethylene glycol (MIRALAX / GLYCOLAX) packet Take 17 g by mouth daily as needed for moderate constipation.     Historical Provider, MD  potassium chloride (K-DUR,KLOR-CON) 10 MEQ tablet Take 2 tablets (20 mEq total) by mouth 2 (two) times daily. 01/18/16   Minna Merritts, MD  senna (SENOKOT) 8.6 MG TABS tablet Take 2 tablets (17.2 mg total) by mouth 2 (two) times daily. 05/25/15   Nicholes Mango, MD  tiotropium (SPIRIVA) 18 MCG inhalation capsule Place 18 mcg into inhaler and inhale daily.    Historical Provider, MD  torsemide (DEMADEX) 20 MG tablet Take 1 tablet (20 mg total) by mouth 2 (two) times daily. 10/24/15   Gladstone Lighter, MD  triamcinolone ointment (KENALOG) 0.1 % Apply 1 application topically 2 (two) times daily as needed (for rash).     Historical Provider, MD    Allergies Aspirin; Codeine; Compazine [prochlorperazine]; Ketorolac tromethamine; Nsaids; Phenazopyridine hcl; and Macrodantin [nitrofurantoin macrocrystal]  Family History  Problem Relation Age of Onset  . Pancreatic cancer Mother   . Hypertension Mother     father  . Diabetes Mellitus II Mother     Sister  . Colon cancer Father   . Colon polyps Father   . Heart disease Father   . Stroke Father   . Heart disease Sister     More than 1 sister  . Colon  cancer Brother   . Colon polyps Other     Siblings  . Ulcers Brother     Social History Social History  Substance Use Topics  . Smoking status: Former Smoker    Packs/day: 1.00    Years: 60.00    Types: Cigarettes  Quit date: 12/15/2008  . Smokeless tobacco: Never Used     Comment: smoked for 50 years quit 5 + years  . Alcohol use 0.0 oz/week     Comment: Socially    Review of Systems Constitutional: No fever/chills Eyes: No visual changes. ENT: No sore throat. Cardiovascular: Positive chest pain. Respiratory: Positive shortness of breath. Gastrointestinal: No abdominal pain.  Positive nausea, no vomiting.  No diarrhea.  No constipation. Genitourinary: Negative for dysuria. Musculoskeletal: Negative for back pain. Skin: Negative for rash. Neurological: Negative for headaches, focal weakness or numbness.  10-point ROS otherwise negative.  ____________________________________________   PHYSICAL EXAM:  VITAL SIGNS: ED Triage Vitals  Enc Vitals Group     BP      Pulse      Resp      Temp      Temp src      SpO2      Weight      Height      Head Circumference      Peak Flow      Pain Score      Pain Loc      Pain Edu?      Excl. in [place]?     Constitutional: Alert and oriented x 4 well appearing nontoxic no diaphoresis speaks in full, clear sentences Eyes: PERRL EOMI. Head: Atraumatic. Nose: No congestion/rhinnorhea. Mouth/Throat: No trismus Neck: No stridor.   Cardiovascular: Normal rate, regular rhythm. Grossly normal heart sounds.  Good peripheral circulation. Respiratory: Normal respiratory effort.  No retractions. Lungs CTAB and moving good air Gastrointestinal: Soft nondistended nontender no rebound no guarding no peritonitis Musculoskeletal: No lower extremity edema   Neurologic:  Normal speech and language. No gross focal neurologic deficits are appreciated. Skin:  Skin is warm, dry and intact. No rash noted. Psychiatric: Mood and affect are  normal. Speech and behavior are normal.    ____________________________________________   DIFFERENTIALST elevation myocardial infarction, non-ST elevation myocardial infarction, unstable angina, gastritis, pancreatitis, acid reflux, aortic dissection, pulmonary embolism   LABS (all labs ordered are listed, but only abnormal results are displayed)  Labs Reviewed  BASIC METABOLIC PANEL  HEPATIC FUNCTION PANEL  TROPONIN I  LIPASE, BLOOD  URINALYSIS, COMPLETE (UACMP) WITH MICROSCOPIC  BRAIN NATRIURETIC PEPTIDE     __________________________________________  EKG  ED ECG REPORT I, Darel Hong, the attending physician, personally viewed and interpreted this ECG.  Date: 05/01/2016 Rate: 62 Rhythm: normal sinus rhythm QRS Axis: normal Intervals: First-degree AV block prolonged QTC no ST elevation ST/T Wave abnormalities: normal Conduction Disturbances: none Narrative Interpretation: Abnormal  ____________________________________________  RADIOLOGY  Chest x-ray with no acute disease ____________________________________________   PROCEDURES  Procedure(s) performed: no  Procedures  Critical Care performed: yes  CRITICAL CARE Performed by: Darel Hong   Total critical care time: 40 minutes  Critical care time was exclusive of separately billable procedures and treating other patients.  Critical care was necessary to treat or prevent imminent or life-threatening deterioration.  Critical care was time spent personally by me on the following activities: development of treatment plan with patient and/or surrogate as well as nursing, discussions with consultants, evaluation of patient's response to treatment, examination of patient, obtaining history from patient or surrogate, ordering and performing treatments and interventions, ordering and review of laboratory studies, ordering and review of radiographic studies, pulse oximetry and re-evaluation of patient's  condition.   ____________________________________________   INITIAL IMPRESSION / ASSESSMENT AND PLAN / ED COURSE  Pertinent labs & imaging results that  were available during my care of the patient were reviewed by me and considered in my medical decision making (see chart for details).  The patient's prehospital EKG shows ST elevation in V3 and V4 which is concerning for ST elevation myocardial infarction. On arrival she is very pleasant and well-appearing and hemodynamically stable although a bit hypertensive. She has had 2 EKGs in our facility 10 minutes apart and both of them are normal without any ST changes.    ----------------------------------------- 3:32 AM on 05/01/2016 -----------------------------------------  Dr. Clayborn Bigness at bedside with like to take the patient for cardiac catheterization now. No heparin or Plavix right now they'll take care of it in the angio suite.  ____________________________________________   FINAL CLINICAL IMPRESSION(S) / ED DIAGNOSES  Final diagnoses:  ST elevation myocardial infarction (STEMI), unspecified artery (HCC)      NEW MEDICATIONS STARTED DURING THIS VISIT:  New Prescriptions   No medications on file     Note:  This document was prepared using Dragon voice recognition software and may include unintentional dictation errors.     Darel Hong, MD 05/01/16 [phone]

## 2016-05-01 NOTE — ED Notes (Signed)
Dr. Clayborn Bigness, cardiologist states to take pt to cath lab, blood thinner will be given in the cath lab.

## 2016-05-01 NOTE — Progress Notes (Signed)
Pt care assumed.  Pt found sitting up in bed, NAD noted.  Pt provided with 1600 meds, denies further need.

## 2016-05-01 NOTE — Progress Notes (Signed)
[place] [facility] [place] NAME: Hailey [name]    MR#:  [id number]  DATE OF BIRTH:  03/21/1932  SUBJECTIVE:  CHIEF COMPLAINT:   Chief Complaint  Patient presents with  . Code STEMI   No further chest pain.  REVIEW OF SYSTEMS:    Review of Systems  Constitutional: Negative for chills and fever.  HENT: Negative for sore throat.   Eyes: Negative for blurred vision, double vision and pain.  Respiratory: Negative for cough, hemoptysis, shortness of breath and wheezing.   Cardiovascular: Negative for chest pain, palpitations, orthopnea and leg swelling.  Gastrointestinal: Negative for abdominal pain, constipation, diarrhea, heartburn, nausea and vomiting.  Genitourinary: Negative for dysuria and hematuria.  Musculoskeletal: Negative for back pain and joint pain.  Skin: Negative for rash.  Neurological: Negative for sensory change, speech change, focal weakness and headaches.  Endo/Heme/Allergies: Does not bruise/bleed easily.  Psychiatric/Behavioral: Negative for depression. The patient is not nervous/anxious.     DRUG ALLERGIES:   Allergies  Allergen Reactions  . Aspirin Other (See Comments)    Reaction:  GI bleeding   . Codeine Nausea And Vomiting  . Compazine [Prochlorperazine] Other (See Comments)    Confusion  . Ketorolac Tromethamine Other (See Comments)    Reaction:  Headache   . Nsaids Other (See Comments)    Reaction:  GI bleeding   . Phenazopyridine Hcl Other (See Comments)    Reaction:  Vision problems   . Macrodantin [Nitrofurantoin Macrocrystal] Rash    VITALS:  Blood pressure 110/66, pulse (!) 49, temperature 98.2 F (36.8 C), temperature source Oral, resp. rate 15, height 5' (1.524 m), weight 73.1 kg (161 lb 2.5 oz), SpO2 96 %.  PHYSICAL EXAMINATION:   Physical Exam  GENERAL:  83 y.o.-year-old patient lying in the bed with no acute distress.  EYES: Pupils equal, round, reactive to light and accommodation. No scleral  icterus. Extraocular muscles intact.  HEENT: Head atraumatic, normocephalic. Oropharynx and nasopharynx clear.  NECK:  Supple, no jugular venous distention. No thyroid enlargement, no tenderness.  LUNGS: Normal breath sounds bilaterally, no wheezing, rales, rhonchi. No use of accessory muscles of respiration.  CARDIOVASCULAR: S1, S2 normal. No murmurs, rubs, or gallops.  ABDOMEN: Soft, nontender, nondistended. Bowel sounds present. No organomegaly or mass.  EXTREMITIES: No cyanosis, clubbing or edema b/l.    NEUROLOGIC: Cranial nerves II through XII are intact. No focal Motor or sensory deficits b/l.   PSYCHIATRIC: The patient is alert and oriented x 3.  SKIN: No obvious rash, lesion, or ulcer.   LABORATORY PANEL:   CBC  Recent Labs Lab 05/01/16 0258  WBC 5.8  HGB 12.7  HCT 36.3  PLT 220   ------------------------------------------------------------------------------------------------------------------ Chemistries   Recent Labs Lab 05/01/16 0258  NA 137  K 3.5  CL 97*  CO2 33*  GLUCOSE 134*  BUN 17  CREATININE 1.04*  CALCIUM 9.6  AST 24  ALT 6*  ALKPHOS 89  BILITOT 0.5   ------------------------------------------------------------------------------------------------------------------  Cardiac Enzymes  Recent Labs Lab 05/01/16 0837  TROPONINI 1.53*   ------------------------------------------------------------------------------------------------------------------  RADIOLOGY:  Dg Chest Port 1 View  Result Date: 05/01/2016 CLINICAL DATA:  83 year old female with chest pain.  Likely cardiac. EXAM: PORTABLE CHEST 1 VIEW COMPARISON:  Chest radiograph dated 12/04/2015 FINDINGS: Mild emphysematous changes of the lungs with interstitial coarsening. Minimal left lung base atelectasis/ scarring. No focal consolidation, pleural effusion, or pneumothorax. The cardiac silhouette is within normal limits. The aorta is mildly tortuous. Osteopenia with  degenerative changes of  the spine. No acute osseous pathology. IMPRESSION: No active disease. Electronically Signed   By: Anner Crete M.D.   On: 05/01/2016 03:26     ASSESSMENT AND PLAN:   83 year old elderly female patient with history of proximal atrial fibrillation, cardiomyopathy, COPD, congestive heart failure, GERD presented to the emergency room with chest pain. Code STEMI was called and patient underwent cardiac catheterization and stent was placed in proximal LAD.  1. ST elevation myocardial infarction 2. Cardiomyopathy 3. Emphysema 4. Congestive heart failure 5. Parkinson's disease  Status post PCI to proximal LAD Continue aspirin, [name], Lipitor. No beta blocker due to bradycardia.  Dr. Clayborn Bigness with cardiology following the patient.  All the records are reviewed and case discussed with Care Management/Social Worker Management plans discussed with the patient, family and they are in agreement.  CODE STATUS: FULL CODE  DVT Prophylaxis: SCDs  TOTAL TIME TAKING CARE OF THIS PATIENT: 20 minutes.   POSSIBLE D/C IN 1-2 DAYS, DEPENDING ON CLINICAL CONDITION.  [name] R M.D on 05/01/2016 at 12:44 PM  Between 7am to 6pm - Pager - [phone]  After 6pm go to [email or web address] - password [email or web address] [facility]  Office  [phone]  CC: Primary care physician; BABAOFF, Caryl Bis, MD  Note: This dictation was prepared with Dragon dictation along with smaller phrase technology. Any transcriptional errors that result from this process are unintentional.

## 2016-05-01 NOTE — ED Notes (Addendum)
324mg  ASA given en-route by EMS

## 2016-05-02 ENCOUNTER — Encounter: Payer: Self-pay | Admitting: Internal Medicine

## 2016-05-02 DIAGNOSIS — I2102 ST elevation (STEMI) myocardial infarction involving left anterior descending coronary artery: Principal | ICD-10-CM

## 2016-05-02 LAB — LIPID PANEL
CHOLESTEROL: 184 mg/dL (ref 0–200)
HDL: 47 mg/dL (ref >40)
LDL Cholesterol: 80 mg/dL (ref 0–99)
Total CHOL/HDL Ratio: 3.9 RATIO
Triglycerides: 286 mg/dL — ABNORMAL HIGH (ref <150)
VLDL: 57 mg/dL — ABNORMAL HIGH (ref 0–40)

## 2016-05-02 LAB — POCT ACTIVATED CLOTTING TIME
ACTIVATED CLOTTING TIME: 147 s
Activated Clotting Time: 334 seconds

## 2016-05-02 LAB — BASIC METABOLIC PANEL
ANION GAP: 7 (ref 5–15)
BUN: 10 mg/dL (ref 6–20)
CALCIUM: 9.4 mg/dL (ref 8.9–10.3)
CO2: 29 mmol/L (ref 22–32)
Chloride: 105 mmol/L (ref 101–111)
Creatinine, Ser: 0.78 mg/dL (ref 0.44–1.00)
Glucose, Bld: 91 mg/dL (ref 65–99)
Potassium: 4.1 mmol/L (ref 3.5–5.1)
Sodium: 141 mmol/L (ref 135–145)

## 2016-05-02 LAB — CBC
HCT: 35.4 % (ref 35.0–47.0)
Hemoglobin: 12.3 g/dL (ref 12.0–16.0)
MCH: 32.2 pg (ref 26.0–34.0)
MCHC: 34.7 g/dL (ref 32.0–36.0)
MCV: 92.8 fL (ref 80.0–100.0)
Platelets: 215 10*3/uL (ref 150–440)
RBC: 3.81 MIL/uL (ref 3.80–5.20)
RDW: 15.1 % — AB (ref 11.5–14.5)
WBC: 5.2 10*3/uL (ref 3.6–11.0)

## 2016-05-02 LAB — GLUCOSE, CAPILLARY: Glucose-Capillary: 121 mg/dL — ABNORMAL HIGH (ref 65–99)

## 2016-05-02 MED ORDER — TORSEMIDE 20 MG PO TABS
20.0000 mg | ORAL_TABLET | Freq: Two times a day (BID) | ORAL | Status: DC
  Administered 2016-05-02 – 2016-05-05 (×7): 20 mg via ORAL
  Filled 2016-05-02 (×7): qty 1

## 2016-05-02 NOTE — Clinical Social Work Note (Signed)
CSW consulted by staff for issues with medication assistance. If medication assistance is needed, RN CM will address this. Shela Leff MSW,LCSW [phone]

## 2016-05-02 NOTE — Progress Notes (Signed)
Pt being transferred to room 254. Report called to Lavella Lemons, Therapist, sports. Pt and belongings transferred to room 254 without incident.

## 2016-05-02 NOTE — Progress Notes (Signed)
Patient Name: Hailey [name] Date of Encounter: 05/02/2016  Primary Cardiologist: [facility] Problem List     Active Problems:   Acute ST elevation myocardial infarction (STEMI) involving left anterior descending (LAD) coronary artery (HCC)   STEMI (ST elevation myocardial infarction) ([place])     Subjective   Presented on the morning of 3/18 with anterior STEMI. Troponin peaked at 1.53, now down trending. Status post PCI/DES to the proximal LAD with residual 99% stenosis along a tortuous distal LAD. Had brief episode of chest pain after the procedure and again this morning, though nowhere near as severe as the pain she had upon presentation. Echo showed normal EF, unable to exclude RWMA. Labs stable. Tolerating DAPT to date.   Inpatient Medications    Scheduled Meds: . aspirin  81 mg Oral Daily  . aspirin EC  81 mg Oral Daily  . atorvastatin  80 mg Oral q1800  . carbidopa-levodopa  1 tablet Oral TID  . DULoxetine  30 mg Oral Daily  . DULoxetine  60 mg Oral Daily  . famotidine  20 mg Oral Q48H  . feeding supplement (ENSURE ENLIVE)  237 mL Oral BID BM  . gabapentin  300 mg Oral Daily   And  . gabapentin  200 mg Oral Daily   And  . gabapentin  400 mg Oral QHS  . levothyroxine  75 mcg Oral QAC breakfast  . mouth rinse  15 mL Mouth Rinse BID  . mometasone-formoterol  2 puff Inhalation BID  . montelukast  10 mg Oral QHS  . pantoprazole  40 mg Oral QAC breakfast  . potassium chloride  20 mEq Oral BID  . senna  2 tablet Oral BID  . sodium chloride flush  3 mL Intravenous Q12H  . ticagrelor  90 mg Oral BID  . tiotropium  18 mcg Inhalation Daily   Continuous Infusions:  PRN Meds: sodium chloride, acetaminophen, acetaminophen, diazepam, fluticasone, morphine injection, nitroGLYCERIN, nitroGLYCERIN, ondansetron (ZOFRAN) IV, ondansetron (ZOFRAN) IV, oxyCODONE, oxyCODONE-acetaminophen, sodium chloride flush   Vital Signs    Vitals:   05/02/16 0300 05/02/16 0400 05/02/16  0500 05/02/16 0600  BP: 99/80 (!) 101/57  (!) 71/39  Pulse: (!) 55 (!) 54 (!) 52 (!) 53  Resp: 18 15 16 15   Temp:  98.2 F (36.8 C)    TempSrc:  Oral    SpO2: (!) 87% 97% 99% 98%  Weight:      Height:        Intake/Output Summary (Last 24 hours) at 05/02/16 0735 Last data filed at 05/02/16 0500  Gross per 24 hour  Intake              200 ml  Output             1550 ml  Net            -1350 ml   Filed Weights   05/01/16 0316 05/01/16 0517  Weight: 149 lb (67.6 kg) 161 lb 2.5 oz (73.1 kg)    Physical Exam    GEN: Well nourished, well developed, in no acute distress.  HEENT: Grossly normal.  Neck: Supple, no JVD, carotid bruits, or masses. Cardiac: RRR, no murmurs, rubs, or gallops. No clubbing, cyanosis, edema.  Radials/DP/PT 2+ and equal bilaterally. Cardiac cath site without bleeding,  Bruising, swelling, erythema, TTP, or bruit.  Respiratory:  Respirations regular and unlabored, clear to auscultation bilaterally. GI: Soft, nontender, nondistended, BS + x 4. MS: no deformity or  atrophy. Skin: warm and dry, no rash. Neuro:  Strength and sensation are intact. Psych: AAOx3.  Normal affect.  Labs    CBC  Recent Labs  05/01/16 0258 05/02/16 0417  WBC 5.8 5.2  HGB 12.7 12.3  HCT 36.3 35.4  MCV 91.2 92.8  PLT 220 409   Basic Metabolic Panel  Recent Labs  05/01/16 0258 05/02/16 0417  NA 137 141  K 3.5 4.1  CL 97* 105  CO2 33* 29  GLUCOSE 134* 91  BUN 17 10  CREATININE 1.04* 0.78  CALCIUM 9.6 9.4   Liver Function Tests  Recent Labs  05/01/16 0258  AST 24  ALT 6*  ALKPHOS 89  BILITOT 0.5  PROT 7.4  ALBUMIN 4.7    Recent Labs  05/01/16 0258  LIPASE 16   Cardiac Enzymes  Recent Labs  05/01/16 0837 05/01/16 1533 05/01/16 2058  TROPONINI 1.53* 1.05* 0.91*   BNP Invalid input(s): POCBNP D-Dimer No results for input(s): DDIMER in the last 72 hours. Hemoglobin A1C No results for input(s): HGBA1C in the last 72 hours. Fasting Lipid  Panel No results for input(s): CHOL, HDL, LDLCALC, TRIG, CHOLHDL, LDLDIRECT in the last 72 hours. Thyroid Function Tests No results for input(s): TSH, T4TOTAL, T3FREE, THYROIDAB in the last 72 hours.  Invalid input(s): FREET3  Telemetry    Sinus bradycardia, 50s bpm - Personally Reviewed  ECG    n/a - Personally Reviewed  Radiology    Dg Chest Port 1 View  Result Date: 05/01/2016 CLINICAL DATA:  83 year old female with chest pain.  Likely cardiac. EXAM: PORTABLE CHEST 1 VIEW COMPARISON:  Chest radiograph dated 12/04/2015 FINDINGS: Mild emphysematous changes of the lungs with interstitial coarsening. Minimal left lung base atelectasis/ scarring. No focal consolidation, pleural effusion, or pneumothorax. The cardiac silhouette is within normal limits. The aorta is mildly tortuous. Osteopenia with degenerative changes of the spine. No acute osseous pathology. IMPRESSION: No active disease. Electronically Signed   By: Anner Crete M.D.   On: 05/01/2016 03:26    Cardiac Studies   LHC 05/01/16: Conclusion     Dist LAD lesion, 99 %stenosed.  Ost 1st Diag to 1st Diag lesion, 50 %stenosed.  There is mild left ventricular systolic dysfunction.  LV end diastolic pressure is normal.  The left ventricular ejection fraction is 45-50% by visual estimate. Apical akinesis  There is trivial (1+) mitral regurgitation.  A STENT XIENCE ALPINE RX 8.11B14 drug eluting stent was successfully placed, and does not overlap previously placed stent.  Prox LAD to Mid LAD lesion, 95 %stenosed.  Post intervention, there is a 0% residual stenosis.   Conclusion Diagnostic cardiac catheter as part of his STEMI which identified a proximal 95% LAD lesion and a distal 99 with a very tortuous size LAD Successful PCI and stent of proximal LAD with DES 3.2515 mm xience Mildly depressed overall left ventricular function with apical akinesis ejection fraction of 45%    TTE 05/01/16: Study  Conclusions  - Procedure narrative: Transthoracic echocardiography. Image   quality was suboptimal. The study was technically difficult. - Left ventricle: Systolic function was normal. The estimated   ejection fraction was in the range of 55% to 60%. Regional wall   motion abnormalities cannot be excluded. - Mitral valve: Severely calcified annulus. - Left atrium: The atrium was mildly dilated.  Patient Profile     83 y.o. female with history of recently diagnosed CAD with possible anterior wall STEMI upon presentation s/p PCI/DES to the proximal LAD with  residual 99% stenosis of tortuous distal LAD, PAF not on full-dose anticoagulation 2/2 prior severe GI bleed, pulmonary HTN, reported HOCM, DVT s/p IVC filter, OSA intolerant to CPAP, HTN, HLD, and persistent nausea and vomiting since summer of 2017 who presented to [facility] with chest pain found to have anterior wall ST elevation MI vs NSTEMI.   Assessment & Plan    1. Anterior wall STEMI/CAD vs NSTEMI: -Currently chest pain free -Troponin peaked at 1.53 -Continue DAPT with ASA 81 mg daily and Brilinta 90 mg bid -Monitor for GI bleeding -Echo with preserved EF, unable to exclude RWMA, cath with apical akinesis  -Bradycardia precludes beta blocker at this time -Check lipid panel for further risk stratification -Check A1c given hyperglycemia -Cardiac rehab  2. PAF: -Currently maintaining sinus rhythm with a bradycardic rate -Not on full dose long term anticoagulation 2/2 prior severe GI bleed -Not on rate controlling medication at this time 2/2 bradycardia -CHADAS2VASc at least 6 (CHF, HTN, age x 2, vascular disease, female) -Would ideally like to avoid triple therapy given her GI bleeding -Previously on amiodarone at home, though sounds like she was having episodes of Afib weekly, not on amiodarone inpatient  -Monitor on telemetry  3. Pulmonary HTN: -Continue home PO torsemide 20 mg bid  4. HLD: -Check lipid panel as  above -Lovastatin changed to Lipitor  5. Hyperglycemia: -Check A1c  6. DVT: -S/p IVC filter  7. GI bleed: -Monitor on DAPT  8. Persistent nausea/vomiting: -Perhaps related to the above -Has appointment with [facility] 4/3  9. OSA: -Intolerant to CPAP -On nighttime oxygen at home at 2 L via nasal cannula  10. Dispo: -Transfer to 2A today -Ambulate with PT -Monitor on telemetry for arrhythmia   Signed, Marcille Blanco [place] Pager: [phone] 05/02/2016, 7:35 AM

## 2016-05-02 NOTE — Evaluation (Signed)
Physical Therapy Evaluation Patient Details Name: Hailey [name] MRN: [id number] DOB: Oct 28, 1932 Today's Date: 05/02/2016   History of Present Illness  Pt is a 83 y/o F who presented to the ED with code STEMI.  Pt reported sharp pain in the chest and L side of chest.  Patient went to cardiac Cath Lab and had cardiac catheterization and cardiac stent was placed in proximal LAD.  Pt has had h/o nausea and vomiting over the past 6 months.  Pt's PMH includes cerebral aneurysm, CHF, COPD, DVT in pregnancy, Fibromyalgia, L TKA, tremor.    Clinical Impression  Pt admitted with above diagnosis. Pt currently with functional limitations due to the deficits listed below (see PT Problem List). Hailey [name] currently requires min assist to steady with sit<>stand transfers and pt scored 24/56 on the [facility] Test, indicating pt is at an increased risk of falling.  Results explained to the pt.  She ambulated 4 ft but was unable to ambulate farther due to onset of 8/10 check pain that radiated to Bil neck regions.  RN notified.  HR 64-78 and pt reported improvement with this reported pain at end of the session.  Pt will benefit from skilled PT to increase their independence and safety with mobility to allow discharge to the venue listed below.      Follow Up Recommendations SNF    Equipment Recommendations  None recommended by PT    Recommendations for Other Services OT consult     Precautions / Restrictions Precautions Precautions: Fall;Other (comment) Precaution Comments: monitor for chest pain Restrictions Weight Bearing Restrictions: No      Mobility  Bed Mobility Overal bed mobility: Needs Assistance Bed Mobility: Supine to Sit     Supine to sit: Modified independent (Device/Increase time)     General bed mobility comments: Increased time and effort but no physical assist or cues needed  Transfers Overall transfer level: Needs assistance Equipment used: Rolling walker (2  wheeled) Transfers: Sit to/from Stand Sit to Stand: Min assist         General transfer comment: Pt demonstrated safe technique using RW.  Min assist to steady with sit<>stand due to posterior bias.    Ambulation/Gait Ambulation/Gait assistance: Min assist Ambulation Distance (Feet): 4 Feet Assistive device: Rolling walker (2 wheeled) Gait Pattern/deviations: Decreased stride length Gait velocity: decreased Gait velocity interpretation: Below normal speed for age/gender General Gait Details: Unsteady requiring minA to steady.  Did not ambulate farther as pt described 8/10 check and Bil neck pain  Stairs            Wheelchair Mobility    Modified Rankin (Stroke Patients Only)       Balance Overall balance assessment: Needs assistance Sitting-balance support: No upper extremity supported;Feet supported Sitting balance-Leahy Scale: Good     Standing balance support: Bilateral upper extremity supported;During functional activity Standing balance-Leahy Scale: Poor Standing balance comment: Relies on UE support to remain steady with static and dynamic activities                 Standardized Balance Assessment Standardized Balance Assessment : Berg Balance Test Berg Balance Test Sit to Stand: Needs minimal aid to stand or to stabilize Standing Unsupported: Able to stand 2 minutes with supervision Sitting with Back Unsupported but Feet Supported on Floor or Stool: Able to sit safely and securely 2 minutes Stand to Sit: Controls descent by using hands Transfers: Able to transfer with verbal cueing and /or supervision Standing Unsupported with Eyes Closed:  Needs help to keep from falling Standing Ubsupported with Feet Together: Needs help to attain position and unable to hold for 15 seconds From Standing, Reach Forward with Outstretched Arm: Can reach forward >12 cm safely (5") From Standing Position, Pick up Object from Floor: Able to pick up shoe, needs  supervision From Standing Position, Turn to Look Behind Over each Shoulder: Looks behind from both sides and weight shifts well Turn 360 Degrees: Needs close supervision or verbal cueing Standing Unsupported, Alternately Place Feet on Step/Stool: Needs assistance to keep from falling or unable to try Standing Unsupported, One Foot in Front: Loses balance while stepping or standing Standing on One Leg: Unable to try or needs assist to prevent fall Total Score: 24         Pertinent Vitals/Pain Pain Assessment: 0-10 Pain Score: 8  (4/10 R knee, up to 8/10 chest and Bil neck ) Pain Location: chronic R knee pain, chest pain radiating up to Bil neck with activity Pain Descriptors / Indicators: Radiating;Sharp;Aching Pain Intervention(s): Limited activity within patient's tolerance;Monitored during session;Repositioned    Home Living Family/patient expects to be discharged to:: Private residence Living Arrangements: Alone Available Help at Discharge: Friend(s);Available PRN/intermittently Type of Home: Independent living facility Home Access: Level entry     Home Layout: One level Home Equipment: Walker - 4 wheels;Cane - quad;Cane - single point;Grab bars - tub/shower;Grab bars - toilet;Shower seat      Prior Function Level of Independence: Independent with assistive device(s)         Comments: Pt has been ambulating with rollator down the hallway at ILF requiring rest breaks when doing so.  She reports she can shower and dress independently but has noticed that she is transitioning to needing assist for showering as this has become more challenging.  She denies any falls in the past 6 months.  All 3 meals provided by ILF.     Hand Dominance        Extremity/Trunk Assessment   Upper Extremity Assessment Upper Extremity Assessment:  (Strength grossly 4-/5 BUE)    Lower Extremity Assessment Lower Extremity Assessment: RLE deficits/detail;LLE deficits/detail RLE Deficits /  Details: knee E 3+/5 due to pain, knee F 4-/5.  Otherwise strength grossly 4+/5 LLE Deficits / Details: R knee F 4-/5, otherwise strength grossly 4/5    Cervical / Trunk Assessment Cervical / Trunk Assessment: Kyphotic  Communication   Communication: No difficulties  Cognition Arousal/Alertness: Awake/alert Behavior During Therapy: WFL for tasks assessed/performed Overall Cognitive Status: Within Functional Limits for tasks assessed                      General Comments General comments (skin integrity, edema, etc.): Pt scored 24/56 on the Berg Balance Test, indicating pt is at an increased risk of falling.  Pt reports h/o (PTA to present) of her feeling like objects in the room are moving when she is still.  This occurs whether sitting or lying still or when standing. Pt has seen her PCP for this who tells her, "You are just moving".  Pt may benefit from a PT Vestibular Evaluation either in acute service or at SNF.    Exercises     Assessment/Plan    PT Assessment Patient needs continued PT services  PT Problem List Decreased strength;Decreased activity tolerance;Decreased balance;Decreased knowledge of use of DME;Decreased safety awareness;Pain       PT Treatment Interventions DME instruction;Gait training;Functional mobility training;Therapeutic activities;Therapeutic exercise;Balance training;Neuromuscular re-education;Patient/family education  PT Goals (Current goals can be found in the Care Plan section)  Acute Rehab PT Goals Patient Stated Goal: to become more independent PT Goal Formulation: With patient Time For Goal Achievement: 05/16/16 Potential to Achieve Goals: Good    Frequency Min 2X/week   Barriers to discharge Inaccessible home environment;Decreased caregiver support Lives alone with no assist available    Co-evaluation               End of Session Equipment Utilized During Treatment: Gait belt Activity Tolerance: Patient limited by  pain Patient left: in chair;with call bell/phone within reach;with chair alarm set Nurse Communication: Mobility status;Other (comment) (pt's report of 8/10 chest and Bil neck pain) PT Visit Diagnosis: Unsteadiness on feet (R26.81);Muscle weakness (generalized) (M62.81)         Time: 2446-2863 PT Time Calculation (min) (ACUTE ONLY): 31 min   Charges:   PT Evaluation $PT Eval Low Complexity: 1 Procedure PT Treatments $Therapeutic Activity: 8-22 mins   PT G Codes:         Collie Siad PT, DPT 05/02/2016, 3:14 PM

## 2016-05-02 NOTE — Progress Notes (Signed)
[facility] [place] NAME: Hailey [name]    MR#:  [id number]  DATE OF BIRTH:  02/26/1932  SUBJECTIVE:  CHIEF COMPLAINT:   Chief Complaint  Patient presents with  . Code STEMI  sitting in chair, feels much better, has some headache (took tylenolol 1 hr ago but hasn't helped yet)  REVIEW OF SYSTEMS:    Review of Systems  Constitutional: Negative for chills and fever.  HENT: Negative for sore throat.   Eyes: Negative for blurred vision, double vision and pain.  Respiratory: Negative for cough, hemoptysis, shortness of breath and wheezing.   Cardiovascular: Negative for chest pain, palpitations, orthopnea and leg swelling.  Gastrointestinal: Negative for abdominal pain, constipation, diarrhea, heartburn, nausea and vomiting.  Genitourinary: Negative for dysuria and hematuria.  Musculoskeletal: Negative for back pain and joint pain.  Skin: Negative for rash.  Neurological: Negative for sensory change, speech change, focal weakness and headaches.  Endo/Heme/Allergies: Does not bruise/bleed easily.  Psychiatric/Behavioral: Negative for depression. The patient is not nervous/anxious.     DRUG ALLERGIES:   Allergies  Allergen Reactions  . Aspirin Other (See Comments)    Reaction:  GI bleeding   . Codeine Nausea And Vomiting  . Compazine [Prochlorperazine] Other (See Comments)    Confusion  . Ketorolac Tromethamine Other (See Comments)    Reaction:  Headache   . Nsaids Other (See Comments)    Reaction:  GI bleeding   . Phenazopyridine Hcl Other (See Comments)    Reaction:  Vision problems   . Macrodantin [Nitrofurantoin Macrocrystal] Rash    VITALS:  Blood pressure (!) 134/53, pulse (!) 54, temperature 98.2 F (36.8 C), temperature source Oral, resp. rate 16, height 5' (1.524 m), weight 73.1 kg (161 lb 2.5 oz), SpO2 100 %.  PHYSICAL EXAMINATION:   Physical Exam  GENERAL:  83 y.o.-year-old patient lying in the bed with no acute  distress.  EYES: Pupils equal, round, reactive to light and accommodation. No scleral icterus. Extraocular muscles intact.  HEENT: Head atraumatic, normocephalic. Oropharynx and nasopharynx clear.  NECK:  Supple, no jugular venous distention. No thyroid enlargement, no tenderness.  LUNGS: Normal breath sounds bilaterally, no wheezing, rales, rhonchi. No use of accessory muscles of respiration.  CARDIOVASCULAR: S1, S2 normal. No murmurs, rubs, or gallops.  ABDOMEN: Soft, nontender, nondistended. Bowel sounds present. No organomegaly or mass.  EXTREMITIES: No cyanosis, clubbing or edema b/l.    NEUROLOGIC: Cranial nerves II through XII are intact. No focal Motor or sensory deficits b/l.   PSYCHIATRIC: The patient is alert and oriented x 3.  SKIN: No obvious rash, lesion, or ulcer.  LABORATORY PANEL:   CBC  Recent Labs Lab 05/02/16 0417  WBC 5.2  HGB 12.3  HCT 35.4  PLT 215   ------------------------------------------------------------------------------------------------------------------ Chemistries   Recent Labs Lab 05/01/16 0258 05/02/16 0417  NA 137 141  K 3.5 4.1  CL 97* 105  CO2 33* 29  GLUCOSE 134* 91  BUN 17 10  CREATININE 1.04* 0.78  CALCIUM 9.6 9.4  AST 24  --   ALT 6*  --   ALKPHOS 89  --   BILITOT 0.5  --    ------------------------------------------------------------------------------------------------------------------  Cardiac Enzymes  Recent Labs Lab 05/01/16 2058  TROPONINI 0.91*   ------------------------------------------------------------------------------------------------------------------  RADIOLOGY:  Dg Chest Port 1 View  Result Date: 05/01/2016 CLINICAL DATA:  83 year old female with chest pain.  Likely cardiac. EXAM: PORTABLE CHEST 1 VIEW COMPARISON:  Chest radiograph dated 12/04/2015 FINDINGS: Mild  emphysematous changes of the lungs with interstitial coarsening. Minimal left lung base atelectasis/ scarring. No focal consolidation,  pleural effusion, or pneumothorax. The cardiac silhouette is within normal limits. The aorta is mildly tortuous. Osteopenia with degenerative changes of the spine. No acute osseous pathology. IMPRESSION: No active disease. Electronically Signed   By: Anner Crete M.D.   On: 05/01/2016 03:26     ASSESSMENT AND PLAN:  83 year old elderly female patient with history of proximal atrial fibrillation, cardiomyopathy, COPD, congestive heart failure, GERD presented to the emergency room with chest pain. Code STEMI was called and patient underwent cardiac catheterization and stent was placed in proximal LAD.  1. Anterior ST elevation myocardial infarction - Status post PCI (DES) to proximal LAD - DAT (Brilanta & ASA) - high dose statin - no b-blocker or ACE-I due to bradycardia/hypotension  2. Cardiomyopathy - resume amio before d/c if HR improves considering h/o paroxysmal A.fib  3. Emphysema: chronic  4. Congestive heart failure: chronic diastolic EF 31-49%  5. Parkinson's disease     All the records are reviewed and case discussed with Care Management/Social Worker Management plans discussed with the patient, NURSING and they are in agreement.  CODE STATUS: FULL CODE  DVT Prophylaxis: SCDs  TOTAL TIME TAKING CARE OF THIS PATIENT: 20 minutes.   POSSIBLE D/C IN 1 DAYS, DEPENDING ON CLINICAL CONDITION.  Max Sane M.D on 05/02/2016 at 5:30 PM  Between 7am to 6pm - Pager - [phone]  After 6pm go to [email or web address] - password [email or web address] [facility]  Office  [phone]  CC: Primary care physician; BABAOFF, Caryl Bis, MD  Note: This dictation was prepared with Dragon dictation along with smaller phrase technology. Any transcriptional errors that result from this process are unintentional.

## 2016-05-02 NOTE — Plan of Care (Signed)
Problem: Safety: Goal: Ability to remain free from injury will improve Outcome: Progressing Fall precautions in place, non skid socks, bed alarm  Problem: Pain Managment: Goal: General experience of comfort will improve Outcome: Not Progressing Prn medications  Problem: Tissue Perfusion: Goal: Risk factors for ineffective tissue perfusion will decrease Outcome: Progressing PO Brilinta  Problem: Activity: Goal: Ability to tolerate increased activity will improve Outcome: Progressing Referral to cardiac rehab  Problem: Cardiac: Goal: Ability to achieve and maintain adequate cardiopulmonary perfusion will improve Outcome: Progressing EF 45-50%

## 2016-05-02 NOTE — Progress Notes (Signed)
Pt transferred out of ICU to room 254 via wc.  A&O x3.  No distress on ra.  Cardiac monitor placed on pt and verified with Lanelle Bal, CNA.  Denies chest pain at this time.  Lungs diminished lower lobes bil.  Dressing to rt groin dry and intact, pulses equal bil.  SL rt ac/ lt fa flushes well.  Oriented to room and surroundings.  Denies need at this time.  CB in reach, SR up x2, bed alarm on.

## 2016-05-02 NOTE — Progress Notes (Signed)
Pt's Daughter Hailey [name] is requesting for pt to be evaluated for SNF rehab, pt's daughter does not want pt to go to indepentdent/assisted living due to History of medication non-compliance.

## 2016-05-03 DIAGNOSIS — I480 Paroxysmal atrial fibrillation: Secondary | ICD-10-CM

## 2016-05-03 LAB — BASIC METABOLIC PANEL
Anion gap: 6 (ref 5–15)
BUN: 13 mg/dL (ref 6–20)
CHLORIDE: 100 mmol/L — AB (ref 101–111)
CO2: 30 mmol/L (ref 22–32)
CREATININE: 0.78 mg/dL (ref 0.44–1.00)
Calcium: 9 mg/dL (ref 8.9–10.3)
GFR calc non Af Amer: >60 mL/min (ref >60)
Glucose, Bld: 106 mg/dL — ABNORMAL HIGH (ref 65–99)
Potassium: 4.1 mmol/L (ref 3.5–5.1)
SODIUM: 136 mmol/L (ref 135–145)

## 2016-05-03 LAB — CBC
HEMATOCRIT: 34.1 % — AB (ref 35.0–47.0)
Hemoglobin: 11.9 g/dL — ABNORMAL LOW (ref 12.0–16.0)
MCH: 32.1 pg (ref 26.0–34.0)
MCHC: 34.8 g/dL (ref 32.0–36.0)
MCV: 92.3 fL (ref 80.0–100.0)
Platelets: 208 10*3/uL (ref 150–440)
RBC: 3.69 MIL/uL — ABNORMAL LOW (ref 3.80–5.20)
RDW: 15 % — AB (ref 11.5–14.5)
WBC: 6.4 10*3/uL (ref 3.6–11.0)

## 2016-05-03 LAB — HEMOGLOBIN A1C
HEMOGLOBIN A1C: 5.3 % (ref 4.8–5.6)
MEAN PLASMA GLUCOSE: 105 mg/dL

## 2016-05-03 MED ORDER — ISOSORBIDE MONONITRATE ER 30 MG PO TB24
30.0000 mg | ORAL_TABLET | Freq: Every day | ORAL | Status: DC
  Administered 2016-05-03: 30 mg via ORAL
  Filled 2016-05-03: qty 1

## 2016-05-03 MED ORDER — DULOXETINE HCL 30 MG PO CPEP
60.0000 mg | ORAL_CAPSULE | Freq: Every day | ORAL | Status: DC
  Administered 2016-05-03 – 2016-05-06 (×4): 60 mg via ORAL
  Filled 2016-05-03 (×3): qty 2

## 2016-05-03 MED ORDER — DULOXETINE HCL 30 MG PO CPEP
30.0000 mg | ORAL_CAPSULE | Freq: Every day | ORAL | Status: DC
  Administered 2016-05-03 – 2016-05-06 (×4): 30 mg via ORAL
  Filled 2016-05-03 (×3): qty 1

## 2016-05-03 NOTE — Progress Notes (Signed)
Initial Nutrition Assessment  DOCUMENTATION CODES:   Severe malnutrition in context of chronic illness  INTERVENTION:  -Continue Ensure Enlive po BID, each supplement provides 350 kcal and 20 grams of protein -Recommend smaller, more frequent meals -Recommend addition of MVI -Pt may benefit from following up with outpatient RD and GI clinic   NUTRITION DIAGNOSIS:   Malnutrition related to nausea, vomiting, chronic illness as evidenced by percent weight loss, severe depletion of muscle mass.  GOAL:   Patient will meet greater than or equal to 90% of their needs  MONITOR:   PO intake, Supplement acceptance, Labs, Weight trends  REASON FOR ASSESSMENT:   Malnutrition Screening Tool    ASSESSMENT:   Hailey [name]  is a 83 y.o. female with a known history of Bronchial asthma, arthritis, congestive heart failure, COPD, deep venous thrombosis during pregnancy fibromyalgia GERD,, hyperlipidemia, hypertension, hypertrophic cardiomyopathy, paroxysmal atrial fibrillation resented to the emergency room with code STEMI  Recorded po intake 0% of meals yesterday; pt ate some breakfast this AM including half a biscuit, part of the eggs/sausage. Pt reports she has been battling chronic N/V for 6 months with significant wt loss 30-40 pounds (15.7% wt loss or more in 6 months which is significant for time frame). Pt reports vomiting at least 1-2 times per day but that the vomiting is not necessarily associated with po intake. Vomiting is so severe that usually she vomits all her food and then dry heaves/vomits bile. Pt unable to quantify exactly what she eats in any given day and of course this is  Pt reports this issues is being worked-up as outpatient and as appt soon with Rob Hickman GI MD   Nutrition-Focused physical exam completed. Findings are mild to moderate fat depletion, mil/moderate to severe muscle depletion, and no edema.   Labs: reviewed Meds: reviewed  Diet Order:  Diet regular Room service  appropriate? Yes; Fluid consistency: Thin  Skin:  Reviewed, no issues  Last BM:  04/30/2016  Height:   Ht Readings from Last 1 Encounters:  05/01/16 5' (1.524 m)    Weight:   Wt Readings from Last 1 Encounters:  05/01/16 161 lb 2.5 oz (73.1 kg)    Ideal Body Weight:  45.45 kg  BMI:  Body mass index is 31.47 kg/m.  Estimated Nutritional Needs:   Kcal:  1500-1800 calories  Protein:  73-88 gm  Fluid:  >/= 1.5L  EDUCATION NEEDS:   Education needs addressed  Kerman Passey MS, [place], LDN [phone] Pager  [phone] Weekend/On-Call Pager

## 2016-05-03 NOTE — Progress Notes (Signed)
Patient Name: Hailey [name] Date of Encounter: 05/03/2016  Primary Cardiologist: [facility] Problem List     Active Problems:   Acute ST elevation myocardial infarction (STEMI) involving left anterior descending (LAD) coronary artery (HCC)   STEMI (ST elevation myocardial infarction) (HCC)     Subjective   Doing well. No acute overnight events. Ambulated with PT on 3/19. Had brief episode of chest pain lasting approximately 5 minutes before self resolving when sitting down. Pain not as severe as presenting pain. Heart rate remains bradycardic. Notes "explosive" BMs. Labs stable.   Inpatient Medications    Scheduled Meds: . aspirin  81 mg Oral Daily  . aspirin EC  81 mg Oral Daily  . atorvastatin  80 mg Oral q1800  . carbidopa-levodopa  1 tablet Oral TID  . DULoxetine  30 mg Oral Daily  . DULoxetine  60 mg Oral Daily  . famotidine  20 mg Oral Q48H  . feeding supplement (ENSURE ENLIVE)  237 mL Oral BID BM  . gabapentin  300 mg Oral Daily   And  . gabapentin  200 mg Oral Daily   And  . gabapentin  400 mg Oral QHS  . isosorbide mononitrate  30 mg Oral Daily  . levothyroxine  75 mcg Oral QAC breakfast  . mouth rinse  15 mL Mouth Rinse BID  . mometasone-formoterol  2 puff Inhalation BID  . montelukast  10 mg Oral QHS  . pantoprazole  40 mg Oral QAC breakfast  . potassium chloride  20 mEq Oral BID  . senna  2 tablet Oral BID  . sodium chloride flush  3 mL Intravenous Q12H  . ticagrelor  90 mg Oral BID  . tiotropium  18 mcg Inhalation Daily  . torsemide  20 mg Oral BID   Continuous Infusions:  PRN Meds: sodium chloride, acetaminophen, acetaminophen, diazepam, fluticasone, morphine injection, nitroGLYCERIN, nitroGLYCERIN, ondansetron (ZOFRAN) IV, ondansetron (ZOFRAN) IV, oxyCODONE, oxyCODONE-acetaminophen, sodium chloride flush   Vital Signs    Vitals:   05/02/16 1000 05/02/16 1124 05/02/16 1939 05/03/16 0457  BP: (!) 114/42 (!) 134/53 (!) 109/48 (!) 126/49    Pulse: (!) 57 (!) 54 (!) 55 (!) 59  Resp: (!) 21 16 18 18   Temp: 97.8 F (36.6 C) 98.2 F (36.8 C) 98 F (36.7 C) 98 F (36.7 C)  TempSrc: Oral Oral Oral Oral  SpO2: 98% 100% 95% 95%  Weight:      Height:        Intake/Output Summary (Last 24 hours) at 05/03/16 0807 Last data filed at 05/03/16 0457  Gross per 24 hour  Intake              120 ml  Output             1400 ml  Net            -1280 ml   Filed Weights   05/01/16 0316 05/01/16 0517  Weight: 149 lb (67.6 kg) 161 lb 2.5 oz (73.1 kg)    Physical Exam    GEN: Well nourished, well developed, in no acute distress.  HEENT: Grossly normal.  Neck: Supple, no JVD, carotid bruits, or masses. Cardiac: Bradycardic, no murmurs, rubs, or gallops. No clubbing, cyanosis, edema.  Radials/DP/PT 2+ and equal bilaterally.  Respiratory:  Faint bibasilar crackles. GI: Soft, nontender, nondistended, BS + x 4. MS: no deformity or atrophy. Skin: warm and dry, no rash. Neuro:  Strength and sensation are intact. Psych: AAOx3.  Normal affect.  Labs    CBC  Recent Labs  05/02/16 0417 05/03/16 0627  WBC 5.2 6.4  HGB 12.3 11.9*  HCT 35.4 34.1*  MCV 92.8 92.3  PLT 215 570   Basic Metabolic Panel  Recent Labs  05/02/16 0417 05/03/16 0627  NA 141 136  K 4.1 4.1  CL 105 100*  CO2 29 30  GLUCOSE 91 106*  BUN 10 13  CREATININE 0.78 0.78  CALCIUM 9.4 9.0   Liver Function Tests  Recent Labs  05/01/16 0258  AST 24  ALT 6*  ALKPHOS 89  BILITOT 0.5  PROT 7.4  ALBUMIN 4.7    Recent Labs  05/01/16 0258  LIPASE 16   Cardiac Enzymes  Recent Labs  05/01/16 0837 05/01/16 1533 05/01/16 2058  TROPONINI 1.53* 1.05* 0.91*   BNP Invalid input(s): POCBNP D-Dimer No results for input(s): DDIMER in the last 72 hours. Hemoglobin A1C  Recent Labs  05/02/16 0417  HGBA1C 5.3   Fasting Lipid Panel  Recent Labs  05/02/16 0417  CHOL 184  HDL 47  LDLCALC 80  TRIG 286*  CHOLHDL 3.9   Thyroid Function  Tests No results for input(s): TSH, T4TOTAL, T3FREE, THYROIDAB in the last 72 hours.  Invalid input(s): FREET3  Telemetry    Sinus bradycardia, 50s bpm, underlying artifact - Personally Reviewed  ECG    n/a - Personally Reviewed  Radiology    No results found.  Cardiac Studies   LHC 05/01/16: Conclusion     Dist LAD lesion, 99 %stenosed.  Ost 1st Diag to 1st Diag lesion, 50 %stenosed.  There is mild left ventricular systolic dysfunction.  LV end diastolic pressure is normal.  The left ventricular ejection fraction is 45-50% by visual estimate. Apical akinesis  There is trivial (1+) mitral regurgitation.  A STENT XIENCE ALPINE RX 1.77L39 drug eluting stent was successfully placed, and does not overlap previously placed stent.  Prox LAD to Mid LAD lesion, 95 %stenosed.  Post intervention, there is a 0% residual stenosis.  Conclusion Diagnostic cardiac catheter as part of his STEMI which identified a proximal 95% LAD lesion and a distal 99 with a very tortuous size LAD Successful PCI and stent of proximal LAD with DES 3.2515 mm xience Mildly depressed overall left ventricular function with apical akinesis ejection fraction of 45%    TTE 05/01/16: Study Conclusions  - Procedure narrative: Transthoracic echocardiography. Image quality was suboptimal. The study was technically difficult. - Left ventricle: Systolic function was normal. The estimated ejection fraction was in the range of 55% to 60%. Regional wall motion abnormalities cannot be excluded. - Mitral valve: Severely calcified annulus. - Left atrium: The atrium was mildly dilated.   Patient Profile     83 y.o. female with history of recently diagnosed CAD with possible anterior wall STEMI upon presentation s/p PCI/DES to the proximal LAD with residual 99% stenosis of tortuous distal LAD, PAF not on full-dose anticoagulation 2/2 prior severe GI bleed, pulmonary HTN, reported HOCM, DVT s/p IVC  filter, OSA intolerant to CPAP, HTN, HLD, and persistent nausea and vomiting since summer of 2017 who presented to [facility] with chest pain found to have anterior wall ST elevation MI.   Assessment & Plan    1. Anterior wall STEMI/CAD: -Currently chest pain free -Had some chest pain with ambulation on 3/19 with PT, lasted approximately 5 minutes and self resolved with sitting -Pain not a severe as presenting symptom -Add Imdur 30 mg daily, ambulate this PM, if  pain-free can likely go home. If still with pain may need to stay for further medication titration -Troponin peaked at 1.53 -Continue DAPT with ASA 81 mg daily and Brilinta 90 mg bid -Would ideally like to keep on Brilinta for at least first 30 days, then could consider transitioning to ASA with Plavix given her history of GI bleed -Case manager to assist with Brilinta card -Monitor for GI bleeding -Echo with preserved EF, unable to exclude RWMA, cath with apical akinesis  -Bradycardia precludes beta blocker at this time -LDL 80, continue Lipitor, defer to primary cardiologist on Lipitor dose given age and body habitus  -A1c 5.3% -Cardiac rehab  2. PAF: -Currently maintaining sinus rhythm with a bradycardic rate -Not on full dose long term anticoagulation 2/2 prior severe GI bleed -Not on rate controlling medication at this time 2/2 bradycardia -CHADAS2VASc at least 6 (CHF, HTN, age x 2, vascular disease, female) -Would ideally like to avoid triple therapy given her GI bleeding -Continue home amiodarone  -Monitor on telemetry  3. Pulmonary HTN: -Continue home PO torsemide 20 mg bid  4. HLD: -Lipid panel as above -Lovastatin changed to Lipitor  5. Hyperglycemia: -A1c as above  6. DVT: -S/p IVC filter  7. GI bleed: -Monitor on DAPT  8. Persistent nausea/vomiting: -Perhaps related to the above -Has appointment with [facility] 4/3  9. OSA: -Intolerant to CPAP -On nighttime oxygen at home at 2 L via nasal  cannula  10. Dispo: -Ambulate today, if pain free likely d/c home -Follow up with TG in 1 week  Signed, Christell Faith, PA-C [place] Pager: [phone] 05/03/2016, 8:07 AM

## 2016-05-03 NOTE — Clinical Social Work Note (Signed)
Clinical Social Work Assessment  Patient Details  Name: Hailey [name] MRN: [id number] Date of Birth: 05-Aug-1932  Date of referral:  05/03/16               Reason for consult:  Facility Placement                Permission sought to share information with:  Facility Sport and exercise psychologist, Family Supports Permission granted to share information::  Yes, Verbal Permission Granted  Name::     Hailey [name] [phone]  [phone] or Hailey [name], Hailey [name]   [phone] or Hailey [name]   [phone]   Agency::  SNF admissions  Relationship::     Contact Information:     Housing/Transportation Living arrangements for the past 2 months:  [facility]) Source of Information:  Patient, Adult Children Patient Interpreter Needed:  None Criminal Activity/Legal Involvement Pertinent to Current Situation/Hospitalization:  No - Comment as needed Significant Relationships:  Adult Children Lives with:  Other (Comment) (Alone but in an independent Living facility) Do you feel safe going back to the place where you live?  No Need for family participation in patient care:  Yes (Comment) (Patient requests CSW to speak with her [name].)  Care giving concerns:  Patient and her [name] feel she needs some short term rehab before she is able to return back home.   Social Worker assessment / plan:  Patient is an 83 year old female who lives alone at [facility] living facility.  Patient is alert and oriented x4 and able to express her feelings.  Patient expressed that her medications are becoming difficult to pay for and she was looking into applying for Medicaid, CSW provided her contact information for [place] DSS.  Patient states she has been to rehab in the past, and is familiar with what to expect at SNF.  Patient stated she would like CSW to speak with her [name] in regards to SNF placement options.  CSW contacted patient's [name] Hailey [name]  [phone] and explained how insurance will pay for stay and what the process is for finding SNF placement.  Patient's [name] was appreciative of information given.  Patient's [name] said patient has been at [facility], and would like her to SNF if possible.  CSW explained that if they do not have beds available then they will have to consider other facilities.  Patient's [name] expressed understanding and gave CSW permission to begin bed search process in [place].   Employment status:  Retired Nurse, adult PT Recommendations:  [place] / Referral to community resources:  [place]  Patient/Family's Response to care:  Patient and [name] in agreement to going to SNF for short term rehab.  Patient/Family's Understanding of and Emotional Response to Diagnosis, Current Treatment, and Prognosis: Patient expressed that she is frustrated that she has had many medical issues come up recently.  Patient expressed that she feels like she is on a lot of medications and they are becoming hard to pay for.  CSW listened to patient's concerns and provided psychosocial support.  Emotional Assessment Appearance:  Appears stated age Attitude/Demeanor/Rapport:    Affect (typically observed):  Appropriate, Calm Orientation:  Oriented to  Time, Oriented to Place, Oriented to Self, Oriented to Situation Alcohol / Substance use:  Not Applicable Psych involvement (Current and /or in the community):  No (Comment)  Discharge Needs  Concerns to be addressed:  Lack of Support Readmission within the last 30  days:  No Current discharge risk:  Lack of support system, Lives alone Barriers to Discharge:  Insurance Authorization   [name] 05/03/2016, 4:51 PM

## 2016-05-03 NOTE — NC FL2 (Signed)
[place] LEVEL OF CARE SCREENING TOOL     IDENTIFICATION  Patient Name: Hailey [name] Birthdate: January 28, 1933 Sex: female Admission Date (Current Location): 05/01/2016  [place] and [place] Number:  Engineering geologist and Address:  [facility], [address], [place], [place] [address]      Provider Number: [id number]  Attending Physician Name and Address:  Max Sane, MD  Relative Name and Phone Number:  Lysle Rubens Daughter [phone] or [place], [place]   [phone] or Jessica Priest   [phone]     Current Level of Care: Hospital Recommended Level of Care: [place] Prior Approval Number:    Date Approved/Denied:   PASRR Number: [id number]  Discharge Plan: SNF    Current Diagnoses: Patient Active Problem List   Diagnosis Date Noted  . Acute ST elevation myocardial infarction (STEMI) involving left anterior descending (LAD) coronary artery ([place]) 05/01/2016  . STEMI (ST elevation myocardial infarction) ([place]) 05/01/2016  . Protein-calorie malnutrition, severe 10/23/2015  . Uncontrollable vomiting   . Nausea and vomiting 10/22/2015  . Tremor 07/07/2015  . Respiratory failure ([place]) 05/19/2015  . Weakness 01/27/2015  . Abdomen enlarged 09/26/2013  . CTCL (cutaneous T-cell lymphoma) ([place]) 09/18/2013  . Anxiety 08/09/2013  . CCF (congestive cardiac failure) ([place]) 08/09/2013  . Chronic pain 08/09/2013  . Chronic diastolic CHF (congestive heart failure) ([place]) 07/12/2013  . Benign essential HTN 07/01/2013  . Bilateral leg edema 06/06/2013  . Paroxysmal atrial fibrillation ([place]) 04/25/2013  . DOE (dyspnea on exertion) 10/11/2011  . COPD (chronic obstructive pulmonary disease) ([place]) 10/09/2011  . Cognitive decline 10/09/2011  . Behavior concern 08/23/2011  . Chest pain 10/26/2010  . Palpitations 10/26/2010  . PHLEBITIS AND THROMBOPHLEBITIS OF FEMORAL VEIN 10/13/2008  . DEEP VENOUS THROMBOPHLEBITIS, LEG, LEFT  10/09/2008  . GASTRIC ULCER, ACUTE 10/09/2008  . PERSONAL HX COLONIC POLYPS 09/02/2008  . FATIGUE 08/27/2008  . HOCM (hypertrophic obstructive cardiomyopathy) ([place]) 11/06/2007  . HYPOTENSION, ORTHOSTATIC 11/06/2007  . ADENOMATOUS COLONIC POLYP 04/12/2007  . HIATAL HERNIA 04/12/2007  . Diverticulosis of colon (without mention of hemorrhage) 04/12/2007  . Hyperlipidemia 02/03/2007  . ANEMIA, CHRONIC 02/03/2007  . MIGRAINE HEADACHE 02/03/2007  . Essential hypertension 02/03/2007  . CEREBRAL ANEURYSM 02/03/2007  . GASTROESOPHAGEAL REFLUX DISEASE 02/03/2007  . OSTEOARTHRITIS 02/03/2007  . FIBROMYALGIA 02/03/2007  . CHRONIC FATIGUE SYNDROME 02/03/2007  . MITRAL VALVE PROLAPSE, HX OF 02/03/2007  . PULMONARY EMBOLISM, HX OF 02/03/2007  . TOTAL KNEE REPLACEMENT, LEFT, HX OF 02/03/2007  . HYSTERECTOMY, HX OF 02/03/2007  . Other acquired absence of organ 02/03/2007  . INGUINAL HERNIORRHAPHY, RIGHT, HX OF 02/03/2007    Orientation RESPIRATION BLADDER Height & Weight     Self, Time, Situation, Place  Normal Continent Weight: 161 lb 2.5 oz (73.1 kg) Height:  5' (152.4 cm)  BEHAVIORAL SYMPTOMS/MOOD NEUROLOGICAL BOWEL NUTRITION STATUS      Continent Diet (Regular diet)  AMBULATORY STATUS COMMUNICATION OF NEEDS Skin   Limited Assist Verbally Normal                       Personal Care Assistance Level of Assistance  Bathing, Feeding, Dressing Bathing Assistance: Limited assistance Feeding assistance: Limited assistance Dressing Assistance: Limited assistance     Functional Limitations Info  Sight, Speech, Hearing Sight Info: Adequate Hearing Info: Adequate Speech Info: Adequate    SPECIAL CARE FACTORS FREQUENCY  PT (By licensed PT), OT (By licensed OT)     PT Frequency: 5x a week  OT Frequency: 5x a week            Contractures Contractures Info: Present    Additional Factors Info  Code Status, Allergies, Psychotropic Code Status Info: DNR Allergies Info: ASPIRIN,  CODEINE, COMPAZINE PROCHLORPERAZINE, KETOROLAC TROMETHAMINE, NSAIDS, PHENAZOPYRIDINE HCL, MACRODANTIN NITROFURANTOIN MACROCRYSTAL            Current Medications (05/03/2016):  This is the current hospital active medication list Current Facility-Administered Medications  Medication Dose Route Frequency Provider Last Rate Last Dose  . 0.9 %  sodium chloride infusion  250 mL Intravenous PRN Dwayne D Callwood, MD      . acetaminophen (TYLENOL) tablet 650 mg  650 mg Oral Q4H PRN Yolonda Kida, MD   650 mg at 05/03/16 0844  . acetaminophen (TYLENOL) tablet 650 mg  650 mg Oral Q4H PRN Saundra Shelling, MD   650 mg at 05/02/16 0823  . aspirin EC tablet 81 mg  81 mg Oral Daily Saundra Shelling, MD   81 mg at 05/02/16 0824  . atorvastatin (LIPITOR) tablet 80 mg  80 mg Oral q1800 Dwayne D Callwood, MD   80 mg at 05/02/16 1731  . carbidopa-levodopa (SINEMET IR) 25-250 MG per tablet immediate release 1 tablet  1 tablet Oral TID [name], MD   1 tablet at 05/03/16 0848  . diazepam (VALIUM) tablet 2 mg  2 mg Oral Q4H PRN Yolonda Kida, MD   2 mg at 05/02/16 0108  . DULoxetine (CYMBALTA) DR capsule 30 mg  30 mg Oral Daily [name], MD   30 mg at 05/03/16 0856   And  . DULoxetine (CYMBALTA) DR capsule 60 mg  60 mg Oral Daily [name], MD   60 mg at 05/03/16 0855  . famotidine (PEPCID) tablet 20 mg  20 mg Oral Q48H Srikar Sudini, MD   20 mg at 05/02/16 0823  . feeding supplement (ENSURE ENLIVE) (ENSURE ENLIVE) liquid 237 mL  237 mL Oral BID BM Srikar Sudini, MD   237 mL at 05/03/16 0902  . fluticasone (FLONASE) 50 MCG/ACT nasal spray 2 spray  2 spray Each Nare Daily PRN Srikar Sudini, MD      . gabapentin (NEURONTIN) capsule 300 mg  300 mg Oral Daily Srikar Sudini, MD   300 mg at 05/03/16 0847   And  . gabapentin (NEURONTIN) capsule 200 mg  200 mg Oral Daily Srikar Sudini, MD   200 mg at 05/02/16 1732   And  . gabapentin (NEURONTIN) capsule 400 mg  400 mg Oral QHS Srikar Sudini, MD   400 mg  at 05/02/16 2126  . isosorbide mononitrate (IMDUR) 24 hr tablet 30 mg  30 mg Oral Daily Areta Haber Dunn, PA-C   30 mg at 05/03/16 0846  . levothyroxine (SYNTHROID, LEVOTHROID) tablet 75 mcg  75 mcg Oral QAC breakfast [name], MD   75 mcg at 05/03/16 0846  . MEDLINE mouth rinse  15 mL Mouth Rinse BID Yolonda Kida, MD   15 mL at 05/02/16 2126  . mometasone-formoterol (DULERA) 100-5 MCG/ACT inhaler 2 puff  2 puff Inhalation BID [name], MD   2 puff at 05/03/16 0902  . montelukast (SINGULAIR) tablet 10 mg  10 mg Oral QHS [name], MD   10 mg at 05/02/16 2126  . morphine 2 MG/ML injection 2 mg  2 mg Intravenous Q1H PRN Yolonda Kida, MD   2 mg at 05/01/16 2146  . nitroGLYCERIN (NITROSTAT) SL tablet 0.4 mg  0.4 mg  Sublingual Q5 min PRN Darel Hong, MD   0.4 mg at 05/01/16 0314  . nitroGLYCERIN (NITROSTAT) SL tablet 0.4 mg  0.4 mg Sublingual Q5 Min x 3 PRN Saundra Shelling, MD   0.4 mg at 05/01/16 1350  . ondansetron (ZOFRAN) injection 4 mg  4 mg Intravenous Q6H PRN Dwayne D Callwood, MD      . ondansetron (ZOFRAN) injection 4 mg  4 mg Intravenous Q6H PRN Saundra Shelling, MD   4 mg at 05/01/16 2227  . oxyCODONE (Oxy IR/ROXICODONE) immediate release tablet 5 mg  5 mg Oral Q6H PRN [name], MD   5 mg at 05/03/16 1159  . oxyCODONE-acetaminophen (PERCOCET/ROXICET) 5-325 MG per tablet 1-2 tablet  1-2 tablet Oral Q4H PRN Yolonda Kida, MD   1 tablet at 05/02/16 1140  . pantoprazole (PROTONIX) EC tablet 40 mg  40 mg Oral QAC breakfast [name], MD   40 mg at 05/03/16 0846  . potassium chloride SA (K-DUR,KLOR-CON) CR tablet 20 mEq  20 mEq Oral BID [name], MD   20 mEq at 05/03/16 0846  . senna (SENOKOT) tablet 17.2 mg  2 tablet Oral BID [name], MD   17.2 mg at 05/03/16 0848  . sodium chloride flush (NS) 0.9 % injection 3 mL  3 mL Intravenous Q12H Dwayne D Callwood, MD   3 mL at 05/03/16 0855  . sodium chloride flush (NS) 0.9 % injection 3 mL  3 mL Intravenous PRN  Dwayne D Callwood, MD      . ticagrelor (BRILINTA) tablet 90 mg  90 mg Oral BID Yolonda Kida, MD   90 mg at 05/03/16 0532  . tiotropium (SPIRIVA) inhalation capsule 18 mcg  18 mcg Inhalation Daily [name], MD   18 mcg at 05/03/16 0903  . torsemide (DEMADEX) tablet 20 mg  20 mg Oral BID Areta Haber Dunn, PA-C   20 mg at 05/03/16 0233     Discharge Medications: Please see discharge summary for a list of discharge medications.  Relevant Imaging Results:  Relevant Lab Results:   Additional Information SSN [id number]  Ross Ludwig, [place]

## 2016-05-03 NOTE — Progress Notes (Signed)
[place] [facility] [place] NAME: Hailey [name]    MR#:  [id number]  DATE OF BIRTH:  February 15, 1932  SUBJECTIVE:  CHIEF COMPLAINT:   Chief Complaint  Patient presents with  . Code STEMI  headache (took tylenolol 1 hr ago and is unrelieved, feels dizzi and afraid to leave today REVIEW OF SYSTEMS:    Review of Systems  Constitutional: Negative for chills and fever.  HENT: Negative for sore throat.   Eyes: Negative for blurred vision, double vision and pain.  Respiratory: Negative for cough, hemoptysis, shortness of breath and wheezing.   Cardiovascular: Negative for chest pain, palpitations, orthopnea and leg swelling.  Gastrointestinal: Negative for abdominal pain, constipation, diarrhea, heartburn, nausea and vomiting.  Genitourinary: Negative for dysuria and hematuria.  Musculoskeletal: Negative for back pain and joint pain.  Skin: Negative for rash.  Neurological: Positive for dizziness. Negative for sensory change, speech change, focal weakness and headaches.  Endo/Heme/Allergies: Does not bruise/bleed easily.  Psychiatric/Behavioral: Negative for depression. The patient is not nervous/anxious.    DRUG ALLERGIES:   Allergies  Allergen Reactions  . Aspirin Other (See Comments)    Reaction:  GI bleeding   . Codeine Nausea And Vomiting  . Compazine [Prochlorperazine] Other (See Comments)    Confusion  . Ketorolac Tromethamine Other (See Comments)    Reaction:  Headache   . Nsaids Other (See Comments)    Reaction:  GI bleeding   . Phenazopyridine Hcl Other (See Comments)    Reaction:  Vision problems   . Macrodantin [Nitrofurantoin Macrocrystal] Rash    VITALS:  Blood pressure 102/61, pulse 77, temperature 97.6 F (36.4 C), temperature source Oral, resp. rate 18, height 5' (1.524 m), weight 73.1 kg (161 lb 2.5 oz), SpO2 (!) 75 %. PHYSICAL EXAMINATION:  Physical Exam  GENERAL:  83 y.o.-year-old patient lying in the bed with no acute  distress.  EYES: Pupils equal, round, reactive to light and accommodation. No scleral icterus. Extraocular muscles intact.  HEENT: Head atraumatic, normocephalic. Oropharynx and nasopharynx clear.  NECK:  Supple, no jugular venous distention. No thyroid enlargement, no tenderness.  LUNGS: Normal breath sounds bilaterally, no wheezing, rales, rhonchi. No use of accessory muscles of respiration.  CARDIOVASCULAR: S1, S2 normal. No murmurs, rubs, or gallops.  ABDOMEN: Soft, nontender, nondistended. Bowel sounds present. No organomegaly or mass.  EXTREMITIES: No cyanosis, clubbing or edema b/l.    NEUROLOGIC: Cranial nerves II through XII are intact. No focal Motor or sensory deficits b/l.   PSYCHIATRIC: The patient is alert and oriented x 3.  SKIN: No obvious rash, lesion, or ulcer.  LABORATORY PANEL:   CBC  Recent Labs Lab 05/03/16 0627  WBC 6.4  HGB 11.9*  HCT 34.1*  PLT 208   ------------------------------------------------------------------------------------------------------------------ Chemistries   Recent Labs Lab 05/01/16 0258  05/03/16 0627  NA 137  < > 136  K 3.5  < > 4.1  CL 97*  < > 100*  CO2 33*  < > 30  GLUCOSE 134*  < > 106*  BUN 17  < > 13  CREATININE 1.04*  < > 0.78  CALCIUM 9.6  < > 9.0  AST 24  --   --   ALT 6*  --   --   ALKPHOS 89  --   --   BILITOT 0.5  --   --   < > = values in this interval not displayed. ------------------------------------------------------------------------------------------------------------------  Cardiac Enzymes  Recent Labs Lab 05/01/16 2058  TROPONINI 0.91*    ASSESSMENT AND PLAN:  83 year old elderly female patient with history of proximal atrial fibrillation, cardiomyopathy, COPD, congestive heart failure, GERD presented to the emergency room with chest pain. Code STEMI was called and patient underwent cardiac catheterization and stent was placed in proximal LAD.  1. Anterior ST elevation myocardial infarction:  normal LV Function - Status post PCI (DES) to proximal LAD - DAT (Brilanta & ASA) - high dose statin - no b-blocker or ACE-I due to bradycardia/hypotension - Has some exertional angina - Cardio started low-dose isosorbide mononitrate. No anticoagulation for PAF, given h/o bleeding and ongoing DAPT.  2. Cardiomyopathy - mgmt per cardio  3. Emphysema: chronic  4. Congestive heart failure: chronic diastolic EF 81-15%  5. Parkinson's disease     All the records are reviewed and case discussed with Care Management/Social Worker Management plans discussed with the patient, NURSING and they are in agreement.  CODE STATUS: FULL CODE  DVT Prophylaxis: SCDs  TOTAL TIME TAKING CARE OF THIS PATIENT: 20 minutes.   POSSIBLE D/C IN 1 DAYS, DEPENDING ON CLINICAL CONDITION.  Max Sane M.D on 05/03/2016 at 3:58 PM  Between 7am to 6pm - Pager - [phone]  After 6pm go to [email or web address] - password [email or web address] [facility]  Office  [phone]  CC: Primary care physician; BABAOFF, Caryl Bis, MD  Note: This dictation was prepared with Dragon dictation along with smaller phrase technology. Any transcriptional errors that result from this process are unintentional.

## 2016-05-03 NOTE — Clinical Social Work Placement (Addendum)
   CLINICAL SOCIAL WORK PLACEMENT  NOTE  Date:  05/03/2016  Patient Details  Name: Hailey [name] MRN: [id number] Date of Birth: 03-12-1932  Clinical Social Work is seeking post-discharge placement for this patient at the [place] level of care (*CSW will initial, date and re-position this form in  chart as items are completed):  Yes   Patient/family provided with [facility] Work Department's list of facilities offering this level of care within the geographic area requested by the patient (or if unable, by the patient's family).  Yes   Patient/family informed of their freedom to choose among providers that offer the needed level of care, that participate in Medicare, Medicaid or managed care program needed by the patient, have an available bed and are willing to accept the patient.  Yes   Patient/family informed of [facility]'s ownership interest in [facility] and [facility], as well as of the fact that they are under no obligation to receive care at these facilities.  PASRR submitted to EDS on 05/03/16     PASRR number received on       Existing PASRR number confirmed on 05/03/16     FL2 transmitted to all facilities in geographic area requested by pt/family on 05/03/16     FL2 transmitted to all facilities within larger geographic area on       Patient informed that his/her managed care company has contracts with or will negotiate with certain facilities, including the following:        Yes   Patient/family informed of bed offers received.  Patient chooses bed at [facility]     Physician recommends and patient chooses bed at      Patient to be transferred to [facility] on  .  Patient to be transferred to facility by  [facility] (Updated Evette Cristal, MSW, [place], 05-06-16)  Patient family notified on  05-06-16 of transfer. (Updated Evette Cristal, MSW, [place], 05-06-16)  Name of family member  notified:   Berdine Addison daughter (Updated Evette Cristal, MSW, [place], 05-06-16)     PHYSICIAN Please sign FL2, Please sign DNR     Additional Comment:    _______________________________________________ Ross Ludwig, [place] 05/03/2016, 5:14 PM   (Updated Evette Cristal, MSW, [place], 05-06-16)

## 2016-05-03 NOTE — Clinical Social Work Note (Signed)
CSW spoke to patient's daughter per patient's request to discuss bed offers for SNF.  Patient's daughter would like [facility].  CSW contacted [employer] who will begin insurance approval process.  CSW will continue to follow patient's progress throughout discharge planning.  Jones Broom. Struble, MSW, [place]  05/03/2016 3:25 PM

## 2016-05-04 DIAGNOSIS — I10 Essential (primary) hypertension: Secondary | ICD-10-CM

## 2016-05-04 DIAGNOSIS — E782 Mixed hyperlipidemia: Secondary | ICD-10-CM

## 2016-05-04 MED ORDER — MAGNESIUM HYDROXIDE 400 MG/5ML PO SUSP
15.0000 mL | Freq: Once | ORAL | Status: AC
  Administered 2016-05-04: 15 mL via ORAL
  Filled 2016-05-04: qty 30

## 2016-05-04 MED ORDER — CARBIDOPA-LEVODOPA 25-250 MG PO TABS
1.0000 | ORAL_TABLET | Freq: Four times a day (QID) | ORAL | Status: DC
  Administered 2016-05-04 – 2016-05-06 (×7): 1 via ORAL
  Filled 2016-05-04 (×8): qty 1

## 2016-05-04 MED ORDER — ISOSORBIDE MONONITRATE ER 30 MG PO TB24
30.0000 mg | ORAL_TABLET | Freq: Every day | ORAL | Status: DC
  Administered 2016-05-05 – 2016-05-06 (×2): 30 mg via ORAL
  Filled 2016-05-04 (×2): qty 1

## 2016-05-04 MED ORDER — ISOSORBIDE MONONITRATE ER 60 MG PO TB24
60.0000 mg | ORAL_TABLET | Freq: Every day | ORAL | Status: DC
  Administered 2016-05-04: 60 mg via ORAL
  Filled 2016-05-04: qty 1

## 2016-05-04 NOTE — Clinical Social Work Note (Addendum)
CSW received phone call from [facility] that they have received insurance approval for patient to go to SNF for short term rehab.  CSW updated physician, patient's daugter Berdine Addison, and case manager, patient can discharge to SNF once she is medically ready for discharge and orders have been received.  CSW to continue to follow patient's progress throughout discharge planning.  Jones Broom. Norval Morton, MSW, [place]  05/04/2016 4:54 PM

## 2016-05-04 NOTE — Progress Notes (Signed)
[place] [facility] [place] NAME: Hailey [name]    MR#:  [id number]  DATE OF BIRTH:  [birth year]  SUBJECTIVE:  CHIEF COMPLAINT:   Chief Complaint  Patient presents with  . Code STEMI  has lots of hesitation leaving, afraid of stroke, may be another MI, feels her daughter/son are after her. Some chest pain REVIEW OF SYSTEMS:    Review of Systems  Constitutional: Negative for chills and fever.  HENT: Negative for sore throat.   Eyes: Negative for blurred vision, double vision and pain.  Respiratory: Negative for cough, hemoptysis, shortness of breath and wheezing.   Cardiovascular: Negative for chest pain, palpitations, orthopnea and leg swelling.  Gastrointestinal: Negative for abdominal pain, constipation, diarrhea, heartburn, nausea and vomiting.  Genitourinary: Negative for dysuria and hematuria.  Musculoskeletal: Negative for back pain and joint pain.  Skin: Negative for rash.  Neurological: Positive for dizziness. Negative for sensory change, speech change, focal weakness and headaches.  Endo/Heme/Allergies: Does not bruise/bleed easily.  Psychiatric/Behavioral: Negative for depression. The patient is not nervous/anxious.    DRUG ALLERGIES:   Allergies  Allergen Reactions  . Aspirin Other (See Comments)    Reaction:  GI bleeding   . Codeine Nausea And Vomiting  . Compazine [Prochlorperazine] Other (See Comments)    Confusion  . Ketorolac Tromethamine Other (See Comments)    Reaction:  Headache   . Nsaids Other (See Comments)    Reaction:  GI bleeding   . Phenazopyridine Hcl Other (See Comments)    Reaction:  Vision problems   . Macrodantin [Nitrofurantoin Macrocrystal] Rash    VITALS:  Blood pressure (!) 95/47, pulse 69, temperature 97.7 F (36.5 C), temperature source Oral, resp. rate 16, height 5' (1.524 m), weight 73.1 kg (161 lb 2.5 oz), SpO2 98 %. PHYSICAL EXAMINATION:  Physical Exam  GENERAL:  83 y.o.-year-old patient lying  in the bed with no acute distress.  EYES: Pupils equal, round, reactive to light and accommodation. No scleral icterus. Extraocular muscles intact.  HEENT: Head atraumatic, normocephalic. Oropharynx and nasopharynx clear.  NECK:  Supple, no jugular venous distention. No thyroid enlargement, no tenderness.  LUNGS: Normal breath sounds bilaterally, no wheezing, rales, rhonchi. No use of accessory muscles of respiration.  CARDIOVASCULAR: S1, S2 normal. No murmurs, rubs, or gallops.  ABDOMEN: Soft, nontender, nondistended. Bowel sounds present. No organomegaly or mass.  EXTREMITIES: No cyanosis, clubbing or edema b/l.    NEUROLOGIC: Cranial nerves II through XII are intact. No focal Motor or sensory deficits b/l.   PSYCHIATRIC: The patient is alert and oriented x 3.  SKIN: No obvious rash, lesion, or ulcer.  LABORATORY PANEL:   CBC  Recent Labs Lab 05/03/16 0627  WBC 6.4  HGB 11.9*  HCT 34.1*  PLT 208   ------------------------------------------------------------------------------------------------------------------ Chemistries   Recent Labs Lab 05/01/16 0258  05/03/16 0627  NA 137  < > 136  K 3.5  < > 4.1  CL 97*  < > 100*  CO2 33*  < > 30  GLUCOSE 134*  < > 106*  BUN 17  < > 13  CREATININE 1.04*  < > 0.78  CALCIUM 9.6  < > 9.0  AST 24  --   --   ALT 6*  --   --   ALKPHOS 89  --   --   BILITOT 0.5  --   --   < > = values in this interval not displayed. ------------------------------------------------------------------------------------------------------------------  Cardiac Enzymes  Recent  Labs Lab 05/01/16 2058  TROPONINI 0.91*    ASSESSMENT AND PLAN:  83 year old elderly female patient with history of proximal atrial fibrillation, cardiomyopathy, COPD, congestive heart failure, GERD presented to the emergency room with chest pain. Code STEMI was called and patient underwent cardiac catheterization and stent was placed in proximal LAD.  1. Anterior ST elevation  myocardial infarction: normal LV Function - Status post PCI (DES) to proximal LAD - DAT (Brilanta & ASA) - high dose statin - no b-blocker or ACE-I due to bradycardia/hypotension - Has some exertional angina - Continue isosorbide mononitrate but if more headache can consider Ranexa  2. Cardiomyopathy - mgmt per cardio - continue torsemide  3. Emphysema: chronic  4. Congestive heart failure: chronic diastolic EF 86-85%  5. Parkinson's disease  6. Hypothyroidism - continue synthroid  Needs STR/SNF   All the records are reviewed and case discussed with Care Management/Social Worker Management plans discussed with the patient, NURSING and they are in agreement.  CODE STATUS: DNR  DVT Prophylaxis: SCDs  TOTAL TIME TAKING CARE OF THIS PATIENT: 20 minutes.   POSSIBLE D/C IN 1-2 DAYS, DEPENDING ON CLINICAL CONDITION.  Max Sane M.D on 05/04/2016 at 5:24 PM  Between 7am to 6pm - Pager - [phone]  After 6pm go to [email or web address] - password [email or web address] [facility]  Office  [phone]  CC: Primary care physician; BABAOFF, Caryl Bis, MD  Note: This dictation was prepared with Dragon dictation along with smaller phrase technology. Any transcriptional errors that result from this process are unintentional.

## 2016-05-04 NOTE — Care Management Important Message (Signed)
Important Message  Patient Details  Name: Hailey [name] MRN: [id number] Date of Birth: 10-07-1932   Medicare Important Message Given:  Yes  Initial signed IM printed from Epic and given to patient.   Katrina Stack, RN 05/04/2016, 5:14 PM

## 2016-05-04 NOTE — Progress Notes (Signed)
Physical Therapy Treatment Patient Details Name: Hailey [name] MRN: [id number] DOB: 14-Apr-1932 Today's Date: 05/04/2016    History of Present Illness Pt is a 83 y/o F who presented to the ED with code STEMI.  Pt reported sharp pain in the chest and L side of chest.  Patient went to cardiac Cath Lab and had cardiac catheterization and cardiac stent was placed in proximal LAD.  Pt has had h/o nausea and vomiting over the past 6 months.  Pt's PMH includes cerebral aneurysm, CHF, COPD, DVT in pregnancy, Fibromyalgia, L TKA, tremor.    PT Comments    Hailey [name] made progress with mobility today, only requiring min guard assist for safe sit<>stand transfers and close min guard assist for short distance ambulation in room. She does brace LEs against side of bed with sit<>stand transfers demonstrating balance impairments.  She reported 3-5/10 chest and Bil neck tightness with activity with this decreasing to 2/10 at end of session when pt resting comfortably in chair.  HR ranged from 59 at rest to 85 with activity.  RN notified. SNF remains most appropriate d/c plan at this time.    Follow Up Recommendations  SNF     Equipment Recommendations  None recommended by PT    Recommendations for Other Services OT consult     Precautions / Restrictions Precautions Precautions: Fall;Other (comment) Precaution Comments: monitor for chest pain, monitor HR Restrictions Weight Bearing Restrictions: No    Mobility  Bed Mobility Overal bed mobility: Needs Assistance Bed Mobility: Supine to Sit     Supine to sit: Modified independent (Device/Increase time)     General bed mobility comments: Increased time and effort but no physical assist or cues needed  Transfers Overall transfer level: Needs assistance Equipment used: Rolling walker (2 wheeled) Transfers: Sit to/from Stand Sit to Stand: Min guard         General transfer comment: Pt demonstrated safe technique using RW.  Slow to stand and  braces legs against side of bed but no physical assist needed.  Close min guard provided.  Ambulation/Gait Ambulation/Gait assistance: Min guard Ambulation Distance (Feet): 40 Feet Assistive device: Rolling walker (2 wheeled) Gait Pattern/deviations: Decreased stride length Gait velocity: decreased Gait velocity interpretation: Below normal speed for age/gender General Gait Details: Slow but steady gait, pt fatigues quickly and close min guard provided.     Stairs            Wheelchair Mobility    Modified Rankin (Stroke Patients Only)       Balance Overall balance assessment: Needs assistance Sitting-balance support: No upper extremity supported;Feet supported Sitting balance-Leahy Scale: Good     Standing balance support: Bilateral upper extremity supported;During functional activity Standing balance-Leahy Scale: Poor Standing balance comment: Relies on UE support to remain steady with static and dynamic activities                    Cognition Arousal/Alertness: Awake/alert Behavior During Therapy: WFL for tasks assessed/performed Overall Cognitive Status: Within Functional Limits for tasks assessed                      Exercises Other Exercises Other Exercises: SLS 1x30 sec each LE with intermittent 1UE support Other Exercises: 1x45 seconds Rhomberg stance without UE support and close min guard assist due to unsteadiness but no LOB Other Exercises: 1x30 seconds Rhomberg stance without UE support requiring up to minA to steady    General Comments General comments (skin  integrity, edema, etc.): Pt reports 5/10 chest and Bil neck tightness with sit>stand transfer and 3-5/10 with very light balance exercises.  Chest tightness down to 2/10 at end of session when resting comfortably in chair.  RN notified.  HR ranged from 59 at rest to 85 with activity.      Pertinent Vitals/Pain Pain Assessment: 0-10 Pain Score: 5  Pain Location: 5/10 chest and Bil  neck tightness with light activity Pain Descriptors / Indicators: Radiating;Tightness Pain Intervention(s): Limited activity within patient's tolerance;Monitored during session;Repositioned    Home Living                      Prior Function            PT Goals (current goals can now be found in the care plan section) Acute Rehab PT Goals Patient Stated Goal: to become more independent PT Goal Formulation: With patient Time For Goal Achievement: 05/16/16 Potential to Achieve Goals: Good Progress towards PT goals: Progressing toward goals    Frequency    Min 2X/week      PT Plan Current plan remains appropriate    Co-evaluation             End of Session Equipment Utilized During Treatment: Gait belt Activity Tolerance: Patient limited by pain (chest and Bil neck tightness) Patient left: in chair;with call bell/phone within reach;with chair alarm set Nurse Communication: Mobility status;Other (comment) (pt's report of 5/10 chest and Bil neck tightness) PT Visit Diagnosis: Unsteadiness on feet (R26.81);Muscle weakness (generalized) (M62.81)     Time: 0102-7253 PT Time Calculation (min) (ACUTE ONLY): 20 min  Charges:  $Therapeutic Exercise: 8-22 mins                    G Codes:       Collie Siad PT, DPT 05/04/2016, 9:25 AM

## 2016-05-04 NOTE — Progress Notes (Signed)
Patient Name: Hailey [name] Date of Encounter: 05/04/2016  Primary Cardiologist: [facility] Problem List     Active Problems:   Acute ST elevation myocardial infarction (STEMI) involving left anterior descending (LAD) coronary artery (HCC)   STEMI (ST elevation myocardial infarction) (HCC)     Subjective   Doing well. Still with dull chest pressure. Headache on 3/20. She does not associate this with Imdur. Has history of headache disorder. No headache currently. Wants to ambulate today. Remains bradycardic in the upper 50s bpm. Anxious about discharge.   Inpatient Medications    Scheduled Meds: . aspirin EC  81 mg Oral Daily  . atorvastatin  80 mg Oral q1800  . carbidopa-levodopa  1 tablet Oral TID  . DULoxetine  30 mg Oral Daily   And  . DULoxetine  60 mg Oral Daily  . famotidine  20 mg Oral Q48H  . feeding supplement (ENSURE ENLIVE)  237 mL Oral BID BM  . gabapentin  300 mg Oral Daily   And  . gabapentin  200 mg Oral Daily   And  . gabapentin  400 mg Oral QHS  . isosorbide mononitrate  30 mg Oral Daily  . levothyroxine  75 mcg Oral QAC breakfast  . mouth rinse  15 mL Mouth Rinse BID  . mometasone-formoterol  2 puff Inhalation BID  . montelukast  10 mg Oral QHS  . pantoprazole  40 mg Oral QAC breakfast  . potassium chloride  20 mEq Oral BID  . senna  2 tablet Oral BID  . sodium chloride flush  3 mL Intravenous Q12H  . ticagrelor  90 mg Oral BID  . tiotropium  18 mcg Inhalation Daily  . torsemide  20 mg Oral BID   Continuous Infusions:  PRN Meds: sodium chloride, acetaminophen, acetaminophen, diazepam, fluticasone, morphine injection, nitroGLYCERIN, nitroGLYCERIN, ondansetron (ZOFRAN) IV, ondansetron (ZOFRAN) IV, oxyCODONE, oxyCODONE-acetaminophen, sodium chloride flush   Vital Signs    Vitals:   05/03/16 1020 05/03/16 1231 05/03/16 2019 05/04/16 0510  BP: (!) 135/48 102/61 (!) 113/54 (!) 113/41  Pulse: (!) 57 77 61 63  Resp:  18 18   Temp:  97.6 F  (36.4 C) 97.8 F (36.6 C) 98.4 F (36.9 C)  TempSrc:  Oral Oral Oral  SpO2:  (!) 75% 90% 94%  Weight:      Height:        Intake/Output Summary (Last 24 hours) at 05/04/16 0751 Last data filed at 05/04/16 0522  Gross per 24 hour  Intake              360 ml  Output             1400 ml  Net            -1040 ml   Filed Weights   05/01/16 0316 05/01/16 0517  Weight: 149 lb (67.6 kg) 161 lb 2.5 oz (73.1 kg)    Physical Exam    GEN: Frail appearing, in no acute distress.  HEENT: Grossly normal.  Neck: Supple, no JVD, carotid bruits, or masses. Cardiac: Bradycardic, no murmurs, rubs, or gallops. No clubbing, cyanosis, edema.  Radials/DP/PT 2+ and equal bilaterally.  Respiratory:  Faint bibasilar crackles. GI: Soft, nontender, nondistended, BS + x 4. MS: no deformity or atrophy. Skin: warm and dry, no rash. Neuro:  Strength and sensation are intact. Psych: AAOx3.  Normal affect.  Labs    CBC  Recent Labs  05/02/16 0417 05/03/16 0627  WBC  5.2 6.4  HGB 12.3 11.9*  HCT 35.4 34.1*  MCV 92.8 92.3  PLT 215 725   Basic Metabolic Panel  Recent Labs  05/02/16 0417 05/03/16 0627  NA 141 136  K 4.1 4.1  CL 105 100*  CO2 29 30  GLUCOSE 91 106*  BUN 10 13  CREATININE 0.78 0.78  CALCIUM 9.4 9.0   Liver Function Tests No results for input(s): AST, ALT, ALKPHOS, BILITOT, PROT, ALBUMIN in the last 72 hours. No results for input(s): LIPASE, AMYLASE in the last 72 hours. Cardiac Enzymes  Recent Labs  05/01/16 0837 05/01/16 1533 05/01/16 2058  TROPONINI 1.53* 1.05* 0.91*   BNP Invalid input(s): POCBNP D-Dimer No results for input(s): DDIMER in the last 72 hours. Hemoglobin A1C  Recent Labs  05/02/16 0417  HGBA1C 5.3   Fasting Lipid Panel  Recent Labs  05/02/16 0417  CHOL 184  HDL 47  LDLCALC 80  TRIG 286*  CHOLHDL 3.9   Thyroid Function Tests No results for input(s): TSH, T4TOTAL, T3FREE, THYROIDAB in the last 72 hours.  Invalid input(s):  FREET3  Telemetry    Sinus bradycardia, 50s bpm - Personally Reviewed  ECG    n/a - Personally Reviewed  Radiology    No results found.  Cardiac Studies   LHC 05/01/16: Conclusion     Dist LAD lesion, 99 %stenosed.  Ost 1st Diag to 1st Diag lesion, 50 %stenosed.  There is mild left ventricular systolic dysfunction.  LV end diastolic pressure is normal.  The left ventricular ejection fraction is 45-50% by visual estimate. Apical akinesis  There is trivial (1+) mitral regurgitation.  A STENT XIENCE ALPINE RX 3.66Y40 drug eluting stent was successfully placed, and does not overlap previously placed stent.  Prox LAD to Mid LAD lesion, 95 %stenosed.  Post intervention, there is a 0% residual stenosis.  Conclusion Diagnostic cardiac catheter as part of his STEMI which identified a proximal 95% LAD lesion and a distal 99 with a very tortuous size LAD Successful PCI and stent of proximal LAD with DES 3.2515 mm xience Mildly depressed overall left ventricular function with apical akinesis ejection fraction of 45%    TTE 05/01/16: Study Conclusions  - Procedure narrative: Transthoracic echocardiography. Image quality was suboptimal. The study was technically difficult. - Left ventricle: Systolic function was normal. The estimated ejection fraction was in the range of 55% to 60%. Regional wall motion abnormalities cannot be excluded. - Mitral valve: Severely calcified annulus. - Left atrium: The atrium was mildly dilated.   Patient Profile     83 y.o. female with history of recently diagnosed CAD with possible anterior wall STEMI upon presentation s/p PCI/DES to the proximal LAD with residual 99% stenosis of tortuous distal LAD, PAF not on full-dose anticoagulation 2/2 prior severe GI bleed, pulmonary HTN, reported HOCM, DVT s/p IVC filter, OSA intolerant to CPAP, HTN, HLD, and persistent nausea and vomiting since summer of 2017 who presented to [facility] with  chest pain found to have anterior wall ST elevation MI.   Assessment & Plan    1. Anterior wall STEMI/CAD: -Currently chest pain free -Had some chest pain with ambulation on 3/19 with PT, lasted approximately 5 minutes and self resolved with sitting -Pain not a severe as presenting symptom -Continued chest pressure intermittent -Titrate Imdur to 60 mg daily -If headache returns with continuation of Imdur, would stop Imdur and place on Ranexa 500 gm bid and titrate to 1000 mg bid as an outpatient as needed  -Ambulate  this PM, if pain-free can likely go home. If still with pain may need to stay for further medication titration -Troponin peaked at 1.53 -Continue DAPT with ASA 81 mg daily and Brilinta 90 mg bid -Would ideally like to keep on Brilinta for at least first 30 days, then could consider transitioning to ASA with Plavix given her history of GI bleed -Case manager to assist with Brilinta card -Monitor for GI bleeding -Echo with preserved EF, unable to exclude RWMA, cath with apical akinesis  -Bradycardia precludes beta blocker at this time -LDL 80, continue Lipitor, defer to primary cardiologist on Lipitor dose given age and body habitus  -A1c 5.3% -Cardiac rehab  2. PAF: -Currently maintaining sinus rhythm with a bradycardic rate -Not on full dose long term anticoagulation 2/2 prior severe GI bleed -Not on rate controlling medication at this time 2/2 bradycardia -CHADAS2VASc at least 6 (CHF, HTN, age x 2, vascular disease, female) -Would ideally like to avoid triple therapy given her GI bleeding -Continue home amiodarone at discharge  -Monitor on telemetry  3. Pulmonary HTN: -Continue home PO torsemide 20 mg bid  4. HLD: -Lipid panel as above -Lovastatin changed to Lipitor  5. Hyperglycemia: -A1c as above  6. DVT: -S/p IVC filter  7. GI bleed: -Monitor on DAPT  8. Persistent nausea/vomiting: -Perhaps related to the above -Has appointment with [facility]  4/3  9. OSA: -Intolerant to CPAP -On nighttime oxygen at home at 2 L via nasal cannula  10. Dispo: -Ambulate today, if pain free likely d/c home -Follow up with TG in 1 week  Signed, Marcille Blanco Clarkson Pager: [phone] 05/04/2016, 7:51 AM   Attending Note Patient seen and examined, agree with detailed note above,  Patient presentation and plan discussed on rounds.   Feels weak, chronic issue Legs seem to give out. Nurses and patient report some moderate chest pain this morning on ambulation with her walker to the bathroom. Symptoms seem to resolve relatively quickly She is concerned about distal LAD disease  On clinical exam No JVD, lungs clear to auscultation bilaterally, heart sounds regular with no murmurs appreciated, abdomen soft nontender, no significant lower extremity edema  Lab work reviewed showing stable basic metabolic panel, CBC  ---STEMI  stent to the mid LAD Residual distal LAD disease We will be unable to titrate isosorbide given hypotension ( systolic pressure less than 100) will decrease the dose back to 30 mg every morning ( she received 60 mg this morning) On brilinta, blocker, statin We may need ranexa twice a day if she has continued chest pain symptoms Tried to provide reassurance today, long discussion with her  ----PAF -CHADAS2VASc at least 6 (CHF, HTN, age x 2, vascular disease, female) Not on anticoagulation given GI bleed history Continue amiodarone  Dispo Will likely need rehabilitation, legs are weak, walking with a walker  Greater than 50% was spent in counseling and coordination of care with patient Total encounter time 35 minutes or more   Signed: Esmond Plants  M.D., Ph.D. [facility]

## 2016-05-05 LAB — CBC
HEMATOCRIT: 31.3 % — AB (ref 35.0–47.0)
Hemoglobin: 10.8 g/dL — ABNORMAL LOW (ref 12.0–16.0)
MCH: 32 pg (ref 26.0–34.0)
MCHC: 34.7 g/dL (ref 32.0–36.0)
MCV: 92.2 fL (ref 80.0–100.0)
PLATELETS: 225 10*3/uL (ref 150–440)
RBC: 3.39 MIL/uL — ABNORMAL LOW (ref 3.80–5.20)
RDW: 15.3 % — AB (ref 11.5–14.5)
WBC: 5.6 10*3/uL (ref 3.6–11.0)

## 2016-05-05 LAB — BASIC METABOLIC PANEL
Anion gap: 8 (ref 5–15)
BUN: 19 mg/dL (ref 6–20)
CALCIUM: 9.2 mg/dL (ref 8.9–10.3)
CO2: 28 mmol/L (ref 22–32)
CREATININE: 1.18 mg/dL — AB (ref 0.44–1.00)
Chloride: 104 mmol/L (ref 101–111)
GFR calc Af Amer: 49 mL/min — ABNORMAL LOW (ref >60)
GFR calc non Af Amer: 42 mL/min — ABNORMAL LOW (ref >60)
GLUCOSE: 118 mg/dL — AB (ref 65–99)
Potassium: 4.2 mmol/L (ref 3.5–5.1)
Sodium: 140 mmol/L (ref 135–145)

## 2016-05-05 MED ORDER — NYSTATIN 100000 UNIT/ML MT SUSP
5.0000 mL | Freq: Four times a day (QID) | OROMUCOSAL | Status: DC
  Filled 2016-05-05: qty 5

## 2016-05-05 MED ORDER — SENNOSIDES-DOCUSATE SODIUM 8.6-50 MG PO TABS
1.0000 | ORAL_TABLET | Freq: Two times a day (BID) | ORAL | Status: DC
  Administered 2016-05-05 – 2016-05-06 (×3): 1 via ORAL
  Filled 2016-05-05 (×4): qty 1

## 2016-05-05 MED ORDER — BISACODYL 10 MG RE SUPP
10.0000 mg | Freq: Every day | RECTAL | Status: DC
  Administered 2016-05-05: 10 mg via RECTAL
  Filled 2016-05-05 (×2): qty 1

## 2016-05-05 MED ORDER — POLYETHYLENE GLYCOL 3350 17 G PO PACK
17.0000 g | PACK | Freq: Two times a day (BID) | ORAL | Status: DC
  Administered 2016-05-05 – 2016-05-06 (×3): 17 g via ORAL
  Filled 2016-05-05 (×3): qty 1

## 2016-05-05 MED ORDER — NYSTATIN 100000 UNIT/ML MT SUSP
5.0000 mL | Freq: Four times a day (QID) | OROMUCOSAL | Status: DC
  Administered 2016-05-05 – 2016-05-06 (×7): 500000 [IU] via ORAL
  Filled 2016-05-05 (×6): qty 5

## 2016-05-05 NOTE — Progress Notes (Signed)
Patient Name: Hailey [name] Date of Encounter: 05/05/2016  Primary Cardiologist: [facility] Problem List     Active Problems:   Acute ST elevation myocardial infarction (STEMI) involving left anterior descending (LAD) coronary artery (HCC)   STEMI (ST elevation myocardial infarction) (HCC)     Subjective   Currently, without any further chest pain. Did not ambulate in the hallway on 3/21. Headache resolved. Soft systolic blood pressure precluded further titration of Imdur to 60 mg, now back on 30 mg daily and tolerating. Renal function elevated today from 0.78-1.18, potassium 4.2. CBC stable with mildly low hemoglobin at 10.8.  Inpatient Medications    Scheduled Meds: . aspirin EC  81 mg Oral Daily  . atorvastatin  80 mg Oral q1800  . carbidopa-levodopa  1 tablet Oral QID  . DULoxetine  30 mg Oral Daily   And  . DULoxetine  60 mg Oral Daily  . feeding supplement (ENSURE ENLIVE)  237 mL Oral BID BM  . gabapentin  300 mg Oral Daily   And  . gabapentin  200 mg Oral Daily   And  . gabapentin  400 mg Oral QHS  . isosorbide mononitrate  30 mg Oral Daily  . levothyroxine  75 mcg Oral QAC breakfast  . mouth rinse  15 mL Mouth Rinse BID  . mometasone-formoterol  2 puff Inhalation BID  . montelukast  10 mg Oral QHS  . nystatin  5 mL Oral QID  . pantoprazole  40 mg Oral QAC breakfast  . potassium chloride  20 mEq Oral BID  . senna  2 tablet Oral BID  . sodium chloride flush  3 mL Intravenous Q12H  . ticagrelor  90 mg Oral BID  . tiotropium  18 mcg Inhalation Daily  . torsemide  20 mg Oral BID   Continuous Infusions:  PRN Meds: sodium chloride, acetaminophen, acetaminophen, diazepam, fluticasone, morphine injection, nitroGLYCERIN, nitroGLYCERIN, ondansetron (ZOFRAN) IV, ondansetron (ZOFRAN) IV, oxyCODONE, oxyCODONE-acetaminophen, sodium chloride flush   Vital Signs    Vitals:   05/04/16 0806 05/04/16 1203 05/04/16 1949 05/05/16 0534  BP: (!) 113/51 (!) 95/47 (!)  99/43 (!) 118/42  Pulse: (!) 55 69 63 (!) 59  Resp:  _0 Temp:  97.7 F (36.5 C) 98.1 F (36.7 C) 97.8 F (36.6 C)  TempSrc:  Oral Oral Oral  SpO2: 95% 98% 92% 99%  Weight:      Height:        Intake/Output Summary (Last 24 hours) at 05/05/16 0814 Last data filed at 05/05/16 0545  Gross per 24 hour  Intake              480 ml  Output             2250 ml  Net            -1770 ml   Filed Weights   05/01/16 0316 05/01/16 0517  Weight: 149 lb (67.6 kg) 161 lb 2.5 oz (73.1 kg)    Physical Exam    GEN: Frail appearing, in no acute distress.  HEENT: Grossly normal.  Neck: Supple, no JVD, carotid bruits, or masses. Cardiac: RRR, no murmurs, rubs, or gallops. No clubbing, cyanosis, edema.  Radials/DP/PT 2+ and equal bilaterally.  Respiratory:  Faint bibasilar crackles. GI: Soft, nontender, nondistended, BS + x 4. MS: no deformity or atrophy. Skin: warm and dry, no rash. Neuro:  Strength and sensation are intact. Psych: AAOx3.  Normal affect.  Labs  CBC  Recent Labs  05/03/16 0627 05/05/16 0358  WBC 6.4 5.6  HGB 11.9* 10.8*  HCT 34.1* 31.3*  MCV 92.3 92.2  PLT 208 315   Basic Metabolic Panel  Recent Labs  05/03/16 0627 05/05/16 0358  NA 136 140  K 4.1 4.2  CL 100* 104  CO2 30 28  GLUCOSE 106* 118*  BUN 13 19  CREATININE 0.78 1.18*  CALCIUM 9.0 9.2   Liver Function Tests No results for input(s): AST, ALT, ALKPHOS, BILITOT, PROT, ALBUMIN in the last 72 hours. No results for input(s): LIPASE, AMYLASE in the last 72 hours. Cardiac Enzymes No results for input(s): CKTOTAL, CKMB, CKMBINDEX, TROPONINI in the last 72 hours. BNP Invalid input(s): POCBNP D-Dimer No results for input(s): DDIMER in the last 72 hours. Hemoglobin A1C No results for input(s): HGBA1C in the last 72 hours. Fasting Lipid Panel No results for input(s): CHOL, HDL, LDLCALC, TRIG, CHOLHDL, LDLDIRECT in the last 72 hours. Thyroid Function Tests No results for input(s): TSH,  T4TOTAL, T3FREE, THYROIDAB in the last 72 hours.  Invalid input(s): FREET3  Telemetry    Sinus rhythm, 60s-70s - Personally Reviewed  ECG    n/a - Personally Reviewed  Radiology    No results found.  Cardiac Studies   LHC 05/01/16: Conclusion     Dist LAD lesion, 99 %stenosed.  Ost 1st Diag to 1st Diag lesion, 50 %stenosed.  There is mild left ventricular systolic dysfunction.  LV end diastolic pressure is normal.  The left ventricular ejection fraction is 45-50% by visual estimate. Apical akinesis  There is trivial (1+) mitral regurgitation.  A STENT XIENCE ALPINE RX 1.76H60 drug eluting stent was successfully placed, and does not overlap previously placed stent.  Prox LAD to Mid LAD lesion, 95 %stenosed.  Post intervention, there is a 0% residual stenosis.  Conclusion Diagnostic cardiac catheter as part of his STEMI which identified a proximal 95% LAD lesion and a distal 99 with a very tortuous size LAD Successful PCI and stent of proximal LAD with DES 3.2515 mm xience Mildly depressed overall left ventricular function with apical akinesis ejection fraction of 45%    TTE 05/01/16: Study Conclusions  - Procedure narrative: Transthoracic echocardiography. Image quality was suboptimal. The study was technically difficult. - Left ventricle: Systolic function was normal. The estimated ejection fraction was in the range of 55% to 60%. Regional wall motion abnormalities cannot be excluded. - Mitral valve: Severely calcified annulus. - Left atrium: The atrium was mildly dilated.   Patient Profile     83 y.o. female with history of recently diagnosed CAD with possible anterior wall STEMI upon presentation s/p PCI/DES to the proximal LAD with residual 99% stenosis of tortuous distal LAD, PAF not on full-dose anticoagulation 2/2 prior severe GI bleed, pulmonary HTN, reported HOCM, DVT s/p IVC filter, OSA intolerant to CPAP, HTN, HLD, and persistent  nausea and vomiting since summer of 2017 who presented to [facility] with chest pain found to have anterior wall ST elevation MI.   Assessment & Plan    1. Anterior wall STEMI/CAD: -Currently chest pain free -Had some chest pain with ambulation on 3/19 with PT, lasted approximately 5 minutes and self resolved with sitting -Pain not a severe as presenting symptom -Did not tolerate titration of Imdur to 60 mg daily on 3/21. Back on Imdur 30 mg daily and tolerating without issues -Headache resolved, less likely related to Imdur at this time -If has recurrence of chest pain would add ranolazine 500 mg  twice a day and titrate to 1000 mg twice a day as indicated -Ambulate this PM, if pain-free can likely go home -Troponin peaked at 1.53 -Continue DAPT with ASA 81 mg daily and Brilinta 90 mg bid -Would ideally like to keep on Brilinta for at least first 30 days, then could consider transitioning to ASA with Plavix given her history of GI bleed -Case manager to assist with Brilinta card -Monitor for GI bleeding -Echo with preserved EF, unable to exclude RWMA, cath with apical akinesis  -Bradycardia precludes beta blocker at this time -LDL 80, continue Lipitor, defer to primary cardiologist on Lipitor dose given age and body habitus  -A1c 5.3% -Cardiac rehab  2. PAF: -Currently maintaining sinus rhythm with a normal heart rate -Not on full dose long term anticoagulation 2/2 prior severe GI bleed -Not on rate controlling medication at this time 2/2 bradycardia -CHADAS2VASc at least 6 (CHF, HTN, age x 2, vascular disease, female) -Would ideally like to avoid triple therapy given her GI bleeding -Continue home amiodaroneat discharge  -Monitor on telemetry  3. Pulmonary HTN: -Hold torsemide at this time given AKI  4. AKI:  -Hold torsemide as above -Gentle hydration -If goes home today, will need follow up be met first of the following week  5. HLD: -Lipid panel as above -Lovastatin changed  to Lipitor  6. Hyperglycemia: -A1c as above  7. DVT: -S/p IVC filter  8. GI bleed/anemia: -Monitor on DAPT -Hemoglobin down to 10.8 today from 11.9 prior. No complaints of BRBPR or melena -Monitor closely  9. Persistent nausea/vomiting: -Perhaps related to the above -Has appointment with [place] 4/3  10. OSA: -Intolerant to CPAP -On nighttime oxygen at home at 2 L via nasal cannula  11. Dispo: -Ambulate today, if pain free likely d/c home -Follow up with TG in 1 week  Signed, Christell Faith, PA-C [place] Pager: [phone] 05/05/2016, 8:14 AM

## 2016-05-05 NOTE — Progress Notes (Signed)
PT Cancellation Note  Patient Details Name: Hailey [name] MRN: [id number] DOB: 1932-06-17   Cancelled Treatment:    Reason Eval/Treat Not Completed: Medical issues which prohibited therapy.  Upon PT arrival pt reports she was just awoken by her chest pain which she reports as 8/10 that radiates to Bil neck and intermittently throughout the day has radiated down her L arm and into her upper back.  RN notified immediately.  Heart monitor reading 78 bpm at rest.     Collie Siad PT, DPT 05/05/2016, 3:17 PM

## 2016-05-05 NOTE — Progress Notes (Addendum)
[place] [facility] [place] NAME: Hailey [name]    MR#:  [id number]  DATE OF BIRTH:  1932-05-19  SUBJECTIVE:  CHIEF COMPLAINT:   Chief Complaint  Patient presents with  . Code STEMI  Complaints of chest pressure, does not feel comfortable leaving today.  She wants to work with physical therapy.  Constipation. REVIEW OF SYSTEMS:    Review of Systems  Constitutional: Negative for chills and fever.  HENT: Negative for sore throat.   Eyes: Negative for blurred vision, double vision and pain.  Respiratory: Negative for cough, hemoptysis, shortness of breath and wheezing.   Cardiovascular: Negative for chest pain, palpitations, orthopnea and leg swelling.  Gastrointestinal: Positive for constipation. Negative for abdominal pain, diarrhea, heartburn, nausea and vomiting.  Genitourinary: Negative for dysuria and hematuria.  Musculoskeletal: Negative for back pain and joint pain.  Skin: Negative for rash.  Neurological: Positive for dizziness. Negative for sensory change, speech change, focal weakness and headaches.  Endo/Heme/Allergies: Does not bruise/bleed easily.  Psychiatric/Behavioral: Negative for depression. The patient is not nervous/anxious.    DRUG ALLERGIES:   Allergies  Allergen Reactions  . Aspirin Other (See Comments)    Reaction:  GI bleeding   . Codeine Nausea And Vomiting  . Compazine [Prochlorperazine] Other (See Comments)    Confusion  . Ketorolac Tromethamine Other (See Comments)    Reaction:  Headache   . Nsaids Other (See Comments)    Reaction:  GI bleeding   . Phenazopyridine Hcl Other (See Comments)    Reaction:  Vision problems   . Macrodantin [Nitrofurantoin Macrocrystal] Rash    VITALS:  Blood pressure (!) 125/38, pulse 70, temperature 97.4 F (36.3 C), temperature source Oral, resp. rate 16, height 5' (1.524 m), weight 73.1 kg (161 lb 2.5 oz), SpO2 97 %. PHYSICAL EXAMINATION:  Physical Exam  GENERAL:  84  y.o.-year-old patient lying in the bed with no acute distress.  EYES: Pupils equal, round, reactive to light and accommodation. No scleral icterus. Extraocular muscles intact.  HEENT: Head atraumatic, normocephalic. Oropharynx and nasopharynx clear.  NECK:  Supple, no jugular venous distention. No thyroid enlargement, no tenderness.  LUNGS: Normal breath sounds bilaterally, no wheezing, rales, rhonchi. No use of accessory muscles of respiration.  CARDIOVASCULAR: S1, S2 normal. No murmurs, rubs, or gallops.  ABDOMEN: Soft, nontender, nondistended. Bowel sounds present. No organomegaly or mass.  EXTREMITIES: No cyanosis, clubbing or edema b/l.    NEUROLOGIC: Cranial nerves II through XII are intact. No focal Motor or sensory deficits b/l.   PSYCHIATRIC: The patient is alert and oriented x 3.  SKIN: No obvious rash, lesion, or ulcer.  LABORATORY PANEL:   CBC  Recent Labs Lab 05/05/16 0358  WBC 5.6  HGB 10.8*  HCT 31.3*  PLT 225   ------------------------------------------------------------------------------------------------------------------ Chemistries   Recent Labs Lab 05/01/16 0258  05/05/16 0358  NA 137  < > 140  K 3.5  < > 4.2  CL 97*  < > 104  CO2 33*  < > 28  GLUCOSE 134*  < > 118*  BUN 17  < > 19  CREATININE 1.04*  < > 1.18*  CALCIUM 9.6  < > 9.2  AST 24  --   --   ALT 6*  --   --   ALKPHOS 89  --   --   BILITOT 0.5  --   --   < > = values in this interval not displayed. ------------------------------------------------------------------------------------------------------------------  Cardiac Enzymes  Recent Labs Lab 05/01/16 2058  TROPONINI 0.91*    ASSESSMENT AND PLAN:  83 year old elderly female patient with history of proximal atrial fibrillation, cardiomyopathy, COPD, congestive heart failure, GERD presented to the emergency room with chest pain. Code STEMI was called and patient underwent cardiac catheterization and stent was placed in proximal  LAD.  1. Anterior ST elevation myocardial infarction: normal LV Function - Status post PCI (DES) to proximal LAD - DAT (Brilanta & ASA) - high dose statin - no b-blocker or ACE-I due to bradycardia/hypotension - would like to work with physical therapy to make sure her symptoms does not get worse. - Continue isosorbide mononitrate 30 mg once daily.  2. Cardiomyopathy - mgmt per cardio - continue torsemide  3. Emphysema: chronic  4. Congestive heart failure: chronic diastolic EF 26-20%  5. Parkinson's disease  6. Hypothyroidism - continue synthroid  7.  Constipation - Add Senokot-S 2 tablets twice a day.  Add MiraLAX and Dulcolax at bedtime.  Needs STR/SNF   All the records are reviewed and case discussed with Care Management/Social Worker Management plans discussed with the patient, NURSING, discussed with her daughter and Berdine Addison at [phone] and they are in agreement.  CODE STATUS: DNR  DVT Prophylaxis: SCDs  TOTAL TIME TAKING CARE OF THIS PATIENT: 20 minutes.   POSSIBLE D/C IN 1 DAYS, DEPENDING ON CLINICAL CONDITION.    Max Sane M.D on 05/05/2016 at 3:12 PM  Between 7am to 6pm - Pager - [phone]  After 6pm go to [email or web address] - password [email or web address] [facility]  Office  [phone]  CC: Primary care physician; BABAOFF, Caryl Bis, MD  Note: This dictation was prepared with Dragon dictation along with smaller phrase technology. Any transcriptional errors that result from this process are unintentional.

## 2016-05-06 DIAGNOSIS — R6889 Other general symptoms and signs: Secondary | ICD-10-CM | POA: Diagnosis not present

## 2016-05-06 DIAGNOSIS — I2102 ST elevation (STEMI) myocardial infarction involving left anterior descending coronary artery: Secondary | ICD-10-CM | POA: Diagnosis not present

## 2016-05-06 DIAGNOSIS — Z79899 Other long term (current) drug therapy: Secondary | ICD-10-CM | POA: Diagnosis not present

## 2016-05-06 DIAGNOSIS — Z96659 Presence of unspecified artificial knee joint: Secondary | ICD-10-CM | POA: Diagnosis not present

## 2016-05-06 DIAGNOSIS — I110 Hypertensive heart disease with heart failure: Secondary | ICD-10-CM | POA: Diagnosis not present

## 2016-05-06 DIAGNOSIS — J449 Chronic obstructive pulmonary disease, unspecified: Secondary | ICD-10-CM | POA: Diagnosis not present

## 2016-05-06 DIAGNOSIS — Z7982 Long term (current) use of aspirin: Secondary | ICD-10-CM | POA: Diagnosis not present

## 2016-05-06 DIAGNOSIS — I200 Unstable angina: Secondary | ICD-10-CM | POA: Diagnosis not present

## 2016-05-06 DIAGNOSIS — Z743 Need for continuous supervision: Secondary | ICD-10-CM | POA: Diagnosis not present

## 2016-05-06 DIAGNOSIS — I509 Heart failure, unspecified: Secondary | ICD-10-CM | POA: Diagnosis not present

## 2016-05-06 DIAGNOSIS — J45909 Unspecified asthma, uncomplicated: Secondary | ICD-10-CM | POA: Diagnosis not present

## 2016-05-06 DIAGNOSIS — I10 Essential (primary) hypertension: Secondary | ICD-10-CM | POA: Diagnosis not present

## 2016-05-06 DIAGNOSIS — J439 Emphysema, unspecified: Secondary | ICD-10-CM | POA: Diagnosis not present

## 2016-05-06 DIAGNOSIS — R072 Precordial pain: Secondary | ICD-10-CM | POA: Diagnosis present

## 2016-05-06 DIAGNOSIS — I25118 Atherosclerotic heart disease of native coronary artery with other forms of angina pectoris: Secondary | ICD-10-CM | POA: Diagnosis not present

## 2016-05-06 DIAGNOSIS — M5136 Other intervertebral disc degeneration, lumbar region: Secondary | ICD-10-CM | POA: Diagnosis not present

## 2016-05-06 DIAGNOSIS — Z95818 Presence of other cardiac implants and grafts: Secondary | ICD-10-CM | POA: Diagnosis not present

## 2016-05-06 DIAGNOSIS — I429 Cardiomyopathy, unspecified: Secondary | ICD-10-CM | POA: Diagnosis not present

## 2016-05-06 DIAGNOSIS — I5032 Chronic diastolic (congestive) heart failure: Secondary | ICD-10-CM | POA: Diagnosis not present

## 2016-05-06 DIAGNOSIS — R079 Chest pain, unspecified: Secondary | ICD-10-CM | POA: Diagnosis not present

## 2016-05-06 DIAGNOSIS — Z87891 Personal history of nicotine dependence: Secondary | ICD-10-CM | POA: Diagnosis not present

## 2016-05-06 DIAGNOSIS — I213 ST elevation (STEMI) myocardial infarction of unspecified site: Secondary | ICD-10-CM | POA: Diagnosis not present

## 2016-05-06 DIAGNOSIS — I480 Paroxysmal atrial fibrillation: Secondary | ICD-10-CM | POA: Diagnosis not present

## 2016-05-06 DIAGNOSIS — R0689 Other abnormalities of breathing: Secondary | ICD-10-CM | POA: Diagnosis not present

## 2016-05-06 DIAGNOSIS — Z7902 Long term (current) use of antithrombotics/antiplatelets: Secondary | ICD-10-CM | POA: Diagnosis not present

## 2016-05-06 LAB — BASIC METABOLIC PANEL
Anion gap: 8 (ref 5–15)
BUN: 17 mg/dL (ref 6–20)
CHLORIDE: 103 mmol/L (ref 101–111)
CO2: 27 mmol/L (ref 22–32)
CREATININE: 0.86 mg/dL (ref 0.44–1.00)
Calcium: 9.2 mg/dL (ref 8.9–10.3)
GFR calc Af Amer: >60 mL/min (ref >60)
GFR calc non Af Amer: >60 mL/min (ref >60)
Glucose, Bld: 117 mg/dL — ABNORMAL HIGH (ref 65–99)
Potassium: 4 mmol/L (ref 3.5–5.1)
SODIUM: 138 mmol/L (ref 135–145)

## 2016-05-06 LAB — CBC
HCT: 32.6 % — ABNORMAL LOW (ref 35.0–47.0)
HEMOGLOBIN: 11.2 g/dL — AB (ref 12.0–16.0)
MCH: 31.7 pg (ref 26.0–34.0)
MCHC: 34.2 g/dL (ref 32.0–36.0)
MCV: 92.6 fL (ref 80.0–100.0)
Platelets: 228 10*3/uL (ref 150–440)
RBC: 3.52 MIL/uL — ABNORMAL LOW (ref 3.80–5.20)
RDW: 15.1 % — ABNORMAL HIGH (ref 11.5–14.5)
WBC: 8.6 10*3/uL (ref 3.6–11.0)

## 2016-05-06 MED ORDER — OXYCODONE HCL 5 MG PO TABS: 5.0000 mg | ORAL_TABLET | Freq: Four times a day (QID) | ORAL | 0 refills | Status: DC | PRN

## 2016-05-06 MED ORDER — CARBIDOPA-LEVODOPA 25-250 MG PO TABS: 1.0000 | ORAL_TABLET | Freq: Four times a day (QID) | ORAL | 0 refills | Status: DC

## 2016-05-06 MED ORDER — DULOXETINE HCL 30 MG PO CPEP: 90.0000 mg | ORAL_CAPSULE | Freq: Every day | ORAL | 0 refills | Status: DC

## 2016-05-06 MED ORDER — ISOSORBIDE MONONITRATE ER 30 MG PO TB24: 30.0000 mg | ORAL_TABLET | Freq: Every day | ORAL | 0 refills | Status: DC

## 2016-05-06 MED ORDER — ATORVASTATIN CALCIUM 80 MG PO TABS: 80.0000 mg | ORAL_TABLET | Freq: Every day | ORAL | 0 refills | Status: DC

## 2016-05-06 MED ORDER — ASPIRIN 81 MG PO TBEC: 81.0000 mg | DELAYED_RELEASE_TABLET | Freq: Every day | ORAL | 0 refills | Status: DC

## 2016-05-06 MED ORDER — DULOXETINE HCL 30 MG PO CPEP: 90.0000 mg | ORAL_CAPSULE | Freq: Every day | ORAL | Status: DC

## 2016-05-06 MED ORDER — DIAZEPAM 2 MG PO TABS: 2.0000 mg | ORAL_TABLET | Freq: Four times a day (QID) | ORAL | 0 refills | Status: DC | PRN

## 2016-05-06 MED ORDER — TICAGRELOR 90 MG PO TABS: 90.0000 mg | ORAL_TABLET | Freq: Two times a day (BID) | ORAL | 0 refills | Status: DC

## 2016-05-06 NOTE — Progress Notes (Signed)
Patient being discharged to [facility] via EMS.  Daughter and son will see her there tonight.  A copy of the hospital summary is being given to the patient and a copy to [employer].

## 2016-05-06 NOTE — Discharge Summary (Signed)
[facility] NAME: Hailey [name]    MR#:  [id number]  DATE OF BIRTH:  [birth year]  DATE OF ADMISSION:  05/01/2016   ADMITTING PHYSICIAN: Yolonda Kida, MD  DATE OF DISCHARGE: 05/06/2016  PRIMARY CARE PHYSICIAN: BABAOFF, MARC E, MD   ADMISSION DIAGNOSIS:  ST elevation myocardial infarction (STEMI), unspecified artery (HCC) [I21.3] DISCHARGE DIAGNOSIS:  Active Problems:   Acute ST elevation myocardial infarction (STEMI) involving left anterior descending (LAD) coronary artery (HCC)   STEMI (ST elevation myocardial infarction) ([place])  SECONDARY DIAGNOSIS:   Past Medical History:  Diagnosis Date  . Anemia    Chronic  . Anxiety   . Arthritis   . Asthma   . Atrophic vaginitis   . Cerebral aneurysm    Hx of  . CHF (congestive heart failure) ([place])   . COPD (chronic obstructive pulmonary disease) ([place])   . Diverticulitis   . DVT (deep vein thrombosis) in pregnancy ([place]) 09/2008   a. LLE, recurrent hx, has IVC filter. Not on coumadin now with history of GI bleeding  . Fibrocystic disease of breast   . Fibromyalgia   . GERD (gastroesophageal reflux disease)   . Gout   . Gross hematuria   . Heart murmur   . History of colonoscopy   . Hyperlipidemia   . Hypertension   . Hypertrophic cardiomyopathy ([place])    a. Echo 4/09 with EF 70-75%, asymmetricy basal septal hypertrophy, mild MR without systolic anterior motion of the mitral valve, LVOT gradient to 130 mmHg with Valsalva b. Echo 7/13: EF 60-65%, mild focal basal septal hypertrophy, no significant LVOT gradient, no MV SAM c. Echo 2/15: EF 55-60%, HOCM, resting LVOT gradient 29 mmHg, Valsalva LVOT gradient > 140 mmHg, mild LVH, mild TR, elevated PASP  . Incomplete bladder emptying   . Migraine headache    Hx of  . Obesity   . OSA (obstructive sleep apnea)    a. Intolerant to CPAP, wears 2L via n/c  . Osteoarthritis    Hx of left TKR  . PAF (paroxysmal atrial fibrillation)  ([place])    a. Full-dose ASA alone 2/2 h/o GIB.  Marland Kitchen PAT (paroxysmal atrial tachycardia) ([place])   . 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 Pneumonia   . PUD (peptic ulcer disease)    With GI bleeding  . Pulmonary embolism ([place])   . Tremor    HOSPITAL COURSE:  83 year old elderly female patient with history of proximal atrial fibrillation, cardiomyopathy, COPD, congestive heart failure, GERD presented to the emergency room with chest pain.Code STEMI was called and patient underwent cardiac catheterization and stent was placed in proximal LAD.  1.Anterior ST elevation myocardial infarction: normal LV Function - Status post PCI (DES) to proximal LAD - DAT (Brilanta & ASA) - high dose statin - no b-blocker or ACE-I due to bradycardia/hypotension - Continue isosorbide mononitrate 30 mg once daily.  2.Cardiomyopathy - continue torsemide  3.Emphysema: chronic  4.Congestive heart failure: chronic diastolic EF 85-63%  5. Parkinson's disease  6. Hypothyroidism - continue synthroid  7.  Constipation - Resolved  DISCHARGE CONDITIONS:  stable CONSULTS OBTAINED:  Treatment Team:  Minna Merritts, MD Wende Bushy, MD Laverle Hobby, MD DRUG ALLERGIES:   Allergies  Allergen Reactions  . Aspirin Other (See Comments)    Reaction:  GI bleeding   . Codeine Nausea And Vomiting  . Compazine [Prochlorperazine] Other (See Comments)    Confusion  .  Ketorolac Tromethamine Other (See Comments)    Reaction:  Headache   . Nsaids Other (See Comments)    Reaction:  GI bleeding   . Phenazopyridine Hcl Other (See Comments)    Reaction:  Vision problems   . Macrodantin [Nitrofurantoin Macrocrystal] Rash   DISCHARGE MEDICATIONS:   Allergies as of 05/06/2016      Reactions   Aspirin Other (See Comments)   Reaction:  GI bleeding    Codeine Nausea And Vomiting   Compazine [prochlorperazine] Other (See Comments)   Confusion   Ketorolac  Tromethamine Other (See Comments)   Reaction:  Headache    Nsaids Other (See Comments)   Reaction:  GI bleeding    Phenazopyridine Hcl Other (See Comments)   Reaction:  Vision problems    Macrodantin [nitrofurantoin Macrocrystal] Rash      Medication List    STOP taking these medications   Carbidopa-Levodopa ER 25-100 MG tablet controlled release Commonly known as:  SINEMET CR Replaced by:  carbidopa-levodopa 25-250 MG tablet   lovastatin 40 MG tablet Commonly known as:  MEVACOR   nitrofurantoin (macrocrystal-monohydrate) 100 MG capsule Commonly known as:  MACROBID     TAKE these medications   acetaminophen 325 MG tablet Commonly known as:  TYLENOL Take 2 tablets (650 mg total) by mouth every 6 (six) hours as needed for mild pain or moderate pain (temp > 101.5).   albuterol 108 (90 Base) MCG/ACT inhaler Commonly known as:  PROVENTIL HFA;VENTOLIN HFA Inhale 1 puff into the lungs every 6 (six) hours as needed for wheezing or shortness of breath.   albuterol (2.5 MG/3ML) 0.083% nebulizer solution Commonly known as:  PROVENTIL Take 3 mLs (2.5 mg total) by nebulization every 4 (four) hours as needed for wheezing or shortness of breath.   amiodarone 200 MG tablet Commonly known as:  PACERONE Take 1 tablet (200 mg total) by mouth daily. Pt is able to take an additional tablet if needed for breakthrough arrhythmias.   aspirin 81 MG EC tablet Take 1 tablet (81 mg total) by mouth daily.   atorvastatin 80 MG tablet Commonly known as:  LIPITOR Take 1 tablet (80 mg total) by mouth daily at 6 PM.   carbidopa-levodopa 25-250 MG tablet Commonly known as:  SINEMET IR Take 1 tablet by mouth 4 (four) times daily. Replaces:  Carbidopa-Levodopa ER 25-100 MG tablet controlled release   chlorhexidine 0.12 % solution Commonly known as:  PERIDEX 15 mLs by Mouth Rinse route 2 (two) times daily.   diazepam 2 MG tablet Commonly known as:  VALIUM Take 1 tablet (2 mg total) by mouth every  6 (six) hours as needed for anxiety (or back pain).   dimenhyDRINATE 50 MG tablet Commonly known as:  DRAMAMINE Take 25-50 mg by mouth every 6 (six) hours as needed for dizziness.   docusate sodium 100 MG capsule Commonly known as:  COLACE Take 100 mg by mouth 2 (two) times daily as needed for mild constipation.   DULoxetine 30 MG capsule Commonly known as:  CYMBALTA Take 3 capsules (90 mg total) by mouth daily. Start taking on:  05/07/2016 What changed:  medication strength  how much to take  additional instructions  Another medication with the same name was removed. Continue taking this medication, and follow the directions you see here.   feeding supplement (ENSURE ENLIVE) Liqd Take 237 mLs by mouth 2 (two) times daily between meals.   fluticasone 50 MCG/ACT nasal spray Commonly known as:  FLONASE Place 2 sprays  into both nostrils daily as needed for rhinitis.   gabapentin 100 MG capsule Commonly known as:  NEURONTIN Take 200-400 mg by mouth 3 (three) times daily. Pt takes three capsules in the morning, two capsules in the evening, and four capsules at bedtime.   isosorbide mononitrate 30 MG 24 hr tablet Commonly known as:  IMDUR Take 1 tablet (30 mg total) by mouth daily.   Levothyroxine Sodium 75 MCG Caps Take 50 mcg by mouth daily before breakfast.   losartan 25 MG tablet Commonly known as:  COZAAR Take 25 mg by mouth daily.   mometasone-formoterol 100-5 MCG/ACT Aero Commonly known as:  DULERA Inhale 2 puffs into the lungs 2 (two) times daily.   montelukast 10 MG tablet Commonly known as:  SINGULAIR Take 10 mg by mouth at bedtime.   ondansetron 4 MG disintegrating tablet Commonly known as:  ZOFRAN-ODT Take 4 mg by mouth every 8 (eight) hours as needed for nausea or vomiting.   oxyCODONE 5 MG immediate release tablet Commonly known as:  Oxy IR/ROXICODONE Take 1 tablet (5 mg total) by mouth every 6 (six) hours as needed for moderate pain or severe  pain.   pantoprazole 40 MG tablet Commonly known as:  PROTONIX Take 40 mg by mouth daily.   polyethylene glycol packet Commonly known as:  MIRALAX / GLYCOLAX Take 17 g by mouth daily as needed for moderate constipation.   potassium chloride 10 MEQ tablet Commonly known as:  K-DUR,KLOR-CON Take 2 tablets (20 mEq total) by mouth 2 (two) times daily.   senna 8.6 MG Tabs tablet Commonly known as:  SENOKOT Take 2 tablets (17.2 mg total) by mouth 2 (two) times daily.   sucralfate 1 g tablet Commonly known as:  CARAFATE Take 1 g by mouth 2 (two) times daily.   ticagrelor 90 MG Tabs tablet Commonly known as:  BRILINTA Take 1 tablet (90 mg total) by mouth 2 (two) times daily.   tiotropium 18 MCG inhalation capsule Commonly known as:  SPIRIVA Place 18 mcg into inhaler and inhale daily.   torsemide 20 MG tablet Commonly known as:  DEMADEX Take 1 tablet (20 mg total) by mouth 2 (two) times daily.   triamcinolone ointment 0.1 % Commonly known as:  KENALOG Apply 1 application topically 2 (two) times daily as needed (for rash).        DISCHARGE INSTRUCTIONS:   DIET:  Cardiac diet DISCHARGE CONDITION:  Stable ACTIVITY:  Activity as tolerated OXYGEN:  Home Oxygen: No.  Oxygen Delivery: room air DISCHARGE LOCATION:  nursing home   If you experience worsening of your admission symptoms, develop shortness of breath, life threatening emergency, suicidal or homicidal thoughts you must seek medical attention immediately by calling 911 or calling your MD immediately  if symptoms less severe.  You Must read complete instructions/literature along with all the possible adverse reactions/side effects for all the Medicines you take and that have been prescribed to you. Take any new Medicines after you have completely understood and accpet all the possible adverse reactions/side effects.   Please note  You were cared for by a hospitalist during your hospital stay. If you have any  questions about your discharge medications or the care you received while you were in the hospital after you are discharged, you can call the unit and asked to speak with the hospitalist on call if the hospitalist that took care of you is not available. Once you are discharged, your primary care physician will handle any further  medical issues. Please note that NO REFILLS for any discharge medications will be authorized once you are discharged, as it is imperative that you return to your primary care physician (or establish a relationship with a primary care physician if you do not have one) for your aftercare needs so that they can reassess your need for medications and monitor your lab values.    On the day of Discharge:  VITAL SIGNS:  Blood pressure (!) 121/43, pulse 60, temperature 97.8 F (36.6 C), temperature source Oral, resp. rate 16, height 5' (1.524 m), weight 73.1 kg (161 lb 2.5 oz), SpO2 100 %. PHYSICAL EXAMINATION:  GENERAL:  83 y.o.-year-old patient lying in the bed with no acute distress.  EYES: Pupils equal, round, reactive to light and accommodation. No scleral icterus. Extraocular muscles intact.  HEENT: Head atraumatic, normocephalic. Oropharynx and nasopharynx clear.  NECK:  Supple, no jugular venous distention. No thyroid enlargement, no tenderness.  LUNGS: Normal breath sounds bilaterally, no wheezing, rales,rhonchi or crepitation. No use of accessory muscles of respiration.  CARDIOVASCULAR: S1, S2 normal. No murmurs, rubs, or gallops.  ABDOMEN: Soft, non-tender, non-distended. Bowel sounds present. No organomegaly or mass.  EXTREMITIES: No pedal edema, cyanosis, or clubbing.  NEUROLOGIC: Cranial nerves II through XII are intact. Muscle strength 5/5 in all extremities. Sensation intact. Gait not checked.  PSYCHIATRIC: The patient is alert and oriented x 3.  SKIN: No obvious rash, lesion, or ulcer.  DATA REVIEW:   CBC  Recent Labs Lab 05/06/16 0522  WBC 8.6  HGB 11.2*   HCT 32.6*  PLT 228    Chemistries   Recent Labs Lab 05/01/16 0258  05/06/16 0522  NA 137  < > 138  K 3.5  < > 4.0  CL 97*  < > 103  CO2 33*  < > 27  GLUCOSE 134*  < > 117*  BUN 17  < > 17  CREATININE 1.04*  < > 0.86  CALCIUM 9.6  < > 9.2  AST 24  --   --   ALT 6*  --   --   ALKPHOS 89  --   --   BILITOT 0.5  --   --   < > = values in this interval not displayed.   Microbiology Results  Results for orders placed or performed during the hospital encounter of 05/01/16  MRSA PCR Screening     Status: None   Collection Time: 05/01/16  5:22 AM  Result Value Ref Range Status   MRSA by PCR NEGATIVE NEGATIVE Final    Comment:        The GeneXpert MRSA Assay (FDA approved for NASAL specimens only), is one component of a comprehensive MRSA colonization surveillance program. It is not intended to diagnose MRSA infection nor to guide or monitor treatment for MRSA infections.     RADIOLOGY:  No results found.   Management plans discussed with the patient, family and they are in agreement.  CODE STATUS: DNR   TOTAL TIME TAKING CARE OF THIS PATIENT: 45 minutes.    Max Sane M.D on 05/06/2016 at 2:29 PM  Between 7am to 6pm - Pager - [phone]  After 6pm go to [email or web address] - Proofreader  [facility]  Office  [phone]  CC: Primary care physician; BABAOFF, Caryl Bis, MD   Note: This dictation was prepared with Dragon dictation along with smaller phrase technology. Any transcriptional errors that result from this process are unintentional.

## 2016-05-06 NOTE — Plan of Care (Signed)
Problem: Health Behavior/Discharge Planning: Goal: Ability to manage health-related needs will improve Outcome: Completed/Met Date Met: 05/06/16 Discharge information regarding med change, appointments and follow up care.

## 2016-05-06 NOTE — Clinical Social Work Note (Signed)
Patient to be d/c'ed today to [facility].  Patient and family agreeable to plans will transport via ems RN to call report to [phone].  Jamonica Schoff, MSW, LCSWA [phone]  

## 2016-05-07 ENCOUNTER — Emergency Department
Admission: EM | Admit: 2016-05-07 | Discharge: 2016-05-07 | Discharge status: Against Medical Advice | Payer: Medicare HMO | Attending: Emergency Medicine | Admitting: Emergency Medicine

## 2016-05-07 ENCOUNTER — Emergency Department: Payer: Medicare HMO

## 2016-05-07 ENCOUNTER — Encounter: Payer: Self-pay | Admitting: Emergency Medicine

## 2016-05-07 DIAGNOSIS — I110 Hypertensive heart disease with heart failure: Secondary | ICD-10-CM | POA: Insufficient documentation

## 2016-05-07 DIAGNOSIS — R079 Chest pain, unspecified: Secondary | ICD-10-CM | POA: Diagnosis not present

## 2016-05-07 DIAGNOSIS — I200 Unstable angina: Secondary | ICD-10-CM

## 2016-05-07 DIAGNOSIS — Z7982 Long term (current) use of aspirin: Secondary | ICD-10-CM | POA: Insufficient documentation

## 2016-05-07 DIAGNOSIS — Z79899 Other long term (current) drug therapy: Secondary | ICD-10-CM | POA: Diagnosis not present

## 2016-05-07 DIAGNOSIS — I5032 Chronic diastolic (congestive) heart failure: Secondary | ICD-10-CM | POA: Insufficient documentation

## 2016-05-07 DIAGNOSIS — I10 Essential (primary) hypertension: Secondary | ICD-10-CM | POA: Diagnosis not present

## 2016-05-07 DIAGNOSIS — J45909 Unspecified asthma, uncomplicated: Secondary | ICD-10-CM | POA: Diagnosis not present

## 2016-05-07 DIAGNOSIS — J449 Chronic obstructive pulmonary disease, unspecified: Secondary | ICD-10-CM | POA: Insufficient documentation

## 2016-05-07 DIAGNOSIS — Z87891 Personal history of nicotine dependence: Secondary | ICD-10-CM | POA: Diagnosis not present

## 2016-05-07 LAB — HEPATIC FUNCTION PANEL
ALBUMIN: 4.2 g/dL (ref 3.5–5.0)
ALK PHOS: 80 U/L (ref 38–126)
ALT: 6 U/L — ABNORMAL LOW (ref 14–54)
AST: 28 U/L (ref 15–41)
Bilirubin, Direct: <0.1 mg/dL — ABNORMAL LOW (ref 0.1–0.5)
TOTAL PROTEIN: 6.8 g/dL (ref 6.5–8.1)
Total Bilirubin: 0.6 mg/dL (ref 0.3–1.2)

## 2016-05-07 LAB — BASIC METABOLIC PANEL
ANION GAP: 10 (ref 5–15)
BUN: 19 mg/dL (ref 6–20)
CO2: 23 mmol/L (ref 22–32)
Calcium: 9 mg/dL (ref 8.9–10.3)
Chloride: 104 mmol/L (ref 101–111)
Creatinine, Ser: 0.74 mg/dL (ref 0.44–1.00)
GFR calc Af Amer: >60 mL/min (ref >60)
GLUCOSE: 139 mg/dL — AB (ref 65–99)
POTASSIUM: 3.7 mmol/L (ref 3.5–5.1)
Sodium: 137 mmol/L (ref 135–145)

## 2016-05-07 LAB — CBC WITH DIFFERENTIAL/PLATELET
BASOS ABS: 0 10*3/uL (ref 0–0.1)
Basophils Relative: 0 %
Eosinophils Absolute: 0.1 10*3/uL (ref 0–0.7)
Eosinophils Relative: 2 %
HEMATOCRIT: 34.5 % — AB (ref 35.0–47.0)
HEMOGLOBIN: 11.8 g/dL — AB (ref 12.0–16.0)
Lymphocytes Relative: 22 %
Lymphs Abs: 1.7 10*3/uL (ref 1.0–3.6)
MCH: 31.8 pg (ref 26.0–34.0)
MCHC: 34.2 g/dL (ref 32.0–36.0)
MCV: 93.1 fL (ref 80.0–100.0)
MONO ABS: 0.8 10*3/uL (ref 0.2–0.9)
Monocytes Relative: 10 %
NEUTROS ABS: 5.3 10*3/uL (ref 1.4–6.5)
Neutrophils Relative %: 66 %
Platelets: 260 10*3/uL (ref 150–440)
RBC: 3.71 MIL/uL — AB (ref 3.80–5.20)
RDW: 15.6 % — AB (ref 11.5–14.5)
WBC: 7.9 10*3/uL (ref 3.6–11.0)

## 2016-05-07 LAB — TROPONIN I: TROPONIN I: 0.05 ng/mL — AB (ref <0.03)

## 2016-05-07 MED ORDER — ASPIRIN 81 MG PO CHEW
162.0000 mg | CHEWABLE_TABLET | Freq: Once | ORAL | Status: AC
  Administered 2016-05-07: 162 mg via ORAL
  Filled 2016-05-07: qty 2

## 2016-05-07 NOTE — ED Triage Notes (Signed)
Patient from [facility] via [facility] [employer]. Complaining of chest pressure and pain in the left arm and neck starting this morning. Patient denies SOB, reports mild nausea. Patient had MI 6 days ago with placement of 1 stent. Patient is  Alert and oriented x4.

## 2016-05-07 NOTE — ED Provider Notes (Signed)
[facility] Emergency Department Provider Note  ____________________________________________   First MD Initiated Contact with Patient 05/07/16 2009     (approximate)  I have reviewed the triage vital signs and the nursing notes.   HISTORY  Chief Complaint Chest Pain   HPI Hailey [name] is a 83 y.o. female who comes to the emergency department via EMS after a 45 minute episode of pressure-like substernal chest discomfort that happened while at rest in her nursing home. One week ago I admitted the patient for an ST elevation myocardial infarction. She was found to have a 95% mid LAD lesion which was successfully stented with a drug-eluting stent and a 99% distal lesion which was not amenable to stenting. She was discharged back to her facility yesterday with brilinta. She reports compliance with her medications.Pain was moderate severity aching radiating to her left shoulder. Nothing made it better or worse.   Past Medical History:  Diagnosis Date  . Anemia    Chronic  . Anxiety   . Arthritis   . Asthma   . Atrophic vaginitis   . Cerebral aneurysm    Hx of  . CHF (congestive heart failure) ([place])   . COPD (chronic obstructive pulmonary disease) ([place])   . Diverticulitis   . DVT (deep vein thrombosis) in pregnancy ([place]) 09/2008   a. LLE, recurrent hx, has IVC filter. Not on coumadin now with history of GI bleeding  . Fibrocystic disease of breast   . Fibromyalgia   . GERD (gastroesophageal reflux disease)   . Gout   . Gross hematuria   . Heart murmur   . History of colonoscopy   . Hyperlipidemia   . Hypertension   . Hypertrophic cardiomyopathy ([place])    a. Echo 4/09 with EF 70-75%, asymmetricy basal septal hypertrophy, mild MR without systolic anterior motion of the mitral valve, LVOT gradient to 130 mmHg with Valsalva b. Echo 7/13: EF 60-65%, mild focal basal septal hypertrophy, no significant LVOT gradient, no MV SAM c. Echo 2/15: EF 55-60%, HOCM,  resting LVOT gradient 29 mmHg, Valsalva LVOT gradient > 140 mmHg, mild LVH, mild TR, elevated PASP  . Incomplete bladder emptying   . Migraine headache    Hx of  . Obesity   . OSA (obstructive sleep apnea)    a. Intolerant to CPAP, wears 2L via n/c  . Osteoarthritis    Hx of left TKR  . PAF (paroxysmal atrial fibrillation) ([place])    a. Full-dose ASA alone 2/2 h/o GIB.  Marland Kitchen PAT (paroxysmal atrial tachycardia) ([place])   . 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 Pneumonia   . PUD (peptic ulcer disease)    With GI bleeding  . Pulmonary embolism ([place])   . Tremor     Patient Active Problem List   Diagnosis Date Noted  . Acute ST elevation myocardial infarction (STEMI) involving left anterior descending (LAD) coronary artery ([place]) 05/01/2016  . STEMI (ST elevation myocardial infarction) ([place]) 05/01/2016  . Protein-calorie malnutrition, severe 10/23/2015  . Uncontrollable vomiting   . Nausea and vomiting 10/22/2015  . Tremor 07/07/2015  . Respiratory failure ([place]) 05/19/2015  . Weakness 01/27/2015  . Abdomen enlarged 09/26/2013  . CTCL (cutaneous T-cell lymphoma) ([place]) 09/18/2013  . Anxiety 08/09/2013  . CCF (congestive cardiac failure) ([place]) 08/09/2013  . Chronic pain 08/09/2013  . Chronic diastolic CHF (congestive heart failure) ([place]) 07/12/2013  . Benign essential HTN 07/01/2013  . Bilateral leg edema 06/06/2013  .  PAF (paroxysmal atrial fibrillation) ([place]) 04/25/2013  . DOE (dyspnea on exertion) 10/11/2011  . COPD (chronic obstructive pulmonary disease) ([place]) 10/09/2011  . Cognitive decline 10/09/2011  . Behavior concern 08/23/2011  . Chest pain 10/26/2010  . Palpitations 10/26/2010  . PHLEBITIS AND THROMBOPHLEBITIS OF FEMORAL VEIN 10/13/2008  . DEEP VENOUS THROMBOPHLEBITIS, LEG, LEFT 10/09/2008  . GASTRIC ULCER, ACUTE 10/09/2008  . PERSONAL HX COLONIC POLYPS 09/02/2008  . FATIGUE 08/27/2008  . HOCM (hypertrophic obstructive cardiomyopathy)  ([place]) 11/06/2007  . HYPOTENSION, ORTHOSTATIC 11/06/2007  . ADENOMATOUS COLONIC POLYP 04/12/2007  . HIATAL HERNIA 04/12/2007  . Diverticulosis of colon (without mention of hemorrhage) 04/12/2007  . Hyperlipidemia 02/03/2007  . ANEMIA, CHRONIC 02/03/2007  . MIGRAINE HEADACHE 02/03/2007  . Essential hypertension 02/03/2007  . CEREBRAL ANEURYSM 02/03/2007  . GASTROESOPHAGEAL REFLUX DISEASE 02/03/2007  . OSTEOARTHRITIS 02/03/2007  . FIBROMYALGIA 02/03/2007  . CHRONIC FATIGUE SYNDROME 02/03/2007  . MITRAL VALVE PROLAPSE, HX OF 02/03/2007  . PULMONARY EMBOLISM, HX OF 02/03/2007  . TOTAL KNEE REPLACEMENT, LEFT, HX OF 02/03/2007  . HYSTERECTOMY, HX OF 02/03/2007  . Other acquired absence of organ 02/03/2007  . INGUINAL HERNIORRHAPHY, RIGHT, HX OF 02/03/2007    Past Surgical History:  Procedure Laterality Date  . ABDOMINAL HYSTERECTOMY    . APPENDECTOMY    . BLADDER SUSPENSION    . CARDIAC CATHETERIZATION  2009   No significant CAD  . CARDIOVASCULAR STRESS TEST     a. Lexiscan Myoview 3/14: EF 76%, no evidence of ischemia or WMAs  . cataract surgery    . CEREBRAL ANEURYSM REPAIR  1998  . CORONARY STENT INTERVENTION N/A 05/01/2016   Procedure: Coronary Stent Intervention;  Surgeon: Yolonda Kida, MD;  Location: [facility];  Service: Cardiovascular;  Laterality: N/A;  . ESOPHAGOGASTRODUODENOSCOPY (EGD) WITH PROPOFOL N/A 11/24/2015   Procedure: ESOPHAGOGASTRODUODENOSCOPY (EGD) WITH PROPOFOL;  Surgeon: Lollie Sails, MD;  Location: [facility] ENDOSCOPY;  Service: Endoscopy;  Laterality: N/A;  . FOOT SURGERY     Bilateral  . INGUINAL HERNIA REPAIR  1968   Right  . KNEE ARTHROSCOPY  2009   Right  . LEFT HEART CATH AND CORONARY ANGIOGRAPHY N/A 05/01/2016   Procedure: Left Heart Cath and Coronary Angiography;  Surgeon: Yolonda Kida, MD;  Location: [facility];  Service: Cardiovascular;  Laterality: N/A;  . SHOULDER SURGERY  1972   Right   Left 2012  .  TONSILLECTOMY AND ADENOIDECTOMY    . TOTAL KNEE ARTHROPLASTY     Left    Prior to Admission medications   Medication Sig Start Date End Date Taking? Authorizing Provider  acetaminophen (TYLENOL) 325 MG tablet Take 2 tablets (650 mg total) by mouth every 6 (six) hours as needed for mild pain or moderate pain (temp > 101.5). 05/25/15   Nicholes Mango, MD  albuterol (PROVENTIL HFA;VENTOLIN HFA) 108 (90 Base) MCG/ACT inhaler Inhale 1 puff into the lungs every 6 (six) hours as needed for wheezing or shortness of breath.    Historical Provider, MD  albuterol (PROVENTIL) (2.5 MG/3ML) 0.083% nebulizer solution Take 3 mLs (2.5 mg total) by nebulization every 4 (four) hours as needed for wheezing or shortness of breath. 05/25/15   Nicholes Mango, MD  amiodarone (PACERONE) 200 MG tablet Take 1 tablet (200 mg total) by mouth daily. Pt is able to take an additional tablet if needed for breakthrough arrhythmias. 08/04/15   Minna Merritts, MD  aspirin EC 81 MG EC tablet Take 1 tablet (81 mg total) by  mouth daily. 05/06/16   Max Sane, MD  atorvastatin (LIPITOR) 80 MG tablet Take 1 tablet (80 mg total) by mouth daily at 6 PM. 05/06/16   Max Sane, MD  carbidopa-levodopa (SINEMET IR) 25-250 MG tablet Take 1 tablet by mouth 4 (four) times daily. 05/06/16   Max Sane, MD  chlorhexidine (PERIDEX) 0.12 % solution 15 mLs by Mouth Rinse route 2 (two) times daily. 04/14/16   Historical Provider, MD  diazepam (VALIUM) 2 MG tablet Take 1 tablet (2 mg total) by mouth every 6 (six) hours as needed for anxiety (or back pain). 05/06/16   Max Sane, MD  dimenhyDRINATE (DRAMAMINE) 50 MG tablet Take 25-50 mg by mouth every 6 (six) hours as needed for dizziness.     Historical Provider, MD  docusate sodium (COLACE) 100 MG capsule Take 100 mg by mouth 2 (two) times daily as needed for mild constipation.    Historical Provider, MD  DULoxetine (CYMBALTA) 30 MG capsule Take 3 capsules (90 mg total) by mouth daily. 05/07/16   Max Sane, MD    feeding supplement, ENSURE ENLIVE, (ENSURE ENLIVE) LIQD Take 237 mLs by mouth 2 (two) times daily between meals. 01/28/15   Bettey Costa, MD  fluticasone (FLONASE) 50 MCG/ACT nasal spray Place 2 sprays into both nostrils daily as needed for rhinitis.    Historical Provider, MD  gabapentin (NEURONTIN) 100 MG capsule Take 200-400 mg by mouth 3 (three) times daily. Pt takes three capsules in the morning, two capsules in the evening, and four capsules at bedtime.    Historical Provider, MD  isosorbide mononitrate (IMDUR) 30 MG 24 hr tablet Take 1 tablet (30 mg total) by mouth daily. 05/06/16   Max Sane, MD  Levothyroxine Sodium 75 MCG CAPS Take 50 mcg by mouth daily before breakfast.     Historical Provider, MD  losartan (COZAAR) 25 MG tablet Take 25 mg by mouth daily. 11/27/15   Historical Provider, MD  mometasone-formoterol (DULERA) 100-5 MCG/ACT AERO Inhale 2 puffs into the lungs 2 (two) times daily. 05/25/15   Nicholes Mango, MD  montelukast (SINGULAIR) 10 MG tablet Take 10 mg by mouth at bedtime.    Historical Provider, MD  ondansetron (ZOFRAN-ODT) 4 MG disintegrating tablet Take 4 mg by mouth every 8 (eight) hours as needed for nausea or vomiting.    Historical Provider, MD  oxyCODONE (OXY IR/ROXICODONE) 5 MG immediate release tablet Take 1 tablet (5 mg total) by mouth every 6 (six) hours as needed for moderate pain or severe pain. 05/06/16   Max Sane, MD  pantoprazole (PROTONIX) 40 MG tablet Take 40 mg by mouth daily.    Historical Provider, MD  polyethylene glycol (MIRALAX / GLYCOLAX) packet Take 17 g by mouth daily as needed for moderate constipation.     Historical Provider, MD  potassium chloride (K-DUR,KLOR-CON) 10 MEQ tablet Take 2 tablets (20 mEq total) by mouth 2 (two) times daily. 01/18/16   Minna Merritts, MD  senna (SENOKOT) 8.6 MG TABS tablet Take 2 tablets (17.2 mg total) by mouth 2 (two) times daily. 05/25/15   Nicholes Mango, MD  sucralfate (CARAFATE) 1 g tablet Take 1 g by mouth 2 (two)  times daily. 03/21/16   Historical Provider, MD  ticagrelor (BRILINTA) 90 MG TABS tablet Take 1 tablet (90 mg total) by mouth 2 (two) times daily. 05/06/16   Max Sane, MD  tiotropium (SPIRIVA) 18 MCG inhalation capsule Place 18 mcg into inhaler and inhale daily.    Historical Provider, MD  torsemide (DEMADEX) 20 MG tablet Take 1 tablet (20 mg total) by mouth 2 (two) times daily. 10/24/15   Gladstone Lighter, MD  triamcinolone ointment (KENALOG) 0.1 % Apply 1 application topically 2 (two) times daily as needed (for rash).     Historical Provider, MD    Allergies Aspirin; Codeine; Compazine [prochlorperazine]; Ketorolac tromethamine; Nsaids; Phenazopyridine hcl; and Macrodantin [nitrofurantoin macrocrystal]  Family History  Problem Relation Age of Onset  . Pancreatic cancer Mother   . Hypertension Mother     father  . Diabetes Mellitus II Mother     Sister  . Colon cancer Father   . Colon polyps Father   . Heart disease Father   . Stroke Father   . Heart disease Sister     More than 1 sister  . Colon cancer Brother   . Colon polyps Other     Siblings  . Ulcers Brother     Social History Social History  Substance Use Topics  . Smoking status: Former Smoker    Packs/day: 1.00    Years: 60.00    Types: Cigarettes    Quit date: 12/15/2008  . Smokeless tobacco: Never Used     Comment: smoked for 50 years quit 5 + years  . Alcohol use 0.0 oz/week     Comment: Socially    Review of Systems Constitutional: No fever/chills Eyes: No visual changes. ENT: No sore throat. Cardiovascular: Positive chest pain. Respiratory: Denies shortness of breath. Gastrointestinal: No abdominal pain.  No nausea, no vomiting.  No diarrhea.  No constipation. Genitourinary: Negative for dysuria. Musculoskeletal: Negative for back pain. Skin: Negative for rash. Neurological: Negative for headaches, focal weakness or numbness.  10-point ROS otherwise  negative.  ____________________________________________   PHYSICAL EXAM:  VITAL SIGNS: ED Triage Vitals  Enc Vitals Group     BP 05/07/16 2005 (!) 128/54     Pulse Rate 05/07/16 2005 65     Resp 05/07/16 2005 20     Temp 05/07/16 2005 98.1 F (36.7 C)     Temp Source 05/07/16 2005 Oral     SpO2 05/07/16 2005 96 %     Weight 05/07/16 2006 160 lb (72.6 kg)     Height 05/07/16 2006 5' (1.524 m)     Head Circumference --      Peak Flow --      Pain Score 05/07/16 2006 4     Pain Loc --      Pain Edu? --      Excl. in [place]? --     Constitutional: Alert and oriented x 4 well appearing nontoxic no diaphoresis speaks in full, clear sentences Eyes: PERRL EOMI. Head: Atraumatic. Nose: No congestion/rhinnorhea. Mouth/Throat: No trismus Neck: No stridor.   Cardiovascular: Normal rate, regular rhythm. 3/6 systolic murmur.  Good peripheral circulation. Respiratory: Normal respiratory effort.  No retractions. Lungs CTAB and moving good air Gastrointestinal: Soft nondistended nontender no rebound no guarding no peritonitis no McBurney's tenderness negative Rovsing's no costovertebral tenderness negative Murphy's Musculoskeletal: No lower extremity edema   Neurologic:  Normal speech and language. No gross focal neurologic deficits are appreciated. Skin:  Skin is warm, dry and intact. No rash noted. Psychiatric: Mood and affect are normal. Speech and behavior are normal.    ____________________________________________   DIFFERENTIAL  Non-ST elevation myocardial infarction, unstable angina, stable angina, Boerhaave syndrome, pulmonary embolism, aortic dissection ____________________________________________   LABS (all labs ordered are listed, but only abnormal results are displayed)  Labs Reviewed  BASIC  METABOLIC PANEL - Abnormal; Notable for the following:       Result Value   Glucose, Bld 139 (*)    All other components within normal limits  HEPATIC FUNCTION PANEL - Abnormal;  Notable for the following:    ALT 6 (*)    Bilirubin, Direct <0.1 (*)    All other components within normal limits  TROPONIN I - Abnormal; Notable for the following:    Troponin I 0.05 (*)    All other components within normal limits  CBC WITH DIFFERENTIAL/PLATELET - Abnormal; Notable for the following:    RBC 3.71 (*)    Hemoglobin 11.8 (*)    HCT 34.5 (*)    RDW 15.6 (*)    All other components within normal limits    First troponin is slightly elevated __________________________________________  EKG  ED ECG REPORT I, Darel Hong, the attending physician, personally viewed and interpreted this ECG.  Date: 05/08/2016 Rate: 67 Rhythm: normal sinus rhythm QRS Axis: normal Intervals: normal ST/T Wave abnormalities: normal Conduction Disturbances: none Narrative Interpretation: unremarkable  ____________________________________________  RADIOLOGY  Chest x-ray with no acute disease ____________________________________________   PROCEDURES  Procedure(s) performed: no  Procedures  Critical Care performed: no  ____________________________________________   INITIAL IMPRESSION / ASSESSMENT AND PLAN / ED COURSE  Pertinent labs & imaging results that were available during my care of the patient were reviewed by me and considered in my medical decision making (see chart for details).  The patient's history is concerning for unstable angina. Fortunately her first EKG is nonischemic. She is not currently in pain. Labs are pending.     ----------------------------------------- 9:13 PM on 05/07/2016 -----------------------------------------  I discussed the case with on-call [facility] Cardiologist Dr. Ubaldo Glassing who recommended a second troponin and if that were not rising the patient would be stable to be discharged back to her facility with dual antiplatelet therapy. I discussed this with the patient and she said that she has not had any pain since her previous episode  and she would prefer to go home now which I think is reasonable. She understands that there is a risk that she might have a heart attack, however she is comfortable with this risk. She has capacity to make medical decisions and understand she is more than welcome to return to the hospital at any point if she changes her mind. She is leaving the hospital [place]. ____________________________________________   FINAL CLINICAL IMPRESSION(S) / ED DIAGNOSES  Final diagnoses:  Unstable angina ([employer])      NEW MEDICATIONS STARTED DURING THIS VISIT:  Discharge Medication List as of 05/07/2016  9:18 PM       Note:  This document was prepared using Dragon voice recognition software and may include unintentional dictation errors.     Darel Hong, MD 05/08/16 [phone]

## 2016-05-07 NOTE — Discharge Instructions (Signed)
Your leaving the hospital [place]. Please know they are more than welcome to return at any point if we are happy to continue your evaluation.  It was a pleasure to take care of you today, and thank you for coming to our emergency department.  If you have any questions or concerns before leaving please ask the nurse to grab me and I'm more than happy to go through your aftercare instructions again.  If you were prescribed any opioid pain medication today such as Norco, Vicodin, Percocet, morphine, hydrocodone, or oxycodone please make sure you do not drive when you are taking this medication as it can alter your ability to drive safely.  If you have any concerns once you are home that you are not improving or are in fact getting worse before you can make it to your follow-up appointment, please do not hesitate to call 911 and come back for further evaluation.  Darel Hong MD  Results for orders placed or performed during the hospital encounter of 95/09/32  Basic metabolic panel  Result Value Ref Range   Sodium 137 135 - 145 mmol/L   Potassium 3.7 3.5 - 5.1 mmol/L   Chloride 104 101 - 111 mmol/L   CO2 23 22 - 32 mmol/L   Glucose, Bld 139 (H) 65 - 99 mg/dL   BUN 19 6 - 20 mg/dL   Creatinine, Ser 0.74 0.44 - 1.00 mg/dL   Calcium 9.0 8.9 - 10.3 mg/dL   GFR calc non Af Amer >60 >60 mL/min   GFR calc Af Amer >60 >60 mL/min   Anion gap 10 5 - 15  Hepatic function panel  Result Value Ref Range   Total Protein 6.8 6.5 - 8.1 g/dL   Albumin 4.2 3.5 - 5.0 g/dL   AST 28 15 - 41 U/L   ALT 6 (L) 14 - 54 U/L   Alkaline Phosphatase 80 38 - 126 U/L   Total Bilirubin 0.6 0.3 - 1.2 mg/dL   Bilirubin, Direct <0.1 (L) 0.1 - 0.5 mg/dL   Indirect Bilirubin NOT CALCULATED 0.3 - 0.9 mg/dL  Troponin I  Result Value Ref Range   Troponin I 0.05 (HH) <0.03 ng/mL  CBC with Differential  Result Value Ref Range   WBC 7.9 3.6 - 11.0 K/uL   RBC 3.71 (L) 3.80 - 5.20 MIL/uL   Hemoglobin 11.8 (L)  12.0 - 16.0 g/dL   HCT 34.5 (L) 35.0 - 47.0 %   MCV 93.1 80.0 - 100.0 fL   MCH 31.8 26.0 - 34.0 pg   MCHC 34.2 32.0 - 36.0 g/dL   RDW 15.6 (H) 11.5 - 14.5 %   Platelets 260 150 - 440 K/uL   Neutrophils Relative % 66 %   Neutro Abs 5.3 1.4 - 6.5 K/uL   Lymphocytes Relative 22 %   Lymphs Abs 1.7 1.0 - 3.6 K/uL   Monocytes Relative 10 %   Monocytes Absolute 0.8 0.2 - 0.9 K/uL   Eosinophils Relative 2 %   Eosinophils Absolute 0.1 0 - 0.7 K/uL   Basophils Relative 0 %   Basophils Absolute 0.0 0 - 0.1 K/uL   Dg Chest 2 View  Result Date: 05/07/2016 CLINICAL DATA:  Chest pressure and pain EXAM: CHEST  2 VIEW COMPARISON:  May 01, 2016 FINDINGS: Minimal atelectasis in the bases. The heart, hila, mediastinum, lungs, and pleura are otherwise unremarkable. IMPRESSION: No active cardiopulmonary disease. Electronically Signed   By: Dorise Bullion III M.D   On:  05/07/2016 20:50   Dg Chest Port 1 View  Result Date: 05/01/2016 CLINICAL DATA:  83 year old female with chest pain.  Likely cardiac. EXAM: PORTABLE CHEST 1 VIEW COMPARISON:  Chest radiograph dated 12/04/2015 FINDINGS: Mild emphysematous changes of the lungs with interstitial coarsening. Minimal left lung base atelectasis/ scarring. No focal consolidation, pleural effusion, or pneumothorax. The cardiac silhouette is within normal limits. The aorta is mildly tortuous. Osteopenia with degenerative changes of the spine. No acute osseous pathology. IMPRESSION: No active disease. Electronically Signed   By: Anner Crete M.D.   On: 05/01/2016 03:26

## 2016-05-09 DIAGNOSIS — I5032 Chronic diastolic (congestive) heart failure: Secondary | ICD-10-CM | POA: Diagnosis not present

## 2016-05-09 DIAGNOSIS — I25118 Atherosclerotic heart disease of native coronary artery with other forms of angina pectoris: Secondary | ICD-10-CM | POA: Diagnosis not present

## 2016-05-09 DIAGNOSIS — J439 Emphysema, unspecified: Secondary | ICD-10-CM | POA: Diagnosis not present

## 2016-05-17 DIAGNOSIS — I110 Hypertensive heart disease with heart failure: Secondary | ICD-10-CM | POA: Diagnosis not present

## 2016-05-17 DIAGNOSIS — M109 Gout, unspecified: Secondary | ICD-10-CM | POA: Diagnosis not present

## 2016-05-17 DIAGNOSIS — J439 Emphysema, unspecified: Secondary | ICD-10-CM | POA: Diagnosis not present

## 2016-05-17 DIAGNOSIS — I480 Paroxysmal atrial fibrillation: Secondary | ICD-10-CM | POA: Diagnosis not present

## 2016-05-17 DIAGNOSIS — D649 Anemia, unspecified: Secondary | ICD-10-CM | POA: Diagnosis not present

## 2016-05-17 DIAGNOSIS — I951 Orthostatic hypotension: Secondary | ICD-10-CM | POA: Diagnosis not present

## 2016-05-17 DIAGNOSIS — I2102 ST elevation (STEMI) myocardial infarction involving left anterior descending coronary artery: Secondary | ICD-10-CM | POA: Diagnosis not present

## 2016-05-17 DIAGNOSIS — I5032 Chronic diastolic (congestive) heart failure: Secondary | ICD-10-CM | POA: Diagnosis not present

## 2016-05-17 DIAGNOSIS — G20 Parkinson's disease: Secondary | ICD-10-CM | POA: Diagnosis not present

## 2016-05-26 DIAGNOSIS — G20 Parkinson's disease: Secondary | ICD-10-CM | POA: Diagnosis not present

## 2016-05-26 DIAGNOSIS — I110 Hypertensive heart disease with heart failure: Secondary | ICD-10-CM | POA: Diagnosis not present

## 2016-05-26 DIAGNOSIS — J439 Emphysema, unspecified: Secondary | ICD-10-CM | POA: Diagnosis not present

## 2016-05-26 DIAGNOSIS — M109 Gout, unspecified: Secondary | ICD-10-CM | POA: Diagnosis not present

## 2016-05-26 DIAGNOSIS — I2102 ST elevation (STEMI) myocardial infarction involving left anterior descending coronary artery: Secondary | ICD-10-CM | POA: Diagnosis not present

## 2016-05-26 DIAGNOSIS — D649 Anemia, unspecified: Secondary | ICD-10-CM | POA: Diagnosis not present

## 2016-05-26 DIAGNOSIS — I951 Orthostatic hypotension: Secondary | ICD-10-CM | POA: Diagnosis not present

## 2016-05-26 DIAGNOSIS — I480 Paroxysmal atrial fibrillation: Secondary | ICD-10-CM | POA: Diagnosis not present

## 2016-05-26 DIAGNOSIS — I5032 Chronic diastolic (congestive) heart failure: Secondary | ICD-10-CM | POA: Diagnosis not present

## 2016-05-27 ENCOUNTER — Other Ambulatory Visit: Payer: Self-pay | Admitting: Cardiovascular Disease

## 2016-05-27 DIAGNOSIS — I110 Hypertensive heart disease with heart failure: Secondary | ICD-10-CM | POA: Diagnosis not present

## 2016-05-27 DIAGNOSIS — G20 Parkinson's disease: Secondary | ICD-10-CM | POA: Diagnosis not present

## 2016-05-27 DIAGNOSIS — M109 Gout, unspecified: Secondary | ICD-10-CM | POA: Diagnosis not present

## 2016-05-27 DIAGNOSIS — I5032 Chronic diastolic (congestive) heart failure: Secondary | ICD-10-CM | POA: Diagnosis not present

## 2016-05-27 DIAGNOSIS — D649 Anemia, unspecified: Secondary | ICD-10-CM | POA: Diagnosis not present

## 2016-05-27 DIAGNOSIS — J439 Emphysema, unspecified: Secondary | ICD-10-CM | POA: Diagnosis not present

## 2016-05-27 DIAGNOSIS — I951 Orthostatic hypotension: Secondary | ICD-10-CM | POA: Diagnosis not present

## 2016-05-27 DIAGNOSIS — I480 Paroxysmal atrial fibrillation: Secondary | ICD-10-CM | POA: Diagnosis not present

## 2016-05-27 DIAGNOSIS — I2102 ST elevation (STEMI) myocardial infarction involving left anterior descending coronary artery: Secondary | ICD-10-CM | POA: Diagnosis not present

## 2016-05-27 MED ORDER — POTASSIUM CHLORIDE CRYS ER 10 MEQ PO TBCR: 10.0000 meq | EXTENDED_RELEASE_TABLET | Freq: Three times a day (TID) | ORAL | 1 refills | Status: DC

## 2016-05-31 ENCOUNTER — Inpatient Hospital Stay
Admission: EM | Admit: 2016-05-31 | Discharge: 2016-06-03 | DRG: 871 | Disposition: A | Discharge status: Skilled Nursing Facility | Payer: Medicare HMO | Attending: Internal Medicine | Admitting: Internal Medicine

## 2016-05-31 ENCOUNTER — Emergency Department: Payer: Medicare HMO

## 2016-05-31 DIAGNOSIS — I110 Hypertensive heart disease with heart failure: Secondary | ICD-10-CM | POA: Diagnosis present

## 2016-05-31 DIAGNOSIS — Z86718 Personal history of other venous thrombosis and embolism: Secondary | ICD-10-CM | POA: Diagnosis not present

## 2016-05-31 DIAGNOSIS — Z886 Allergy status to analgesic agent status: Secondary | ICD-10-CM

## 2016-05-31 DIAGNOSIS — R0602 Shortness of breath: Secondary | ICD-10-CM

## 2016-05-31 DIAGNOSIS — E785 Hyperlipidemia, unspecified: Secondary | ICD-10-CM | POA: Diagnosis present

## 2016-05-31 DIAGNOSIS — R319 Hematuria, unspecified: Secondary | ICD-10-CM

## 2016-05-31 DIAGNOSIS — A419 Sepsis, unspecified organism: Secondary | ICD-10-CM | POA: Diagnosis present

## 2016-05-31 DIAGNOSIS — M1990 Unspecified osteoarthritis, unspecified site: Secondary | ICD-10-CM | POA: Diagnosis present

## 2016-05-31 DIAGNOSIS — J439 Emphysema, unspecified: Secondary | ICD-10-CM | POA: Diagnosis not present

## 2016-05-31 DIAGNOSIS — K219 Gastro-esophageal reflux disease without esophagitis: Secondary | ICD-10-CM | POA: Diagnosis present

## 2016-05-31 DIAGNOSIS — G8929 Other chronic pain: Secondary | ICD-10-CM | POA: Diagnosis present

## 2016-05-31 DIAGNOSIS — I480 Paroxysmal atrial fibrillation: Secondary | ICD-10-CM | POA: Diagnosis not present

## 2016-05-31 DIAGNOSIS — R531 Weakness: Secondary | ICD-10-CM | POA: Diagnosis not present

## 2016-05-31 DIAGNOSIS — I252 Old myocardial infarction: Secondary | ICD-10-CM

## 2016-05-31 DIAGNOSIS — C84A0 Cutaneous T-cell lymphoma, unspecified, unspecified site: Secondary | ICD-10-CM | POA: Diagnosis present

## 2016-05-31 DIAGNOSIS — E669 Obesity, unspecified: Secondary | ICD-10-CM | POA: Diagnosis present

## 2016-05-31 DIAGNOSIS — D638 Anemia in other chronic diseases classified elsewhere: Secondary | ICD-10-CM | POA: Diagnosis present

## 2016-05-31 DIAGNOSIS — N390 Urinary tract infection, site not specified: Secondary | ICD-10-CM | POA: Diagnosis not present

## 2016-05-31 DIAGNOSIS — I422 Other hypertrophic cardiomyopathy: Secondary | ICD-10-CM | POA: Diagnosis present

## 2016-05-31 DIAGNOSIS — Z881 Allergy status to other antibiotic agents status: Secondary | ICD-10-CM

## 2016-05-31 DIAGNOSIS — I248 Other forms of acute ischemic heart disease: Secondary | ICD-10-CM | POA: Diagnosis present

## 2016-05-31 DIAGNOSIS — K5792 Diverticulitis of intestine, part unspecified, without perforation or abscess without bleeding: Secondary | ICD-10-CM | POA: Diagnosis present

## 2016-05-31 DIAGNOSIS — F329 Major depressive disorder, single episode, unspecified: Secondary | ICD-10-CM | POA: Diagnosis present

## 2016-05-31 DIAGNOSIS — R4182 Altered mental status, unspecified: Secondary | ICD-10-CM

## 2016-05-31 DIAGNOSIS — I471 Supraventricular tachycardia: Secondary | ICD-10-CM | POA: Diagnosis not present

## 2016-05-31 DIAGNOSIS — Z885 Allergy status to narcotic agent status: Secondary | ICD-10-CM

## 2016-05-31 DIAGNOSIS — Z7982 Long term (current) use of aspirin: Secondary | ICD-10-CM

## 2016-05-31 DIAGNOSIS — M549 Dorsalgia, unspecified: Secondary | ICD-10-CM | POA: Diagnosis present

## 2016-05-31 DIAGNOSIS — R310 Gross hematuria: Secondary | ICD-10-CM | POA: Diagnosis present

## 2016-05-31 DIAGNOSIS — Z87891 Personal history of nicotine dependence: Secondary | ICD-10-CM

## 2016-05-31 DIAGNOSIS — M109 Gout, unspecified: Secondary | ICD-10-CM | POA: Diagnosis not present

## 2016-05-31 DIAGNOSIS — R110 Nausea: Secondary | ICD-10-CM | POA: Diagnosis not present

## 2016-05-31 DIAGNOSIS — Z6828 Body mass index (BMI) 28.0-28.9, adult: Secondary | ICD-10-CM

## 2016-05-31 DIAGNOSIS — Z66 Do not resuscitate: Secondary | ICD-10-CM | POA: Diagnosis present

## 2016-05-31 DIAGNOSIS — B9620 Unspecified Escherichia coli [E. coli] as the cause of diseases classified elsewhere: Secondary | ICD-10-CM | POA: Diagnosis not present

## 2016-05-31 DIAGNOSIS — G4733 Obstructive sleep apnea (adult) (pediatric): Secondary | ICD-10-CM | POA: Diagnosis present

## 2016-05-31 DIAGNOSIS — Z79899 Other long term (current) drug therapy: Secondary | ICD-10-CM

## 2016-05-31 DIAGNOSIS — Z7951 Long term (current) use of inhaled steroids: Secondary | ICD-10-CM

## 2016-05-31 DIAGNOSIS — J449 Chronic obstructive pulmonary disease, unspecified: Secondary | ICD-10-CM | POA: Diagnosis not present

## 2016-05-31 DIAGNOSIS — E876 Hypokalemia: Secondary | ICD-10-CM | POA: Diagnosis not present

## 2016-05-31 DIAGNOSIS — I5032 Chronic diastolic (congestive) heart failure: Secondary | ICD-10-CM | POA: Diagnosis present

## 2016-05-31 DIAGNOSIS — G20 Parkinson's disease: Secondary | ICD-10-CM | POA: Diagnosis not present

## 2016-05-31 DIAGNOSIS — Z9119 Patient's noncompliance with other medical treatment and regimen: Secondary | ICD-10-CM

## 2016-05-31 DIAGNOSIS — E039 Hypothyroidism, unspecified: Secondary | ICD-10-CM | POA: Diagnosis present

## 2016-05-31 DIAGNOSIS — K279 Peptic ulcer, site unspecified, unspecified as acute or chronic, without hemorrhage or perforation: Secondary | ICD-10-CM | POA: Diagnosis present

## 2016-05-31 DIAGNOSIS — I951 Orthostatic hypotension: Secondary | ICD-10-CM | POA: Diagnosis not present

## 2016-05-31 DIAGNOSIS — R6889 Other general symptoms and signs: Secondary | ICD-10-CM | POA: Diagnosis not present

## 2016-05-31 DIAGNOSIS — E871 Hypo-osmolality and hyponatremia: Secondary | ICD-10-CM | POA: Diagnosis not present

## 2016-05-31 DIAGNOSIS — Z96653 Presence of artificial knee joint, bilateral: Secondary | ICD-10-CM | POA: Diagnosis present

## 2016-05-31 DIAGNOSIS — Z955 Presence of coronary angioplasty implant and graft: Secondary | ICD-10-CM

## 2016-05-31 DIAGNOSIS — D649 Anemia, unspecified: Secondary | ICD-10-CM | POA: Diagnosis not present

## 2016-05-31 DIAGNOSIS — M797 Fibromyalgia: Secondary | ICD-10-CM | POA: Diagnosis present

## 2016-05-31 DIAGNOSIS — Z95828 Presence of other vascular implants and grafts: Secondary | ICD-10-CM

## 2016-05-31 DIAGNOSIS — I2699 Other pulmonary embolism without acute cor pulmonale: Secondary | ICD-10-CM | POA: Diagnosis not present

## 2016-05-31 DIAGNOSIS — Z8679 Personal history of other diseases of the circulatory system: Secondary | ICD-10-CM

## 2016-05-31 DIAGNOSIS — I2102 ST elevation (STEMI) myocardial infarction involving left anterior descending coronary artery: Secondary | ICD-10-CM | POA: Diagnosis not present

## 2016-05-31 DIAGNOSIS — F419 Anxiety disorder, unspecified: Secondary | ICD-10-CM | POA: Diagnosis not present

## 2016-05-31 DIAGNOSIS — I2510 Atherosclerotic heart disease of native coronary artery without angina pectoris: Secondary | ICD-10-CM | POA: Diagnosis not present

## 2016-05-31 DIAGNOSIS — I509 Heart failure, unspecified: Secondary | ICD-10-CM | POA: Diagnosis not present

## 2016-05-31 DIAGNOSIS — A4151 Sepsis due to Escherichia coli [E. coli]: Principal | ICD-10-CM | POA: Diagnosis present

## 2016-05-31 DIAGNOSIS — Z888 Allergy status to other drugs, medicaments and biological substances status: Secondary | ICD-10-CM

## 2016-05-31 DIAGNOSIS — G43909 Migraine, unspecified, not intractable, without status migrainosus: Secondary | ICD-10-CM | POA: Diagnosis present

## 2016-05-31 DIAGNOSIS — Z7401 Bed confinement status: Secondary | ICD-10-CM | POA: Diagnosis not present

## 2016-05-31 LAB — CBC
HCT: 33.7 % — ABNORMAL LOW (ref 35.0–47.0)
Hemoglobin: 11.6 g/dL — ABNORMAL LOW (ref 12.0–16.0)
MCH: 31.4 pg (ref 26.0–34.0)
MCHC: 34.3 g/dL (ref 32.0–36.0)
MCV: 91.7 fL (ref 80.0–100.0)
PLATELETS: 137 10*3/uL — AB (ref 150–440)
RBC: 3.68 MIL/uL — ABNORMAL LOW (ref 3.80–5.20)
RDW: 14.8 % — AB (ref 11.5–14.5)
WBC: 12.4 10*3/uL — ABNORMAL HIGH (ref 3.6–11.0)

## 2016-05-31 LAB — COMPREHENSIVE METABOLIC PANEL
ALT: 21 U/L (ref 14–54)
AST: 64 U/L — AB (ref 15–41)
Albumin: 3.5 g/dL (ref 3.5–5.0)
Alkaline Phosphatase: 116 U/L (ref 38–126)
Anion gap: 12 (ref 5–15)
BUN: 13 mg/dL (ref 6–20)
CHLORIDE: 99 mmol/L — AB (ref 101–111)
CO2: 21 mmol/L — AB (ref 22–32)
CREATININE: 0.79 mg/dL (ref 0.44–1.00)
Calcium: 9 mg/dL (ref 8.9–10.3)
GFR calc Af Amer: >60 mL/min (ref >60)
Glucose, Bld: 137 mg/dL — ABNORMAL HIGH (ref 65–99)
Potassium: 2.8 mmol/L — ABNORMAL LOW (ref 3.5–5.1)
Sodium: 132 mmol/L — ABNORMAL LOW (ref 135–145)
Total Bilirubin: 1.3 mg/dL — ABNORMAL HIGH (ref 0.3–1.2)
Total Protein: 6.3 g/dL — ABNORMAL LOW (ref 6.5–8.1)

## 2016-05-31 LAB — URINALYSIS, ROUTINE W REFLEX MICROSCOPIC
Bilirubin Urine: NEGATIVE
Glucose, UA: NEGATIVE mg/dL
Ketones, ur: 5 mg/dL — AB
Nitrite: POSITIVE — AB
PROTEIN: 100 mg/dL — AB
Specific Gravity, Urine: 1.015 (ref 1.005–1.030)
Squamous Epithelial / LPF: NONE SEEN
pH: 5 (ref 5.0–8.0)

## 2016-05-31 LAB — INFLUENZA PANEL BY PCR (TYPE A & B)
Influenza A By PCR: NEGATIVE
Influenza B By PCR: NEGATIVE

## 2016-05-31 LAB — PROTIME-INR
INR: 1.23
Prothrombin Time: 15.6 seconds — ABNORMAL HIGH (ref 11.4–15.2)

## 2016-05-31 LAB — LIPASE, BLOOD: LIPASE: 10 U/L — AB (ref 11–51)

## 2016-05-31 LAB — PROCALCITONIN: PROCALCITONIN: 3.35 ng/mL

## 2016-05-31 LAB — LACTIC ACID, PLASMA: Lactic Acid, Venous: 3 mmol/L (ref 0.5–1.9)

## 2016-05-31 LAB — TROPONIN I: Troponin I: 0.05 ng/mL (ref <0.03)

## 2016-05-31 MED ORDER — DEXTROSE 5 % IV SOLN: 1.0000 g | Freq: Once | INTRAVENOUS | Status: DC

## 2016-05-31 MED ORDER — CEFTRIAXONE SODIUM-DEXTROSE 1-3.74 GM-% IV SOLR
1.0000 g | Freq: Once | INTRAVENOUS | Status: AC
  Administered 2016-05-31: 1 g via INTRAVENOUS
  Filled 2016-05-31: qty 50

## 2016-05-31 MED ORDER — SODIUM CHLORIDE 0.9 % IV BOLUS (SEPSIS)
1000.0000 mL | Freq: Once | INTRAVENOUS | Status: AC
  Administered 2016-05-31: 1000 mL via INTRAVENOUS

## 2016-05-31 MED ORDER — ACETAMINOPHEN 500 MG PO TABS
1000.0000 mg | ORAL_TABLET | Freq: Once | ORAL | Status: AC
  Administered 2016-05-31: 1000 mg via ORAL
  Filled 2016-05-31: qty 2

## 2016-05-31 NOTE — ED Notes (Signed)
Reported elevated lactic to Dr Mable Paris

## 2016-05-31 NOTE — ED Triage Notes (Signed)
Pt arrived via ems from [facility] for c/o altered mental status - pt has hx of UTI - pt is febrile - pt had stent placed 3 weeks ago - lungs are clear to all lobes

## 2016-05-31 NOTE — ED Provider Notes (Signed)
[facility] Emergency Department Provider Note  ____________________________________________   First MD Initiated Contact with Patient 05/31/16 2158     (approximate)  I have reviewed the triage vital signs and the nursing notes.   HISTORY  Chief Complaint Altered Mental Status  History is limited by the patient's altered mental status  HPI Hailey [name] is a 83 y.o. female who comes to the emergency department from her nursing home for reported altered mental status. History is challenging to obtain as there is no one from the nursing home present and the patient is confused and unable to convey symptoms.   Past Medical History:  Diagnosis Date  . Anemia    Chronic  . Anxiety   . Arthritis   . Asthma   . Atrophic vaginitis   . Cerebral aneurysm    Hx of  . CHF (congestive heart failure) ([place])   . COPD (chronic obstructive pulmonary disease) ([place])   . Diverticulitis   . DVT (deep vein thrombosis) in pregnancy ([place]) 09/2008   a. LLE, recurrent hx, has IVC filter. Not on coumadin now with history of GI bleeding  . Fibrocystic disease of breast   . Fibromyalgia   . GERD (gastroesophageal reflux disease)   . Gout   . Gross hematuria   . Heart murmur   . History of colonoscopy   . Hyperlipidemia   . Hypertension   . Hypertrophic cardiomyopathy ([place])    a. Echo 4/09 with EF 70-75%, asymmetricy basal septal hypertrophy, mild MR without systolic anterior motion of the mitral valve, LVOT gradient to 130 mmHg with Valsalva b. Echo 7/13: EF 60-65%, mild focal basal septal hypertrophy, no significant LVOT gradient, no MV SAM c. Echo 2/15: EF 55-60%, HOCM, resting LVOT gradient 29 mmHg, Valsalva LVOT gradient > 140 mmHg, mild LVH, mild TR, elevated PASP  . Incomplete bladder emptying   . Migraine headache    Hx of  . Obesity   . OSA (obstructive sleep apnea)    a. Intolerant to CPAP, wears 2L via n/c  . Osteoarthritis    Hx of left TKR  . PAF  (paroxysmal atrial fibrillation) ([place])    a. Full-dose ASA alone 2/2 h/o GIB.  Marland Kitchen PAT (paroxysmal atrial tachycardia) ([place])   . 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 Pneumonia   . PUD (peptic ulcer disease)    With GI bleeding  . Pulmonary embolism ([place])   . Tremor     Patient Active Problem List   Diagnosis Date Noted  . Sepsis secondary to UTI ([place]) 06/01/2016  . Acute ST elevation myocardial infarction (STEMI) involving left anterior descending (LAD) coronary artery ([place]) 05/01/2016  . STEMI (ST elevation myocardial infarction) ([place]) 05/01/2016  . Protein-calorie malnutrition, severe 10/23/2015  . Uncontrollable vomiting   . Nausea and vomiting 10/22/2015  . Tremor 07/07/2015  . Respiratory failure ([place]) 05/19/2015  . Weakness 01/27/2015  . Abdomen enlarged 09/26/2013  . CTCL (cutaneous T-cell lymphoma) ([place]) 09/18/2013  . Anxiety 08/09/2013  . CCF (congestive cardiac failure) ([place]) 08/09/2013  . Chronic pain 08/09/2013  . Chronic diastolic CHF (congestive heart failure) ([place]) 07/12/2013  . Benign essential HTN 07/01/2013  . Bilateral leg edema 06/06/2013  . PAF (paroxysmal atrial fibrillation) ([place]) 04/25/2013  . DOE (dyspnea on exertion) 10/11/2011  . COPD (chronic obstructive pulmonary disease) ([place]) 10/09/2011  . Cognitive decline 10/09/2011  . Behavior concern 08/23/2011  . Chest pain 10/26/2010  . Palpitations 10/26/2010  .  PHLEBITIS AND THROMBOPHLEBITIS OF FEMORAL VEIN 10/13/2008  . DEEP VENOUS THROMBOPHLEBITIS, LEG, LEFT 10/09/2008  . GASTRIC ULCER, ACUTE 10/09/2008  . PERSONAL HX COLONIC POLYPS 09/02/2008  . FATIGUE 08/27/2008  . HOCM (hypertrophic obstructive cardiomyopathy) ([place]) 11/06/2007  . HYPOTENSION, ORTHOSTATIC 11/06/2007  . ADENOMATOUS COLONIC POLYP 04/12/2007  . HIATAL HERNIA 04/12/2007  . Diverticulosis of colon (without mention of hemorrhage) 04/12/2007  . Hyperlipidemia 02/03/2007  . ANEMIA, CHRONIC  02/03/2007  . MIGRAINE HEADACHE 02/03/2007  . Essential hypertension 02/03/2007  . CEREBRAL ANEURYSM 02/03/2007  . GASTROESOPHAGEAL REFLUX DISEASE 02/03/2007  . OSTEOARTHRITIS 02/03/2007  . FIBROMYALGIA 02/03/2007  . CHRONIC FATIGUE SYNDROME 02/03/2007  . MITRAL VALVE PROLAPSE, HX OF 02/03/2007  . PULMONARY EMBOLISM, HX OF 02/03/2007  . TOTAL KNEE REPLACEMENT, LEFT, HX OF 02/03/2007  . HYSTERECTOMY, HX OF 02/03/2007  . Other acquired absence of organ 02/03/2007  . INGUINAL HERNIORRHAPHY, RIGHT, HX OF 02/03/2007    Past Surgical History:  Procedure Laterality Date  . ABDOMINAL HYSTERECTOMY    . APPENDECTOMY    . BLADDER SUSPENSION    . CARDIAC CATHETERIZATION  2009   No significant CAD  . CARDIOVASCULAR STRESS TEST     a. Lexiscan Myoview 3/14: EF 76%, no evidence of ischemia or WMAs  . cataract surgery    . CEREBRAL ANEURYSM REPAIR  1998  . CORONARY STENT INTERVENTION N/A 05/01/2016   Procedure: Coronary Stent Intervention;  Surgeon: Yolonda Kida, MD;  Location: [facility];  Service: Cardiovascular;  Laterality: N/A;  . ESOPHAGOGASTRODUODENOSCOPY (EGD) WITH PROPOFOL N/A 11/24/2015   Procedure: ESOPHAGOGASTRODUODENOSCOPY (EGD) WITH PROPOFOL;  Surgeon: Lollie Sails, MD;  Location: [facility] ENDOSCOPY;  Service: Endoscopy;  Laterality: N/A;  . FOOT SURGERY     Bilateral  . INGUINAL HERNIA REPAIR  1968   Right  . KNEE ARTHROSCOPY  2009   Right  . LEFT HEART CATH AND CORONARY ANGIOGRAPHY N/A 05/01/2016   Procedure: Left Heart Cath and Coronary Angiography;  Surgeon: Yolonda Kida, MD;  Location: [place] [facility];  Service: Cardiovascular;  Laterality: N/A;  . SHOULDER SURGERY  1972   Right   Left 2012  . TONSILLECTOMY AND ADENOIDECTOMY    . TOTAL KNEE ARTHROPLASTY     Left    Prior to Admission medications   Medication Sig Start Date End Date Taking? Authorizing Provider  acetaminophen (TYLENOL) 325 MG tablet Take 2 tablets (650 mg total) by mouth every  6 (six) hours as needed for mild pain or moderate pain (temp > 101.5). 05/25/15  Yes Nicholes Mango, MD  albuterol (PROVENTIL HFA;VENTOLIN HFA) 108 (90 Base) MCG/ACT inhaler Inhale 1 puff into the lungs every 6 (six) hours as needed for wheezing or shortness of breath.   Yes Historical Provider, MD  albuterol (PROVENTIL) (2.5 MG/3ML) 0.083% nebulizer solution Take 3 mLs (2.5 mg total) by nebulization every 4 (four) hours as needed for wheezing or shortness of breath. 05/25/15  Yes Nicholes Mango, MD  amiodarone (PACERONE) 200 MG tablet Take 1 tablet (200 mg total) by mouth daily. Pt is able to take an additional tablet if needed for breakthrough arrhythmias. 08/04/15  Yes Minna Merritts, MD  aspirin EC 81 MG EC tablet Take 1 tablet (81 mg total) by mouth daily. 05/06/16  Yes Vipul Manuella Ghazi, MD  atorvastatin (LIPITOR) 80 MG tablet Take 1 tablet (80 mg total) by mouth daily at 6 PM. 05/06/16  Yes Vipul Manuella Ghazi, MD  carbidopa-levodopa (SINEMET IR) 25-250 MG tablet Take 1 tablet by  mouth 4 (four) times daily. 05/06/16  Yes Vipul Manuella Ghazi, MD  chlorhexidine (PERIDEX) 0.12 % solution 15 mLs by Mouth Rinse route 2 (two) times daily. 04/14/16  Yes Historical Provider, MD  diazepam (VALIUM) 2 MG tablet Take 1 tablet (2 mg total) by mouth every 6 (six) hours as needed for anxiety (or back pain). 05/06/16  Yes Vipul Manuella Ghazi, MD  dimenhyDRINATE (DRAMAMINE) 50 MG tablet Take 25-50 mg by mouth every 6 (six) hours as needed for dizziness.    Yes Historical Provider, MD  docusate sodium (COLACE) 100 MG capsule Take 100 mg by mouth 2 (two) times daily as needed for mild constipation.   Yes Historical Provider, MD  DULoxetine (CYMBALTA) 30 MG capsule Take 3 capsules (90 mg total) by mouth daily. 05/07/16  Yes Vipul Manuella Ghazi, MD  feeding supplement, ENSURE ENLIVE, (ENSURE ENLIVE) LIQD Take 237 mLs by mouth 2 (two) times daily between meals. 01/28/15  Yes Sital Mody, MD  fluticasone (FLONASE) 50 MCG/ACT nasal spray Place 2 sprays into both nostrils daily  as needed for rhinitis.   Yes Historical Provider, MD  gabapentin (NEURONTIN) 100 MG capsule Take 200-400 mg by mouth 3 (three) times daily. Pt takes three capsules in the morning, two capsules in the evening, and four capsules at bedtime.   Yes Historical Provider, MD  isosorbide mononitrate (IMDUR) 30 MG 24 hr tablet Take 1 tablet (30 mg total) by mouth daily. 05/06/16  Yes Max Sane, MD  Levothyroxine Sodium 75 MCG CAPS Take 50 mcg by mouth daily before breakfast.    Yes Historical Provider, MD  losartan (COZAAR) 25 MG tablet Take 25 mg by mouth daily. 11/27/15  Yes Historical Provider, MD  mometasone-formoterol (DULERA) 100-5 MCG/ACT AERO Inhale 2 puffs into the lungs 2 (two) times daily. 05/25/15  Yes Nicholes Mango, MD  montelukast (SINGULAIR) 10 MG tablet Take 10 mg by mouth at bedtime.   Yes Historical Provider, MD  ondansetron (ZOFRAN-ODT) 4 MG disintegrating tablet Take 4 mg by mouth every 8 (eight) hours as needed for nausea or vomiting.   Yes Historical Provider, MD  oxyCODONE (OXY IR/ROXICODONE) 5 MG immediate release tablet Take 1 tablet (5 mg total) by mouth every 6 (six) hours as needed for moderate pain or severe pain. 05/06/16  Yes Vipul Manuella Ghazi, MD  pantoprazole (PROTONIX) 40 MG tablet Take 40 mg by mouth daily.   Yes Historical Provider, MD  polyethylene glycol (MIRALAX / GLYCOLAX) packet Take 17 g by mouth daily as needed for moderate constipation.    Yes Historical Provider, MD  potassium chloride (K-DUR,KLOR-CON) 10 MEQ tablet Take 1 tablet (10 mEq total) by mouth 3 (three) times daily. 05/27/16  Yes Minna Merritts, MD  senna (SENOKOT) 8.6 MG TABS tablet Take 2 tablets (17.2 mg total) by mouth 2 (two) times daily. 05/25/15  Yes Nicholes Mango, MD  sucralfate (CARAFATE) 1 g tablet Take 1 g by mouth 2 (two) times daily. 03/21/16  Yes Historical Provider, MD  ticagrelor (BRILINTA) 90 MG TABS tablet Take 1 tablet (90 mg total) by mouth 2 (two) times daily. 05/06/16  Yes Vipul Manuella Ghazi, MD  tiotropium  (SPIRIVA) 18 MCG inhalation capsule Place 18 mcg into inhaler and inhale daily.   Yes Historical Provider, MD  torsemide (DEMADEX) 20 MG tablet Take 1 tablet (20 mg total) by mouth 2 (two) times daily. 10/24/15  Yes Gladstone Lighter, MD  triamcinolone ointment (KENALOG) 0.1 % Apply 1 application topically 2 (two) times daily as needed (for rash).  Yes Historical Provider, MD    Allergies Aspirin; Codeine; Compazine [prochlorperazine]; Ketorolac tromethamine; Nsaids; Phenazopyridine hcl; and Macrodantin [nitrofurantoin macrocrystal]  Family History  Problem Relation Age of Onset  . Pancreatic cancer Mother   . Hypertension Mother     father  . Diabetes Mellitus II Mother     Sister  . Colon cancer Father   . Colon polyps Father   . Heart disease Father   . Stroke Father   . Heart disease Sister     More than 1 sister  . Colon cancer Brother   . Colon polyps Other     Siblings  . Ulcers Brother     Social History Social History  Substance Use Topics  . Smoking status: Former Smoker    Packs/day: 1.00    Years: 60.00    Types: Cigarettes    Quit date: 12/15/2008  . Smokeless tobacco: Never Used     Comment: smoked for 50 years quit 5 + years  . Alcohol use 0.0 oz/week     Comment: Socially    Review of Systems Level V exemption history is limited by the patient's clinical condition 10-point ROS otherwise negative.  ____________________________________________   PHYSICAL EXAM:  VITAL SIGNS: ED Triage Vitals  Enc Vitals Group     BP 05/31/16 2146 107/64     Pulse Rate 05/31/16 2146 80     Resp 05/31/16 2146 (!) 22     Temp 05/31/16 2146 (!) 101.6 F (38.7 C)     Temp Source 05/31/16 2146 Oral     SpO2 05/31/16 2146 97 %     Weight 05/31/16 2145 194 lb 0.1 oz (88 kg)     Height 05/31/16 2145 5\' 2"  (1.575 m)     Head Circumference --      Peak Flow --      Pain Score --      Pain Loc --      Pain Edu? --      Excl. in [place]? --     Constitutional: Obtunded  lethargic confused and moaning Eyes: PERRL EOMI. Head: Atraumatic. Nose: No congestion/rhinnorhea. Mouth/Throat: No trismus Neck: No stridor.   Cardiovascular: Normal rate, regular rhythm. Grossly normal heart sounds.  Good peripheral circulation. Respiratory: Normal respiratory effort.  No retractions. Lungs CTAB and moving good air Gastrointestinal: Soft nondistended mild lower abdominal tenderness without rebound or guarding Musculoskeletal: No lower extremity edema   Neurologic:   No gross focal neurologic deficits are appreciated. Skin:  Skin is warm, dry and intact. No rash noted. Psychiatric: Profoundly confused    ____________________________________________   DIFFERENTIAL  Sepsis, urinary tract infection, pneumonia, influenza, pyelonephritis, bacteremia ____________________________________________   LABS (all labs ordered are listed, but only abnormal results are displayed)  Labs Reviewed  BLOOD CULTURE ID PANEL (REFLEXED) - Abnormal; Notable for the following:       Result Value   Enterobacteriaceae species DETECTED (*)    Escherichia coli DETECTED (*)    All other components within normal limits  COMPREHENSIVE METABOLIC PANEL - Abnormal; Notable for the following:    Sodium 132 (*)    Potassium 2.8 (*)    Chloride 99 (*)    CO2 21 (*)    Glucose, Bld 137 (*)    Total Protein 6.3 (*)    AST 64 (*)    Total Bilirubin 1.3 (*)    All other components within normal limits  CBC - Abnormal; Notable for the following:    WBC  12.4 (*)    RBC 3.68 (*)    Hemoglobin 11.6 (*)    HCT 33.7 (*)    RDW 14.8 (*)    Platelets 137 (*)    All other components within normal limits  TROPONIN I - Abnormal; Notable for the following:    Troponin I 0.05 (*)    All other components within normal limits  LACTIC ACID, PLASMA - Abnormal; Notable for the following:    Lactic Acid, Venous 3.0 (*)    All other components within normal limits  LACTIC ACID, PLASMA - Abnormal; Notable  for the following:    Lactic Acid, Venous 2.3 (*)    All other components within normal limits  URINALYSIS, ROUTINE W REFLEX MICROSCOPIC - Abnormal; Notable for the following:    Color, Urine AMBER (*)    APPearance CLOUDY (*)    Hgb urine dipstick MODERATE (*)    Ketones, ur 5 (*)    Protein, ur 100 (*)    Nitrite POSITIVE (*)    Leukocytes, UA SMALL (*)    Bacteria, UA MANY (*)    All other components within normal limits  LIPASE, BLOOD - Abnormal; Notable for the following:    Lipase 10 (*)    All other components within normal limits  PROTIME-INR - Abnormal; Notable for the following:    Prothrombin Time 15.6 (*)    All other components within normal limits  TROPONIN I - Abnormal; Notable for the following:    Troponin I 0.07 (*)    All other components within normal limits  BASIC METABOLIC PANEL - Abnormal; Notable for the following:    Sodium 134 (*)    Potassium 3.1 (*)    CO2 21 (*)    Glucose, Bld 150 (*)    GFR calc non Af Amer 57 (*)    All other components within normal limits  CBC - Abnormal; Notable for the following:    WBC 14.9 (*)    RBC 3.65 (*)    Hemoglobin 11.2 (*)    HCT 33.5 (*)    RDW 14.8 (*)    Platelets 119 (*)    All other components within normal limits  LACTIC ACID, PLASMA - Abnormal; Notable for the following:    Lactic Acid, Venous 5.4 (*)    All other components within normal limits  TROPONIN I - Abnormal; Notable for the following:    Troponin I 0.06 (*)    All other components within normal limits  CULTURE, BLOOD (ROUTINE X 2)  CULTURE, BLOOD (ROUTINE X 2)  MRSA PCR SCREENING  URINE CULTURE  PROCALCITONIN  INFLUENZA PANEL BY PCR (TYPE A & B)  MAGNESIUM  PROCALCITONIN  PHOSPHORUS  PROTIME-INR  APTT  CBG MONITORING, ED    Urinalysis consistent with infection with elevated troponin suggestive of demand ischemia elevated white count improved calcitonin are both consistent with bacterial  infection __________________________________________  EKG  ED ECG REPORT I, Darel Hong, the attending physician, personally viewed and interpreted this ECG.  Date: 06/01/2016  Rate: 64 Rhythm: normal sinus rhythm QRS Axis: normal Intervals: First-degree AV block prolonged QTC ST/T Wave abnormalities: Nonspecific lateral ST changes Conduction Disturbances: none Narrative Interpretation: Abnormal  ____________________________________________  RADIOLOGY  Chest x-ray with no evidence of pneumonia ____________________________________________   PROCEDURES  Procedure(s) performed: no  Procedures  Critical Care performed: yes  CRITICAL CARE Performed by: Darel Hong   Total critical care time: 35 minutes  Critical care time was exclusive of separately  billable procedures and treating other patients.  Critical care was necessary to treat or prevent imminent or life-threatening deterioration.  Critical care was time spent personally by me on the following activities: development of treatment plan with patient and/or surrogate as well as nursing, discussions with consultants, evaluation of patient's response to treatment, examination of patient, obtaining history from patient or surrogate, ordering and performing treatments and interventions, ordering and review of laboratory studies, ordering and review of radiographic studies, pulse oximetry and re-evaluation of patient's condition.   ____________________________________________   INITIAL IMPRESSION / ASSESSMENT AND PLAN / ED COURSE  Pertinent labs & imaging results that were available during my care of the patient were reviewed by me and considered in my medical decision making (see chart for details).  On arrival the patient is febrile and profoundly altered concerning for sepsis. In and out urinalysis is consistent with infection. Given SIRS pluses source I will treat her with 30 cc/kg of IV fluids as well as  brought IV antibiotics. I'm worried about her so I will give her a dose of ceftriaxone as well as gentamicin for dual coverage. At this point and she is septic she requires inpatient admission for close observation.      ____________________________________________   FINAL CLINICAL IMPRESSION(S) / ED DIAGNOSES  Final diagnoses:  Altered mental status, unspecified altered mental status type  Urinary tract infection with hematuria, site unspecified      NEW MEDICATIONS STARTED DURING THIS VISIT:  Current Discharge Medication List       Note:  This document was prepared using Dragon voice recognition software and may include unintentional dictation errors.     Darel Hong, MD 06/01/16 [phone]

## 2016-05-31 NOTE — ED Notes (Signed)
Pt arrived via ems from [facility] for c/o altered mental status - pt has hx of UTI - pt is febrile - pt had stent placed 3 weeks ago - lungs are clear to all lobes

## 2016-05-31 NOTE — ED Notes (Signed)
Dr Mable Paris notified of elevated troponin

## 2016-05-31 NOTE — ED Notes (Signed)
atb not in pyxis - pharmacy notified and will be sending it

## 2016-06-01 ENCOUNTER — Inpatient Hospital Stay: Payer: Medicare HMO

## 2016-06-01 DIAGNOSIS — I422 Other hypertrophic cardiomyopathy: Secondary | ICD-10-CM | POA: Diagnosis present

## 2016-06-01 DIAGNOSIS — K5792 Diverticulitis of intestine, part unspecified, without perforation or abscess without bleeding: Secondary | ICD-10-CM | POA: Diagnosis present

## 2016-06-01 DIAGNOSIS — I480 Paroxysmal atrial fibrillation: Secondary | ICD-10-CM | POA: Diagnosis present

## 2016-06-01 DIAGNOSIS — I5032 Chronic diastolic (congestive) heart failure: Secondary | ICD-10-CM | POA: Diagnosis present

## 2016-06-01 DIAGNOSIS — G20 Parkinson's disease: Secondary | ICD-10-CM | POA: Diagnosis present

## 2016-06-01 DIAGNOSIS — I2699 Other pulmonary embolism without acute cor pulmonale: Secondary | ICD-10-CM | POA: Diagnosis present

## 2016-06-01 DIAGNOSIS — M549 Dorsalgia, unspecified: Secondary | ICD-10-CM | POA: Diagnosis present

## 2016-06-01 DIAGNOSIS — E871 Hypo-osmolality and hyponatremia: Secondary | ICD-10-CM | POA: Diagnosis present

## 2016-06-01 DIAGNOSIS — C84A0 Cutaneous T-cell lymphoma, unspecified, unspecified site: Secondary | ICD-10-CM | POA: Diagnosis present

## 2016-06-01 DIAGNOSIS — F329 Major depressive disorder, single episode, unspecified: Secondary | ICD-10-CM | POA: Diagnosis present

## 2016-06-01 DIAGNOSIS — Z86718 Personal history of other venous thrombosis and embolism: Secondary | ICD-10-CM | POA: Diagnosis not present

## 2016-06-01 DIAGNOSIS — A4151 Sepsis due to Escherichia coli [E. coli]: Secondary | ICD-10-CM | POA: Diagnosis present

## 2016-06-01 DIAGNOSIS — I110 Hypertensive heart disease with heart failure: Secondary | ICD-10-CM | POA: Diagnosis present

## 2016-06-01 DIAGNOSIS — F419 Anxiety disorder, unspecified: Secondary | ICD-10-CM | POA: Diagnosis present

## 2016-06-01 DIAGNOSIS — G8929 Other chronic pain: Secondary | ICD-10-CM | POA: Diagnosis present

## 2016-06-01 DIAGNOSIS — N390 Urinary tract infection, site not specified: Secondary | ICD-10-CM

## 2016-06-01 DIAGNOSIS — D638 Anemia in other chronic diseases classified elsewhere: Secondary | ICD-10-CM | POA: Diagnosis present

## 2016-06-01 DIAGNOSIS — J449 Chronic obstructive pulmonary disease, unspecified: Secondary | ICD-10-CM | POA: Diagnosis present

## 2016-06-01 DIAGNOSIS — R310 Gross hematuria: Secondary | ICD-10-CM | POA: Diagnosis present

## 2016-06-01 DIAGNOSIS — A419 Sepsis, unspecified organism: Secondary | ICD-10-CM | POA: Diagnosis present

## 2016-06-01 DIAGNOSIS — I248 Other forms of acute ischemic heart disease: Secondary | ICD-10-CM | POA: Diagnosis present

## 2016-06-01 DIAGNOSIS — Z66 Do not resuscitate: Secondary | ICD-10-CM | POA: Diagnosis present

## 2016-06-01 DIAGNOSIS — E785 Hyperlipidemia, unspecified: Secondary | ICD-10-CM | POA: Diagnosis present

## 2016-06-01 DIAGNOSIS — E876 Hypokalemia: Secondary | ICD-10-CM | POA: Diagnosis present

## 2016-06-01 DIAGNOSIS — I471 Supraventricular tachycardia: Secondary | ICD-10-CM | POA: Diagnosis present

## 2016-06-01 LAB — MRSA PCR SCREENING: MRSA BY PCR: NEGATIVE

## 2016-06-01 LAB — BLOOD CULTURE ID PANEL (REFLEXED)
Acinetobacter baumannii: NOT DETECTED
CANDIDA PARAPSILOSIS: NOT DETECTED
CARBAPENEM RESISTANCE: NOT DETECTED
Candida albicans: NOT DETECTED
Candida glabrata: NOT DETECTED
Candida krusei: NOT DETECTED
Candida tropicalis: NOT DETECTED
ENTEROBACTERIACEAE SPECIES: DETECTED — AB
ENTEROCOCCUS SPECIES: NOT DETECTED
Enterobacter cloacae complex: NOT DETECTED
Escherichia coli: DETECTED — AB
HAEMOPHILUS INFLUENZAE: NOT DETECTED
Klebsiella oxytoca: NOT DETECTED
Klebsiella pneumoniae: NOT DETECTED
LISTERIA MONOCYTOGENES: NOT DETECTED
Neisseria meningitidis: NOT DETECTED
PSEUDOMONAS AERUGINOSA: NOT DETECTED
Proteus species: NOT DETECTED
STAPHYLOCOCCUS AUREUS BCID: NOT DETECTED
STREPTOCOCCUS PYOGENES: NOT DETECTED
STREPTOCOCCUS SPECIES: NOT DETECTED
Serratia marcescens: NOT DETECTED
Staphylococcus species: NOT DETECTED
Streptococcus agalactiae: NOT DETECTED
Streptococcus pneumoniae: NOT DETECTED

## 2016-06-01 LAB — PROTIME-INR
INR: 1.18
PROTHROMBIN TIME: 15.1 s (ref 11.4–15.2)

## 2016-06-01 LAB — PROCALCITONIN: Procalcitonin: 6.46 ng/mL

## 2016-06-01 LAB — CBC
HCT: 33.5 % — ABNORMAL LOW (ref 35.0–47.0)
Hemoglobin: 11.2 g/dL — ABNORMAL LOW (ref 12.0–16.0)
MCH: 30.6 pg (ref 26.0–34.0)
MCHC: 33.4 g/dL (ref 32.0–36.0)
MCV: 91.7 fL (ref 80.0–100.0)
PLATELETS: 119 10*3/uL — AB (ref 150–440)
RBC: 3.65 MIL/uL — ABNORMAL LOW (ref 3.80–5.20)
RDW: 14.8 % — ABNORMAL HIGH (ref 11.5–14.5)
WBC: 14.9 10*3/uL — AB (ref 3.6–11.0)

## 2016-06-01 LAB — BASIC METABOLIC PANEL
Anion gap: 12 (ref 5–15)
BUN: 14 mg/dL (ref 6–20)
CALCIUM: 9.1 mg/dL (ref 8.9–10.3)
CO2: 21 mmol/L — ABNORMAL LOW (ref 22–32)
CREATININE: 0.92 mg/dL (ref 0.44–1.00)
Chloride: 101 mmol/L (ref 101–111)
GFR calc Af Amer: >60 mL/min (ref >60)
GFR, EST NON AFRICAN AMERICAN: 57 mL/min — AB (ref >60)
Glucose, Bld: 150 mg/dL — ABNORMAL HIGH (ref 65–99)
Potassium: 3.1 mmol/L — ABNORMAL LOW (ref 3.5–5.1)
SODIUM: 134 mmol/L — AB (ref 135–145)

## 2016-06-01 LAB — LACTIC ACID, PLASMA
Lactic Acid, Venous: 2.3 mmol/L (ref 0.5–1.9)
Lactic Acid, Venous: 5.4 mmol/L (ref 0.5–1.9)
Lactic Acid, Venous: 1.9 mmol/L (ref 0.5–1.9)

## 2016-06-01 LAB — APTT: APTT: 36 s (ref 24–36)

## 2016-06-01 LAB — TROPONIN I
TROPONIN I: 0.07 ng/mL — AB (ref <0.03)
Troponin I: 0.06 ng/mL (ref <0.03)

## 2016-06-01 LAB — PHOSPHORUS: Phosphorus: 2.7 mg/dL (ref 2.5–4.6)

## 2016-06-01 LAB — MAGNESIUM: Magnesium: 2 mg/dL (ref 1.7–2.4)

## 2016-06-01 MED ORDER — DIAZEPAM 2 MG PO TABS
2.0000 mg | ORAL_TABLET | Freq: Four times a day (QID) | ORAL | Status: DC | PRN
  Administered 2016-06-01: 2 mg via ORAL
  Filled 2016-06-01: qty 1

## 2016-06-01 MED ORDER — DOCUSATE SODIUM 100 MG PO CAPS: 100.0000 mg | ORAL_CAPSULE | Freq: Two times a day (BID) | ORAL | Status: DC | PRN

## 2016-06-01 MED ORDER — ACETAMINOPHEN 650 MG RE SUPP
650.0000 mg | RECTAL | Status: DC | PRN
  Administered 2016-06-01: 650 mg via RECTAL

## 2016-06-01 MED ORDER — MONTELUKAST SODIUM 10 MG PO TABS
10.0000 mg | ORAL_TABLET | Freq: Every day | ORAL | Status: DC
  Administered 2016-06-01 – 2016-06-02 (×2): 10 mg via ORAL
  Filled 2016-06-01 (×2): qty 1

## 2016-06-01 MED ORDER — POLYETHYLENE GLYCOL 3350 17 G PO PACK
17.0000 g | PACK | Freq: Every day | ORAL | Status: DC | PRN
  Administered 2016-06-03: 17 g via ORAL
  Filled 2016-06-01: qty 1

## 2016-06-01 MED ORDER — POTASSIUM CHLORIDE CRYS ER 10 MEQ PO TBCR
10.0000 meq | EXTENDED_RELEASE_TABLET | Freq: Three times a day (TID) | ORAL | Status: DC
  Administered 2016-06-01 – 2016-06-03 (×7): 10 meq via ORAL
  Filled 2016-06-01 (×7): qty 1

## 2016-06-01 MED ORDER — FLUTICASONE PROPIONATE 50 MCG/ACT NA SUSP
2.0000 | Freq: Every day | NASAL | Status: DC | PRN
  Filled 2016-06-01: qty 16

## 2016-06-01 MED ORDER — MEROPENEM-SODIUM CHLORIDE 1 GM/50ML IV SOLR
1.0000 g | Freq: Two times a day (BID) | INTRAVENOUS | Status: DC
  Administered 2016-06-01 – 2016-06-02 (×4): 1 g via INTRAVENOUS
  Filled 2016-06-01 (×6): qty 50

## 2016-06-01 MED ORDER — LOSARTAN POTASSIUM 25 MG PO TABS
25.0000 mg | ORAL_TABLET | Freq: Every day | ORAL | Status: DC
  Administered 2016-06-01 – 2016-06-03 (×3): 25 mg via ORAL
  Filled 2016-06-01 (×3): qty 1

## 2016-06-01 MED ORDER — SODIUM CHLORIDE 0.9 % IV SOLN
INTRAVENOUS | Status: DC
  Administered 2016-06-01 (×2): via INTRAVENOUS

## 2016-06-01 MED ORDER — ASPIRIN EC 81 MG PO TBEC
81.0000 mg | DELAYED_RELEASE_TABLET | Freq: Every day | ORAL | Status: DC
  Administered 2016-06-01 – 2016-06-03 (×3): 81 mg via ORAL
  Filled 2016-06-01 (×3): qty 1

## 2016-06-01 MED ORDER — SODIUM CHLORIDE 0.9 % IV SOLN
1.0000 g | Freq: Two times a day (BID) | INTRAVENOUS | Status: DC
  Filled 2016-06-01 (×2): qty 1

## 2016-06-01 MED ORDER — IPRATROPIUM BROMIDE 0.02 % IN SOLN: 0.5000 mg | Freq: Four times a day (QID) | RESPIRATORY_TRACT | Status: DC | PRN

## 2016-06-01 MED ORDER — DULOXETINE HCL 30 MG PO CPEP
90.0000 mg | ORAL_CAPSULE | Freq: Every day | ORAL | Status: DC
  Administered 2016-06-01 – 2016-06-03 (×3): 90 mg via ORAL
  Filled 2016-06-01 (×3): qty 3

## 2016-06-01 MED ORDER — ALBUTEROL SULFATE HFA 108 (90 BASE) MCG/ACT IN AERS: 1.0000 | INHALATION_SPRAY | Freq: Four times a day (QID) | RESPIRATORY_TRACT | Status: DC | PRN

## 2016-06-01 MED ORDER — PANTOPRAZOLE SODIUM 40 MG PO TBEC
40.0000 mg | DELAYED_RELEASE_TABLET | Freq: Every day | ORAL | Status: DC
  Administered 2016-06-01 – 2016-06-03 (×3): 40 mg via ORAL
  Filled 2016-06-01 (×3): qty 1

## 2016-06-01 MED ORDER — MOMETASONE FURO-FORMOTEROL FUM 100-5 MCG/ACT IN AERO
2.0000 | INHALATION_SPRAY | Freq: Two times a day (BID) | RESPIRATORY_TRACT | Status: DC
Start: 2016-06-01 — End: 2016-06-03
  Administered 2016-06-01 – 2016-06-03 (×6): 2 via RESPIRATORY_TRACT
  Filled 2016-06-01: qty 8.8

## 2016-06-01 MED ORDER — GABAPENTIN 100 MG PO CAPS
200.0000 mg | ORAL_CAPSULE | Freq: Every day | ORAL | Status: DC
  Administered 2016-06-01 – 2016-06-03 (×3): 200 mg via ORAL
  Filled 2016-06-01 (×3): qty 2

## 2016-06-01 MED ORDER — SUCRALFATE 1 G PO TABS
1.0000 g | ORAL_TABLET | Freq: Two times a day (BID) | ORAL | Status: DC
  Administered 2016-06-01 – 2016-06-03 (×6): 1 g via ORAL
  Filled 2016-06-01 (×6): qty 1

## 2016-06-01 MED ORDER — ENOXAPARIN SODIUM 40 MG/0.4ML ~~LOC~~ SOLN
40.0000 mg | SUBCUTANEOUS | Status: DC
  Administered 2016-06-01 – 2016-06-02 (×2): 40 mg via SUBCUTANEOUS
  Filled 2016-06-01 (×2): qty 0.4

## 2016-06-01 MED ORDER — ONDANSETRON 4 MG PO TBDP
4.0000 mg | ORAL_TABLET | Freq: Three times a day (TID) | ORAL | Status: DC | PRN
Start: 2016-06-01 — End: 2016-06-03
  Filled 2016-06-01: qty 1

## 2016-06-01 MED ORDER — ACETAMINOPHEN 325 MG PO TABS
650.0000 mg | ORAL_TABLET | Freq: Four times a day (QID) | ORAL | Status: DC | PRN
  Administered 2016-06-01 – 2016-06-03 (×3): 650 mg via ORAL
  Filled 2016-06-01 (×3): qty 2

## 2016-06-01 MED ORDER — ATORVASTATIN CALCIUM 20 MG PO TABS
80.0000 mg | ORAL_TABLET | Freq: Every day | ORAL | Status: DC
  Administered 2016-06-01 – 2016-06-03 (×3): 80 mg via ORAL
  Filled 2016-06-01 (×3): qty 4

## 2016-06-01 MED ORDER — TIOTROPIUM BROMIDE MONOHYDRATE 18 MCG IN CAPS
18.0000 ug | ORAL_CAPSULE | Freq: Every day | RESPIRATORY_TRACT | Status: DC
  Administered 2016-06-01 – 2016-06-03 (×3): 18 ug via RESPIRATORY_TRACT
  Filled 2016-06-01 (×2): qty 5

## 2016-06-01 MED ORDER — TICAGRELOR 90 MG PO TABS
90.0000 mg | ORAL_TABLET | Freq: Two times a day (BID) | ORAL | Status: DC
Start: 2016-06-01 — End: 2016-06-03
  Administered 2016-06-01 – 2016-06-03 (×6): 90 mg via ORAL
  Filled 2016-06-01 (×6): qty 1

## 2016-06-01 MED ORDER — OXYCODONE HCL 5 MG PO TABS
5.0000 mg | ORAL_TABLET | Freq: Four times a day (QID) | ORAL | Status: DC | PRN
  Administered 2016-06-01 – 2016-06-02 (×3): 5 mg via ORAL
  Filled 2016-06-01 (×3): qty 1

## 2016-06-01 MED ORDER — DIMENHYDRINATE 50 MG PO TABS: 25.0000 mg | ORAL_TABLET | Freq: Four times a day (QID) | ORAL | Status: DC | PRN

## 2016-06-01 MED ORDER — CHLORHEXIDINE GLUCONATE 0.12 % MT SOLN
15.0000 mL | Freq: Two times a day (BID) | OROMUCOSAL | Status: DC
  Administered 2016-06-01 – 2016-06-03 (×6): 15 mL via OROMUCOSAL
  Filled 2016-06-01 (×6): qty 15

## 2016-06-01 MED ORDER — DEXTROSE 5 % IV SOLN: 2.0000 g | INTRAVENOUS | Status: DC

## 2016-06-01 MED ORDER — LEVOTHYROXINE SODIUM 75 MCG PO CAPS: 50.0000 ug | ORAL_CAPSULE | Freq: Every day | ORAL | Status: DC

## 2016-06-01 MED ORDER — SODIUM CHLORIDE 0.9% FLUSH
3.0000 mL | Freq: Two times a day (BID) | INTRAVENOUS | Status: DC
  Administered 2016-06-01 – 2016-06-03 (×5): 3 mL via INTRAVENOUS

## 2016-06-01 MED ORDER — ISOSORBIDE MONONITRATE ER 30 MG PO TB24
30.0000 mg | ORAL_TABLET | Freq: Every day | ORAL | Status: DC
  Administered 2016-06-01 – 2016-06-03 (×3): 30 mg via ORAL
  Filled 2016-06-01 (×3): qty 1

## 2016-06-01 MED ORDER — POTASSIUM CHLORIDE CRYS ER 20 MEQ PO TBCR
40.0000 meq | EXTENDED_RELEASE_TABLET | Freq: Two times a day (BID) | ORAL | Status: AC
  Administered 2016-06-01 (×2): 40 meq via ORAL
  Filled 2016-06-01 (×2): qty 2

## 2016-06-01 MED ORDER — GABAPENTIN 100 MG PO CAPS: 200.0000 mg | ORAL_CAPSULE | Freq: Three times a day (TID) | ORAL | Status: DC

## 2016-06-01 MED ORDER — SENNA 8.6 MG PO TABS
2.0000 | ORAL_TABLET | Freq: Two times a day (BID) | ORAL | Status: DC
  Administered 2016-06-01 – 2016-06-03 (×6): 17.2 mg via ORAL
  Filled 2016-06-01 (×6): qty 2

## 2016-06-01 MED ORDER — AMIODARONE HCL 200 MG PO TABS
200.0000 mg | ORAL_TABLET | Freq: Every day | ORAL | Status: DC
  Administered 2016-06-01 – 2016-06-03 (×3): 200 mg via ORAL
  Filled 2016-06-01 (×3): qty 1

## 2016-06-01 MED ORDER — ALBUTEROL SULFATE (2.5 MG/3ML) 0.083% IN NEBU
2.5000 mg | INHALATION_SOLUTION | RESPIRATORY_TRACT | Status: DC | PRN
  Administered 2016-06-01: 2.5 mg via RESPIRATORY_TRACT
  Filled 2016-06-01: qty 3

## 2016-06-01 MED ORDER — ENSURE ENLIVE PO LIQD
237.0000 mL | Freq: Two times a day (BID) | ORAL | Status: DC
  Administered 2016-06-02 – 2016-06-03 (×4): 237 mL via ORAL

## 2016-06-01 MED ORDER — CEFTRIAXONE SODIUM 2 G IJ SOLR
2.0000 g | Freq: Once | INTRAMUSCULAR | Status: AC
  Administered 2016-06-01: 2 g via INTRAVENOUS
  Filled 2016-06-01: qty 2

## 2016-06-01 MED ORDER — LEVOTHYROXINE SODIUM 50 MCG PO TABS
50.0000 ug | ORAL_TABLET | Freq: Every day | ORAL | Status: DC
  Administered 2016-06-01 – 2016-06-03 (×3): 50 ug via ORAL
  Filled 2016-06-01 (×3): qty 1

## 2016-06-01 MED ORDER — TORSEMIDE 20 MG PO TABS
20.0000 mg | ORAL_TABLET | Freq: Two times a day (BID) | ORAL | Status: DC
  Administered 2016-06-01 – 2016-06-03 (×6): 20 mg via ORAL
  Filled 2016-06-01 (×6): qty 1

## 2016-06-01 MED ORDER — GABAPENTIN 400 MG PO CAPS
400.0000 mg | ORAL_CAPSULE | Freq: Every day | ORAL | Status: DC
  Administered 2016-06-01 – 2016-06-02 (×2): 400 mg via ORAL
  Filled 2016-06-01 (×2): qty 1

## 2016-06-01 MED ORDER — GABAPENTIN 300 MG PO CAPS
300.0000 mg | ORAL_CAPSULE | Freq: Every morning | ORAL | Status: DC
  Administered 2016-06-01 – 2016-06-03 (×3): 300 mg via ORAL
  Filled 2016-06-01 (×3): qty 1

## 2016-06-01 MED ORDER — CARBIDOPA-LEVODOPA 25-250 MG PO TABS
1.0000 | ORAL_TABLET | Freq: Once | ORAL | Status: AC
  Administered 2016-06-01: 1 via ORAL
  Filled 2016-06-01: qty 1

## 2016-06-01 MED ORDER — CARBIDOPA-LEVODOPA 25-250 MG PO TABS
1.0000 | ORAL_TABLET | Freq: Four times a day (QID) | ORAL | Status: DC
  Administered 2016-06-01 – 2016-06-03 (×11): 1 via ORAL
  Filled 2016-06-01 (×13): qty 1

## 2016-06-01 NOTE — Progress Notes (Signed)
Pt arrived from ED alert and oriented, able to answer admission questions. Skin and Telemetry verified with Cleophas Dunker. No c/o pain. No SOB. VSS and WNL. Fall contract signed, bed controls and phone explained. Pt arrived from ED a full code but pt states she is supposed to be DNR. Consult to chaplain placed, MD made aware. DNR ordered by MD Pyreddy. Pt asked again what were her wishes and she stated "yes I am a Do Not Resuscitate".

## 2016-06-01 NOTE — Progress Notes (Signed)
[facility] NAME: Hailey [name]    MR#:  [id number]  DATE OF BIRTH:  April 24, 1932  SUBJECTIVE:   Came with weakness, vomiting and nausea REVIEW OF SYSTEMS:   Review of Systems  Constitutional: Positive for chills. Negative for fever and weight loss.  HENT: Negative for ear discharge, ear pain and nosebleeds.   Eyes: Negative for blurred vision, pain and discharge.  Respiratory: Negative for sputum production, shortness of breath, wheezing and stridor.   Cardiovascular: Negative for chest pain, palpitations, orthopnea and PND.  Gastrointestinal: Negative for abdominal pain, diarrhea, nausea and vomiting.  Genitourinary: Positive for dysuria. Negative for frequency and urgency.  Musculoskeletal: Negative for back pain and joint pain.  Neurological: Positive for weakness. Negative for sensory change, speech change and focal weakness.  Psychiatric/Behavioral: Negative for depression and hallucinations. The patient is not nervous/anxious.    Tolerating Diet:yes  Tolerating PT: pending  DRUG ALLERGIES:   Allergies  Allergen Reactions  . Aspirin Other (See Comments)    Reaction:  GI bleeding   . Codeine Nausea And Vomiting  . Compazine [Prochlorperazine] Other (See Comments)    Confusion  . Ketorolac Tromethamine Other (See Comments)    Reaction:  Headache   . Nsaids Other (See Comments)    Reaction:  GI bleeding   . Phenazopyridine Hcl Other (See Comments)    Reaction:  Vision problems   . Macrodantin [Nitrofurantoin Macrocrystal] Rash    VITALS:  Blood pressure (!) 140/91, pulse 85, temperature (!) 100.6 F (38.1 C), temperature source Oral, resp. rate 18, height 5\' 2"  (1.575 m), weight 71.8 kg (158 lb 6.4 oz), SpO2 100 %.  PHYSICAL EXAMINATION:   Physical Exam  GENERAL:  83 y.o.-year-old patient lying in the bed with no acute distress.  EYES: Pupils equal, round, reactive to light and accommodation. No scleral  icterus. Extraocular muscles intact.  HEENT: Head atraumatic, normocephalic. Oropharynx and nasopharynx clear.  NECK:  Supple, no jugular venous distention. No thyroid enlargement, no tenderness.  LUNGS: Normal breath sounds bilaterally, no wheezing, rales, rhonchi. No use of accessory muscles of respiration.  CARDIOVASCULAR: S1, S2 normal. No murmurs, rubs, or gallops.  ABDOMEN: Soft, nontender, nondistended. Bowel sounds present. No organomegaly or mass.  EXTREMITIES: No cyanosis, clubbing or edema b/l.    NEUROLOGIC: Cranial nerves II through XII are intact. No focal Motor or sensory deficits b/l.   PSYCHIATRIC:  patient is alert and oriented x 3.  SKIN: No obvious rash, lesion, or ulcer.   LABORATORY PANEL:  CBC  Recent Labs Lab 06/01/16 0943  WBC 14.9*  HGB 11.2*  HCT 33.5*  PLT 119*    Chemistries   Recent Labs Lab 05/31/16 2152 06/01/16 0943  NA 132* 134*  K 2.8* 3.1*  CL 99* 101  CO2 21* 21*  GLUCOSE 137* 150*  BUN 13 14  CREATININE 0.79 0.92  CALCIUM 9.0 9.1  MG  --  2.0  AST 64*  --   ALT 21  --   ALKPHOS 116  --   BILITOT 1.3*  --    Cardiac Enzymes  Recent Labs Lab 06/01/16 1526  TROPONINI 0.06*   RADIOLOGY:  Dg Chest Port 1 View  Result Date: 06/01/2016 CLINICAL DATA:  83 y/o  F; shortness of breath. EXAM: PORTABLE CHEST 1 VIEW COMPARISON:  05/31/2016 chest radiograph. FINDINGS: Stable cardiac silhouette. Aortic atherosclerosis with calcification. Low lung volumes with minor bibasilar atelectasis. No focal consolidation. No pleural effusion  or pneumothorax. Bones are unremarkable. IMPRESSION: Low lung volumes.  No acute pulmonary process identified. Electronically Signed   By: Kristine Garbe M.D.   On: 06/01/2016 17:01   Dg Chest Port 1 View  Result Date: 05/31/2016 CLINICAL DATA:  Altered mental status EXAM: PORTABLE CHEST 1 VIEW COMPARISON:  05/07/2016 FINDINGS: Minimal basilar atelectasis. No consolidation or effusion. Stable  cardiomediastinal silhouette. No pneumothorax. Metallic opacity over the mandible. IMPRESSION: No active disease. Electronically Signed   By: Donavan Foil M.D.   On: 05/31/2016 22:24   ASSESSMENT AND PLAN:  83 y.o. female with a history of Anemia, anxiety, arthritis, CHF, COPD, DVT status post filter, GERD, hypertension, hyperlipidemia, hypertropic cardiomyopathy, paroxysmal atrial fibrillation on full dose aspirin alone secondary to history of GI bleed, PE, peptic ulcer disease  now being admitted with:  #.  E coli Sepsis secondary to UTI - IV antibiotics: Meropenem - IV fluid hydration - Follow up blood cultures 1/2 positive for GNR  #. Hyponatremia, mild -IV fluids  -Continue home potassium  #. Hypokalemia, mild -Replace by mouth and repeat BMP in a.m.  #. Elevated troponin, likely secondary to demand ischemia however  -Continue aspirin, Lipitor, Imdur, Brilinta  #. History of Parkinson's - Continue Sinemet  #. History of chronic back pain - Continue Valium as needed, oxycodone  #. History of depression - Continue Cymbalta  #. History of CHF - Continue Cozaar, Demadex  #. History of peptic ulcer disease -Continue Carafate  #. History of hypothyroidism -Continue Synthroid  #. History of atrial fibrillation -Continue amiodarone  #. History of COPD -Continue Dulera, Singulair and Flonase, Spiriva  #. History of GERD -Continue Protonix  Case discussed with Care Management/Social Worker. Management plans discussed with the patient, family and they are in agreement.  CODE STATUS: DNR  DVT Prophylaxis: lovenox  TOTAL TIME TAKING CARE OF THIS PATIENT: **30 minutes.  >50% time spent on counselling and coordination of care  POSSIBLE D/C IN 1-2  DAYS, DEPENDING ON CLINICAL CONDITION.  Note: This dictation was prepared with Dragon dictation along with smaller phrase technology. Any transcriptional errors that result from this process are  unintentional.  Haskell Rihn M.D on 06/01/2016 at 6:18 PM  Between 7am to 6pm - Pager - [phone]  After 6pm go to [email or web address] - password [facility]  Office  [phone]  CC: Primary care physician; BABAOFF, Caryl Bis, MD

## 2016-06-01 NOTE — Progress Notes (Signed)
Pharmacy Antibiotic Note  Hailey [name] is a 83 y.o. female admitted on 05/31/2016 with UTI.  Pharmacy has been consulted for ceftriaxone dosing.  Plan: Ceftriaxone 2 grams q 24 hours ordered.  Height: 5\' 2"  (157.5 cm) Weight: 158 lb 6.4 oz (71.8 kg) IBW/kg (Calculated) : 50.1  Temp (24hrs), Avg:99.2 F (37.3 C), Min:97.7 F (36.5 C), Max:101.6 F (38.7 C)   Recent Labs Lab 05/31/16 2152 06/01/16 0052  WBC 12.4*  --   CREATININE 0.79  --   LATICACIDVEN 3.0* 2.3*    Estimated Creatinine Clearance: 51.2 mL/min (by C-G formula based on SCr of 0.79 mg/dL).    Allergies  Allergen Reactions  . Aspirin Other (See Comments)    Reaction:  GI bleeding   . Codeine Nausea And Vomiting  . Compazine [Prochlorperazine] Other (See Comments)    Confusion  . Ketorolac Tromethamine Other (See Comments)    Reaction:  Headache   . Nsaids Other (See Comments)    Reaction:  GI bleeding   . Phenazopyridine Hcl Other (See Comments)    Reaction:  Vision problems   . Macrodantin [Nitrofurantoin Macrocrystal] Rash    Antimicrobials this admission: ceftriaxone 4/17 >>    >>   Dose adjustments this admission:   Microbiology results: 4/17 BCx: pending 4/17 UCx: pending  4/17 MRSA PCR: pending      4/17 UA: LE(+) NO2(+) WBC TNTC Thank you for allowing pharmacy to be a part of this patient's care.  Iana Buzan S 06/01/2016 2:52 AM

## 2016-06-01 NOTE — Care Management (Signed)
Patient presents from [facility]. she has had a recent stay at [facility] after discharge from [facility] with stemi.  CM unable to assess this afternoon due to decline in respiratory status, chills and fever.

## 2016-06-01 NOTE — Progress Notes (Signed)
Initial Nutrition Assessment  DOCUMENTATION CODES:   Not applicable  INTERVENTION:  1. Magic cup TID with meals, each supplement provides 290 kcal and 9 grams of protein  NUTRITION DIAGNOSIS:   Inadequate oral intake related to lethargy/confusion as evidenced by per patient/family report  GOAL:   Patient will meet greater than or equal to 90% of their needs  MONITOR:   PO intake, I & O's, Labs, Weight trends, Supplement acceptance  REASON FOR ASSESSMENT:   Malnutrition Screening Tool    ASSESSMENT:   Hailey [name] is a 83 y.o. female with a known history of Anemia, anxiety, arthritis, CHF, COPD, DVT status post filter, GERD, hypertension, hyperlipidemia, hypertropic cardiomyopathy, paroxysmal atrial fibrillation on full dose aspirin alone secondary to history of GI bleed, PE, peptic ulcer disease presents to the emergency department for evaluation of AMS,  Spoke with Hailey [name] at bedside. She reports losing 35# over the past 6 months. Per chart - she exhibits a 9#/5.4% insignificant wt loss over 4 months. She ate nothing for 3 days PTA - also had no breakfast this morning because she was confused. Prior to that she said she was "just eating bad," - would not elaborate. Denies any issues chewing/swallowing/choking  Nutrition-Focused physical exam completed. Findings are moderate fat depletion, moderate muscle depletion, and no edema.   Labs and medications reviewed: Na 134, K 3.1 KCL, Senokot NS @ 61mL/hr  Diet Order:  Diet Heart Room service appropriate? Yes; Fluid consistency: Thin  Skin:  Reviewed, no issues  Last BM:  PTA  Height:   Ht Readings from Last 1 Encounters:  05/31/16 5\' 2"  (1.575 m)    Weight:   Wt Readings from Last 1 Encounters:  06/01/16 158 lb 6.4 oz (71.8 kg)    Ideal Body Weight:  50 kg  BMI:  Body mass index is 28.97 kg/m.  Estimated Nutritional Needs:   Kcal:  1600-1900 calories  Protein:  71-86 gm  Fluid:  >/= 1.6L  EDUCATION  NEEDS:   No education needs identified at this time  Hailey [name]. Coleson Kant, MS, RD LDN Inpatient Clinical Dietitian Pager [phone]

## 2016-06-01 NOTE — Progress Notes (Signed)
[place] received a RR page for Pt in Rm 250 whom the Nurse stated was shivering. When [facility] arrived, the MT was examining the Pt who was alert but shivering. RRT was working to stabilize Pt. [place] provided support and presence. [facility] notified on-call chaplain to follow up with the Pt.    06/01/16 1700  Clinical Encounter Type  Visited With Patient;Health care provider  Visit Type Code;Trauma;Other (Comment)  Referral From Nurse  Consult/Referral To [place] (Comment)

## 2016-06-01 NOTE — Progress Notes (Signed)
MADE AWARE PATIENTS CURRENT LACTIC ACID IS 5.4. DR. Posey Pronto TEXT PAGED TO MAKE AWARE

## 2016-06-01 NOTE — Progress Notes (Addendum)
Upon entering room patient shivering. Wheezing, in distress. Sat on 2l 80%. Increased to 4l, charge nurse called into room and rapid response called. Patient then placed on non rebreather. Temp 101 ,bp stable. Dr. Posey Pronto called, tylenol 650mg  rectal ordered, chest xray ordered. During rapid shivering decreased, wheezing improving, prn neb treatment given patient placed back on 2l. Will recheck temp. Waiting on chest xray. Will continue to monitor closely.

## 2016-06-01 NOTE — Progress Notes (Signed)
[place] received a consult for AD for this Pt. [place] met Pt. Pt was lying in the bed. [place] provided a copy of an Scientist, physiological and Education on how to complete AD, but Pt requested for more time to go over it with her daughter and would call [place] when she is ready to compete it. [place] informed Pt that [facility] services were available as needed.    06/01/16 1200  Clinical Encounter Type  Visited With Patient  Visit Type Initial;Other (Comment)  Referral From Nurse  Consult/Referral To Chaplain  Spiritual Encounters  Spiritual Needs Literature

## 2016-06-01 NOTE — Progress Notes (Signed)
PHARMACY - PHYSICIAN COMMUNICATION CRITICAL VALUE ALERT - BLOOD CULTURE IDENTIFICATION (BCID)  Results for orders placed or performed during the hospital encounter of 05/31/16  Blood Culture ID Panel (Reflexed) (Collected: 05/31/2016  9:52 PM)  Result Value Ref Range   Enterococcus species NOT DETECTED NOT DETECTED   Listeria monocytogenes NOT DETECTED NOT DETECTED   Staphylococcus species NOT DETECTED NOT DETECTED   Staphylococcus aureus NOT DETECTED NOT DETECTED   Streptococcus species NOT DETECTED NOT DETECTED   Streptococcus agalactiae NOT DETECTED NOT DETECTED   Streptococcus pneumoniae NOT DETECTED NOT DETECTED   Streptococcus pyogenes NOT DETECTED NOT DETECTED   Acinetobacter baumannii NOT DETECTED NOT DETECTED   Enterobacteriaceae species DETECTED (A) NOT DETECTED   Enterobacter cloacae complex NOT DETECTED NOT DETECTED   Escherichia coli DETECTED (A) NOT DETECTED   Klebsiella oxytoca NOT DETECTED NOT DETECTED   Klebsiella pneumoniae NOT DETECTED NOT DETECTED   Proteus species NOT DETECTED NOT DETECTED   Serratia marcescens NOT DETECTED NOT DETECTED   Carbapenem resistance NOT DETECTED NOT DETECTED   Haemophilus influenzae NOT DETECTED NOT DETECTED   Neisseria meningitidis NOT DETECTED NOT DETECTED   Pseudomonas aeruginosa NOT DETECTED NOT DETECTED   Candida albicans NOT DETECTED NOT DETECTED   Candida glabrata NOT DETECTED NOT DETECTED   Candida krusei NOT DETECTED NOT DETECTED   Candida parapsilosis NOT DETECTED NOT DETECTED   Candida tropicalis NOT DETECTED NOT DETECTED    Name of physician (or Provider) ContactedPosey Pronto, [place]  Changes to prescribed antibiotics required: Continue Rocephin for now. MD will evaluate patient.   Doreatha Offer D 06/01/2016  10:00 AM

## 2016-06-01 NOTE — Progress Notes (Signed)
Patient resting in bed. Pale, no shivering noted, minimal wheezing. Remains on 2l sat 96% hr 87. Patient currently a@o . Will continue to monitor closely

## 2016-06-01 NOTE — Evaluation (Signed)
Physical Therapy Evaluation Patient Details Name: Hailey [name] MRN: [id number] DOB: 1932-11-03 Today's Date: 06/01/2016   History of Present Illness  Pt is an 83 y.o.femalewith a known history of Parkinson's Dz, anemia, anxiety, arthritis, CHF, COPD, DVT status post filter, GERD, hypertension, hyperlipidemia, hypertropic cardiomyopathy, paroxysmal atrial fibrillation on full dose aspirin alone secondary to history of GI bleed, PE, peptic ulcer diseasepresents to the emergency department for evaluation of AMS. Patient was in a usual state of health until today when she was increasingly confused, weak and fatigued. Of note patient was admitted on 05/01/2016 with ST elevation MI for which she had a drug-eluting stent placed in the LAD. Otherwise there has been no change in status.  Assessment includes Sepsis secondary to UTI, mild hyponatremia and hypokalemia, elevated troponin likely secondary demand ischemia.    Clinical Impression  Pt presents with deficits in strength, transfers, mobility, gait, balance, and activity tolerance.  Pt's Ka trending up from 2.8 to 3.1 and troponin at 0.07 from 0.05.  Spoke to Dr. Posey Pronto who reviewed chart and approved pt to participate in PT.  Pt required Mod A with bed mobility tasks to assist BLEs in and out of bed.  Pt required CGA with cues for sequencing during transfers from sit to/from stand.  Pt fatigued quickly in standing and was only able to amb 4' with RW and CGA, SpO2 94-95% and HR WNL after amb on 2LO2/min.  Pt will benefit from PT services in a SNF setting to address above deficits for safe progression towards prior level of function.      Follow Up Recommendations SNF    Equipment Recommendations  Other (comment) (To be determined in the next venue of care, pt owns a rollator but my require a RW)    Recommendations for Other Services       Precautions / Restrictions Precautions Precautions: Fall Restrictions Weight Bearing Restrictions: No       Mobility  Bed Mobility Overal bed mobility: Needs Assistance Bed Mobility: Supine to Sit;Sit to Supine       Sit to supine: Mod assist   General bed mobility comments: Mod A for BLEs in/out of bed  Transfers Overall transfer level: Needs assistance Equipment used: Rolling walker (2 wheeled) Transfers: Sit to/from Stand Sit to Stand: Min guard         General transfer comment: Pt steady upon initial stand with min verbal cues for sequencing.  Ambulation/Gait Ambulation/Gait assistance: Min guard Ambulation Distance (Feet): 4 Feet Assistive device: Rolling walker (2 wheeled) Gait Pattern/deviations: Decreased step length - right;Decreased step length - left;Trunk flexed   Gait velocity interpretation: Below normal speed for age/gender General Gait Details: Pt fatigued quickly in standing and was only able to amb 4' with RW and CGA, SpO2 and HR WNL after amb.  Stairs            Wheelchair Mobility    Modified Rankin (Stroke Patients Only)       Balance Overall balance assessment: Needs assistance Sitting-balance support: Feet supported;No upper extremity supported Sitting balance-Leahy Scale: Good     Standing balance support: Bilateral upper extremity supported Standing balance-Leahy Scale: Good                               Pertinent Vitals/Pain Pain Assessment: No/denies pain    Home Living Family/patient expects to be discharged to:: Other (Comment) ([place])  Prior Function Level of Independence: Independent with assistive device(s)         Comments: Pt Mod I with amb with rollator with no fall history, Ind with ADLs, ILF provides meals     Hand Dominance   Dominant Hand: Right    Extremity/Trunk Assessment   Upper Extremity Assessment Upper Extremity Assessment: Generalized weakness    Lower Extremity Assessment Lower Extremity Assessment: Generalized weakness        Communication   Communication: No difficulties  Cognition Arousal/Alertness: Awake/alert Behavior During Therapy: WFL for tasks assessed/performed Overall Cognitive Status: Within Functional Limits for tasks assessed                                        General Comments      Exercises Total Joint Exercises Ankle Circles/Pumps: AROM;Both;10 reps Quad Sets: Strengthening;Both;10 reps Gluteal Sets: Strengthening;Both;10 reps Heel Slides: AAROM;Both;5 reps Hip ABduction/ADduction: AAROM;Both;10 reps Straight Leg Raises: AAROM;Both;10 reps Long Arc Quad: AROM;Both;10 reps Knee Flexion: AROM;Both;10 reps   Assessment/Plan    PT Assessment Patient needs continued PT services  PT Problem List Decreased strength;Decreased activity tolerance;Decreased balance;Decreased mobility       PT Treatment Interventions Gait training;DME instruction;Functional mobility training;Neuromuscular re-education;Balance training;Therapeutic exercise;Therapeutic activities;Patient/family education    PT Goals (Current goals can be found in the Care Plan section)  Acute Rehab PT Goals Patient Stated Goal: To get stronger and walk better PT Goal Formulation: With patient Time For Goal Achievement: 06/14/16 Potential to Achieve Goals: Good    Frequency Min 2X/week   Barriers to discharge        Co-evaluation               End of Session Equipment Utilized During Treatment: Gait belt;Oxygen Activity Tolerance: Patient limited by fatigue Patient left: in bed;with bed alarm set;with call bell/phone within reach Nurse Communication: Mobility status PT Visit Diagnosis: Difficulty in walking, not elsewhere classified (R26.2);Muscle weakness (generalized) (M62.81)    Time: 9311-2162 PT Time Calculation (min) (ACUTE ONLY): 30 min   Charges:   PT Evaluation $PT Eval Low Complexity: 1 Procedure PT Treatments $Therapeutic Exercise: 8-22 mins   PT G Codes:        D.  Scott Tiarah Shisler PT, DPT 06/01/16, 1:32 PM

## 2016-06-01 NOTE — H&P (Signed)
History and Physical   [facility] [place] [facility] Admission History and Physical [employer], D.O.    Patient Name: Hailey [name] MR#: [id number] Date of Birth: 1932/04/19 Date of Admission: 05/31/2016  Referring MD/NP/PA: Dr. Mable Paris Primary Care Physician: Marcello Fennel, MD Patient coming from: [place]   Chief Complaint:  Chief Complaint  Patient presents with  . Altered Mental Status    HPI: Hailey [name] is a 83 y.o. female with a known history of Anemia, anxiety, arthritis, CHF, COPD, DVT status post filter, GERD, hypertension, hyperlipidemia, hypertropic cardiomyopathy, paroxysmal atrial fibrillation on full dose aspirin alone secondary to history of GI bleed, PE, peptic ulcer disease presents to the emergency department for evaluation of AMS,.  Patient was in a usual state of health until today when she was increasingly confused, weak and fatigued.  Patient denies fevers/chills, dizziness, chest pain, shortness of breath, N/V/C/D, abdominal pain, dysuria/frequency, changes in mental status.   Of note patient was admitted on 05/01/2016 with ST elevation MI for which she had a drug-eluting stent placed in the LAD.  Otherwise there has been no change in status. Patient has been taking medication as prescribed and there has been no recent change in medication or diet.  No recent antibiotics.  There has been no recent illness, hospitalizations, travel or sick contacts.    EMS/ED Course: Patient received Rocephin, Tylenol, IV normal saline.  Review of Systems:  CONSTITUTIONAL: Positive fatigue, weakness.  No fever/chills, weight gain/loss, headache. EYES: No blurry or double vision. ENT: No tinnitus, postnasal drip, redness or soreness of the oropharynx. RESPIRATORY: No cough, dyspnea, wheeze.  No hemoptysis.  CARDIOVASCULAR: No chest pain, palpitations, syncope, orthopnea. No lower extremity edema.  GASTROINTESTINAL: No nausea, vomiting, abdominal pain, diarrhea,  constipation.  No hematemesis, melena or hematochezia. GENITOURINARY: No dysuria, frequency, hematuria. ENDOCRINE: No polyuria or nocturia. No heat or cold intolerance. HEMATOLOGY: No anemia, bruising, bleeding. INTEGUMENTARY: No rashes, ulcers, lesions. MUSCULOSKELETAL: No arthritis, gout, dyspnea. NEUROLOGIC: No numbness, tingling, ataxia, seizure-type activity, weakness. PSYCHIATRIC: No anxiety, depression, insomnia.   Past Medical History:  Diagnosis Date  . Anemia    Chronic  . Anxiety   . Arthritis   . Asthma   . Atrophic vaginitis   . Cerebral aneurysm    Hx of  . CHF (congestive heart failure) ([place])   . COPD (chronic obstructive pulmonary disease) ([place])   . Diverticulitis   . DVT (deep vein thrombosis) in pregnancy ([place]) 09/2008   a. LLE, recurrent hx, has IVC filter. Not on coumadin now with history of GI bleeding  . Fibrocystic disease of breast   . Fibromyalgia   . GERD (gastroesophageal reflux disease)   . Gout   . Gross hematuria   . Heart murmur   . History of colonoscopy   . Hyperlipidemia   . Hypertension   . Hypertrophic cardiomyopathy ([place])    a. Echo 4/09 with EF 70-75%, asymmetricy basal septal hypertrophy, mild MR without systolic anterior motion of the mitral valve, LVOT gradient to 130 mmHg with Valsalva b. Echo 7/13: EF 60-65%, mild focal basal septal hypertrophy, no significant LVOT gradient, no MV SAM c. Echo 2/15: EF 55-60%, HOCM, resting LVOT gradient 29 mmHg, Valsalva LVOT gradient > 140 mmHg, mild LVH, mild TR, elevated PASP  . Incomplete bladder emptying   . Migraine headache    Hx of  . Obesity   . OSA (obstructive sleep apnea)    a. Intolerant to CPAP, wears 2L via n/c  .  Osteoarthritis    Hx of left TKR  . PAF (paroxysmal atrial fibrillation) ([place])    a. Full-dose ASA alone 2/2 h/o GIB.  [place] Kitchen PAT (paroxysmal atrial tachycardia) ([place])   . 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 Pneumonia   . PUD (peptic  ulcer disease)    With GI bleeding  . Pulmonary embolism ([place])   . Tremor     Past Surgical History:  Procedure Laterality Date  . ABDOMINAL HYSTERECTOMY    . APPENDECTOMY    . BLADDER SUSPENSION    . CARDIAC CATHETERIZATION  2009   No significant CAD  . CARDIOVASCULAR STRESS TEST     a. Lexiscan Myoview 3/14: EF 76%, no evidence of ischemia or WMAs  . cataract surgery    . CEREBRAL ANEURYSM REPAIR  1998  . CORONARY STENT INTERVENTION N/A 05/01/2016   Procedure: Coronary Stent Intervention;  Surgeon: Yolonda Kida, MD;  Location: [facility];  Service: Cardiovascular;  Laterality: N/A;  . ESOPHAGOGASTRODUODENOSCOPY (EGD) WITH PROPOFOL N/A 11/24/2015   Procedure: ESOPHAGOGASTRODUODENOSCOPY (EGD) WITH PROPOFOL;  Surgeon: Lollie Sails, MD;  Location: [facility] ENDOSCOPY;  Service: Endoscopy;  Laterality: N/A;  . FOOT SURGERY     Bilateral  . INGUINAL HERNIA REPAIR  1968   Right  . KNEE ARTHROSCOPY  2009   Right  . LEFT HEART CATH AND CORONARY ANGIOGRAPHY N/A 05/01/2016   Procedure: Left Heart Cath and Coronary Angiography;  Surgeon: Yolonda Kida, MD;  Location: [facility];  Service: Cardiovascular;  Laterality: N/A;  . SHOULDER SURGERY  1972   Right   Left 2012  . TONSILLECTOMY AND ADENOIDECTOMY    . TOTAL KNEE ARTHROPLASTY     Left     reports that she quit smoking about 7 years ago. Her smoking use included Cigarettes. She has a 60.00 pack-year smoking history. She has never used smokeless tobacco. She reports that she drinks alcohol. She reports that she does not use drugs.  Allergies  Allergen Reactions  . Aspirin Other (See Comments)    Reaction:  GI bleeding   . Codeine Nausea And Vomiting  . Compazine [Prochlorperazine] Other (See Comments)    Confusion  . Ketorolac Tromethamine Other (See Comments)    Reaction:  Headache   . Nsaids Other (See Comments)    Reaction:  GI bleeding   . Phenazopyridine Hcl Other (See Comments)    Reaction:   Vision problems   . Macrodantin [Nitrofurantoin Macrocrystal] Rash    Family History  Problem Relation Age of Onset  . Pancreatic cancer Mother   . Hypertension Mother     father  . Diabetes Mellitus II Mother     Sister  . Colon cancer Father   . Colon polyps Father   . Heart disease Father   . Stroke Father   . Heart disease Sister     More than 1 sister  . Colon cancer Brother   . Colon polyps Other     Siblings  . Ulcers Brother     Prior to Admission medications   Medication Sig Start Date End Date Taking? Authorizing Provider  acetaminophen (TYLENOL) 325 MG tablet Take 2 tablets (650 mg total) by mouth every 6 (six) hours as needed for mild pain or moderate pain (temp > 101.5). 05/25/15  Yes Nicholes Mango, MD  albuterol (PROVENTIL HFA;VENTOLIN HFA) 108 (90 Base) MCG/ACT inhaler Inhale 1 puff into the lungs every 6 (six) hours as  needed for wheezing or shortness of breath.   Yes Historical Provider, MD  albuterol (PROVENTIL) (2.5 MG/3ML) 0.083% nebulizer solution Take 3 mLs (2.5 mg total) by nebulization every 4 (four) hours as needed for wheezing or shortness of breath. 05/25/15  Yes Nicholes Mango, MD  amiodarone (PACERONE) 200 MG tablet Take 1 tablet (200 mg total) by mouth daily. Pt is able to take an additional tablet if needed for breakthrough arrhythmias. 08/04/15  Yes Minna Merritts, MD  aspirin EC 81 MG EC tablet Take 1 tablet (81 mg total) by mouth daily. 05/06/16  Yes Vipul Manuella Ghazi, MD  atorvastatin (LIPITOR) 80 MG tablet Take 1 tablet (80 mg total) by mouth daily at 6 PM. 05/06/16  Yes Vipul Manuella Ghazi, MD  carbidopa-levodopa (SINEMET IR) 25-250 MG tablet Take 1 tablet by mouth 4 (four) times daily. 05/06/16  Yes Vipul Manuella Ghazi, MD  chlorhexidine (PERIDEX) 0.12 % solution 15 mLs by Mouth Rinse route 2 (two) times daily. 04/14/16  Yes Historical Provider, MD  diazepam (VALIUM) 2 MG tablet Take 1 tablet (2 mg total) by mouth every 6 (six) hours as needed for anxiety (or back pain). 05/06/16   Yes Vipul Manuella Ghazi, MD  dimenhyDRINATE (DRAMAMINE) 50 MG tablet Take 25-50 mg by mouth every 6 (six) hours as needed for dizziness.    Yes Historical Provider, MD  docusate sodium (COLACE) 100 MG capsule Take 100 mg by mouth 2 (two) times daily as needed for mild constipation.   Yes Historical Provider, MD  DULoxetine (CYMBALTA) 30 MG capsule Take 3 capsules (90 mg total) by mouth daily. 05/07/16  Yes Vipul Manuella Ghazi, MD  feeding supplement, ENSURE ENLIVE, (ENSURE ENLIVE) LIQD Take 237 mLs by mouth 2 (two) times daily between meals. 01/28/15  Yes Sital Mody, MD  fluticasone (FLONASE) 50 MCG/ACT nasal spray Place 2 sprays into both nostrils daily as needed for rhinitis.   Yes Historical Provider, MD  gabapentin (NEURONTIN) 100 MG capsule Take 200-400 mg by mouth 3 (three) times daily. Pt takes three capsules in the morning, two capsules in the evening, and four capsules at bedtime.   Yes Historical Provider, MD  isosorbide mononitrate (IMDUR) 30 MG 24 hr tablet Take 1 tablet (30 mg total) by mouth daily. 05/06/16  Yes Max Sane, MD  Levothyroxine Sodium 75 MCG CAPS Take 50 mcg by mouth daily before breakfast.    Yes Historical Provider, MD  losartan (COZAAR) 25 MG tablet Take 25 mg by mouth daily. 11/27/15  Yes Historical Provider, MD  mometasone-formoterol (DULERA) 100-5 MCG/ACT AERO Inhale 2 puffs into the lungs 2 (two) times daily. 05/25/15  Yes Nicholes Mango, MD  montelukast (SINGULAIR) 10 MG tablet Take 10 mg by mouth at bedtime.   Yes Historical Provider, MD  ondansetron (ZOFRAN-ODT) 4 MG disintegrating tablet Take 4 mg by mouth every 8 (eight) hours as needed for nausea or vomiting.   Yes Historical Provider, MD  oxyCODONE (OXY IR/ROXICODONE) 5 MG immediate release tablet Take 1 tablet (5 mg total) by mouth every 6 (six) hours as needed for moderate pain or severe pain. 05/06/16  Yes Vipul Manuella Ghazi, MD  pantoprazole (PROTONIX) 40 MG tablet Take 40 mg by mouth daily.   Yes Historical Provider, MD  polyethylene  glycol (MIRALAX / GLYCOLAX) packet Take 17 g by mouth daily as needed for moderate constipation.    Yes Historical Provider, MD  potassium chloride (K-DUR,KLOR-CON) 10 MEQ tablet Take 1 tablet (10 mEq total) by mouth 3 (three) times daily. 05/27/16  Yes Kathlene November  Rockey Situ, MD  senna (SENOKOT) 8.6 MG TABS tablet Take 2 tablets (17.2 mg total) by mouth 2 (two) times daily. 05/25/15  Yes Nicholes Mango, MD  sucralfate (CARAFATE) 1 g tablet Take 1 g by mouth 2 (two) times daily. 03/21/16  Yes Historical Provider, MD  ticagrelor (BRILINTA) 90 MG TABS tablet Take 1 tablet (90 mg total) by mouth 2 (two) times daily. 05/06/16  Yes Vipul Manuella Ghazi, MD  tiotropium (SPIRIVA) 18 MCG inhalation capsule Place 18 mcg into inhaler and inhale daily.   Yes Historical Provider, MD  torsemide (DEMADEX) 20 MG tablet Take 1 tablet (20 mg total) by mouth 2 (two) times daily. 10/24/15  Yes Gladstone Lighter, MD  triamcinolone ointment (KENALOG) 0.1 % Apply 1 application topically 2 (two) times daily as needed (for rash).    Yes Historical Provider, MD    Physical Exam: Vitals:   05/31/16 2145 05/31/16 2146 05/31/16 2300 05/31/16 2330  BP:  107/64 (!) 130/43 (!) 133/42  Pulse:  80 77 77  Resp:  (!) 22 (!) 29 (!) 27  Temp:  (!) 101.6 F (38.7 C)    TempSrc:  Oral    SpO2:  97% 100% 96%  Weight: 88 kg (194 lb 0.1 oz)     Height: 5\' 2"  (1.575 m)       GENERAL: 83 y.o.-year-old Female patient, well-developed, well-nourished lying in the bed in no acute distress.  Pleasant and cooperative.   HEENT: Head atraumatic, normocephalic. Pupils equal, round, reactive to light and accommodation. No scleral icterus. Extraocular muscles intact. Nares are patent. Oropharynx is clear. Mucus membranes moist. NECK: Supple, full range of motion. No JVD, positive bruit heard.. CHEST: Normal breath sounds bilaterally. No wheezing, rales, rhonchi or crackles. No use of accessory muscles of respiration.  No reproducible chest wall tenderness.   CARDIOVASCULAR: S1, S2 normal. SEM at RSB/LSB. Cap refill <2 seconds. Pulses intact distally.  ABDOMEN: Soft, nondistended, nontender. No rebound, guarding, rigidity. Normoactive bowel sounds present in all four quadrants. No organomegaly or mass. EXTREMITIES: No pedal edema, cyanosis, or clubbing. No calf tenderness or Homan's sign.  NEUROLOGIC: The patient is alert and oriented x 3. Cranial nerves II through XII are grossly intact with no focal sensorimotor deficit. Muscle strength 5/5 in all extremities. Sensation intact. Gait not checked. PSYCHIATRIC:  Normal affect, mood, thought content. SKIN: Warm, dry, and intact without obvious rash, lesion, or ulcer.    Labs on Admission:  CBC:  Recent Labs Lab 05/31/16 2152  WBC 12.4*  HGB 11.6*  HCT 33.7*  MCV 91.7  PLT 240*   Basic Metabolic Panel:  Recent Labs Lab 05/31/16 2152  NA 132*  K 2.8*  CL 99*  CO2 21*  GLUCOSE 137*  BUN 13  CREATININE 0.79  CALCIUM 9.0   GFR: Estimated Creatinine Clearance: 56.9 mL/min (by C-G formula based on SCr of 0.79 mg/dL). Liver Function Tests:  Recent Labs Lab 05/31/16 2152  AST 64*  ALT 21  ALKPHOS 116  BILITOT 1.3*  PROT 6.3*  ALBUMIN 3.5    Recent Labs Lab 05/31/16 2152  LIPASE 10*   No results for input(s): AMMONIA in the last 168 hours. Coagulation Profile:  Recent Labs Lab 05/31/16 2152  INR 1.23   Cardiac Enzymes:  Recent Labs Lab 05/31/16 2152  TROPONINI 0.05*   BNP (last 3 results) No results for input(s): PROBNP in the last 8760 hours. HbA1C: No results for input(s): HGBA1C in the last 72 hours. CBG: No results for input(s): GLUCAP  in the last 168 hours. Lipid Profile: No results for input(s): CHOL, HDL, LDLCALC, TRIG, CHOLHDL, LDLDIRECT in the last 72 hours. Thyroid Function Tests: No results for input(s): TSH, T4TOTAL, FREET4, T3FREE, THYROIDAB in the last 72 hours. Anemia Panel: No results for input(s): VITAMINB12, FOLATE, FERRITIN, TIBC,  IRON, RETICCTPCT in the last 72 hours. Urine analysis:    Component Value Date/Time   COLORURINE AMBER (A) 05/31/2016 2152   APPEARANCEUR CLOUDY (A) 05/31/2016 2152   APPEARANCEUR Hazy (A) 08/07/2014 1124   LABSPEC 1.015 05/31/2016 2152   LABSPEC 1.014 01/14/2014 1543   PHURINE 5.0 05/31/2016 2152   GLUCOSEU NEGATIVE 05/31/2016 2152   GLUCOSEU >=500 01/14/2014 1543   GLUCOSEU NEGATIVE 09/17/2008 1107   HGBUR MODERATE (A) 05/31/2016 2152   BILIRUBINUR NEGATIVE 05/31/2016 2152   BILIRUBINUR Negative 08/07/2014 1124   BILIRUBINUR Negative 01/14/2014 1543   KETONESUR 5 (A) 05/31/2016 2152   PROTEINUR 100 (A) 05/31/2016 2152   UROBILINOGEN 0.2 09/17/2008 1107   NITRITE POSITIVE (A) 05/31/2016 2152   LEUKOCYTESUR SMALL (A) 05/31/2016 2152   LEUKOCYTESUR 1+ (A) 08/07/2014 1124   LEUKOCYTESUR Negative 01/14/2014 1543   Sepsis Labs: @LABRCNTIP (procalcitonin:4,lacticidven:4) )No results found for this or any previous visit (from the past 240 hour(s)).   Radiological Exams on Admission: Dg Chest Port 1 View  Result Date: 05/31/2016 CLINICAL DATA:  Altered mental status EXAM: PORTABLE CHEST 1 VIEW COMPARISON:  05/07/2016 FINDINGS: Minimal basilar atelectasis. No consolidation or effusion. Stable cardiomediastinal silhouette. No pneumothorax. Metallic opacity over the mandible. IMPRESSION: No active disease. Electronically Signed   By: Donavan Foil M.D.   On: 05/31/2016 22:24    EKG: Normal sinus rhythm at 81 bpm with ST depression, V3, V5.  Will repeat.  Assessment/Plan  This is a 83 y.o. female with a history of Anemia, anxiety, arthritis, CHF, COPD, DVT status post filter, GERD, hypertension, hyperlipidemia, hypertropic cardiomyopathy, paroxysmal atrial fibrillation on full dose aspirin alone secondary to history of GI bleed, PE, peptic ulcer disease  now being admitted with:  #. Sepsis secondary to UTI - Admit to inpatient with telemetry monitoring - IV antibiotics: Rocephin - IV  fluid hydration - Follow up blood,urine cultures - Repeat CBC in am.   #. Hyponatremia, mild -IV fluids and repeat BMP in a.m. -Continue home potassium  #. Hypokalemia, mild -Replace by mouth and repeat BMP in a.m.  #. Elevated troponin, likely secondary to drink as ischemia however given recent ST elevation MI we'll monitor on telemetry and trend troponins -Continue aspirin, Lipitor, Imdur, Brilinta - Repeat EKG  #. History of Parkinson's - Continue Sinemet  #. History of chronic back pain - Continue Valium as needed, oxycodone  #. History of depression - Continue Cymbalta  #. History of CHF - Continue Cozaar, Demadex  #. History of peptic ulcer disease -Continue Carafate  #. History of hypothyroidism -Continue Synthroid  #. History of atrial fibrillation -Continue amiodarone  #. History of COPD -Continue Dulera, Singulair and Flonase, Spiriva  #. History of GERD -Continue Protonix  Admission status: Inpatient, tele IV Fluids: Normal saline Diet/Nutrition: Heart healthy Consults called: None  DVT Px: Lovenox, SCDs and early ambulation. Code Status: Full Code  Disposition Plan: To home in 1-2 days  All the records are reviewed and case discussed with ED provider. Management plans discussed with the patient and/or family who express understanding and agree with plan of care.  Cuca Benassi D.O. on 06/01/2016 at 12:17 AM Between 7am to 6pm - Pager - [phone] After 6pm go  to [email or web address] - [employer] [facility] [place] [facility] Office [phone] CC: Primary care physician; Marcello Fennel, MD   06/01/2016, 12:17 AM

## 2016-06-01 NOTE — Progress Notes (Signed)
Rt responded to rapid response call, patient was on NRB mask when Entered room with sats of 100% once saturation was obtained. RN had placed patient on NRB due to low sats initially. Patient was gradually weaned back down to [place] at 3l with sats remaining 100%.. PRN albuterol treatment given.

## 2016-06-02 NOTE — Progress Notes (Signed)
[place] [facility] [place] NAME: Hailey [name]    MR#:  [id number]  DATE OF BIRTH:  1932-12-06  SUBJECTIVE:   Came with weakness, vomiting and nausea Feels better today REVIEW OF SYSTEMS:   Review of Systems  Constitutional: Positive for chills. Negative for fever and weight loss.  HENT: Negative for ear discharge, ear pain and nosebleeds.   Eyes: Negative for blurred vision, pain and discharge.  Respiratory: Negative for sputum production, shortness of breath, wheezing and stridor.   Cardiovascular: Negative for chest pain, palpitations, orthopnea and PND.  Gastrointestinal: Negative for abdominal pain, diarrhea, nausea and vomiting.  Genitourinary: Positive for dysuria. Negative for frequency and urgency.  Musculoskeletal: Negative for back pain and joint pain.  Neurological: Positive for weakness. Negative for sensory change, speech change and focal weakness.  Psychiatric/Behavioral: Negative for depression and hallucinations. The patient is not nervous/anxious.    Tolerating Diet:yes  Tolerating PT: SNF  DRUG ALLERGIES:   Allergies  Allergen Reactions  . Aspirin Other (See Comments)    Reaction:  GI bleeding   . Codeine Nausea And Vomiting  . Compazine [Prochlorperazine] Other (See Comments)    Confusion  . Ketorolac Tromethamine Other (See Comments)    Reaction:  Headache   . Nsaids Other (See Comments)    Reaction:  GI bleeding   . Phenazopyridine Hcl Other (See Comments)    Reaction:  Vision problems   . Macrodantin [Nitrofurantoin Macrocrystal] Rash    VITALS:  Blood pressure (!) 108/50, pulse 60, temperature 98.6 F (37 C), temperature source Oral, resp. rate 16, height 5\' 2"  (1.575 m), weight 71.8 kg (158 lb 6.4 oz), SpO2 98 %.  PHYSICAL EXAMINATION:   Physical Exam  GENERAL:  83 y.o.-year-old patient lying in the bed with no acute distress.  EYES: Pupils equal, round, reactive to light and accommodation. No  scleral icterus. Extraocular muscles intact.  HEENT: Head atraumatic, normocephalic. Oropharynx and nasopharynx clear.  NECK:  Supple, no jugular venous distention. No thyroid enlargement, no tenderness.  LUNGS: Normal breath sounds bilaterally, no wheezing, rales, rhonchi. No use of accessory muscles of respiration.  CARDIOVASCULAR: S1, S2 normal. No murmurs, rubs, or gallops.  ABDOMEN: Soft, nontender, nondistended. Bowel sounds present. No organomegaly or mass.  EXTREMITIES: No cyanosis, clubbing or edema b/l.    NEUROLOGIC: Cranial nerves II through XII are intact. No focal Motor or sensory deficits b/l.   PSYCHIATRIC:  patient is alert and oriented x 3.  SKIN: No obvious rash, lesion, or ulcer.   LABORATORY PANEL:  CBC  Recent Labs Lab 06/01/16 0943  WBC 14.9*  HGB 11.2*  HCT 33.5*  PLT 119*    Chemistries   Recent Labs Lab 05/31/16 2152 06/01/16 0943  NA 132* 134*  K 2.8* 3.1*  CL 99* 101  CO2 21* 21*  GLUCOSE 137* 150*  BUN 13 14  CREATININE 0.79 0.92  CALCIUM 9.0 9.1  MG  --  2.0  AST 64*  --   ALT 21  --   ALKPHOS 116  --   BILITOT 1.3*  --    Cardiac Enzymes  Recent Labs Lab 06/01/16 1526  TROPONINI 0.06*   RADIOLOGY:  Dg Chest Port 1 View  Result Date: 06/01/2016 CLINICAL DATA:  83 y/o  F; shortness of breath. EXAM: PORTABLE CHEST 1 VIEW COMPARISON:  05/31/2016 chest radiograph. FINDINGS: Stable cardiac silhouette. Aortic atherosclerosis with calcification. Low lung volumes with minor bibasilar atelectasis. No focal consolidation. No  pleural effusion or pneumothorax. Bones are unremarkable. IMPRESSION: Low lung volumes.  No acute pulmonary process identified. Electronically Signed   By: Kristine Garbe M.D.   On: 06/01/2016 17:01   Dg Chest Port 1 View  Result Date: 05/31/2016 CLINICAL DATA:  Altered mental status EXAM: PORTABLE CHEST 1 VIEW COMPARISON:  05/07/2016 FINDINGS: Minimal basilar atelectasis. No consolidation or effusion. Stable  cardiomediastinal silhouette. No pneumothorax. Metallic opacity over the mandible. IMPRESSION: No active disease. Electronically Signed   By: Donavan Foil M.D.   On: 05/31/2016 22:24   ASSESSMENT AND PLAN:  83 y.o. female with a history of Anemia, anxiety, arthritis, CHF, COPD, DVT status post filter, GERD, hypertension, hyperlipidemia, hypertropic cardiomyopathy, paroxysmal atrial fibrillation on full dose aspirin alone secondary to history of GI bleed, PE, peptic ulcer disease  now being admitted with:  #.  E coli Sepsis secondary to UTI - IV antibiotics: Meropenem - recieved IV fluid hydration - Follow up blood cultures 1/2 positive for GNR  #. Hyponatremia, mild -recieved IV fluids  -Continue home potassium  #. Hypokalemia, mild -replace orally  #. Elevated troponin, likely secondary to demand ischemia however  -Continue aspirin, Lipitor, Imdur, Brilinta  #. History of Parkinson's - Continue Sinemet  #. History of chronic back pain - Continue Valium as needed, oxycodone  #. History of depression - Continue Cymbalta  #. History of CHF - Continue Cozaar, Demadex  #. History of peptic ulcer disease -Continue Carafate  #. History of hypothyroidism -Continue Synthroid  #. History of atrial fibrillation -Continue amiodarone  #. History of COPD -Continue Dulera, Singulair and Flonase, Spiriva  #. History of GERD -Continue Protonix  Case discussed with Care Management/Social Worker. Management plans discussed with the patient, family and they are in agreement.  CODE STATUS: DNR  DVT Prophylaxis: lovenox  TOTAL TIME TAKING CARE OF THIS PATIENT: **30 minutes.  >50% time spent on counselling and coordination of care  POSSIBLE D/C IN 1-2  DAYS, DEPENDING ON CLINICAL CONDITION.  Note: This dictation was prepared with Dragon dictation along with smaller phrase technology. Any transcriptional errors that result from this process are  unintentional.  Mckenna Boruff M.D on 06/02/2016 at 3:00 PM  Between 7am to 6pm - Pager - [phone]  After 6pm go to [email or web address] - password [facility]  Office  [phone]  CC: Primary care physician; BABAOFF, Caryl Bis, MD

## 2016-06-02 NOTE — Care Management Note (Signed)
Case Management Note  Patient Details  Name: Hailey [name] MRN: [id number] Date of Birth: 1932-04-18  Subjective/Objective:         Patient resides at [facility].  She had a recent discharge from [facility] within the last week.  Receiving home health services through [facility].  At present, there is a recommendation for skilled nursing placement.           Action/Plan:   Expected Discharge Date:                  Expected Discharge Plan:     In-House Referral:     Discharge planning Services     Post Acute Care Choice:    Choice offered to:     DME Arranged:    DME Agency:     HH Arranged:    HH Agency:     Status of Service:     If discussed at [employer] of Stay Meetings, dates discussed:    Additional Comments:  Katrina Stack, RN 06/02/2016, 11:07 AM

## 2016-06-02 NOTE — Plan of Care (Signed)
Problem: Physical Regulation: Goal: Ability to maintain clinical measurements within normal limits will improve Outcome: Progressing Lactic acid level 1.9 Goal: Will remain free from infection Outcome: Progressing IV abx

## 2016-06-02 NOTE — NC FL2 (Signed)
[place] LEVEL OF CARE SCREENING TOOL     IDENTIFICATION  Patient Name: Hailey [name] Birthdate: July 17, 1932 Sex: female Admission Date (Current Location): 05/31/2016  [place] and [place] Number:  Engineering geologist and Address:  [facility], [address], [place],  [address]      Provider Number: [id number]  Attending Physician Name and Address:  Fritzi Mandes, MD  Relative Name and Phone Number:  Lysle Rubens Daughter [phone] or  Jozette, [place]   [phone] or Jessica Priest   [phone]     Current Level of Care: Hospital Recommended Level of Care: [place] Prior Approval Number:    Date Approved/Denied:   PASRR Number: [id number]  Discharge Plan: SNF    Current Diagnoses: Patient Active Problem List   Diagnosis Date Noted  . Sepsis secondary to UTI ([place]) 06/01/2016  . Acute ST elevation myocardial infarction (STEMI) involving left anterior descending (LAD) coronary artery ([place]) 05/01/2016  . STEMI (ST elevation myocardial infarction) ([place]) 05/01/2016  . Protein-calorie malnutrition, severe 10/23/2015  . Uncontrollable vomiting   . Nausea and vomiting 10/22/2015  . Tremor 07/07/2015  . Respiratory failure ([place]) 05/19/2015  . Weakness 01/27/2015  . Abdomen enlarged 09/26/2013  . CTCL (cutaneous T-cell lymphoma) ([place]) 09/18/2013  . Anxiety 08/09/2013  . CCF (congestive cardiac failure) ([place]) 08/09/2013  . Chronic pain 08/09/2013  . Chronic diastolic CHF (congestive heart failure) ([place]) 07/12/2013  . Benign essential HTN 07/01/2013  . Bilateral leg edema 06/06/2013  . PAF (paroxysmal atrial fibrillation) ([place]) 04/25/2013  . DOE (dyspnea on exertion) 10/11/2011  . COPD (chronic obstructive pulmonary disease) ([place]) 10/09/2011  . Cognitive decline 10/09/2011  . Behavior concern 08/23/2011  . Chest pain 10/26/2010  . Palpitations 10/26/2010  . PHLEBITIS AND THROMBOPHLEBITIS OF FEMORAL VEIN  10/13/2008  . DEEP VENOUS THROMBOPHLEBITIS, LEG, LEFT 10/09/2008  . GASTRIC ULCER, ACUTE 10/09/2008  . PERSONAL HX COLONIC POLYPS 09/02/2008  . FATIGUE 08/27/2008  . HOCM (hypertrophic obstructive cardiomyopathy) ([place]) 11/06/2007  . HYPOTENSION, ORTHOSTATIC 11/06/2007  . ADENOMATOUS COLONIC POLYP 04/12/2007  . HIATAL HERNIA 04/12/2007  . Diverticulosis of colon (without mention of hemorrhage) 04/12/2007  . Hyperlipidemia 02/03/2007  . ANEMIA, CHRONIC 02/03/2007  . MIGRAINE HEADACHE 02/03/2007  . Essential hypertension 02/03/2007  . CEREBRAL ANEURYSM 02/03/2007  . GASTROESOPHAGEAL REFLUX DISEASE 02/03/2007  . OSTEOARTHRITIS 02/03/2007  . FIBROMYALGIA 02/03/2007  . CHRONIC FATIGUE SYNDROME 02/03/2007  . MITRAL VALVE PROLAPSE, HX OF 02/03/2007  . PULMONARY EMBOLISM, HX OF 02/03/2007  . TOTAL KNEE REPLACEMENT, LEFT, HX OF 02/03/2007  . HYSTERECTOMY, HX OF 02/03/2007  . Other acquired absence of organ 02/03/2007  . INGUINAL HERNIORRHAPHY, RIGHT, HX OF 02/03/2007    Orientation RESPIRATION BLADDER Height & Weight     Self, Time, Situation, Place  O2 (2L) Continent Weight: 158 lb 6.4 oz (71.8 kg) Height:  5\' 2"  (157.5 cm)  BEHAVIORAL SYMPTOMS/MOOD NEUROLOGICAL BOWEL NUTRITION STATUS      Continent Diet (Cardiac)  AMBULATORY STATUS COMMUNICATION OF NEEDS Skin   Limited Assist Verbally Normal                       Personal Care Assistance Level of Assistance  Bathing, Feeding, Dressing Bathing Assistance: Limited assistance Feeding assistance: Independent Dressing Assistance: Limited assistance     Functional Limitations Info  Sight, Hearing, Speech Sight Info: Adequate Hearing Info: Adequate Speech Info: Adequate    SPECIAL CARE FACTORS FREQUENCY  PT (By licensed PT)  PT Frequency: 5x a week              Contractures      Additional Factors Info  Psychotropic Code Status Info: DNR Allergies Info: ASPIRIN, CODEINE, COMPAZINE PROCHLORPERAZINE,  KETOROLAC TROMETHAMINE, NSAIDS, PHENAZOPYRIDINE HCL, MACRODANTIN NITROFURANTOIN MACROCRYSTAL  Psychotropic Info: DULoxetine (CYMBALTA) DR capsule 90 mg         Current Medications (06/02/2016):  This is the current hospital active medication list Current Facility-Administered Medications  Medication Dose Route Frequency Provider Last Rate Last Dose  . 0.9 %  sodium chloride infusion   Intravenous Continuous Alexis Hugelmeyer, DO 75 mL/hr at 06/01/16 1619    . acetaminophen (TYLENOL) suppository 650 mg  650 mg Rectal Q4H PRN Fritzi Mandes, MD   650 mg at 06/01/16 1625  . acetaminophen (TYLENOL) tablet 650 mg  650 mg Oral Q6H PRN Alexis Hugelmeyer, DO   650 mg at 06/02/16 2671  . albuterol (PROVENTIL) (2.5 MG/3ML) 0.083% nebulizer solution 2.5 mg  2.5 mg Nebulization Q4H PRN Alexis Hugelmeyer, DO   2.5 mg at 06/01/16 1634  . amiodarone (PACERONE) tablet 200 mg  200 mg Oral Daily Alexis Hugelmeyer, DO   200 mg at 06/02/16 0823  . aspirin EC tablet 81 mg  81 mg Oral Daily Alexis Hugelmeyer, DO   81 mg at 06/02/16 0823  . atorvastatin (LIPITOR) tablet 80 mg  80 mg Oral q1800 Alexis Hugelmeyer, DO   80 mg at 06/01/16 1741  . carbidopa-levodopa (SINEMET IR) 25-250 MG per tablet immediate release 1 tablet  1 tablet Oral QID Alexis Hugelmeyer, DO   1 tablet at 06/02/16 0823  . chlorhexidine (PERIDEX) 0.12 % solution 15 mL  15 mL Mouth Rinse BID Alexis Hugelmeyer, DO   15 mL at 06/02/16 0824  . diazepam (VALIUM) tablet 2 mg  2 mg Oral Q6H PRN Alexis Hugelmeyer, DO   2 mg at 06/01/16 0342  . dimenhyDRINATE (DRAMAMINE) tablet 25-50 mg  25-50 mg Oral Q6H PRN Alexis Hugelmeyer, DO      . docusate sodium (COLACE) capsule 100 mg  100 mg Oral BID PRN Alexis Hugelmeyer, DO      . DULoxetine (CYMBALTA) DR capsule 90 mg  90 mg Oral Daily Alexis Hugelmeyer, DO   90 mg at 06/02/16 2458  . enoxaparin (LOVENOX) injection 40 mg  40 mg Subcutaneous Q24H Alexis Hugelmeyer, DO   40 mg at 06/01/16 2209  . feeding supplement  (ENSURE ENLIVE) (ENSURE ENLIVE) liquid 237 mL  237 mL Oral BID BM Alexis Hugelmeyer, DO   237 mL at 06/02/16 1000  . fluticasone (FLONASE) 50 MCG/ACT nasal spray 2 spray  2 spray Each Nare Daily PRN Alexis Hugelmeyer, DO      . gabapentin (NEURONTIN) capsule 200 mg  200 mg Oral Q1400 Alexis Hugelmeyer, DO   200 mg at 06/01/16 1330  . gabapentin (NEURONTIN) capsule 300 mg  300 mg Oral q morning - 10a Alexis Hugelmeyer, DO   300 mg at 06/02/16 0998  . gabapentin (NEURONTIN) capsule 400 mg  400 mg Oral QHS Alexis Hugelmeyer, DO   400 mg at 06/01/16 2207  . ipratropium (ATROVENT) nebulizer solution 0.5 mg  0.5 mg Nebulization Q6H PRN Alexis Hugelmeyer, DO      . isosorbide mononitrate (IMDUR) 24 hr tablet 30 mg  30 mg Oral Daily Alexis Hugelmeyer, DO   30 mg at 06/02/16 3382  . levothyroxine (SYNTHROID, LEVOTHROID) tablet 50 mcg  50 mcg Oral QAC breakfast Alexis Hugelmeyer, DO   50  mcg at 06/02/16 0823  . losartan (COZAAR) tablet 25 mg  25 mg Oral Daily Alexis Hugelmeyer, DO   25 mg at 06/02/16 2297  . meropenem (MERREM) IVPB SOLR 1 g  1 g Intravenous Q12H Fritzi Mandes, MD 100 mL/hr at 06/02/16 1105 1 g at 06/02/16 1105  . mometasone-formoterol (DULERA) 100-5 MCG/ACT inhaler 2 puff  2 puff Inhalation BID Alexis Hugelmeyer, DO   2 puff at 06/02/16 0825  . montelukast (SINGULAIR) tablet 10 mg  10 mg Oral QHS Alexis Hugelmeyer, DO   10 mg at 06/01/16 2206  . ondansetron (ZOFRAN-ODT) disintegrating tablet 4 mg  4 mg Oral Q8H PRN Alexis Hugelmeyer, DO      . oxyCODONE (Oxy IR/ROXICODONE) immediate release tablet 5 mg  5 mg Oral Q6H PRN Alexis Hugelmeyer, DO   5 mg at 06/01/16 2207  . pantoprazole (PROTONIX) EC tablet 40 mg  40 mg Oral Daily Alexis Hugelmeyer, DO   40 mg at 06/02/16 0824  . polyethylene glycol (MIRALAX / GLYCOLAX) packet 17 g  17 g Oral Daily PRN Alexis Hugelmeyer, DO      . potassium chloride (K-DUR,KLOR-CON) CR tablet 10 mEq  10 mEq Oral TID Alexis Hugelmeyer, DO   10 mEq at 06/02/16 9892  .  senna (SENOKOT) tablet 17.2 mg  2 tablet Oral BID Alexis Hugelmeyer, DO   17.2 mg at 06/02/16 0823  . sodium chloride flush (NS) 0.9 % injection 3 mL  3 mL Intravenous Q12H Alexis Hugelmeyer, DO   3 mL at 06/02/16 0830  . sucralfate (CARAFATE) tablet 1 g  1 g Oral BID Alexis Hugelmeyer, DO   1 g at 06/02/16 1194  . ticagrelor (BRILINTA) tablet 90 mg  90 mg Oral BID Alexis Hugelmeyer, DO   90 mg at 06/02/16 1740  . tiotropium [facility]) inhalation capsule 18 mcg  18 mcg Inhalation Daily Alexis Hugelmeyer, DO   18 mcg at 06/02/16 0824  . torsemide (DEMADEX) tablet 20 mg  20 mg Oral BID Alexis Hugelmeyer, DO   20 mg at 06/02/16 8144     Discharge Medications: Please see discharge summary for a list of discharge medications.  Relevant Imaging Results:  Relevant Lab Results:   Additional Information SSN [id number]  Ross Ludwig, [place]

## 2016-06-02 NOTE — Clinical Social Work Note (Addendum)
CSW spoke to patient's daughter Berdine Addison [phone], to discuss SNF placement options.  Patient's daughter is in agreement to going to SNF again, CSW awaiting bed offers.  CSW contacted [facility] which was her last placement and patient used 10 days at her last stay.  Patient's daughter stated she was going to make some phone calls and then contact CSW back about bed offer decision.  CSW to continue to follow patient's progress throughout discharge planning.  11:55am  CSW received phone call back from patient's daughter she would like to go to [facility], CSW contacted SNF who will review patient's information and determine if they can accept patient for short term rehab and make decision.  CSW to continue to follow patient's progress.  12:15pm  CSW received [facility] call back from [facility] can not [facility] patient, CSW updated patient's daughter Berdine Addison, she stated she will call CSW back again with different choice.  CSW explained to patient's daughter that insurance may take a couple of days to approve and to call back as soon as possible.  CSW awaiting call back from daughter.  5:00pm  CSW spoke to patient's daughter and she would like to go to [facility] for short term rehab.  CSW contacted [facility] and left a message informing them that patient would like to return for rehab and to begin insurance authorization.  CSW to continue to follow patient's progress throughout discharge planning.  Jones Broom. [place], MSW, [place]  06/02/2016 11:49 AM

## 2016-06-02 NOTE — Progress Notes (Signed)
Lactic acid level at 1.9. MD aware.

## 2016-06-03 DIAGNOSIS — R7881 Bacteremia: Secondary | ICD-10-CM | POA: Diagnosis not present

## 2016-06-03 DIAGNOSIS — G20 Parkinson's disease: Secondary | ICD-10-CM | POA: Diagnosis not present

## 2016-06-03 DIAGNOSIS — E876 Hypokalemia: Secondary | ICD-10-CM | POA: Diagnosis not present

## 2016-06-03 DIAGNOSIS — R7989 Other specified abnormal findings of blood chemistry: Secondary | ICD-10-CM | POA: Diagnosis not present

## 2016-06-03 DIAGNOSIS — I422 Other hypertrophic cardiomyopathy: Secondary | ICD-10-CM | POA: Diagnosis not present

## 2016-06-03 DIAGNOSIS — I2510 Atherosclerotic heart disease of native coronary artery without angina pectoris: Secondary | ICD-10-CM | POA: Diagnosis present

## 2016-06-03 DIAGNOSIS — D649 Anemia, unspecified: Secondary | ICD-10-CM | POA: Diagnosis not present

## 2016-06-03 DIAGNOSIS — Z515 Encounter for palliative care: Secondary | ICD-10-CM | POA: Diagnosis not present

## 2016-06-03 DIAGNOSIS — Z96652 Presence of left artificial knee joint: Secondary | ICD-10-CM | POA: Diagnosis present

## 2016-06-03 DIAGNOSIS — D638 Anemia in other chronic diseases classified elsewhere: Secondary | ICD-10-CM | POA: Diagnosis present

## 2016-06-03 DIAGNOSIS — F325 Major depressive disorder, single episode, in full remission: Secondary | ICD-10-CM | POA: Diagnosis not present

## 2016-06-03 DIAGNOSIS — Z90710 Acquired absence of both cervix and uterus: Secondary | ICD-10-CM | POA: Diagnosis not present

## 2016-06-03 DIAGNOSIS — R6889 Other general symptoms and signs: Secondary | ICD-10-CM | POA: Diagnosis not present

## 2016-06-03 DIAGNOSIS — M5136 Other intervertebral disc degeneration, lumbar region: Secondary | ICD-10-CM | POA: Diagnosis not present

## 2016-06-03 DIAGNOSIS — Z96659 Presence of unspecified artificial knee joint: Secondary | ICD-10-CM | POA: Diagnosis not present

## 2016-06-03 DIAGNOSIS — R0689 Other abnormalities of breathing: Secondary | ICD-10-CM | POA: Diagnosis not present

## 2016-06-03 DIAGNOSIS — B9620 Unspecified Escherichia coli [E. coli] as the cause of diseases classified elsewhere: Secondary | ICD-10-CM | POA: Diagnosis not present

## 2016-06-03 DIAGNOSIS — I509 Heart failure, unspecified: Secondary | ICD-10-CM | POA: Diagnosis not present

## 2016-06-03 DIAGNOSIS — D72829 Elevated white blood cell count, unspecified: Secondary | ICD-10-CM | POA: Diagnosis not present

## 2016-06-03 DIAGNOSIS — G9341 Metabolic encephalopathy: Secondary | ICD-10-CM | POA: Diagnosis not present

## 2016-06-03 DIAGNOSIS — N390 Urinary tract infection, site not specified: Secondary | ICD-10-CM | POA: Diagnosis not present

## 2016-06-03 DIAGNOSIS — I481 Persistent atrial fibrillation: Secondary | ICD-10-CM | POA: Diagnosis not present

## 2016-06-03 DIAGNOSIS — Z66 Do not resuscitate: Secondary | ICD-10-CM | POA: Diagnosis not present

## 2016-06-03 DIAGNOSIS — R4182 Altered mental status, unspecified: Secondary | ICD-10-CM | POA: Diagnosis not present

## 2016-06-03 DIAGNOSIS — C84A0 Cutaneous T-cell lymphoma, unspecified, unspecified site: Secondary | ICD-10-CM | POA: Diagnosis not present

## 2016-06-03 DIAGNOSIS — Z87891 Personal history of nicotine dependence: Secondary | ICD-10-CM | POA: Diagnosis not present

## 2016-06-03 DIAGNOSIS — F419 Anxiety disorder, unspecified: Secondary | ICD-10-CM | POA: Diagnosis not present

## 2016-06-03 DIAGNOSIS — I110 Hypertensive heart disease with heart failure: Secondary | ICD-10-CM | POA: Diagnosis not present

## 2016-06-03 DIAGNOSIS — A419 Sepsis, unspecified organism: Secondary | ICD-10-CM | POA: Diagnosis not present

## 2016-06-03 DIAGNOSIS — E785 Hyperlipidemia, unspecified: Secondary | ICD-10-CM | POA: Diagnosis not present

## 2016-06-03 DIAGNOSIS — Z7189 Other specified counseling: Secondary | ICD-10-CM | POA: Diagnosis not present

## 2016-06-03 DIAGNOSIS — I959 Hypotension, unspecified: Secondary | ICD-10-CM | POA: Diagnosis not present

## 2016-06-03 DIAGNOSIS — E43 Unspecified severe protein-calorie malnutrition: Secondary | ICD-10-CM | POA: Diagnosis not present

## 2016-06-03 DIAGNOSIS — R0902 Hypoxemia: Secondary | ICD-10-CM | POA: Diagnosis not present

## 2016-06-03 DIAGNOSIS — Z7401 Bed confinement status: Secondary | ICD-10-CM | POA: Diagnosis not present

## 2016-06-03 DIAGNOSIS — Z833 Family history of diabetes mellitus: Secondary | ICD-10-CM | POA: Diagnosis not present

## 2016-06-03 DIAGNOSIS — R52 Pain, unspecified: Secondary | ICD-10-CM | POA: Diagnosis not present

## 2016-06-03 DIAGNOSIS — J449 Chronic obstructive pulmonary disease, unspecified: Secondary | ICD-10-CM | POA: Diagnosis not present

## 2016-06-03 DIAGNOSIS — A4151 Sepsis due to Escherichia coli [E. coli]: Secondary | ICD-10-CM | POA: Diagnosis not present

## 2016-06-03 DIAGNOSIS — E039 Hypothyroidism, unspecified: Secondary | ICD-10-CM | POA: Diagnosis not present

## 2016-06-03 DIAGNOSIS — R112 Nausea with vomiting, unspecified: Secondary | ICD-10-CM | POA: Diagnosis not present

## 2016-06-03 DIAGNOSIS — I5032 Chronic diastolic (congestive) heart failure: Secondary | ICD-10-CM | POA: Diagnosis not present

## 2016-06-03 DIAGNOSIS — R410 Disorientation, unspecified: Secondary | ICD-10-CM | POA: Diagnosis present

## 2016-06-03 DIAGNOSIS — F329 Major depressive disorder, single episode, unspecified: Secondary | ICD-10-CM | POA: Diagnosis not present

## 2016-06-03 DIAGNOSIS — J439 Emphysema, unspecified: Secondary | ICD-10-CM | POA: Diagnosis not present

## 2016-06-03 DIAGNOSIS — Z7982 Long term (current) use of aspirin: Secondary | ICD-10-CM | POA: Diagnosis not present

## 2016-06-03 DIAGNOSIS — E871 Hypo-osmolality and hyponatremia: Secondary | ICD-10-CM | POA: Diagnosis not present

## 2016-06-03 DIAGNOSIS — Z823 Family history of stroke: Secondary | ICD-10-CM | POA: Diagnosis not present

## 2016-06-03 DIAGNOSIS — Z8249 Family history of ischemic heart disease and other diseases of the circulatory system: Secondary | ICD-10-CM | POA: Diagnosis not present

## 2016-06-03 DIAGNOSIS — R748 Abnormal levels of other serum enzymes: Secondary | ICD-10-CM | POA: Diagnosis not present

## 2016-06-03 DIAGNOSIS — M797 Fibromyalgia: Secondary | ICD-10-CM | POA: Diagnosis present

## 2016-06-03 DIAGNOSIS — I480 Paroxysmal atrial fibrillation: Secondary | ICD-10-CM | POA: Diagnosis present

## 2016-06-03 LAB — CULTURE, BLOOD (ROUTINE X 2)
SPECIAL REQUESTS: ADEQUATE
Special Requests: ADEQUATE

## 2016-06-03 LAB — URINE CULTURE: Culture: >=100000 — AB

## 2016-06-03 MED ORDER — CEPHALEXIN 500 MG PO CAPS: 500.0000 mg | ORAL_CAPSULE | Freq: Two times a day (BID) | ORAL | 0 refills | Status: AC

## 2016-06-03 MED ORDER — CEPHALEXIN 500 MG PO CAPS
500.0000 mg | ORAL_CAPSULE | Freq: Two times a day (BID) | ORAL | Status: DC
Start: 2016-06-03 — End: 2016-06-03
  Administered 2016-06-03: 500 mg via ORAL
  Filled 2016-06-03: qty 1

## 2016-06-03 NOTE — Progress Notes (Signed)
Physical Therapy Treatment Patient Details Name: Hailey [name] MRN: [id number] DOB: July 30, 1932 Today's Date: 06/03/2016    History of Present Illness Pt is an 83 y.o.femalewith a known history of Parkinson's Dz, anemia, anxiety, arthritis, CHF, COPD, DVT status post filter, GERD, hypertension, hyperlipidemia, hypertropic cardiomyopathy, paroxysmal atrial fibrillation on full dose aspirin secondary to history of GI bleed, PE, peptic ulcer diseasepresents to the emergency department for evaluation of AMS. She was increasingly confused, weak and fatigued. Of note patient was admitted on 05/01/2016 with ST elevation MI for which she had a drug-eluting stent placed in the LAD.  Admitted for Sepsis secondary to UTI, mild hyponatremia and hypokalemia, elevated troponin likely secondary demand ischemia.    PT Comments    Pt states she has been able to get up to the [facility] with +1 assist over the last 24 hours, but otherwise has not done much out of the bed.  She states she is too tired from working with OT to try walking/getting to recliner but was willing to do some supine bed exercises.  She showed good effort and fair strength with the effort as she was able to tolerate some resistance with most acts.  Pt did need frequent brief rest breaks secondary to fatigue t/o the session.    Follow Up Recommendations  SNF     Equipment Recommendations       Recommendations for Other Services       Precautions / Restrictions Precautions Precautions: Fall Restrictions Weight Bearing Restrictions: No    Mobility  Bed Mobility               General bed mobility comments: pt deferred all mobility, too tired from OT to attempt getting up  Transfers                    Ambulation/Gait                 Stairs            Wheelchair Mobility    Modified Rankin (Stroke Patients Only)       Balance                                            Cognition  Arousal/Alertness: Awake/alert Behavior During Therapy: WFL for tasks assessed/performed Overall Cognitive Status: Within Functional Limits for tasks assessed                                        Exercises General Exercises - Lower Extremity Ankle Circles/Pumps: AROM;10 reps Quad Sets: Strengthening;10 reps Gluteal Sets: Strengthening;10 reps Heel Slides: Strengthening;10 reps Hip ABduction/ADduction: Strengthening;10 reps Straight Leg Raises: AROM;10 reps    General Comments        Pertinent Vitals/Pain Pain Assessment: No/denies pain    Home Living Family/patient expects to be discharged to:: Other (Comment)               Additional Comments: Lives at [place] and expects to go to SNF    Prior Function Level of Independence: Independent with assistive device(s)      Comments: Pt Mod I with amb with rollator with no fall history, Ind with ADLs, ILF provides meals   PT Goals (current goals can now be found in  the care plan section) Progress towards PT goals: Progressing toward goals    Frequency    Min 2X/week      PT Plan Current plan remains appropriate    Co-evaluation             End of Session   Activity Tolerance: Patient limited by fatigue Patient left: in bed;with bed alarm set;with call bell/phone within reach   PT Visit Diagnosis: Difficulty in walking, not elsewhere classified (R26.2);Muscle weakness (generalized) (M62.81)     Time: 0370-4888 PT Time Calculation (min) (ACUTE ONLY): 14 min  Charges:  $Therapeutic Exercise: 8-22 mins                    G Codes:       Kreg Shropshire, DPT 06/03/2016, 12:38 PM

## 2016-06-03 NOTE — Clinical Social Work Placement (Signed)
   CLINICAL SOCIAL WORK PLACEMENT  NOTE  Date:  06/03/2016  Patient Details  Name: Hailey [name] MRN: [id number] Date of Birth: 02-Dec-1932  Clinical Social Work is seeking post-discharge placement for this patient at the [place] level of care (*CSW will initial, date and re-position this form in  chart as items are completed):  Yes   Patient/family provided with [place] Work Department's list of facilities offering this level of care within the geographic area requested by the patient (or if unable, by the patient's family).  Yes   Patient/family informed of their freedom to choose among providers that offer the needed level of care, that participate in Medicare, Medicaid or managed care program needed by the patient, have an available bed and are willing to accept the patient.  Yes   Patient/family informed of 's ownership interest in [facility] and [facility], as well as of the fact that they are under no obligation to receive care at these facilities.  PASRR submitted to EDS on 06/03/16     PASRR number received on       Existing PASRR number confirmed on 06/03/16     FL2 transmitted to all facilities in geographic area requested by pt/family on 06/03/16     FL2 transmitted to all facilities within larger geographic area on       Patient informed that his/her managed care company has contracts with or will negotiate with certain facilities, including the following:        Yes   Patient/family informed of bed offers received.  Patient chooses bed at [facility]     Physician recommends and patient chooses bed at      Patient to be transferred to [facility] on 06/03/16.  Patient to be transferred to facility by [facility]     Patient family notified on 06/03/16 of transfer.  Name of family member notified:  Lysle Rubens daughter.     PHYSICIAN       Additional Comment:     _______________________________________________ Ross Ludwig, LCSWA 06/03/2016, 4:13 PM

## 2016-06-03 NOTE — Clinical Social Work Note (Signed)
Clinical Social Work Assessment  Patient Details  Name: Hailey [name] MRN: [id number] Date of Birth: May 12, 1932  Date of referral:  06/03/16               Reason for consult:  Facility Placement                Permission sought to share information with:  Facility Sport and exercise psychologist, Family Supports Permission granted to share information::  Yes, Verbal Permission Granted  Name::     SNF admissions  Agency::     Relationship::     Contact Information:     Housing/Transportation Living arrangements for the past 2 months:  [place], [place] of Information:  Patient, Adult Children Patient Interpreter Needed:  None Criminal Activity/Legal Involvement Pertinent to Current Situation/Hospitalization:  No - Comment as needed Significant Relationships:  Adult Children, Other Family Members Lives with:  Self Do you feel safe going back to the place where you live?  No Need for family participation in patient care:  No (Coment)  Care giving concerns:  Patient feels she need short term rehab before she is able to return back home.   Social Worker assessment / plan:  Patient is an 83 year old female who is alert and oriented x4 and able to express her feelings.  Patient was just recently discharged from SNF for rehab and then went home with home health.  However patient was readmitted, and is in agreement with going to SNF again.  Patient was reminded how insurance will pay for her stay and what to expect at SNF.  Patient did not express any other questions and gave CSW permission to begin bed search process.   Employment status:  Retired Nurse, adult PT Recommendations:  [place] / Referral to community resources:  [place]  Patient/Family's Response to care:  Patient is in agreement to going to SNF for short term rehab.  Patient/Family's Understanding of and Emotional Response to  Diagnosis, Current Treatment, and Prognosis:  Patient is hopeful that she will not have to be in SNF very long.  Emotional Assessment Appearance:  Appears stated age Attitude/Demeanor/Rapport:    Affect (typically observed):  Appropriate, Calm Orientation:  Oriented to Self, Oriented to Place, Oriented to  Time, Oriented to Situation Alcohol / Substance use:  Not Applicable Psych involvement (Current and /or in the community):  No (Comment)  Discharge Needs  Concerns to be addressed:  Lack of Support Readmission within the last 30 days:  Yes (05-06-16 to [facility] Current discharge risk:  None Barriers to Discharge:  No Barriers Identified   Ross Ludwig, LCSWA 06/03/2016, 4:05 PM

## 2016-06-03 NOTE — Evaluation (Signed)
Occupational Therapy Evaluation Patient Details Name: Hailey [name] MRN: [id number] DOB: 13-Sep-1932 Today's Date: 06/03/2016    History of Present Illness Pt is an 83 y.o.femalewith a known history of Parkinson's Dz, anemia, anxiety, arthritis, CHF, COPD, DVT status post filter, GERD, hypertension, hyperlipidemia, hypertropic cardiomyopathy, paroxysmal atrial fibrillation on full dose aspirin alone secondary to history of GI bleed, PE, peptic ulcer diseasepresents to the emergency department for evaluation of AMS. Patient was in a usual state of health until today when she was increasingly confused, weak and fatigued. Of note patient was admitted on 05/01/2016 with ST elevation MI for which she had a drug-eluting stent placed in the LAD. Otherwise there has been no change in status.  Assessment includes Sepsis secondary to UTI, mild hyponatremia and hypokalemia, elevated troponin likely secondary demand ischemia.   Clinical Impression   Pt is 83 year old female who presents to [facility] hospital with confusion, muscle weakness and AMS when her daughter came to check on her at [place].  She was admitted recently on 05/01/16 for an MI and had a stent placed.  She has SOB upon any exertion including sitting EOB or when getting up to [facility].  She requires min assist for LB dressing tasks and ADLs overall due to needing frequent rest breaks for SOB.  Reviewed energy conservation tech and discussed [place] of AD.  She also has arthritis in B hands and given form utensil holders to hold fork, spoon and toothbrush.  She would also benefit from dycem and rocker knife, LH shoe horn and LH sponge.  She has a Secondary school teacher. Pt would benefit from skilled OT services to increase independence in ADLs, education in energy conservation techniques, pursed lip breathing and recommendations for home modifications to increase safety and prevent falls.  Rec SNF after discharge from hospital.    Follow Up Recommendations  SNF     Equipment Recommendations   ([place] shoe horn, LH sponge, foam utensil holders (given))    Recommendations for Other Services       Precautions / Restrictions Precautions Precautions: Fall Restrictions Weight Bearing Restrictions: No      Mobility Bed Mobility                  Transfers                      Balance                                           ADL either performed or assessed with clinical judgement   ADL Overall ADL's : Needs assistance/impaired Eating/Feeding: Minimal assistance;Set up Eating/Feeding Details (indicate cue type and reason): Unable to cut up meat due to arthritis and given 2 red foam utensil holders to assist with holding utensils Grooming: Wash/dry hands;Wash/dry face;Oral care;Applying deodorant;Brushing hair;Independent;Set up           Upper Body Dressing : Set up;Independent Upper Body Dressing Details (indicate cue type and reason): freq breaks due to SOB  Lower Body Dressing: Set up;Minimal assistance;With adaptive equipment;Sit to/from stand Lower Body Dressing Details (indicate cue type and reason): freq breaks for SOB                     Vision         Perception     Praxis  Pertinent Vitals/Pain Pain Assessment: No/denies pain     Hand Dominance Right   Extremity/Trunk Assessment Upper Extremity Assessment Upper Extremity Assessment: Generalized weakness   Lower Extremity Assessment Lower Extremity Assessment: Defer to PT evaluation       Communication Communication Communication: No difficulties   Cognition Arousal/Alertness: Awake/alert Behavior During Therapy: WFL for tasks assessed/performed Overall Cognitive Status: Within Functional Limits for tasks assessed                                     General Comments       Exercises     Shoulder Instructions      Home Living Family/patient expects to be discharged to:: Other (Comment)                                  Additional Comments: Lives at [place] and expects to go to SNF      Prior Functioning/Environment Level of Independence: Independent with assistive device(s)        Comments: Pt Mod I with amb with rollator with no fall history, Ind with ADLs, ILF provides meals        OT Problem List: Decreased activity tolerance;Impaired balance (sitting and/or standing);Cardiopulmonary status limiting activity      OT Treatment/Interventions: Self-care/ADL training;Therapeutic activities;Therapeutic exercise;DME and/or AE instruction;Energy conservation;Patient/family education    OT Goals(Current goals can be found in the care plan section) Acute Rehab OT Goals Patient Stated Goal: "to be able to breathe better so I can do more" OT Goal Formulation: With patient Time For Goal Achievement: 06/17/16 Potential to Achieve Goals: Good ADL Goals Pt Will Perform Lower Body Dressing: with set-up;with min assist;with adaptive equipment;sit to/from stand (using energy Insurance underwriter and A/D and FWW) Pt Will Transfer to Toilet: with set-up;with min guard assist;stand pivot transfer (with FWW and energy Insurance underwriter)  OT Frequency: Min 1X/week   Barriers to D/C:            Co-evaluation              End of Session    Activity Tolerance: Patient limited by fatigue (due to SOB--pt is motivated to get better and participate) Patient left: in bed;with call bell/phone within reach;with bed alarm set  OT Visit Diagnosis: Muscle weakness (generalized) (M62.81);Unsteadiness on feet (R26.81)                Time: 1110-1155 OT Time Calculation (min): 45 min Charges:  OT General Charges $OT Visit: 1 Procedure OT Evaluation $OT Eval Low Complexity: 1 Procedure OT Treatments $Self Care/Home Management : 8-22 mins $Therapeutic Activity: 8-22 mins G-Codes:     Chrys Racer, OTR/L ascom [phone] 06/03/16, 12:15 PM

## 2016-06-03 NOTE — Care Management Important Message (Signed)
Important Message  Patient Details  Name: Hailey [name] MRN: [id number] Date of Birth: [birth year]   Medicare Important Message Given:  Yes    Katrina Stack, RN 06/03/2016, 8:21 AM

## 2016-06-03 NOTE — Clinical Social Work Note (Signed)
Patient to be d/c'ed today to [facility].  Patient and family agreeable to plans will transport via ems RN to call report to [phone].  Callyn Severtson, MSW, LCSWA [phone]  

## 2016-06-03 NOTE — Consult Note (Signed)
   [facility] CM Inpatient Consult   06/03/2016  GAIGE FUSSNER 05-13-1932 [id number]   Patient screened for potential [facility] Management services. Patient is eligible for [facility]. Electronic medical record reveals patient's discharge plan is SNF and there were no identifiable [facility] care management needs. [facility] Care Management services not appropriate at this time. If patient's post hospital needs change please place a [facility] Care Management consult. For questions please contact:   Brithney Bensen RN, [facility] Liaison  [phone]) Business Mobile [phone]) Toll free office

## 2016-06-03 NOTE — Progress Notes (Signed)
Discharge instructions given to patient. No distress at this time. Patient denies further questions. Report called to [facility]. Currently awaiting transportation via EMS.

## 2016-06-03 NOTE — Discharge Summary (Signed)
[facility] NAME: Hailey [name]    MR#:  [id number]  DATE OF BIRTH:  [birth year]  DATE OF ADMISSION:  05/31/2016 ADMITTING PHYSICIAN: Harvie Bridge, DO  DATE OF DISCHARGE: 06/03/16  PRIMARY CARE PHYSICIAN: BABAOFF, MARC E, MD    ADMISSION DIAGNOSIS:  Urinary tract infection with hematuria, site unspecified [N39.0, R31.9] Altered mental status, unspecified altered mental status type [R41.82]  DISCHARGE DIAGNOSIS:  E Coli Sepsis  SECONDARY DIAGNOSIS:   Past Medical History:  Diagnosis Date  . Anemia    Chronic  . Anxiety   . Arthritis   . Asthma   . Atrophic vaginitis   . Cerebral aneurysm    Hx of  . CHF (congestive heart failure) ([place])   . COPD (chronic obstructive pulmonary disease) ([place])   . Diverticulitis   . DVT (deep vein thrombosis) in pregnancy ([place]) 09/2008   a. LLE, recurrent hx, has IVC filter. Not on coumadin now with history of GI bleeding  . Fibrocystic disease of breast   . Fibromyalgia   . GERD (gastroesophageal reflux disease)   . Gout   . Gross hematuria   . Heart murmur   . History of colonoscopy   . Hyperlipidemia   . Hypertension   . Hypertrophic cardiomyopathy ([place])    a. Echo 4/09 with EF 70-75%, asymmetricy basal septal hypertrophy, mild MR without systolic anterior motion of the mitral valve, LVOT gradient to 130 mmHg with Valsalva b. Echo 7/13: EF 60-65%, mild focal basal septal hypertrophy, no significant LVOT gradient, no MV SAM c. Echo 2/15: EF 55-60%, HOCM, resting LVOT gradient 29 mmHg, Valsalva LVOT gradient > 140 mmHg, mild LVH, mild TR, elevated PASP  . Incomplete bladder emptying   . Migraine headache    Hx of  . Obesity   . OSA (obstructive sleep apnea)    a. Intolerant to CPAP, wears 2L via n/c  . Osteoarthritis    Hx of left TKR  . PAF (paroxysmal atrial fibrillation) ([place])    a. Full-dose ASA alone 2/2 h/o GIB.  Marland Kitchen PAT (paroxysmal atrial tachycardia) ([place])   .  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 Pneumonia   . PUD (peptic ulcer disease)    With GI bleeding  . Pulmonary embolism ([place])   . Tremor     HOSPITAL COURSE:  83 y.o.femalewith a history of Anemia, anxiety, arthritis, CHF, COPD, DVT status post filter, GERD, hypertension, hyperlipidemia, hypertropic cardiomyopathy, paroxysmal atrial fibrillation on full dose aspirin alone secondary to history of GI bleed, PE, peptic ulcer diseasenow being admitted with:  #.  E coli Sepsis secondary to UTI - IV antibiotics: Meropenem--change to oralkeflex - recieved IV fluid hydration - Follow up blood cultures Ecoli 1/2 -pansensitive  #. Hyponatremia, mild -Continue home potassium  #. Hypokalemia, mild -replace orally  #. Elevated troponin, likely secondary to demand ischemia however  -Continue aspirin, Lipitor, Imdur, Brilinta  #. History of Parkinson's - Continue Sinemet  #. History of chronic back pain - Continue Valium as needed, oxycodone  #. History of depression - Continue Cymbalta  #. History of CHF - Continue Cozaar, Demadex  #. History of peptic ulcer disease -Continue Carafate  #. History of hypothyroidism -Continue Synthroid  #.History of atrial fibrillation -Continue amiodarone  #. History of COPD -Continue Dulera, Singulair and Flonase, Spiriva  #. History of GERD -Continue Protonix  To [facility]. Pt doing well Spoke with dter Ms Eulas Post  and she agrees with the plan.  CONSULTS OBTAINED:    DRUG ALLERGIES:   Allergies  Allergen Reactions  . Aspirin Other (See Comments)    Reaction:  GI bleeding   . Codeine Nausea And Vomiting  . Compazine [Prochlorperazine] Other (See Comments)    Confusion  . Ketorolac Tromethamine Other (See Comments)    Reaction:  Headache   . Nsaids Other (See Comments)    Reaction:  GI bleeding   . Phenazopyridine Hcl Other (See Comments)    Reaction:  Vision problems    . Macrodantin [Nitrofurantoin Macrocrystal] Rash    DISCHARGE MEDICATIONS:   Current Discharge Medication List    START taking these medications   Details  cephALEXin (KEFLEX) 500 MG capsule Take 1 capsule (500 mg total) by mouth every 12 (twelve) hours. Qty: 14 capsule, Refills: 0      CONTINUE these medications which have NOT CHANGED   Details  acetaminophen (TYLENOL) 325 MG tablet Take 2 tablets (650 mg total) by mouth every 6 (six) hours as needed for mild pain or moderate pain (temp > 101.5).    albuterol (PROVENTIL HFA;VENTOLIN HFA) 108 (90 Base) MCG/ACT inhaler Inhale 1 puff into the lungs every 6 (six) hours as needed for wheezing or shortness of breath.    albuterol (PROVENTIL) (2.5 MG/3ML) 0.083% nebulizer solution Take 3 mLs (2.5 mg total) by nebulization every 4 (four) hours as needed for wheezing or shortness of breath. Qty: 75 mL, Refills: 12    amiodarone (PACERONE) 200 MG tablet Take 1 tablet (200 mg total) by mouth daily. Pt is able to take an additional tablet if needed for breakthrough arrhythmias. Qty: 30 tablet, Refills: 6    aspirin EC 81 MG EC tablet Take 1 tablet (81 mg total) by mouth daily. Qty: 30 tablet, Refills: 0    atorvastatin (LIPITOR) 80 MG tablet Take 1 tablet (80 mg total) by mouth daily at 6 PM. Qty: 30 tablet, Refills: 0    carbidopa-levodopa (SINEMET IR) 25-250 MG tablet Take 1 tablet by mouth 4 (four) times daily. Qty: 120 tablet, Refills: 0    chlorhexidine (PERIDEX) 0.12 % solution 15 mLs by Mouth Rinse route 2 (two) times daily.    diazepam (VALIUM) 2 MG tablet Take 1 tablet (2 mg total) by mouth every 6 (six) hours as needed for anxiety (or back pain). Qty: 10 tablet, Refills: 0    dimenhyDRINATE (DRAMAMINE) 50 MG tablet Take 25-50 mg by mouth every 6 (six) hours as needed for dizziness.     docusate sodium (COLACE) 100 MG capsule Take 100 mg by mouth 2 (two) times daily as needed for mild constipation.    DULoxetine (CYMBALTA)  30 MG capsule Take 3 capsules (90 mg total) by mouth daily. Qty: 30 capsule, Refills: 0    feeding supplement, ENSURE ENLIVE, (ENSURE ENLIVE) LIQD Take 237 mLs by mouth 2 (two) times daily between meals. Qty: 237 mL, Refills: 12    fluticasone (FLONASE) 50 MCG/ACT nasal spray Place 2 sprays into both nostrils daily as needed for rhinitis.    gabapentin (NEURONTIN) 100 MG capsule Take 200-400 mg by mouth 3 (three) times daily. Pt takes three capsules in the morning, two capsules in the evening, and four capsules at bedtime.    isosorbide mononitrate (IMDUR) 30 MG 24 hr tablet Take 1 tablet (30 mg total) by mouth daily. Qty: 30 tablet, Refills: 0    Levothyroxine Sodium 75 MCG CAPS Take 50 mcg by mouth daily before breakfast.  losartan (COZAAR) 25 MG tablet Take 25 mg by mouth daily.    mometasone-formoterol (DULERA) 100-5 MCG/ACT AERO Inhale 2 puffs into the lungs 2 (two) times daily. Qty: 1 Inhaler, Refills: 0    montelukast (SINGULAIR) 10 MG tablet Take 10 mg by mouth at bedtime.    ondansetron (ZOFRAN-ODT) 4 MG disintegrating tablet Take 4 mg by mouth every 8 (eight) hours as needed for nausea or vomiting.    oxyCODONE (OXY IR/ROXICODONE) 5 MG immediate release tablet Take 1 tablet (5 mg total) by mouth every 6 (six) hours as needed for moderate pain or severe pain. Qty: 5 tablet, Refills: 0    pantoprazole (PROTONIX) 40 MG tablet Take 40 mg by mouth daily.    polyethylene glycol (MIRALAX / GLYCOLAX) packet Take 17 g by mouth daily as needed for moderate constipation.     potassium chloride (K-DUR,KLOR-CON) 10 MEQ tablet Take 1 tablet (10 mEq total) by mouth 3 (three) times daily. Qty: 90 tablet, Refills: 1    senna (SENOKOT) 8.6 MG TABS tablet Take 2 tablets (17.2 mg total) by mouth 2 (two) times daily. Qty: 120 each, Refills: 0    sucralfate (CARAFATE) 1 g tablet Take 1 g by mouth 2 (two) times daily.    ticagrelor (BRILINTA) 90 MG TABS tablet Take 1 tablet (90 mg total)  by mouth 2 (two) times daily. Qty: 60 tablet, Refills: 0    tiotropium (SPIRIVA) 18 MCG inhalation capsule Place 18 mcg into inhaler and inhale daily.    torsemide (DEMADEX) 20 MG tablet Take 1 tablet (20 mg total) by mouth 2 (two) times daily. Qty: 90 tablet, Refills: 3    triamcinolone ointment (KENALOG) 0.1 % Apply 1 application topically 2 (two) times daily as needed (for rash).         If you experience worsening of your admission symptoms, develop shortness of breath, life threatening emergency, suicidal or homicidal thoughts you must seek medical attention immediately by calling 911 or calling your MD immediately  if symptoms less severe.  You Must read complete instructions/literature along with all the possible adverse reactions/side effects for all the Medicines you take and that have been prescribed to you. Take any new Medicines after you have completely understood and accept all the possible adverse reactions/side effects.   Please note  You were cared for by a hospitalist during your hospital stay. If you have any questions about your discharge medications or the care you received while you were in the hospital after you are discharged, you can call the unit and asked to speak with the hospitalist on call if the hospitalist that took care of you is not available. Once you are discharged, your primary care physician will handle any further medical issues. Please note that NO REFILLS for any discharge medications will be authorized once you are discharged, as it is imperative that you return to your primary care physician (or establish a relationship with a primary care physician if you do not have one) for your aftercare needs so that they can reassess your need for medications and monitor your lab values. Today   SUBJECTIVE   Doing well  VITAL SIGNS:  Blood pressure (!) 103/45, pulse (!) 59, temperature 97.3 F (36.3 C), temperature source Oral, resp. rate 16, height 5\' 2"   (1.575 m), weight 71.8 kg (158 lb 6.4 oz), SpO2 97 %.  I/O:   Intake/Output Summary (Last 24 hours) at 06/03/16 1029 Last data filed at 06/03/16 1029  Gross per 24  hour  Intake             1140 ml  Output             2250 ml  Net            -1110 ml    PHYSICAL EXAMINATION:  GENERAL:  83 y.o.-year-old patient lying in the bed with no acute distress.  EYES: Pupils equal, round, reactive to light and accommodation. No scleral icterus. Extraocular muscles intact.  HEENT: Head atraumatic, normocephalic. Oropharynx and nasopharynx clear.  NECK:  Supple, no jugular venous distention. No thyroid enlargement, no tenderness.  LUNGS: Normal breath sounds bilaterally, no wheezing, rales,rhonchi or crepitation. No use of accessory muscles of respiration.  CARDIOVASCULAR: S1, S2 normal. No murmurs, rubs, or gallops.  ABDOMEN: Soft, non-tender, non-distended. Bowel sounds present. No organomegaly or mass.  EXTREMITIES: No pedal edema, cyanosis, or clubbing.  NEUROLOGIC: Cranial nerves II through XII are intact. Muscle strength 5/5 in all extremities. Sensation intact. Gait not checked.  PSYCHIATRIC: The patient is alert and oriented x 3.  SKIN: No obvious rash, lesion, or ulcer.   DATA REVIEW:   CBC   Recent Labs Lab 06/01/16 0943  WBC 14.9*  HGB 11.2*  HCT 33.5*  PLT 119*    Chemistries   Recent Labs Lab 05/31/16 2152 06/01/16 0943  NA 132* 134*  K 2.8* 3.1*  CL 99* 101  CO2 21* 21*  GLUCOSE 137* 150*  BUN 13 14  CREATININE 0.79 0.92  CALCIUM 9.0 9.1  MG  --  2.0  AST 64*  --   ALT 21  --   ALKPHOS 116  --   BILITOT 1.3*  --     Microbiology Results   Recent Results (from the past 240 hour(s))  Blood Culture (routine x 2)     Status: Abnormal   Collection Time: 05/31/16  9:52 PM  Result Value Ref Range Status   Specimen Description BLOOD R FA  Final   Special Requests Blood Culture adequate volume  Final   Culture  Setup Time   Final    GRAM NEGATIVE RODS IN  BOTH AEROBIC AND ANAEROBIC BOTTLES CRITICAL RESULT CALLED TO, READ BACK BY AND VERIFIED WITH: CHRISTY KATSOUDAS 06/01/16 8469 KLW Performed at [facility], [place] [address], [place], [place] [address]    Culture ESCHERICHIA COLI (A)  Final   Report Status 06/03/2016 FINAL  Final   Organism ID, Bacteria ESCHERICHIA COLI  Final      Susceptibility   Escherichia coli - MIC*    AMPICILLIN <=2 SENSITIVE Sensitive     CEFAZOLIN <=4 SENSITIVE Sensitive     CEFEPIME <=1 SENSITIVE Sensitive     CEFTAZIDIME <=1 SENSITIVE Sensitive     CEFTRIAXONE <=1 SENSITIVE Sensitive     CIPROFLOXACIN <=0.25 SENSITIVE Sensitive     GENTAMICIN <=1 SENSITIVE Sensitive     IMIPENEM <=0.25 SENSITIVE Sensitive     TRIMETH/SULFA <=20 SENSITIVE Sensitive     AMPICILLIN/SULBACTAM <=2 SENSITIVE Sensitive     PIP/TAZO <=4 SENSITIVE Sensitive     Extended ESBL NEGATIVE Sensitive     * ESCHERICHIA COLI  Blood Culture (routine x 2)     Status: Abnormal   Collection Time: 05/31/16  9:52 PM  Result Value Ref Range Status   Specimen Description BLOOD L WRIST  Final   Special Requests Blood Culture adequate volume  Final   Culture  Setup Time   Final    GRAM NEGATIVE  RODS IN BOTH AEROBIC AND ANAEROBIC BOTTLES CRITICAL RESULT CALLED TO, READ BACK BY AND VERIFIED WITH: CHRISTY KATSOUDAS 06/01/16 0907 KLW    Culture (A)  Final    ESCHERICHIA COLI SUSCEPTIBILITIES PERFORMED ON PREVIOUS CULTURE WITHIN THE LAST 5 DAYS. Performed at [facility], [place] [address], [place], [place] [address]    Report Status 06/03/2016 FINAL  Final  Urine culture     Status: Abnormal   Collection Time: 05/31/16  9:52 PM  Result Value Ref Range Status   Specimen Description URINE, RANDOM  Final   Special Requests NONE  Final   Culture >=100,000 COLONIES/mL ESCHERICHIA COLI (A)  Final   Report Status 06/03/2016 FINAL  Final   Organism ID, Bacteria ESCHERICHIA COLI (A)  Final      Susceptibility   Escherichia coli - MIC*     AMPICILLIN <=2 SENSITIVE Sensitive     CEFAZOLIN <=4 SENSITIVE Sensitive     CEFTRIAXONE <=1 SENSITIVE Sensitive     CIPROFLOXACIN <=0.25 SENSITIVE Sensitive     GENTAMICIN <=1 SENSITIVE Sensitive     IMIPENEM <=0.25 SENSITIVE Sensitive     NITROFURANTOIN <=16 SENSITIVE Sensitive     TRIMETH/SULFA <=20 SENSITIVE Sensitive     AMPICILLIN/SULBACTAM <=2 SENSITIVE Sensitive     PIP/TAZO <=4 SENSITIVE Sensitive     Extended ESBL NEGATIVE Sensitive     * >=100,000 COLONIES/mL ESCHERICHIA COLI  Blood Culture ID Panel (Reflexed)     Status: Abnormal   Collection Time: 05/31/16  9:52 PM  Result Value Ref Range Status   Enterococcus species NOT DETECTED NOT DETECTED Final   Listeria monocytogenes NOT DETECTED NOT DETECTED Final   Staphylococcus species NOT DETECTED NOT DETECTED Final   Staphylococcus aureus NOT DETECTED NOT DETECTED Final   Streptococcus species NOT DETECTED NOT DETECTED Final   Streptococcus agalactiae NOT DETECTED NOT DETECTED Final   Streptococcus pneumoniae NOT DETECTED NOT DETECTED Final   Streptococcus pyogenes NOT DETECTED NOT DETECTED Final   Acinetobacter baumannii NOT DETECTED NOT DETECTED Final   Enterobacteriaceae species DETECTED (A) NOT DETECTED Final    Comment: Enterobacteriaceae represent a large family of gram-negative bacteria, not a single organism. CRITICAL RESULT CALLED TO, READ BACK BY AND VERIFIED WITH: CHRISTY KATSOUDAS 06/01/16 0907 KLW    Enterobacter cloacae complex NOT DETECTED NOT DETECTED Final   Escherichia coli DETECTED (A) NOT DETECTED Final    Comment: CRITICAL RESULT CALLED TO, READ BACK BY AND VERIFIED WITH: CHRISTY KATSOUDAS 06/01/16 0907 KLW    Klebsiella oxytoca NOT DETECTED NOT DETECTED Final   Klebsiella pneumoniae NOT DETECTED NOT DETECTED Final   Proteus species NOT DETECTED NOT DETECTED Final   Serratia marcescens NOT DETECTED NOT DETECTED Final   Carbapenem resistance NOT DETECTED NOT DETECTED Final   Haemophilus influenzae NOT  DETECTED NOT DETECTED Final   Neisseria meningitidis NOT DETECTED NOT DETECTED Final   Pseudomonas aeruginosa NOT DETECTED NOT DETECTED Final   Candida albicans NOT DETECTED NOT DETECTED Final   Candida glabrata NOT DETECTED NOT DETECTED Final   Candida krusei NOT DETECTED NOT DETECTED Final   Candida parapsilosis NOT DETECTED NOT DETECTED Final   Candida tropicalis NOT DETECTED NOT DETECTED Final  MRSA PCR Screening     Status: None   Collection Time: 06/01/16  6:00 AM  Result Value Ref Range Status   MRSA by PCR NEGATIVE NEGATIVE Final    Comment:        The GeneXpert MRSA Assay (FDA approved for NASAL specimens only), is  one component of a comprehensive MRSA colonization surveillance program. It is not intended to diagnose MRSA infection nor to guide or monitor treatment for MRSA infections.     RADIOLOGY:  Dg Chest Port 1 View  Result Date: 06/01/2016 CLINICAL DATA:  83 y/o  F; shortness of breath. EXAM: PORTABLE CHEST 1 VIEW COMPARISON:  05/31/2016 chest radiograph. FINDINGS: Stable cardiac silhouette. Aortic atherosclerosis with calcification. Low lung volumes with minor bibasilar atelectasis. No focal consolidation. No pleural effusion or pneumothorax. Bones are unremarkable. IMPRESSION: Low lung volumes.  No acute pulmonary process identified. Electronically Signed   By: Kristine Garbe M.D.   On: 06/01/2016 17:01     Management plans discussed with the patient, family and they are in agreement.  CODE STATUS:     Code Status Orders        Start     Ordered   06/01/16 0310  Do not attempt resuscitation (DNR)  Continuous    Question Answer Comment  In the event of cardiac or respiratory ARREST Do not call a "code blue"   In the event of cardiac or respiratory ARREST Do not perform Intubation, CPR, defibrillation or ACLS   In the event of cardiac or respiratory ARREST Use medication by any route, position, wound care, and other measures to relive pain and  suffering. May use oxygen, suction and manual treatment of airway obstruction as needed for comfort.      06/01/16 0309    Code Status History    Date Active Date Inactive Code Status Order ID Comments User Context   06/01/2016  2:11 AM 06/01/2016  3:09 AM Full Code [id number]  Harvie Bridge, DO Inpatient   05/01/2016  6:24 AM 05/06/2016  8:40 PM DNR [id number]  Saundra Shelling, MD Inpatient   05/01/2016  5:19 AM 05/01/2016  6:24 AM Full Code [id number]  Yolonda Kida, MD Inpatient   10/22/2015  8:40 PM 10/24/2015 11:40 PM Full Code [id number]  Vaughan Basta, MD Inpatient   05/21/2015 10:34 AM 05/25/2015  5:36 PM DNR [id number]  Flora Lipps, MD Inpatient   05/19/2015  4:56 PM 05/21/2015 10:34 AM Full Code [id number]  Flora Lipps, MD ED   01/27/2015  5:23 PM 01/28/2015  4:52 PM Full Code [id number]  [name], MD ED    Advance Directive Documentation     Most Recent Value  Type of Advance Directive  Out of facility DNR (pink MOST or yellow form)  Pre-existing out of facility DNR order (yellow form or pink MOST form)  -  "MOST" Form in Place?  -      TOTAL TIME TAKING CARE OF THIS PATIENT: *40 minutes.    Kimbella Heisler M.D on 06/03/2016 at 10:29 AM  Between 7am to 6pm - Pager - [phone] After 6pm go to [email or web address] - password [facility]  Office  [phone]  CC: Primary care physician; BABAOFF, Caryl Bis, MD

## 2016-06-03 NOTE — Progress Notes (Signed)
Pt used BSC x 2. Both time, pt O2 sats dropped into the 80's and she became very SOB even with 2L nasal cannula. HR reported per CCMD in 120s. BP elevated as well. Temperature normal, pt reports felling cold and shivering. Pt is alert and oriented. Pt refuses bedpan.

## 2016-06-06 DIAGNOSIS — I481 Persistent atrial fibrillation: Secondary | ICD-10-CM | POA: Diagnosis not present

## 2016-06-06 DIAGNOSIS — J439 Emphysema, unspecified: Secondary | ICD-10-CM | POA: Diagnosis not present

## 2016-06-06 DIAGNOSIS — B9620 Unspecified Escherichia coli [E. coli] as the cause of diseases classified elsewhere: Secondary | ICD-10-CM | POA: Diagnosis not present

## 2016-06-06 DIAGNOSIS — F325 Major depressive disorder, single episode, in full remission: Secondary | ICD-10-CM | POA: Diagnosis not present

## 2016-06-06 DIAGNOSIS — N390 Urinary tract infection, site not specified: Secondary | ICD-10-CM | POA: Diagnosis not present

## 2016-06-06 DIAGNOSIS — I5032 Chronic diastolic (congestive) heart failure: Secondary | ICD-10-CM | POA: Diagnosis not present

## 2016-06-06 DIAGNOSIS — G20 Parkinson's disease: Secondary | ICD-10-CM | POA: Diagnosis not present

## 2016-06-12 ENCOUNTER — Encounter: Payer: Self-pay | Admitting: Emergency Medicine

## 2016-06-12 ENCOUNTER — Emergency Department: Payer: Medicare HMO

## 2016-06-12 ENCOUNTER — Inpatient Hospital Stay
Admission: EM | Admit: 2016-06-12 | Discharge: 2016-06-20 | DRG: 871 | Disposition: A | Discharge status: Hospice | Payer: Medicare HMO | Attending: Internal Medicine | Admitting: Internal Medicine

## 2016-06-12 DIAGNOSIS — Z823 Family history of stroke: Secondary | ICD-10-CM

## 2016-06-12 DIAGNOSIS — Z86711 Personal history of pulmonary embolism: Secondary | ICD-10-CM

## 2016-06-12 DIAGNOSIS — D638 Anemia in other chronic diseases classified elsewhere: Secondary | ICD-10-CM | POA: Diagnosis present

## 2016-06-12 DIAGNOSIS — G20 Parkinson's disease: Secondary | ICD-10-CM | POA: Diagnosis present

## 2016-06-12 DIAGNOSIS — G9341 Metabolic encephalopathy: Secondary | ICD-10-CM | POA: Diagnosis not present

## 2016-06-12 DIAGNOSIS — I959 Hypotension, unspecified: Secondary | ICD-10-CM | POA: Diagnosis not present

## 2016-06-12 DIAGNOSIS — B9620 Unspecified Escherichia coli [E. coli] as the cause of diseases classified elsewhere: Secondary | ICD-10-CM | POA: Diagnosis not present

## 2016-06-12 DIAGNOSIS — K829 Disease of gallbladder, unspecified: Secondary | ICD-10-CM | POA: Diagnosis not present

## 2016-06-12 DIAGNOSIS — A419 Sepsis, unspecified organism: Secondary | ICD-10-CM | POA: Diagnosis not present

## 2016-06-12 DIAGNOSIS — Z515 Encounter for palliative care: Secondary | ICD-10-CM | POA: Diagnosis not present

## 2016-06-12 DIAGNOSIS — R52 Pain, unspecified: Secondary | ICD-10-CM

## 2016-06-12 DIAGNOSIS — N390 Urinary tract infection, site not specified: Secondary | ICD-10-CM | POA: Diagnosis present

## 2016-06-12 DIAGNOSIS — R410 Disorientation, unspecified: Secondary | ICD-10-CM | POA: Diagnosis not present

## 2016-06-12 DIAGNOSIS — E039 Hypothyroidism, unspecified: Secondary | ICD-10-CM | POA: Diagnosis not present

## 2016-06-12 DIAGNOSIS — Z7951 Long term (current) use of inhaled steroids: Secondary | ICD-10-CM

## 2016-06-12 DIAGNOSIS — Z7982 Long term (current) use of aspirin: Secondary | ICD-10-CM | POA: Diagnosis not present

## 2016-06-12 DIAGNOSIS — Z87891 Personal history of nicotine dependence: Secondary | ICD-10-CM

## 2016-06-12 DIAGNOSIS — R112 Nausea with vomiting, unspecified: Secondary | ICD-10-CM | POA: Diagnosis not present

## 2016-06-12 DIAGNOSIS — R7881 Bacteremia: Secondary | ICD-10-CM | POA: Diagnosis not present

## 2016-06-12 DIAGNOSIS — I480 Paroxysmal atrial fibrillation: Secondary | ICD-10-CM | POA: Diagnosis present

## 2016-06-12 DIAGNOSIS — J449 Chronic obstructive pulmonary disease, unspecified: Secondary | ICD-10-CM | POA: Diagnosis present

## 2016-06-12 DIAGNOSIS — Z90710 Acquired absence of both cervix and uterus: Secondary | ICD-10-CM | POA: Diagnosis not present

## 2016-06-12 DIAGNOSIS — E785 Hyperlipidemia, unspecified: Secondary | ICD-10-CM | POA: Diagnosis not present

## 2016-06-12 DIAGNOSIS — D72829 Elevated white blood cell count, unspecified: Secondary | ICD-10-CM | POA: Diagnosis not present

## 2016-06-12 DIAGNOSIS — Z8249 Family history of ischemic heart disease and other diseases of the circulatory system: Secondary | ICD-10-CM

## 2016-06-12 DIAGNOSIS — Z96652 Presence of left artificial knee joint: Secondary | ICD-10-CM | POA: Diagnosis present

## 2016-06-12 DIAGNOSIS — M109 Gout, unspecified: Secondary | ICD-10-CM | POA: Diagnosis present

## 2016-06-12 DIAGNOSIS — R4182 Altered mental status, unspecified: Secondary | ICD-10-CM | POA: Diagnosis not present

## 2016-06-12 DIAGNOSIS — Z955 Presence of coronary angioplasty implant and graft: Secondary | ICD-10-CM

## 2016-06-12 DIAGNOSIS — F419 Anxiety disorder, unspecified: Secondary | ICD-10-CM | POA: Diagnosis present

## 2016-06-12 DIAGNOSIS — D649 Anemia, unspecified: Secondary | ICD-10-CM | POA: Diagnosis not present

## 2016-06-12 DIAGNOSIS — Z833 Family history of diabetes mellitus: Secondary | ICD-10-CM | POA: Diagnosis not present

## 2016-06-12 DIAGNOSIS — G4733 Obstructive sleep apnea (adult) (pediatric): Secondary | ICD-10-CM | POA: Diagnosis present

## 2016-06-12 DIAGNOSIS — I422 Other hypertrophic cardiomyopathy: Secondary | ICD-10-CM | POA: Diagnosis present

## 2016-06-12 DIAGNOSIS — R627 Adult failure to thrive: Secondary | ICD-10-CM | POA: Diagnosis not present

## 2016-06-12 DIAGNOSIS — Z79899 Other long term (current) drug therapy: Secondary | ICD-10-CM

## 2016-06-12 DIAGNOSIS — Z7189 Other specified counseling: Secondary | ICD-10-CM

## 2016-06-12 DIAGNOSIS — Z6830 Body mass index (BMI) 30.0-30.9, adult: Secondary | ICD-10-CM

## 2016-06-12 DIAGNOSIS — Z66 Do not resuscitate: Secondary | ICD-10-CM | POA: Diagnosis not present

## 2016-06-12 DIAGNOSIS — I2510 Atherosclerotic heart disease of native coronary artery without angina pectoris: Secondary | ICD-10-CM | POA: Diagnosis present

## 2016-06-12 DIAGNOSIS — C84A0 Cutaneous T-cell lymphoma, unspecified, unspecified site: Secondary | ICD-10-CM | POA: Diagnosis present

## 2016-06-12 DIAGNOSIS — I252 Old myocardial infarction: Secondary | ICD-10-CM

## 2016-06-12 DIAGNOSIS — E43 Unspecified severe protein-calorie malnutrition: Secondary | ICD-10-CM | POA: Diagnosis not present

## 2016-06-12 DIAGNOSIS — R0902 Hypoxemia: Secondary | ICD-10-CM | POA: Diagnosis not present

## 2016-06-12 DIAGNOSIS — M797 Fibromyalgia: Secondary | ICD-10-CM | POA: Diagnosis present

## 2016-06-12 DIAGNOSIS — Z7401 Bed confinement status: Secondary | ICD-10-CM | POA: Diagnosis not present

## 2016-06-12 DIAGNOSIS — R7989 Other specified abnormal findings of blood chemistry: Secondary | ICD-10-CM | POA: Diagnosis not present

## 2016-06-12 DIAGNOSIS — K219 Gastro-esophageal reflux disease without esophagitis: Secondary | ICD-10-CM | POA: Diagnosis present

## 2016-06-12 DIAGNOSIS — R748 Abnormal levels of other serum enzymes: Secondary | ICD-10-CM | POA: Diagnosis not present

## 2016-06-12 DIAGNOSIS — I10 Essential (primary) hypertension: Secondary | ICD-10-CM | POA: Diagnosis present

## 2016-06-12 DIAGNOSIS — A4151 Sepsis due to Escherichia coli [E. coli]: Principal | ICD-10-CM | POA: Diagnosis present

## 2016-06-12 DIAGNOSIS — Z87440 Personal history of urinary (tract) infections: Secondary | ICD-10-CM

## 2016-06-12 DIAGNOSIS — T462X5A Adverse effect of other antidysrhythmic drugs, initial encounter: Secondary | ICD-10-CM | POA: Diagnosis not present

## 2016-06-12 DIAGNOSIS — R001 Bradycardia, unspecified: Secondary | ICD-10-CM | POA: Diagnosis not present

## 2016-06-12 LAB — COMPREHENSIVE METABOLIC PANEL
ALBUMIN: 3.1 g/dL — AB (ref 3.5–5.0)
ALT: 7 U/L — AB (ref 14–54)
AST: 27 U/L (ref 15–41)
Alkaline Phosphatase: 99 U/L (ref 38–126)
Anion gap: 11 (ref 5–15)
BUN: 11 mg/dL (ref 6–20)
CHLORIDE: 99 mmol/L — AB (ref 101–111)
CO2: 24 mmol/L (ref 22–32)
CREATININE: 1.1 mg/dL — AB (ref 0.44–1.00)
Calcium: 8.8 mg/dL — ABNORMAL LOW (ref 8.9–10.3)
GFR calc Af Amer: 53 mL/min — ABNORMAL LOW (ref >60)
GFR, EST NON AFRICAN AMERICAN: 46 mL/min — AB (ref >60)
Glucose, Bld: 108 mg/dL — ABNORMAL HIGH (ref 65–99)
POTASSIUM: 3.8 mmol/L (ref 3.5–5.1)
Sodium: 134 mmol/L — ABNORMAL LOW (ref 135–145)
Total Bilirubin: 0.9 mg/dL (ref 0.3–1.2)
Total Protein: 6.1 g/dL — ABNORMAL LOW (ref 6.5–8.1)

## 2016-06-12 LAB — URINALYSIS, ROUTINE W REFLEX MICROSCOPIC
BILIRUBIN URINE: NEGATIVE
Glucose, UA: NEGATIVE mg/dL
HGB URINE DIPSTICK: NEGATIVE
Ketones, ur: NEGATIVE mg/dL
Nitrite: NEGATIVE
PROTEIN: NEGATIVE mg/dL
Specific Gravity, Urine: 1.008 (ref 1.005–1.030)
pH: 6 (ref 5.0–8.0)

## 2016-06-12 LAB — CBC WITH DIFFERENTIAL/PLATELET
Basophils Absolute: 0 10*3/uL (ref 0–0.1)
Basophils Relative: 0 %
EOS ABS: 0 10*3/uL (ref 0–0.7)
EOS PCT: 0 %
HCT: 28 % — ABNORMAL LOW (ref 35.0–47.0)
Hemoglobin: 9.6 g/dL — ABNORMAL LOW (ref 12.0–16.0)
LYMPHS ABS: 0.8 10*3/uL — AB (ref 1.0–3.6)
LYMPHS PCT: 3 %
MCH: 31 pg (ref 26.0–34.0)
MCHC: 34.3 g/dL (ref 32.0–36.0)
MCV: 90.5 fL (ref 80.0–100.0)
MONO ABS: 1.7 10*3/uL — AB (ref 0.2–0.9)
MONOS PCT: 7 %
Neutro Abs: 23 10*3/uL — ABNORMAL HIGH (ref 1.4–6.5)
Neutrophils Relative %: 90 %
PLATELETS: 405 10*3/uL (ref 150–440)
RBC: 3.09 MIL/uL — AB (ref 3.80–5.20)
RDW: 14.2 % (ref 11.5–14.5)
WBC: 25.5 10*3/uL — ABNORMAL HIGH (ref 3.6–11.0)

## 2016-06-12 LAB — LACTIC ACID, PLASMA: Lactic Acid, Venous: 1.2 mmol/L (ref 0.5–1.9)

## 2016-06-12 LAB — TROPONIN I: TROPONIN I: 0.18 ng/mL — AB (ref <0.03)

## 2016-06-12 MED ORDER — VANCOMYCIN HCL IN DEXTROSE 1-5 GM/200ML-% IV SOLN
1000.0000 mg | INTRAVENOUS | Status: DC
  Administered 2016-06-13: 04:00:00 1000 mg via INTRAVENOUS
  Filled 2016-06-12: qty 200

## 2016-06-12 MED ORDER — SODIUM CHLORIDE 0.9 % IV BOLUS (SEPSIS)
250.0000 mL | Freq: Once | INTRAVENOUS | Status: AC
  Administered 2016-06-12: 250 mL via INTRAVENOUS

## 2016-06-12 MED ORDER — SODIUM CHLORIDE 0.9 % IV BOLUS (SEPSIS)
1000.0000 mL | Freq: Once | INTRAVENOUS | Status: AC
  Administered 2016-06-12: 1000 mL via INTRAVENOUS

## 2016-06-12 MED ORDER — PIPERACILLIN-TAZOBACTAM 3.375 G IVPB
3.3750 g | Freq: Three times a day (TID) | INTRAVENOUS | Status: DC
  Administered 2016-06-13: 04:00:00 3.375 g via INTRAVENOUS
  Filled 2016-06-12 (×3): qty 50

## 2016-06-12 MED ORDER — VANCOMYCIN HCL IN DEXTROSE 1-5 GM/200ML-% IV SOLN
1000.0000 mg | Freq: Once | INTRAVENOUS | Status: AC
  Administered 2016-06-12: 1000 mg via INTRAVENOUS
  Filled 2016-06-12: qty 200

## 2016-06-12 MED ORDER — PIPERACILLIN-TAZOBACTAM 3.375 G IVPB 30 MIN
3.3750 g | Freq: Once | INTRAVENOUS | Status: AC
  Administered 2016-06-12: 3.375 g via INTRAVENOUS
  Filled 2016-06-12: qty 50

## 2016-06-12 NOTE — ED Notes (Signed)
In and out cath completed by this RN. Sterile technique maintained. Patient tolerated well.

## 2016-06-12 NOTE — ED Notes (Signed)
[place] paged

## 2016-06-12 NOTE — ED Notes (Addendum)
DNR in place. Will hold pressors at this time per MD.

## 2016-06-12 NOTE — ED Notes (Signed)
MD notified of BP

## 2016-06-12 NOTE — Progress Notes (Signed)
Pharmacy Antibiotic Note  Hailey [name] is a 83 y.o. female admitted on 06/12/2016 with sepsis.  Pharmacy has been consulted for vanc/zosyn dosing.  Plan: Patient received vanc 1g IV x 1 in ED  Will follow-up w/ vanc 1g q24h w/ 8 hour stack dose. Will check a VT 5/3 @ 0200 prior to 4th dose. Will start Zosyn 3.375g IV q8h extended infusion. Ke 0.0346 t1/2 20 hours ~ 24 hours  Height: 5\' 1"  (154.9 cm) Weight: 160 lb (72.6 kg) IBW/kg (Calculated) : 47.8  Temp (24hrs), Avg:99 F (37.2 C), Min:99 F (37.2 C), Max:99 F (37.2 C)   Recent Labs Lab 06/12/16 1845  WBC 25.5*  CREATININE 1.10*  LATICACIDVEN 1.2    Estimated Creatinine Clearance: 36.5 mL/min (A) (by C-G formula based on SCr of 1.1 mg/dL (H)).    Allergies  Allergen Reactions  . Aspirin Other (See Comments)    Reaction:  GI bleeding   . Codeine Nausea And Vomiting  . Compazine [Prochlorperazine] Other (See Comments)    Confusion  . Ketorolac Tromethamine Other (See Comments)    Reaction:  Headache   . Nsaids Other (See Comments)    Reaction:  GI bleeding   . Phenazopyridine Hcl Other (See Comments)    Reaction:  Vision problems   . Macrodantin [Nitrofurantoin Macrocrystal] Rash     Thank you for allowing pharmacy to be a part of this patient's care.  Tobie Lords, PharmD, BCPS Clinical Pharmacist 06/12/2016

## 2016-06-12 NOTE — ED Notes (Signed)
Family at bedside, pt resting quietly, cont to monitor

## 2016-06-12 NOTE — ED Notes (Signed)
POA at bedside. MD notified.

## 2016-06-12 NOTE — ED Triage Notes (Signed)
Patient presents to ED via [facility] from [facility]. Patient was sent for AMS and fever. Fever at nursing facility was 103 there she was medicated with tylenol. Temp here 99 orally. Patient 89% SpO2 on arrival, requiring 4L of O2. Patient denies any complaints at this time.

## 2016-06-12 NOTE — ED Notes (Signed)
MD notified of BP post fluid administration.

## 2016-06-12 NOTE — ED Notes (Signed)
MD at bedside speaking to family.

## 2016-06-12 NOTE — ED Provider Notes (Signed)
[facility] Emergency Department Provider Note  Time seen: 6:35 PM  I have reviewed the triage vital signs and the nursing notes.   HISTORY  Chief Complaint No chief complaint on file.    HPI Hailey [name] is a 83 y.o. female with multiple medical issues including CHF, COPD, anxiety, history of the DVT status post IVC filter, hypertension, hyperlipidemia, cardiomyopathy, paroxysmal atrial fibrillation on aspirin therapy, history of a PE, presents to the emergency department for altered mental status and fever. According to EMS reported the patient is coming from [place] due to increased somnolence, altered mental status as well as a fever to 103 today. Patient was recently discharged from the hospital 06/03/16 after admission for Escherichia coli sepsis secondary to urinary tract infection. Patient was discharged on Keflex 7 days. On arrival patient is extremely somnolent, no will occasionally answer simple questions but to most questions does not respond. She will occasionally open eyes to verbal stimuli, she will open eyes to painful stimuli. Patient received Tylenol prior to arrival nursing home reports 103 fever, EMS reports 101 fever, 99 in the emergency department. Patient is hypoxic with a pulse ox in the upper 80s on room air with no baseline O2 requirement.  Past Medical History:  Diagnosis Date  . Anemia    Chronic  . Anxiety   . Arthritis   . Asthma   . Atrophic vaginitis   . Cerebral aneurysm    Hx of  . CHF (congestive heart failure) ([place])   . COPD (chronic obstructive pulmonary disease) ([place])   . Diverticulitis   . DVT (deep vein thrombosis) in pregnancy ([place]) 09/2008   a. LLE, recurrent hx, has IVC filter. Not on coumadin now with history of GI bleeding  . Fibrocystic disease of breast   . Fibromyalgia   . GERD (gastroesophageal reflux disease)   . Gout   . Gross hematuria   . Heart murmur   . History of colonoscopy   . Hyperlipidemia    . Hypertension   . Hypertrophic cardiomyopathy ([place])    a. Echo 4/09 with EF 70-75%, asymmetricy basal septal hypertrophy, mild MR without systolic anterior motion of the mitral valve, LVOT gradient to 130 mmHg with Valsalva b. Echo 7/13: EF 60-65%, mild focal basal septal hypertrophy, no significant LVOT gradient, no MV SAM c. Echo 2/15: EF 55-60%, HOCM, resting LVOT gradient 29 mmHg, Valsalva LVOT gradient > 140 mmHg, mild LVH, mild TR, elevated PASP  . Incomplete bladder emptying   . Migraine headache    Hx of  . Obesity   . OSA (obstructive sleep apnea)    a. Intolerant to CPAP, wears 2L via n/c  . Osteoarthritis    Hx of left TKR  . PAF (paroxysmal atrial fibrillation) ([place])    a. Full-dose ASA alone 2/2 h/o GIB.  Marland Kitchen PAT (paroxysmal atrial tachycardia) ([place])   . 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 Pneumonia   . PUD (peptic ulcer disease)    With GI bleeding  . Pulmonary embolism ([place])   . Tremor     Patient Active Problem List   Diagnosis Date Noted  . Sepsis secondary to UTI ([place]) 06/01/2016  . Acute ST elevation myocardial infarction (STEMI) involving left anterior descending (LAD) coronary artery ([place]) 05/01/2016  . STEMI (ST elevation myocardial infarction) ([place]) 05/01/2016  . Protein-calorie malnutrition, severe 10/23/2015  . Uncontrollable vomiting   . Nausea and vomiting 10/22/2015  . Tremor 07/07/2015  .  Respiratory failure ([place]) 05/19/2015  . Weakness 01/27/2015  . Abdomen enlarged 09/26/2013  . CTCL (cutaneous T-cell lymphoma) ([place]) 09/18/2013  . Anxiety 08/09/2013  . CCF (congestive cardiac failure) ([place]) 08/09/2013  . Chronic pain 08/09/2013  . Chronic diastolic CHF (congestive heart failure) ([place]) 07/12/2013  . Benign essential HTN 07/01/2013  . Bilateral leg edema 06/06/2013  . PAF (paroxysmal atrial fibrillation) ([place]) 04/25/2013  . DOE (dyspnea on exertion) 10/11/2011  . COPD (chronic obstructive pulmonary  disease) ([place]) 10/09/2011  . Cognitive decline 10/09/2011  . Behavior concern 08/23/2011  . Chest pain 10/26/2010  . Palpitations 10/26/2010  . PHLEBITIS AND THROMBOPHLEBITIS OF FEMORAL VEIN 10/13/2008  . DEEP VENOUS THROMBOPHLEBITIS, LEG, LEFT 10/09/2008  . GASTRIC ULCER, ACUTE 10/09/2008  . PERSONAL HX COLONIC POLYPS 09/02/2008  . FATIGUE 08/27/2008  . HOCM (hypertrophic obstructive cardiomyopathy) ([place]) 11/06/2007  . HYPOTENSION, ORTHOSTATIC 11/06/2007  . ADENOMATOUS COLONIC POLYP 04/12/2007  . HIATAL HERNIA 04/12/2007  . Diverticulosis of colon (without mention of hemorrhage) 04/12/2007  . Hyperlipidemia 02/03/2007  . ANEMIA, CHRONIC 02/03/2007  . MIGRAINE HEADACHE 02/03/2007  . Essential hypertension 02/03/2007  . CEREBRAL ANEURYSM 02/03/2007  . GASTROESOPHAGEAL REFLUX DISEASE 02/03/2007  . OSTEOARTHRITIS 02/03/2007  . FIBROMYALGIA 02/03/2007  . CHRONIC FATIGUE SYNDROME 02/03/2007  . MITRAL VALVE PROLAPSE, HX OF 02/03/2007  . PULMONARY EMBOLISM, HX OF 02/03/2007  . TOTAL KNEE REPLACEMENT, LEFT, HX OF 02/03/2007  . HYSTERECTOMY, HX OF 02/03/2007  . Other acquired absence of organ 02/03/2007  . INGUINAL HERNIORRHAPHY, RIGHT, HX OF 02/03/2007    Past Surgical History:  Procedure Laterality Date  . ABDOMINAL HYSTERECTOMY    . APPENDECTOMY    . BLADDER SUSPENSION    . CARDIAC CATHETERIZATION  2009   No significant CAD  . CARDIOVASCULAR STRESS TEST     a. Lexiscan Myoview 3/14: EF 76%, no evidence of ischemia or WMAs  . cataract surgery    . CEREBRAL ANEURYSM REPAIR  1998  . CORONARY STENT INTERVENTION N/A 05/01/2016   Procedure: Coronary Stent Intervention;  Surgeon: Yolonda Kida, MD;  Location: [facility];  Service: Cardiovascular;  Laterality: N/A;  . ESOPHAGOGASTRODUODENOSCOPY (EGD) WITH PROPOFOL N/A 11/24/2015   Procedure: ESOPHAGOGASTRODUODENOSCOPY (EGD) WITH PROPOFOL;  Surgeon: Lollie Sails, MD;  Location: [facility] END[facility];  Service: Endoscopy;   Laterality: N/A;  . FOOT SURGERY     Bilateral  . INGUINAL HERNIA REPAIR  1968   Right  . KNEE ARTHROSCOPY  2009   Right  . LEFT HEART CATH AND CORONARY ANGIOGRAPHY N/A 05/01/2016   Procedure: Left Heart Cath and Coronary Angiography;  Surgeon: Yolonda Kida, MD;  Location: [facility];  Service: Cardiovascular;  Laterality: N/A;  . SHOULDER SURGERY  1972   Right   Left 2012  . TONSILLECTOMY AND ADENOIDECTOMY    . TOTAL KNEE ARTHROPLASTY     Left    Prior to Admission medications   Medication Sig Start Date End Date Taking? Authorizing Provider  acetaminophen (TYLENOL) 325 MG tablet Take 2 tablets (650 mg total) by mouth every 6 (six) hours as needed for mild pain or moderate pain (temp > 101.5). 05/25/15   Nicholes Mango, MD  albuterol (PROVENTIL HFA;VENTOLIN HFA) 108 (90 Base) MCG/ACT inhaler Inhale 1 puff into the lungs every 6 (six) hours as needed for wheezing or shortness of breath.    Historical Provider, MD  albuterol (PROVENTIL) (2.5 MG/3ML) 0.083% nebulizer solution Take 3 mLs (2.5 mg total) by nebulization every 4 (four) hours as  needed for wheezing or shortness of breath. 05/25/15   Nicholes Mango, MD  amiodarone (PACERONE) 200 MG tablet Take 1 tablet (200 mg total) by mouth daily. Pt is able to take an additional tablet if needed for breakthrough arrhythmias. 08/04/15   Minna Merritts, MD  aspirin EC 81 MG EC tablet Take 1 tablet (81 mg total) by mouth daily. 05/06/16   Max Sane, MD  atorvastatin (LIPITOR) 80 MG tablet Take 1 tablet (80 mg total) by mouth daily at 6 PM. 05/06/16   Max Sane, MD  carbidopa-levodopa (SINEMET IR) 25-250 MG tablet Take 1 tablet by mouth 4 (four) times daily. 05/06/16   Max Sane, MD  chlorhexidine (PERIDEX) 0.12 % solution 15 mLs by Mouth Rinse route 2 (two) times daily. 04/14/16   Historical Provider, MD  diazepam (VALIUM) 2 MG tablet Take 1 tablet (2 mg total) by mouth every 6 (six) hours as needed for anxiety (or back pain). 05/06/16   Max Sane, MD  dimenhyDRINATE (DRAMAMINE) 50 MG tablet Take 25-50 mg by mouth every 6 (six) hours as needed for dizziness.     Historical Provider, MD  docusate sodium (COLACE) 100 MG capsule Take 100 mg by mouth 2 (two) times daily as needed for mild constipation.    Historical Provider, MD  DULoxetine (CYMBALTA) 30 MG capsule Take 3 capsules (90 mg total) by mouth daily. 05/07/16   Max Sane, MD  feeding supplement, ENSURE ENLIVE, (ENSURE ENLIVE) LIQD Take 237 mLs by mouth 2 (two) times daily between meals. 01/28/15   Bettey Costa, MD  fluticasone (FLONASE) 50 MCG/ACT nasal spray Place 2 sprays into both nostrils daily as needed for rhinitis.    Historical Provider, MD  gabapentin (NEURONTIN) 100 MG capsule Take 200-400 mg by mouth 3 (three) times daily. Pt takes three capsules in the morning, two capsules in the evening, and four capsules at bedtime.    Historical Provider, MD  isosorbide mononitrate (IMDUR) 30 MG 24 hr tablet Take 1 tablet (30 mg total) by mouth daily. 05/06/16   Max Sane, MD  Levothyroxine Sodium 75 MCG CAPS Take 50 mcg by mouth daily before breakfast.     Historical Provider, MD  losartan (COZAAR) 25 MG tablet Take 25 mg by mouth daily. 11/27/15   Historical Provider, MD  mometasone-formoterol (DULERA) 100-5 MCG/ACT AERO Inhale 2 puffs into the lungs 2 (two) times daily. 05/25/15   Nicholes Mango, MD  montelukast (SINGULAIR) 10 MG tablet Take 10 mg by mouth at bedtime.    Historical Provider, MD  ondansetron (ZOFRAN-ODT) 4 MG disintegrating tablet Take 4 mg by mouth every 8 (eight) hours as needed for nausea or vomiting.    Historical Provider, MD  oxyCODONE (OXY IR/ROXICODONE) 5 MG immediate release tablet Take 1 tablet (5 mg total) by mouth every 6 (six) hours as needed for moderate pain or severe pain. 05/06/16   Max Sane, MD  pantoprazole (PROTONIX) 40 MG tablet Take 40 mg by mouth daily.    Historical Provider, MD  polyethylene glycol (MIRALAX / GLYCOLAX) packet Take 17 g by mouth  daily as needed for moderate constipation.     Historical Provider, MD  potassium chloride (K-DUR,KLOR-CON) 10 MEQ tablet Take 1 tablet (10 mEq total) by mouth 3 (three) times daily. 05/27/16   Minna Merritts, MD  senna (SENOKOT) 8.6 MG TABS tablet Take 2 tablets (17.2 mg total) by mouth 2 (two) times daily. 05/25/15   Nicholes Mango, MD  sucralfate (CARAFATE) 1 g tablet Take  1 g by mouth 2 (two) times daily. 03/21/16   Historical Provider, MD  ticagrelor (BRILINTA) 90 MG TABS tablet Take 1 tablet (90 mg total) by mouth 2 (two) times daily. 05/06/16   Max Sane, MD  tiotropium (SPIRIVA) 18 MCG inhalation capsule Place 18 mcg into inhaler and inhale daily.    Historical Provider, MD  torsemide (DEMADEX) 20 MG tablet Take 1 tablet (20 mg total) by mouth 2 (two) times daily. 10/24/15   Gladstone Lighter, MD  triamcinolone ointment (KENALOG) 0.1 % Apply 1 application topically 2 (two) times daily as needed (for rash).     Historical Provider, MD    Allergies  Allergen Reactions  . Aspirin Other (See Comments)    Reaction:  GI bleeding   . Codeine Nausea And Vomiting  . Compazine [Prochlorperazine] Other (See Comments)    Confusion  . Ketorolac Tromethamine Other (See Comments)    Reaction:  Headache   . Nsaids Other (See Comments)    Reaction:  GI bleeding   . Phenazopyridine Hcl Other (See Comments)    Reaction:  Vision problems   . Macrodantin [Nitrofurantoin Macrocrystal] Rash    Family History  Problem Relation Age of Onset  . Pancreatic cancer Mother   . Hypertension Mother     father  . Diabetes Mellitus II Mother     Sister  . Colon cancer Father   . Colon polyps Father   . Heart disease Father   . Stroke Father   . Heart disease Sister     More than 1 sister  . Colon cancer Brother   . Colon polyps Other     Siblings  . Ulcers Brother     Social History Social History  Substance Use Topics  . Smoking status: Former Smoker    Packs/day: 1.00    Years: 60.00    Types:  Cigarettes    Quit date: 12/15/2008  . Smokeless tobacco: Never Used     Comment: smoked for 50 years quit 5 + years  . Alcohol use 0.0 oz/week     Comment: Socially    Review of Systems Unable to obtain an accurate/adequate review of systems secondary to altered mental status.  ____________________________________________   PHYSICAL EXAM:  Constitutional: Patient is extremely somnolent with eyes Closed most the exam will occasionally open to verbal command and will open them to painful command. Patient is quite warm to the touch. Eyes: Normal exam ENT   Head: Normocephalic and atraumatic.   Nose: No congestion/rhinnorhea.   Mouth/Throat: Mucous membranes are moist. Cardiovascular: Regular rate and rhythm,  Respiratory: Normal respiratory effort without tachypnea nor retractions. Breath sounds are clear  Gastrointestinal: Soft and nontender. No distention.  No reaction to abdominal palpation Musculoskeletal: Nontender with normal range of motion in all extremities.  Neurologic:  Normal speech and language. No gross focal neurologic deficits Skin:  Skin is warm, dry and intact.  Psychiatric: Somnolent  ____________________________________________    EKG  EKG reviewed and interpreted by myself shows normal sinus rhythm at 75 bpm, narrow QRS, normal axis, normal intervals, nonspecific ST changes without ST elevation.  ____________________________________________    RADIOLOGY  X-ray negative  ____________________________________________   INITIAL IMPRESSION / ASSESSMENT AND PLAN / ED COURSE  Pertinent labs & imaging results that were available during my care of the patient were reviewed by me and considered in my medical decision making (see chart for details).  Patient presents the emergency department with fever and altered mental status. Patient  is altered hypoxic and hypotensive. Reported fever by nursing facility as well as EMS. Sepsis protocol is a been  initiated. We will dose 30 mL per kilogram fluid bolus in addition to empiric anabiotic's. Given the patient's new-onset hypoxia we will check a chest x-ray to rule out pneumonia. Patient will require admission to the hospital once her workup is complete. She remained somnolent but will awaken to verbal stimuli and will answer questions occasionally but to most questions she does not respond.  We were able to obtain 22-gauge IV in the patient. I used an ultrasound to guidean 18-gauge proximal left upper extremity basilic IV.  Family is now here with the patient. They state since the patient was discharged from the hospital proximal one week ago she has continued to deteriorate has never rebounded like she normally does. Patient's workup today is significant for an elevated troponin 0.18, urinalysis most consistent with urinary tract infection, and continued hypotension despite adequate IV fluids. I discussed the patient's grim prognosis with family members for suspected likely sepsis due to urinary tract infection. I discussed the use of pressors. They state the patient has been through a lot, she has a DO NOT RESUSCITATE order, and at this time they would like to hold off on doing pressors. They wish to continue IV antibiotics and admission to the hospital. Patient's current blood pressure is 95/42, with a 97% saturation heart rate of 60 bpm.  CRITICAL CARE Performed by: Harvest Dark   Total critical care time: 60 minutes  Critical care time was exclusive of separately billable procedures and treating other patients.  Critical care was necessary to treat or prevent imminent or life-threatening deterioration.  Critical care was time spent personally by me on the following activities: development of treatment plan with patient and/or surrogate as well as nursing, discussions with consultants, evaluation of patient's response to treatment, examination of patient, obtaining history from patient or  surrogate, ordering and performing treatments and interventions, ordering and review of laboratory studies, ordering and review of radiographic studies, pulse oximetry and re-evaluation of patient's condition.   ____________________________________________   FINAL CLINICAL IMPRESSION(S) / ED DIAGNOSES  Altered mental status Fever  Sepsis    Harvest Dark, MD 06/12/16 2155

## 2016-06-12 NOTE — ED Notes (Signed)
Verbal order from Dr. Kerman Passey to give antibiotics prior to obtaining urine sample.

## 2016-06-12 NOTE — Progress Notes (Signed)
Chaplain received a page to visit with pt in room ED8. Pt was unconscious. Chaplain met with family. Family said that the pt blood pressure was low. They did not know if they wanted to put pt through another surgery. Chaplain provided the ministry of emotional support.    06/12/16 2115  Clinical Encounter Type  Visited With Patient;Patient and family together  Visit Type Initial;Spiritual support  Referral From Nurse  Consult/Referral To Chaplain  Spiritual Encounters  Spiritual Needs Emotional

## 2016-06-12 NOTE — ED Notes (Signed)
Nicola Girt, RN attempted IV x2. Unsuccessful.

## 2016-06-12 NOTE — H&P (Signed)
[place] [facility] [place] NAME: Hailey [name]    MR#:  [id number]  DATE OF BIRTH:  1932/06/21  DATE OF ADMISSION:  06/12/2016  PRIMARY CARE PHYSICIAN: BABAOFF, Caryl Bis, MD   REQUESTING/REFERRING PHYSICIAN:   CHIEF COMPLAINT:   Chief Complaint  Patient presents with  . Hypotension    HISTORY OF PRESENT ILLNESS: Hailey [name]  is a 83 y.o. female with a known history of Anemia, arthritis, cerebral aneurysm, congestive heart failure, COPD, diverticulitis, DVT left lower extremity, IVC filter, not on Coumadin with history of GI bleed, fibromyalgia, GERD, gout, hyperlipidemia, hypertrophic cardiomyopathy, paroxysmal atrial fibrillation presented to the emergency room from rehabilitation facility. Patient was sent because of low blood pressure. Patient is also lethargic and confused. She has not been in her baseline mental status. Recently she was at our hospital was treated and sent to rehabilitation facility. Patient was given IV fluid bolus in the emergency room based on sepsis protocol. Family there is patient's son and daughter who have the power of attorney do not want any IV pressor medications. They just want IV antibiotics and fluids and no aggressive intervention. Patient is DO NOT RESUSCITATE by CODE STATUS. Patient not able to give any history because of lethargy and confusion.  PAST MEDICAL HISTORY:   Past Medical History:  Diagnosis Date  . Anemia    Chronic  . Anxiety   . Arthritis   . Asthma   . Atrophic vaginitis   . Cerebral aneurysm    Hx of  . CHF (congestive heart failure) ([place])   . COPD (chronic obstructive pulmonary disease) ([place])   . Diverticulitis   . DVT (deep vein thrombosis) in pregnancy ([place]) 09/2008   a. LLE, recurrent hx, has IVC filter. Not on coumadin now with history of GI bleeding  . Fibrocystic disease of breast   . Fibromyalgia   . GERD (gastroesophageal reflux disease)   . Gout   . Gross hematuria   . Heart  murmur   . History of colonoscopy   . Hyperlipidemia   . Hypertension   . Hypertrophic cardiomyopathy ([place])    a. Echo 4/09 with EF 70-75%, asymmetricy basal septal hypertrophy, mild MR without systolic anterior motion of the mitral valve, LVOT gradient to 130 mmHg with Valsalva b. Echo 7/13: EF 60-65%, mild focal basal septal hypertrophy, no significant LVOT gradient, no MV SAM c. Echo 2/15: EF 55-60%, HOCM, resting LVOT gradient 29 mmHg, Valsalva LVOT gradient > 140 mmHg, mild LVH, mild TR, elevated PASP  . Incomplete bladder emptying   . Migraine headache    Hx of  . Obesity   . OSA (obstructive sleep apnea)    a. Intolerant to CPAP, wears 2L via n/c  . Osteoarthritis    Hx of left TKR  . PAF (paroxysmal atrial fibrillation) ([place])    a. Full-dose ASA alone 2/2 h/o GIB.  Marland Kitchen PAT (paroxysmal atrial tachycardia) ([place])   . 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 Pneumonia   . PUD (peptic ulcer disease)    With GI bleeding  . Pulmonary embolism ([place])   . Tremor     PAST SURGICAL HISTORY: Past Surgical History:  Procedure Laterality Date  . ABDOMINAL HYSTERECTOMY    . APPENDECTOMY    . BLADDER SUSPENSION    . CARDIAC CATHETERIZATION  2009   No significant CAD  . CARDIOVASCULAR STRESS TEST     a. Lexiscan Myoview 3/14:  EF 76%, no evidence of ischemia or WMAs  . cataract surgery    . CEREBRAL ANEURYSM REPAIR  1998  . CORONARY STENT INTERVENTION N/A 05/01/2016   Procedure: Coronary Stent Intervention;  Surgeon: Yolonda Kida, MD;  Location: [facility];  Service: Cardiovascular;  Laterality: N/A;  . ESOPHAGOGASTRODUODENOSCOPY (EGD) WITH PROPOFOL N/A 11/24/2015   Procedure: ESOPHAGOGASTRODUODENOSCOPY (EGD) WITH PROPOFOL;  Surgeon: Lollie Sails, MD;  Location: [facility] ENDOSCOPY;  Service: Endoscopy;  Laterality: N/A;  . FOOT SURGERY     Bilateral  . INGUINAL HERNIA REPAIR  1968   Right  . KNEE ARTHROSCOPY  2009   Right  . LEFT HEART  CATH AND CORONARY ANGIOGRAPHY N/A 05/01/2016   Procedure: Left Heart Cath and Coronary Angiography;  Surgeon: Yolonda Kida, MD;  Location: [facility];  Service: Cardiovascular;  Laterality: N/A;  . SHOULDER SURGERY  1972   Right   Left 2012  . TONSILLECTOMY AND ADENOIDECTOMY    . TOTAL KNEE ARTHROPLASTY     Left    SOCIAL HISTORY:  Social History  Substance Use Topics  . Smoking status: Former Smoker    Packs/day: 1.00    Years: 60.00    Types: Cigarettes    Quit date: 12/15/2008  . Smokeless tobacco: Never Used     Comment: smoked for 50 years quit 5 + years  . Alcohol use 0.0 oz/week     Comment: Socially    FAMILY HISTORY:  Family History  Problem Relation Age of Onset  . Pancreatic cancer Mother   . Hypertension Mother     father  . Diabetes Mellitus II Mother     Sister  . Colon cancer Father   . Colon polyps Father   . Heart disease Father   . Stroke Father   . Heart disease Sister     More than 1 sister  . Colon cancer Brother   . Colon polyps Other     Siblings  . Ulcers Brother     DRUG ALLERGIES:  Allergies  Allergen Reactions  . Aspirin Other (See Comments)    Reaction:  GI bleeding   . Codeine Nausea And Vomiting  . Compazine [Prochlorperazine] Other (See Comments)    Confusion  . Ketorolac Tromethamine Other (See Comments)    Reaction:  Headache   . Nsaids Other (See Comments)    Reaction:  GI bleeding   . Phenazopyridine Hcl Other (See Comments)    Reaction:  Vision problems   . Macrodantin [Nitrofurantoin Macrocrystal] Rash    REVIEW OF SYSTEMS:  Could not be obtanied  MEDICATIONS AT HOME:  Prior to Admission medications   Medication Sig Start Date End Date Taking? Authorizing Provider  acetaminophen (TYLENOL) 325 MG tablet Take 2 tablets (650 mg total) by mouth every 6 (six) hours as needed for mild pain or moderate pain (temp > 101.5). 05/25/15  Yes Nicholes Mango, MD  albuterol (PROVENTIL HFA;VENTOLIN HFA) 108 (90 Base)  MCG/ACT inhaler Inhale 1 puff into the lungs every 6 (six) hours as needed for wheezing or shortness of breath.   Yes Historical Provider, MD  albuterol (PROVENTIL) (2.5 MG/3ML) 0.083% nebulizer solution Take 3 mLs (2.5 mg total) by nebulization every 4 (four) hours as needed for wheezing or shortness of breath. 05/25/15  Yes Nicholes Mango, MD  amiodarone (PACERONE) 200 MG tablet Take 1 tablet (200 mg total) by mouth daily. Pt is able to take an additional tablet if needed for breakthrough arrhythmias. 08/04/15  Yes Minna Merritts, MD  aspirin EC 81 MG EC tablet Take 1 tablet (81 mg total) by mouth daily. 05/06/16  Yes Vipul Manuella Ghazi, MD  atorvastatin (LIPITOR) 80 MG tablet Take 1 tablet (80 mg total) by mouth daily at 6 PM. 05/06/16  Yes Vipul Manuella Ghazi, MD  carbidopa-levodopa (SINEMET IR) 25-250 MG tablet Take 1 tablet by mouth 4 (four) times daily. 05/06/16  Yes Vipul Manuella Ghazi, MD  chlorhexidine (PERIDEX) 0.12 % solution 15 mLs by Mouth Rinse route 2 (two) times daily. 04/14/16  Yes Historical Provider, MD  diazepam (VALIUM) 2 MG tablet Take 1 tablet (2 mg total) by mouth every 6 (six) hours as needed for anxiety (or back pain). 05/06/16  Yes Vipul Manuella Ghazi, MD  dimenhyDRINATE (DRAMAMINE) 50 MG tablet Take 25-50 mg by mouth every 6 (six) hours as needed for dizziness.    Yes Historical Provider, MD  docusate sodium (COLACE) 100 MG capsule Take 100 mg by mouth 2 (two) times daily as needed for mild constipation.   Yes Historical Provider, MD  DULoxetine (CYMBALTA) 30 MG capsule Take 3 capsules (90 mg total) by mouth daily. 05/07/16  Yes Vipul Manuella Ghazi, MD  fluticasone (FLONASE) 50 MCG/ACT nasal spray Place 2 sprays into both nostrils daily as needed for rhinitis.   Yes Historical Provider, MD  gabapentin (NEURONTIN) 100 MG capsule Take 200-400 mg by mouth 3 (three) times daily. Pt takes three capsules in the morning, two capsules in the evening, and four capsules at bedtime.   Yes Historical Provider, MD  isosorbide mononitrate  (IMDUR) 30 MG 24 hr tablet Take 1 tablet (30 mg total) by mouth daily. 05/06/16  Yes Vipul Manuella Ghazi, MD  levothyroxine (SYNTHROID, LEVOTHROID) 50 MCG tablet Take 50 mcg by mouth daily before breakfast.    Yes Historical Provider, MD  losartan (COZAAR) 25 MG tablet Take 25 mg by mouth daily. 11/27/15  Yes Historical Provider, MD  mometasone-formoterol (DULERA) 100-5 MCG/ACT AERO Inhale 2 puffs into the lungs 2 (two) times daily. 05/25/15  Yes Nicholes Mango, MD  montelukast (SINGULAIR) 10 MG tablet Take 10 mg by mouth at bedtime.   Yes Historical Provider, MD  nystatin (MYCOSTATIN) 100000 UNIT/ML suspension Take 5 mLs by mouth 2 (two) times daily. 06/10/16 06/17/16 Yes Historical Provider, MD  ondansetron (ZOFRAN-ODT) 4 MG disintegrating tablet Take 4 mg by mouth every 8 (eight) hours as needed for nausea or vomiting.   Yes Historical Provider, MD  oxyCODONE (OXY IR/ROXICODONE) 5 MG immediate release tablet Take 1 tablet (5 mg total) by mouth every 6 (six) hours as needed for moderate pain or severe pain. 05/06/16  Yes Vipul Manuella Ghazi, MD  pantoprazole (PROTONIX) 40 MG tablet Take 40 mg by mouth daily.   Yes Historical Provider, MD  polyethylene glycol (MIRALAX / GLYCOLAX) packet Take 17 g by mouth daily as needed for moderate constipation.    Yes Historical Provider, MD  potassium chloride (K-DUR,KLOR-CON) 10 MEQ tablet Take 1 tablet (10 mEq total) by mouth 3 (three) times daily. 05/27/16  Yes Minna Merritts, MD  senna (SENOKOT) 8.6 MG TABS tablet Take 2 tablets (17.2 mg total) by mouth 2 (two) times daily. 05/25/15  Yes Nicholes Mango, MD  sucralfate (CARAFATE) 1 g tablet Take 1 g by mouth 2 (two) times daily. 03/21/16  Yes Historical Provider, MD  ticagrelor (BRILINTA) 90 MG TABS tablet Take 1 tablet (90 mg total) by mouth 2 (two) times daily. 05/06/16  Yes Vipul Manuella Ghazi, MD  tiotropium (SPIRIVA) 18 MCG inhalation capsule Place  18 mcg into inhaler and inhale daily.   Yes Historical Provider, MD  torsemide (DEMADEX) 20 MG  tablet Take 1 tablet (20 mg total) by mouth 2 (two) times daily. 10/24/15  Yes Gladstone Lighter, MD  traZODone (DESYREL) 50 MG tablet Take 25 mg by mouth at bedtime.   Yes Historical Provider, MD  triamcinolone ointment (KENALOG) 0.1 % Apply 1 application topically 2 (two) times daily as needed (for rash).    Yes Historical Provider, MD  feeding supplement, ENSURE ENLIVE, (ENSURE ENLIVE) LIQD Take 237 mLs by mouth 2 (two) times daily between meals. 01/28/15   Bettey Costa, MD      PHYSICAL EXAMINATION:   VITAL SIGNS: Blood pressure (!) 109/46, pulse (!) 56, temperature 99 F (37.2 C), temperature source Oral, resp. rate 18, height 5\' 1"  (1.549 m), weight 72.6 kg (160 lb), SpO2 100 %.  GENERAL:  83 y.o.-year-old patient lying in the bed  EYES: Pupils equal, round, reactive to light and accommodation. No scleral icterus. Extraocular muscles intact.  HEENT: Head atraumatic, normocephalic. Oropharynx dry and nasopharynx clear.  NECK:  Supple, no jugular venous distention. No thyroid enlargement, no tenderness.  LUNGS: Normal breath sounds bilaterally, no wheezing, rales,rhonchi or crepitation. No use of accessory muscles of respiration.  CARDIOVASCULAR: S1, S2 normal. No murmurs, rubs, or gallops.  ABDOMEN: Soft, nontender, nondistended. Bowel sounds present. No organomegaly or mass.  EXTREMITIES: No pedal edema, cyanosis, or clubbing.  NEUROLOGIC: Not oriented to time, place and person. Moves all extremities PSYCHIATRIC: could not be assessed SKIN: No obvious rash, lesion, or ulcer.   LABORATORY PANEL:   CBC  Recent Labs Lab 06/12/16 1845  WBC 25.5*  HGB 9.6*  HCT 28.0*  PLT 405  MCV 90.5  MCH 31.0  MCHC 34.3  RDW 14.2  LYMPHSABS 0.8*  MONOABS 1.7*  EOSABS 0.0  BASOSABS 0.0   ------------------------------------------------------------------------------------------------------------------  Chemistries   Recent Labs Lab 06/12/16 1845  NA 134*  K 3.8  CL 99*  CO2 24   GLUCOSE 108*  BUN 11  CREATININE 1.10*  CALCIUM 8.8*  AST 27  ALT 7*  ALKPHOS 99  BILITOT 0.9   ------------------------------------------------------------------------------------------------------------------ estimated creatinine clearance is 36.5 mL/min (A) (by C-G formula based on SCr of 1.1 mg/dL (H)). ------------------------------------------------------------------------------------------------------------------ No results for input(s): TSH, T4TOTAL, T3FREE, THYROIDAB in the last 72 hours.  Invalid input(s): FREET3   Coagulation profile No results for input(s): INR, PROTIME in the last 168 hours. ------------------------------------------------------------------------------------------------------------------- No results for input(s): DDIMER in the last 72 hours. -------------------------------------------------------------------------------------------------------------------  Cardiac Enzymes  Recent Labs Lab 06/12/16 1845  TROPONINI 0.18*   ------------------------------------------------------------------------------------------------------------------ Invalid input(s): POCBNP  ---------------------------------------------------------------------------------------------------------------  Urinalysis    Component Value Date/Time   COLORURINE YELLOW (A) 06/12/2016 1921   APPEARANCEUR HAZY (A) 06/12/2016 1921   APPEARANCEUR Hazy (A) 08/07/2014 1124   LABSPEC 1.008 06/12/2016 1921   LABSPEC 1.014 01/14/2014 1543   PHURINE 6.0 06/12/2016 1921   GLUCOSEU NEGATIVE 06/12/2016 1921   GLUCOSEU >=500 01/14/2014 1543   GLUCOSEU NEGATIVE 09/17/2008 1107   HGBUR NEGATIVE 06/12/2016 1921   BILIRUBINUR NEGATIVE 06/12/2016 1921   BILIRUBINUR Negative 08/07/2014 1124   BILIRUBINUR Negative 01/14/2014 1543   KETONESUR NEGATIVE 06/12/2016 1921   PROTEINUR NEGATIVE 06/12/2016 1921   UROBILINOGEN 0.2 09/17/2008 1107   NITRITE NEGATIVE 06/12/2016 1921   LEUKOCYTESUR SMALL  (A) 06/12/2016 1921   LEUKOCYTESUR 1+ (A) 08/07/2014 1124   LEUKOCYTESUR Negative 01/14/2014 1543     RADIOLOGY: Dg Chest Port 1 View  Result Date:  06/12/2016 CLINICAL DATA:  Hypoxia EXAM: PORTABLE CHEST 1 VIEW COMPARISON:  06/01/16 FINDINGS: Cardiac shadow is within normal limits. The lungs are well aerated bilaterally. No focal infiltrate or sizable effusion is seen. No bony abnormality is noted. IMPRESSION: No acute abnormality noted. Electronically Signed   By: Inez Catalina M.D.   On: 06/12/2016 19:16    EKG: Orders placed or performed during the hospital encounter of 06/12/16  . EKG 12-Lead  . EKG 12-Lead  . ED EKG 12-Lead  . ED EKG 12-Lead  . EKG 12-Lead  . EKG 12-Lead    IMPRESSION AND PLAN: 83 year old elderly female patient with history of cardiomyopathy, proximal atrial fibrillation, anemia, congestive heart failure, COPD presented to the emergency room from rehabilitation facility for low blood pressure and confusion. Admitting diagnosis 1. Sepsis 2. Altered mental status 3. Hypotension 4. Leukocytosis 5. Anemia of chronic disease 6. Cardiomyopathy Treatment plan Admit patient to medical floor DO NOT RESUSCITATE by CODE STATUS IV fluid hydration Patient's family do not want any IV pressor medication Start patient on IV vancomycin and IV Zosyn antibiotics Follow-up WBC count and hemoglobin Keep patient nothing by mouth for now High risk of morbidity and mortality All the records are reviewed and case discussed with ED provider. Management plans discussed with the patient, family and they are in agreement.  CODE STATUS:DNR Code Status History    Date Active Date Inactive Code Status Order ID Comments User Context   06/01/2016  3:09 AM 06/03/2016  9:36 PM DNR [id number]  Saundra Shelling, MD Inpatient   06/01/2016  2:11 AM 06/01/2016  3:09 AM Full Code [id number]  Harvie Bridge, DO Inpatient   05/01/2016  6:24 AM 05/06/2016  8:40 PM DNR [id number]  Saundra Shelling, MD  Inpatient   05/01/2016  5:19 AM 05/01/2016  6:24 AM Full Code [id number]  Yolonda Kida, MD Inpatient   10/22/2015  8:40 PM 10/24/2015 11:40 PM Full Code [id number]  Vaughan Basta, MD Inpatient   05/21/2015 10:34 AM 05/25/2015  5:36 PM DNR [id number]  Flora Lipps, MD Inpatient   05/19/2015  4:56 PM 05/21/2015 10:34 AM Full Code [id number]  Flora Lipps, MD ED   01/27/2015  5:23 PM 01/28/2015  4:52 PM Full Code [id number]  [name], MD ED    Questions for Most Recent Historical Code Status (Order [id number])    Question Answer Comment   In the event of cardiac or respiratory ARREST Do not call a "code blue"    In the event of cardiac or respiratory ARREST Do not perform Intubation, CPR, defibrillation or ACLS    In the event of cardiac or respiratory ARREST Use medication by any route, position, wound care, and other measures to relive pain and suffering. May use oxygen, suction and manual treatment of airway obstruction as needed for comfort.         Advance Directive Documentation     Most Recent Value  Type of Advance Directive  Out of facility DNR (pink MOST or yellow form)  Pre-existing out of facility DNR order (yellow form or pink MOST form)  Physician notified to receive inpatient order, Yellow form placed in chart (order not valid for inpatient use)  "MOST" Form in Place?  -       TOTAL TIME TAKING CARE OF THIS PATIENT: 50 minutes.    Saundra Shelling M.D on 06/12/2016 at 11:44 PM  Between 7am to 6pm - Pager - [phone]  After 6pm go to [email or web address] - password [email or web address] [facility]  [place]  Office  [phone]  CC: Primary care physician; BABAOFF, Caryl Bis, MD

## 2016-06-13 ENCOUNTER — Inpatient Hospital Stay (HOSPITAL_COMMUNITY)
Admit: 2016-06-13 | Discharge: 2016-06-13 | Disposition: A | Discharge status: Home / Self Care | Payer: Medicare HMO | Attending: Physician Assistant | Admitting: Physician Assistant

## 2016-06-13 ENCOUNTER — Inpatient Hospital Stay: Payer: Medicare HMO

## 2016-06-13 DIAGNOSIS — R748 Abnormal levels of other serum enzymes: Secondary | ICD-10-CM

## 2016-06-13 DIAGNOSIS — R7989 Other specified abnormal findings of blood chemistry: Secondary | ICD-10-CM

## 2016-06-13 DIAGNOSIS — Z7189 Other specified counseling: Secondary | ICD-10-CM

## 2016-06-13 DIAGNOSIS — Z515 Encounter for palliative care: Secondary | ICD-10-CM

## 2016-06-13 DIAGNOSIS — A419 Sepsis, unspecified organism: Secondary | ICD-10-CM

## 2016-06-13 LAB — BLOOD CULTURE ID PANEL (REFLEXED)
Acinetobacter baumannii: NOT DETECTED
CANDIDA GLABRATA: NOT DETECTED
CANDIDA KRUSEI: NOT DETECTED
CANDIDA PARAPSILOSIS: NOT DETECTED
Candida albicans: NOT DETECTED
Candida tropicalis: NOT DETECTED
Carbapenem resistance: NOT DETECTED
ENTEROCOCCUS SPECIES: NOT DETECTED
ESCHERICHIA COLI: DETECTED — AB
Enterobacter cloacae complex: NOT DETECTED
Enterobacteriaceae species: DETECTED — AB
Haemophilus influenzae: NOT DETECTED
KLEBSIELLA OXYTOCA: NOT DETECTED
KLEBSIELLA PNEUMONIAE: NOT DETECTED
LISTERIA MONOCYTOGENES: NOT DETECTED
Neisseria meningitidis: NOT DETECTED
PROTEUS SPECIES: NOT DETECTED
Pseudomonas aeruginosa: NOT DETECTED
SERRATIA MARCESCENS: NOT DETECTED
STREPTOCOCCUS PNEUMONIAE: NOT DETECTED
Staphylococcus aureus (BCID): NOT DETECTED
Staphylococcus species: NOT DETECTED
Streptococcus agalactiae: NOT DETECTED
Streptococcus pyogenes: NOT DETECTED
Streptococcus species: NOT DETECTED

## 2016-06-13 LAB — MRSA PCR SCREENING: MRSA BY PCR: NEGATIVE

## 2016-06-13 LAB — CBC
HEMATOCRIT: 25.7 % — AB (ref 35.0–47.0)
HEMOGLOBIN: 8.6 g/dL — AB (ref 12.0–16.0)
MCH: 30.6 pg (ref 26.0–34.0)
MCHC: 33.5 g/dL (ref 32.0–36.0)
MCV: 91.5 fL (ref 80.0–100.0)
Platelets: 333 10*3/uL (ref 150–440)
RBC: 2.8 MIL/uL — ABNORMAL LOW (ref 3.80–5.20)
RDW: 14.5 % (ref 11.5–14.5)
WBC: 15.7 10*3/uL — ABNORMAL HIGH (ref 3.6–11.0)

## 2016-06-13 LAB — TROPONIN I
Troponin I: 0.27 ng/mL (ref <0.03)
Troponin I: 0.27 ng/mL (ref <0.03)
Troponin I: 0.3 ng/mL (ref <0.03)

## 2016-06-13 LAB — BASIC METABOLIC PANEL
Anion gap: 8 (ref 5–15)
BUN: 11 mg/dL (ref 6–20)
CO2: 25 mmol/L (ref 22–32)
Calcium: 8.5 mg/dL — ABNORMAL LOW (ref 8.9–10.3)
Chloride: 103 mmol/L (ref 101–111)
Creatinine, Ser: 1.08 mg/dL — ABNORMAL HIGH (ref 0.44–1.00)
GFR calc Af Amer: 54 mL/min — ABNORMAL LOW (ref >60)
GFR, EST NON AFRICAN AMERICAN: 47 mL/min — AB (ref >60)
GLUCOSE: 106 mg/dL — AB (ref 65–99)
POTASSIUM: 3.9 mmol/L (ref 3.5–5.1)
Sodium: 136 mmol/L (ref 135–145)

## 2016-06-13 LAB — TSH: TSH: 1.337 u[IU]/mL (ref 0.350–4.500)

## 2016-06-13 LAB — ECHOCARDIOGRAM COMPLETE
Height: 61 in
WEIGHTICAEL: 2560 [oz_av]

## 2016-06-13 LAB — MAGNESIUM: MAGNESIUM: 1.8 mg/dL (ref 1.7–2.4)

## 2016-06-13 MED ORDER — MEROPENEM-SODIUM CHLORIDE 1 GM/50ML IV SOLR
1.0000 g | Freq: Two times a day (BID) | INTRAVENOUS | Status: DC
  Administered 2016-06-13 – 2016-06-15 (×5): 1 g via INTRAVENOUS
  Filled 2016-06-13 (×6): qty 50

## 2016-06-13 MED ORDER — SODIUM CHLORIDE 0.9 % IV SOLN
Freq: Once | INTRAVENOUS | Status: AC
  Administered 2016-06-13: 08:00:00 via INTRAVENOUS

## 2016-06-13 MED ORDER — ONDANSETRON HCL 4 MG PO TABS
4.0000 mg | ORAL_TABLET | Freq: Four times a day (QID) | ORAL | Status: DC | PRN
  Administered 2016-06-19: 09:00:00 4 mg via ORAL
  Filled 2016-06-13: qty 1

## 2016-06-13 MED ORDER — AMIODARONE HCL 200 MG PO TABS
200.0000 mg | ORAL_TABLET | Freq: Every day | ORAL | Status: DC
  Filled 2016-06-13 (×2): qty 1

## 2016-06-13 MED ORDER — PANTOPRAZOLE SODIUM 40 MG PO TBEC
40.0000 mg | DELAYED_RELEASE_TABLET | Freq: Every day | ORAL | Status: DC
  Administered 2016-06-14 – 2016-06-19 (×6): 40 mg via ORAL
  Filled 2016-06-13 (×7): qty 1

## 2016-06-13 MED ORDER — ACETAMINOPHEN 325 MG PO TABS
650.0000 mg | ORAL_TABLET | Freq: Four times a day (QID) | ORAL | Status: DC | PRN
  Administered 2016-06-13 – 2016-06-17 (×2): 650 mg via ORAL
  Filled 2016-06-13 (×2): qty 2

## 2016-06-13 MED ORDER — SODIUM CHLORIDE 0.9 % IV SOLN
1.0000 g | Freq: Two times a day (BID) | INTRAVENOUS | Status: DC
  Filled 2016-06-13 (×2): qty 1

## 2016-06-13 MED ORDER — LORAZEPAM 2 MG/ML IJ SOLN
1.0000 mg | Freq: Once | INTRAMUSCULAR | Status: AC
  Administered 2016-06-13: 1 mg via INTRAVENOUS

## 2016-06-13 MED ORDER — ORAL CARE MOUTH RINSE
15.0000 mL | Freq: Two times a day (BID) | OROMUCOSAL | Status: DC
  Administered 2016-06-13 – 2016-06-20 (×13): 15 mL via OROMUCOSAL

## 2016-06-13 MED ORDER — ASPIRIN EC 81 MG PO TBEC
81.0000 mg | DELAYED_RELEASE_TABLET | Freq: Every day | ORAL | Status: DC
  Administered 2016-06-14 – 2016-06-19 (×6): 81 mg via ORAL
  Filled 2016-06-13 (×7): qty 1

## 2016-06-13 MED ORDER — FLUTICASONE PROPIONATE 50 MCG/ACT NA SUSP
2.0000 | Freq: Every day | NASAL | Status: DC | PRN
  Filled 2016-06-13: qty 16

## 2016-06-13 MED ORDER — ALBUTEROL SULFATE (2.5 MG/3ML) 0.083% IN NEBU: 2.5000 mg | INHALATION_SOLUTION | RESPIRATORY_TRACT | Status: DC | PRN

## 2016-06-13 MED ORDER — MOMETASONE FURO-FORMOTEROL FUM 100-5 MCG/ACT IN AERO
2.0000 | INHALATION_SPRAY | Freq: Two times a day (BID) | RESPIRATORY_TRACT | Status: DC
  Administered 2016-06-13 – 2016-06-16 (×6): 2 via RESPIRATORY_TRACT
  Filled 2016-06-13: qty 8.8

## 2016-06-13 MED ORDER — ACETAMINOPHEN 650 MG RE SUPP: 650.0000 mg | Freq: Four times a day (QID) | RECTAL | Status: DC | PRN

## 2016-06-13 MED ORDER — ONDANSETRON HCL 4 MG/2ML IJ SOLN
4.0000 mg | Freq: Four times a day (QID) | INTRAMUSCULAR | Status: DC | PRN
  Administered 2016-06-17 – 2016-06-18 (×3): 4 mg via INTRAVENOUS
  Filled 2016-06-13 (×3): qty 2

## 2016-06-13 MED ORDER — LORAZEPAM 2 MG/ML IJ SOLN
INTRAMUSCULAR | Status: AC
  Administered 2016-06-13: 1 mg via INTRAVENOUS
  Filled 2016-06-13: qty 1

## 2016-06-13 MED ORDER — SODIUM CHLORIDE 0.9% FLUSH
3.0000 mL | Freq: Two times a day (BID) | INTRAVENOUS | Status: DC
  Administered 2016-06-13 – 2016-06-19 (×16): 3 mL via INTRAVENOUS

## 2016-06-13 MED ORDER — SODIUM CHLORIDE 0.9 % IV SOLN
INTRAVENOUS | Status: DC
  Administered 2016-06-13 – 2016-06-14 (×3): via INTRAVENOUS

## 2016-06-13 MED ORDER — DIAZEPAM 1 MG/ML PO SOLN: 2.0000 mg | Freq: Four times a day (QID) | ORAL | Status: DC

## 2016-06-13 MED ORDER — TIOTROPIUM BROMIDE MONOHYDRATE 18 MCG IN CAPS
18.0000 ug | ORAL_CAPSULE | Freq: Every day | RESPIRATORY_TRACT | Status: DC
  Administered 2016-06-14 – 2016-06-16 (×3): 18 ug via RESPIRATORY_TRACT
  Filled 2016-06-13: qty 5

## 2016-06-13 MED ORDER — ENOXAPARIN SODIUM 40 MG/0.4ML ~~LOC~~ SOLN
40.0000 mg | SUBCUTANEOUS | Status: DC
  Administered 2016-06-13 – 2016-06-20 (×8): 40 mg via SUBCUTANEOUS
  Filled 2016-06-13 (×8): qty 0.4

## 2016-06-13 MED ORDER — ALBUTEROL SULFATE (2.5 MG/3ML) 0.083% IN NEBU: 2.5000 mg | INHALATION_SOLUTION | Freq: Four times a day (QID) | RESPIRATORY_TRACT | Status: DC | PRN

## 2016-06-13 MED ORDER — CARBIDOPA-LEVODOPA 25-250 MG PO TABS
1.0000 | ORAL_TABLET | Freq: Four times a day (QID) | ORAL | Status: DC
  Administered 2016-06-13 – 2016-06-14 (×6): 1 via ORAL
  Filled 2016-06-13 (×8): qty 1

## 2016-06-13 MED ORDER — MORPHINE SULFATE (PF) 2 MG/ML IV SOLN
1.0000 mg | INTRAVENOUS | Status: DC | PRN
  Administered 2016-06-13 – 2016-06-19 (×15): 1 mg via INTRAVENOUS
  Filled 2016-06-13 (×15): qty 1

## 2016-06-13 MED ORDER — ATORVASTATIN CALCIUM 20 MG PO TABS
80.0000 mg | ORAL_TABLET | Freq: Every day | ORAL | Status: DC
  Administered 2016-06-13 – 2016-06-19 (×7): 80 mg via ORAL
  Filled 2016-06-13 (×6): qty 4
  Filled 2016-06-13: qty 1
  Filled 2016-06-13: qty 4

## 2016-06-13 MED ORDER — LEVOTHYROXINE SODIUM 50 MCG PO TABS
50.0000 ug | ORAL_TABLET | Freq: Every day | ORAL | Status: DC
  Administered 2016-06-14 – 2016-06-19 (×6): 50 ug via ORAL
  Filled 2016-06-13 (×7): qty 1

## 2016-06-13 MED ORDER — MAGNESIUM SULFATE 2 GM/50ML IV SOLN
2.0000 g | Freq: Once | INTRAVENOUS | Status: AC
  Administered 2016-06-13: 08:00:00 2 g via INTRAVENOUS
  Filled 2016-06-13: qty 50

## 2016-06-13 MED ORDER — TICAGRELOR 90 MG PO TABS
90.0000 mg | ORAL_TABLET | Freq: Two times a day (BID) | ORAL | Status: AC
  Administered 2016-06-13 – 2016-06-14 (×4): 90 mg via ORAL
  Filled 2016-06-13 (×6): qty 1

## 2016-06-13 MED ORDER — LORAZEPAM 2 MG/ML IJ SOLN
0.5000 mg | INTRAMUSCULAR | Status: DC | PRN
  Administered 2016-06-14: 02:00:00 0.5 mg via INTRAVENOUS
  Filled 2016-06-13: qty 1

## 2016-06-13 NOTE — Progress Notes (Signed)
[place] [facility] [place] NAME: Hailey [name]    MR#:  [id number]  DATE OF BIRTH:  July 19, 1932  SUBJECTIVE:  CHIEF COMPLAINT:   Chief Complaint  Patient presents with  . Hypotension     Recent admission with Escherichia coli bacteremia and UTI, sent to rehabilitation with oral antibiotic depending on sensitivities which she finished and sent back again with altered mental status and hypotension   Found to have urosepsis again with UTI.   Blood pressure responded to IV fluids, currently on broad-spectrum IV antibiotics.   06/13/16 morning patient had episode of excessive shaking and was noted to have recording of V. tach on cardiac monitoring.   After giving IV Ativan injection patient seems to be more comfortable.  REVIEW OF SYSTEMS:   Patient is alert but appears to have some dementia and not able to give any review of system to me.  ROS  DRUG ALLERGIES:   Allergies  Allergen Reactions  . Aspirin Other (See Comments)    Reaction:  GI bleeding   . Codeine Nausea And Vomiting  . Compazine [Prochlorperazine] Other (See Comments)    Confusion  . Ketorolac Tromethamine Other (See Comments)    Reaction:  Headache   . Nsaids Other (See Comments)    Reaction:  GI bleeding   . Phenazopyridine Hcl Other (See Comments)    Reaction:  Vision problems   . Macrodantin [Nitrofurantoin Macrocrystal] Rash    VITALS:  Blood pressure (!) 127/103, pulse 95, temperature 97.5 F (36.4 C), temperature source Oral, resp. rate 17, height 5\' 1"  (1.549 m), weight 72.6 kg (160 lb), SpO2 98 %.  PHYSICAL EXAMINATION:   I have examined the patient during the rapid response and after 30 minutes of giving injection Ativan.  GENERAL:  83 y.o.-year-old patient lying in the bed with no acute distress. Head excessive shaking earlier but appears calm after 30 minutes. EYES: Pupils equal, round, reactive to light and accommodation. No scleral icterus. Extraocular muscles  intact.  HEENT: Head atraumatic, normocephalic. Oropharynx and nasopharynx clear.  NECK:  Supple, no jugular venous distention. No thyroid enlargement, no tenderness.  LUNGS: Normal breath sounds bilaterally, no wheezing, rales,rhonchi or crepitation. No use of accessory muscles of respiration.  CARDIOVASCULAR: S1, S2 fast. No murmurs, rubs, or gallops.  ABDOMEN: Soft, nontender, nondistended. Bowel sounds present. No organomegaly or mass.  EXTREMITIES: No pedal edema, cyanosis, or clubbing.  NEUROLOGIC: Cranial nerves II through XII are intact. Muscle strength 3-4/5 in all extremities. Sensation and Gait not checked. She had excessive shaking movements of unclear but later on after Ativan injection appears more stabilized and just minimal tremors. PSYCHIATRIC: The patient is alert and not oriented x 3.  SKIN: No obvious rash, lesion, or ulcer.   Physical Exam LABORATORY PANEL:   CBC  Recent Labs Lab 06/13/16 0312  WBC 15.7*  HGB 8.6*  HCT 25.7*  PLT 333   ------------------------------------------------------------------------------------------------------------------  Chemistries   Recent Labs Lab 06/12/16 1845 06/13/16 0312  NA 134* 136  K 3.8 3.9  CL 99* 103  CO2 24 25  GLUCOSE 108* 106*  BUN 11 11  CREATININE 1.10* 1.08*  CALCIUM 8.8* 8.5*  AST 27  --   ALT 7*  --   ALKPHOS 99  --   BILITOT 0.9  --    ------------------------------------------------------------------------------------------------------------------  Cardiac Enzymes  Recent Labs Lab 06/12/16 1845 06/13/16 0312  TROPONINI 0.18* 0.27*   ------------------------------------------------------------------------------------------------------------------  RADIOLOGY:  Dg Chest Port 1  View  Result Date: 06/12/2016 CLINICAL DATA:  Hypoxia EXAM: PORTABLE CHEST 1 VIEW COMPARISON:  06/01/16 FINDINGS: Cardiac shadow is within normal limits. The lungs are well aerated bilaterally. No focal infiltrate or  sizable effusion is seen. No bony abnormality is noted. IMPRESSION: No acute abnormality noted. Electronically Signed   By: Inez Catalina M.D.   On: 06/12/2016 19:16    ASSESSMENT AND PLAN:   Active Problems:   Altered mental status   Sepsis ([place])  * Sepsis with UTI   Recently treated for Escherichia coli bacteremia and UTI, blood culture is sent in this admission still in process.   Possibly this episode appears to be continuation from the last episode.   She was on appropriate antibiotics at the time of discharge as per the culture results available.   She was not checked for any pathology on urinary tract or stones during last admission so I will check for that as this is repeated presentations for the same issue. We'll get CT scan renal study.   IV Zosyn and vancomycin for now, check MRSA screening and if negative we will DC vancomycin.   IV fluids, blood pressure is under control.  * Ventricular tachycardia   This was noted on cardiac monitoring, appears to be artifact as this was while the patient had excessive shaking.   Her vitals were stable during this episode, I spoke to patient's daughter and she understands and would like to keep patient DO NOT RESUSCITATE in any adverse event and denies use of CPR or cardioversion.   She approved using medications, IV fluid, IV antibiotics.   I will give magnesium IV and physical for now and check for her magnesium level from the sample today morning.   after giving injection Ativan patient appears more comfortable now and her heart rate is more regular, in sinus rhythm.   Cardiology consult was called in.  * Elevated troponin   Most likely this is demand ischemia secondary to sepsis, nontender coagulation at this time, cardiology consult.  *  History of coronary artery disease   Continue aspirin, brilanta, atorvastatin.  * Parkinson's disease   Continue carbidopa and levodopa.    * Hypothyroidism    continue levothyroxine, check  TSH.   All the records are reviewed and case discussed with Care Management/Social Workerr. Management plans discussed with the patient, family and they are in agreement.  CODE STATUS: DNR  TOTAL TIME TAKING CARE OF THIS PATIENT: 50 critical care minutes.    I spoke with cardiologist on-call and patient's daughter, also discussed with the nursing staff for about further management.   POSSIBLE D/C IN 2-3 DAYS, DEPENDING ON CLINICAL CONDITION.   Vaughan Basta M.D on 06/13/2016   Between 7am to 6pm - Pager - [phone]  After 6pm go to [email or web address] - password [email or web address] [facility]  Office  [phone]  CC: Primary care physician; BABAOFF, Caryl Bis, MD  Note: This dictation was prepared with Dragon dictation along with smaller phrase technology. Any transcriptional errors that result from this process are unintentional.

## 2016-06-13 NOTE — Progress Notes (Signed)
No charge note.  Palliative consult received-  Spoke briefly with patient's daughter- Hailey [name].  Main concern and GOC are comfort. Patient would not want extraordinary measures, invasive testing or procedures.   Evaluated patient and spoke with Hailey Kidney, RN- patient appears uncomfortable, grimacing at times. Patient has history of opioid use. Ordered morphine 1mg  q 1hr prn for moderate discomfort, sob, or agitation.  Hailey [name] feels as though her mother is not able to fight this infection and is considering request to transition care to comfort measures pending results of CT scan.  In person meeting planned for 3pm today.  Hailey [name], AGNP-C Palliative Medicine  Please call Palliative Medicine team phone with any questions [phone]. For individual providers please see AMION.

## 2016-06-13 NOTE — Plan of Care (Signed)
Called into pt's room by nurse tech who'd been called by central tele.  Pt is shaking uncontrollably.  BP and HR elevated.  Called respiratory b/c O2 sats were 79 - called Rapid Response.  Pt is DNR - gave fluids wide open - and 1 mg ativan.  Dr is concerned this is bacteremia.  WBCs have decreased and lactic acid was 1.2 yesterday.  She is tachypnic w/accessory muscle use.  Patient is very pale.  Applied warm blanket and she is shaking less after the ativan. Daughter was called and Dr. Anselm Jungling will talk to her upon her arrival for plan of care.

## 2016-06-13 NOTE — Plan of Care (Signed)
Pt has not voided all day despite receiving fluids.  Did a bladder scan and she's retaining urine.  Bladder scan registered >997.  Called [facility] and Dr. Ether Griffins ordered foley to be placed for acute urinary retention and to monitor I/O.

## 2016-06-13 NOTE — Progress Notes (Signed)
Patient newly admitted during the night, blood pressure remains on the lower side, IV fluids infusing. Complains of some general achiness, Tylenol given PO which was effective per patient. Patient has rested quietly with eyes closed throughout the shift. Hailey [name]

## 2016-06-13 NOTE — Progress Notes (Signed)
Called and spoke with Dr.Mody about patient's elevated troponin level increase from 0.18 to 0.27. No new orders given at this time. Hailey [name]

## 2016-06-13 NOTE — Consult Note (Signed)
Cardiology Consultation Note  Patient ID: Hailey [name], MRN: [id number], DOB/AGE: [birth year] 83 y.o. Admit date: 06/12/2016   Date of Consult: 06/13/2016 Primary Physician: Marcello Fennel, MD Primary Cardiologist: Dr. Rockey Situ, MD Requesting Physician: Dr. Anselm Jungling, MD  Chief Complaint: AMS Reason for Consult: Elevated troponin  HPI: Hailey [name] is a 83 y.o. female who is being seen today for the evaluation of elevated troponin at the request of Dr. Anselm Jungling, MD. Patient has a h/o CAD with recent anterior wall ST elevation MI s/p PCI/DES with residual tortuous distal LAD stenosis of 99%, PAF not on full-dose anticoagulation 2/2 GI bleed, pulmonary HTN, prior DVT s/p IVC filter, HTN, HLD, OSA intolerant to CPAP, fibromyalgia, and anxiety who was recently admitted in mid March for anterior ST elevation MI as above, repeat admission in mid April for AMS 2/2 sepsis 2/2 UTI who [facility] on 4/[facility] with AMS.  Recent admission in mid March as above with LHC showing proximal to mid LAD with 95% stenosis s/p PCI/DES with a tortuous distal LAD of 99% medically managed. See details below. On DAPT at home. Post-intervention echo showed EF 55-60%, technically difficult study, unable to exlcude RWMA, severely calcified mitral annulus, LA mildly dilated.   She was found to have GNR x 2 in blood cultures with ID of E coli. On vancomycine and Zosyn. WBC elevated at 25,000, down trending. Troponin 0.18-->0.27. On 4/30 she bumped her troponin to 0.27 and developed uncontrolled "shaking"/ tremor. Telemetry was felt to show ventricular arrhythmia vs artifact. Oxygen saturations dropped to 79. Rapid Response was called. Cardiology asked to evaluate.     Past Medical History:  Diagnosis Date  . Anemia    Chronic  . Anxiety   . Arthritis   . Asthma   . Atrophic vaginitis   . Cerebral aneurysm    Hx of  . CHF (congestive heart failure) ([place])   . COPD (chronic obstructive pulmonary disease) ([place])   .  Diverticulitis   . DVT (deep vein thrombosis) in pregnancy ([place]) 09/2008   a. LLE, recurrent hx, has IVC filter. Not on coumadin now with history of GI bleeding  . Fibrocystic disease of breast   . Fibromyalgia   . GERD (gastroesophageal reflux disease)   . Gout   . Gross hematuria   . Heart murmur   . History of colonoscopy   . Hyperlipidemia   . Hypertension   . Hypertrophic cardiomyopathy ([place])    a. Echo 4/09 with EF 70-75%, asymmetricy basal septal hypertrophy, mild MR without systolic anterior motion of the mitral valve, LVOT gradient to 130 mmHg with Valsalva b. Echo 7/13: EF 60-65%, mild focal basal septal hypertrophy, no significant LVOT gradient, no MV SAM c. Echo 2/15: EF 55-60%, HOCM, resting LVOT gradient 29 mmHg, Valsalva LVOT gradient > 140 mmHg, mild LVH, mild TR, elevated PASP  . Incomplete bladder emptying   . Migraine headache    Hx of  . Obesity   . OSA (obstructive sleep apnea)    a. Intolerant to CPAP, wears 2L via n/c  . Osteoarthritis    Hx of left TKR  . PAF (paroxysmal atrial fibrillation) ([place])    a. Full-dose ASA alone 2/2 h/o GIB.  Marland Kitchen PAT (paroxysmal atrial tachycardia) ([place])   . 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 Pneumonia   . PUD (peptic ulcer disease)    With GI bleeding  . Pulmonary embolism ([place])   . Tremor  Most Recent Cardiac Studies: LHC 05/01/16: Conclusion     Dist LAD lesion, 99 %stenosed.  Ost 1st Diag to 1st Diag lesion, 50 %stenosed.  There is mild left ventricular systolic dysfunction.  LV end diastolic pressure is normal.  The left ventricular ejection fraction is 45-50% by visual estimate. Apical akinesis  There is trivial (1+) mitral regurgitation.  A STENT XIENCE ALPINE RX 2.37S28 drug eluting stent was successfully placed, and does not overlap previously placed stent.  Prox LAD to Mid LAD lesion, 95 %stenosed.  Post intervention, there is a 0% residual stenosis.     Conclusion Diagnostic cardiac catheter as part of his STEMI which identified a proximal 95% LAD lesion and a distal 99 with a very tortuous size LAD Successful PCI and stent of proximal LAD with DES 3.2515 mm xience Mildly depressed overall left ventricular function with apical akinesis ejection fraction of 45%   TTE 05/01/16: Study Conclusions  - Procedure narrative: Transthoracic echocardiography. Image   quality was suboptimal. The study was technically difficult. - Left ventricle: Systolic function was normal. The estimated   ejection fraction was in the range of 55% to 60%. Regional wall   motion abnormalities cannot be excluded. - Mitral valve: Severely calcified annulus. - Left atrium: The atrium was mildly dilated.   Surgical History:  Past Surgical History:  Procedure Laterality Date  . ABDOMINAL HYSTERECTOMY    . APPENDECTOMY    . BLADDER SUSPENSION    . CARDIAC CATHETERIZATION  2009   No significant CAD  . CARDIOVASCULAR STRESS TEST     a. Lexiscan Myoview 3/14: EF 76%, no evidence of ischemia or WMAs  . cataract surgery    . CEREBRAL ANEURYSM REPAIR  1998  . CORONARY STENT INTERVENTION N/A 05/01/2016   Procedure: Coronary Stent Intervention;  Surgeon: Yolonda Kida, MD;  Location: [place] [facility];  Service: Cardiovascular;  Laterality: N/A;  . ESOPHAGOGASTRODUODENOSCOPY (EGD) WITH PROPOFOL N/A 11/24/2015   Procedure: ESOPHAGOGASTRODUODENOSCOPY (EGD) WITH PROPOFOL;  Surgeon: Lollie Sails, MD;  Location: [facility] ENDOSCOPY;  Service: Endoscopy;  Laterality: N/A;  . FOOT SURGERY     Bilateral  . INGUINAL HERNIA REPAIR  1968   Right  . KNEE ARTHROSCOPY  2009   Right  . LEFT HEART CATH AND CORONARY ANGIOGRAPHY N/A 05/01/2016   Procedure: Left Heart Cath and Coronary Angiography;  Surgeon: Yolonda Kida, MD;  Location: [place] [facility];  Service: Cardiovascular;  Laterality: N/A;  . SHOULDER SURGERY  1972   Right   Left 2012  . TONSILLECTOMY AND  ADENOIDECTOMY    . TOTAL KNEE ARTHROPLASTY     Left     Home Meds: Prior to Admission medications   Medication Sig Start Date End Date Taking? Authorizing Provider  acetaminophen (TYLENOL) 325 MG tablet Take 2 tablets (650 mg total) by mouth every 6 (six) hours as needed for mild pain or moderate pain (temp > 101.5). 05/25/15  Yes Nicholes Mango, MD  albuterol (PROVENTIL HFA;VENTOLIN HFA) 108 (90 Base) MCG/ACT inhaler Inhale 1 puff into the lungs every 6 (six) hours as needed for wheezing or shortness of breath.   Yes Historical Provider, MD  albuterol (PROVENTIL) (2.5 MG/3ML) 0.083% nebulizer solution Take 3 mLs (2.5 mg total) by nebulization every 4 (four) hours as needed for wheezing or shortness of breath. 05/25/15  Yes Nicholes Mango, MD  amiodarone (PACERONE) 200 MG tablet Take 1 tablet (200 mg total) by mouth daily. Pt is able to take an additional tablet  if needed for breakthrough arrhythmias. 08/04/15  Yes Minna Merritts, MD  aspirin EC 81 MG EC tablet Take 1 tablet (81 mg total) by mouth daily. 05/06/16  Yes Vipul Manuella Ghazi, MD  atorvastatin (LIPITOR) 80 MG tablet Take 1 tablet (80 mg total) by mouth daily at 6 PM. 05/06/16  Yes Vipul Manuella Ghazi, MD  carbidopa-levodopa (SINEMET IR) 25-250 MG tablet Take 1 tablet by mouth 4 (four) times daily. 05/06/16  Yes Vipul Manuella Ghazi, MD  chlorhexidine (PERIDEX) 0.12 % solution 15 mLs by Mouth Rinse route 2 (two) times daily. 04/14/16  Yes Historical Provider, MD  diazepam (VALIUM) 2 MG tablet Take 1 tablet (2 mg total) by mouth every 6 (six) hours as needed for anxiety (or back pain). 05/06/16  Yes Vipul Manuella Ghazi, MD  dimenhyDRINATE (DRAMAMINE) 50 MG tablet Take 25-50 mg by mouth every 6 (six) hours as needed for dizziness.    Yes Historical Provider, MD  docusate sodium (COLACE) 100 MG capsule Take 100 mg by mouth 2 (two) times daily as needed for mild constipation.   Yes Historical Provider, MD  DULoxetine (CYMBALTA) 30 MG capsule Take 3 capsules (90 mg total) by mouth daily.  05/07/16  Yes Vipul Manuella Ghazi, MD  fluticasone (FLONASE) 50 MCG/ACT nasal spray Place 2 sprays into both nostrils daily as needed for rhinitis.   Yes Historical Provider, MD  gabapentin (NEURONTIN) 100 MG capsule Take 200-400 mg by mouth 3 (three) times daily. Pt takes three capsules in the morning, two capsules in the evening, and four capsules at bedtime.   Yes Historical Provider, MD  isosorbide mononitrate (IMDUR) 30 MG 24 hr tablet Take 1 tablet (30 mg total) by mouth daily. 05/06/16  Yes Vipul Manuella Ghazi, MD  levothyroxine (SYNTHROID, LEVOTHROID) 50 MCG tablet Take 50 mcg by mouth daily before breakfast.    Yes Historical Provider, MD  losartan (COZAAR) 25 MG tablet Take 25 mg by mouth daily. 11/27/15  Yes Historical Provider, MD  mometasone-formoterol (DULERA) 100-5 MCG/ACT AERO Inhale 2 puffs into the lungs 2 (two) times daily. 05/25/15  Yes Nicholes Mango, MD  montelukast (SINGULAIR) 10 MG tablet Take 10 mg by mouth at bedtime.   Yes Historical Provider, MD  nystatin (MYCOSTATIN) 100000 UNIT/ML suspension Take 5 mLs by mouth 2 (two) times daily. 06/10/16 06/17/16 Yes Historical Provider, MD  ondansetron (ZOFRAN-ODT) 4 MG disintegrating tablet Take 4 mg by mouth every 8 (eight) hours as needed for nausea or vomiting.   Yes Historical Provider, MD  oxyCODONE (OXY IR/ROXICODONE) 5 MG immediate release tablet Take 1 tablet (5 mg total) by mouth every 6 (six) hours as needed for moderate pain or severe pain. 05/06/16  Yes Vipul Manuella Ghazi, MD  pantoprazole (PROTONIX) 40 MG tablet Take 40 mg by mouth daily.   Yes Historical Provider, MD  polyethylene glycol (MIRALAX / GLYCOLAX) packet Take 17 g by mouth daily as needed for moderate constipation.    Yes Historical Provider, MD  potassium chloride (K-DUR,KLOR-CON) 10 MEQ tablet Take 1 tablet (10 mEq total) by mouth 3 (three) times daily. 05/27/16  Yes Minna Merritts, MD  senna (SENOKOT) 8.6 MG TABS tablet Take 2 tablets (17.2 mg total) by mouth 2 (two) times daily. 05/25/15   Yes Nicholes Mango, MD  sucralfate (CARAFATE) 1 g tablet Take 1 g by mouth 2 (two) times daily. 03/21/16  Yes Historical Provider, MD  ticagrelor (BRILINTA) 90 MG TABS tablet Take 1 tablet (90 mg total) by mouth 2 (two) times daily. 05/06/16  Yes Max Sane, MD  tiotropium (SPIRIVA) 18 MCG inhalation capsule Place 18 mcg into inhaler and inhale daily.   Yes Historical Provider, MD  torsemide (DEMADEX) 20 MG tablet Take 1 tablet (20 mg total) by mouth 2 (two) times daily. 10/24/15  Yes Gladstone Lighter, MD  traZODone (DESYREL) 50 MG tablet Take 25 mg by mouth at bedtime.   Yes Historical Provider, MD  triamcinolone ointment (KENALOG) 0.1 % Apply 1 application topically 2 (two) times daily as needed (for rash).    Yes Historical Provider, MD  feeding supplement, ENSURE ENLIVE, (ENSURE ENLIVE) LIQD Take 237 mLs by mouth 2 (two) times daily between meals. 01/28/15   Bettey Costa, MD    Inpatient Medications:  . amiodarone  200 mg Oral Daily  . aspirin EC  81 mg Oral Daily  . atorvastatin  80 mg Oral q1800  . carbidopa-levodopa  1 tablet Oral QID  . enoxaparin (LOVENOX) injection  40 mg Subcutaneous Q24H  . levothyroxine  50 mcg Oral QAC breakfast  . LORazepam      . LORazepam  1 mg Intravenous Once  . mouth rinse  15 mL Mouth Rinse BID  . mometasone-formoterol  2 puff Inhalation BID  . pantoprazole  40 mg Oral Daily  . sodium chloride flush  3 mL Intravenous Q12H  . ticagrelor  90 mg Oral BID  . tiotropium  18 mcg Inhalation Daily   . sodium chloride 100 mL/hr at 06/13/16 0402  . magnesium sulfate 1 - 4 g bolus IVPB    . piperacillin-tazobactam (ZOSYN)  IV 3.375 g (06/13/16 0415)  . vancomycin Stopped (06/13/16 0525)    Allergies:  Allergies  Allergen Reactions  . Aspirin Other (See Comments)    Reaction:  GI bleeding   . Codeine Nausea And Vomiting  . Compazine [Prochlorperazine] Other (See Comments)    Confusion  . Ketorolac Tromethamine Other (See Comments)    Reaction:  Headache   .  Nsaids Other (See Comments)    Reaction:  GI bleeding   . Phenazopyridine Hcl Other (See Comments)    Reaction:  Vision problems   . Macrodantin [Nitrofurantoin Macrocrystal] Rash    Social History   Social History  . Marital status: Widowed    Spouse name: N/A  . Number of children: N/A  . Years of education: N/A   Occupational History  . Retired Retired   Social History Main Topics  . Smoking status: Former Smoker    Packs/day: 1.00    Years: 60.00    Types: Cigarettes    Quit date: 12/15/2008  . Smokeless tobacco: Never Used     Comment: smoked for 50 years quit 5 + years  . Alcohol use 0.0 oz/week     Comment: Socially  . Drug use: No  . Sexual activity: Not Currently   Other Topics Concern  . Not on file   Social History Narrative   Lives in [place] with daughter   Does not get regular exercise   Daily caffeine use, 1 cup of coffee     Family History  Problem Relation Age of Onset  . Pancreatic cancer Mother   . Hypertension Mother     father  . Diabetes Mellitus II Mother     Sister  . Colon cancer Father   . Colon polyps Father   . Heart disease Father   . Stroke Father   . Heart disease Sister     More than 1 sister  . Colon cancer Brother   .  Colon polyps Other     Siblings  . Ulcers Brother      Review of Systems: Review of Systems  Unable to perform ROS: Medical condition    Labs:  Recent Labs  06/12/16 1845 06/13/16 0312  TROPONINI 0.18* 0.27*   Lab Results  Component Value Date   WBC 15.7 (H) 06/13/2016   HGB 8.6 (L) 06/13/2016   HCT 25.7 (L) 06/13/2016   MCV 91.5 06/13/2016   PLT 333 06/13/2016    Recent Labs Lab 06/12/16 1845 06/13/16 0312  NA 134* 136  K 3.8 3.9  CL 99* 103  CO2 24 25  BUN 11 11  CREATININE 1.10* 1.08*  CALCIUM 8.8* 8.5*  PROT 6.1*  --   BILITOT 0.9  --   ALKPHOS 99  --   ALT 7*  --   AST 27  --   GLUCOSE 108* 106*   Lab Results  Component Value Date   CHOL 184 05/02/2016   HDL 47  05/02/2016   LDLCALC 80 05/02/2016   TRIG 286 (H) 05/02/2016   Lab Results  Component Value Date   DDIMER (H) 09/02/2008    2.79        AT THE INHOUSE ESTABLISHED CUTOFF VALUE OF 0.48 ug/mL FEU, THIS ASSAY HAS BEEN DOCUMENTED IN THE LITERATURE TO HAVE A SENSITIVITY AND NEGATIVE PREDICTIVE VALUE OF AT LEAST 98 TO 99%.  THE TEST RESULT SHOULD BE CORRELATED WITH AN ASSESSMENT OF THE CLINICAL PROBABILITY OF DVT / VTE.    Radiology/Studies:  Dg Chest Port 1 View  Result Date: 06/12/2016 CLINICAL DATA:  Hypoxia EXAM: PORTABLE CHEST 1 VIEW COMPARISON:  06/01/16 FINDINGS: Cardiac shadow is within normal limits. The lungs are well aerated bilaterally. No focal infiltrate or sizable effusion is seen. No bony abnormality is noted. IMPRESSION: No acute abnormality noted. Electronically Signed   By: Inez Catalina M.D.   On: 06/12/2016 19:16   Dg Chest Port 1 View  Result Date: 06/01/2016 CLINICAL DATA:  83 y/o  F; shortness of breath. EXAM: PORTABLE CHEST 1 VIEW COMPARISON:  05/31/2016 chest radiograph. FINDINGS: Stable cardiac silhouette. Aortic atherosclerosis with calcification. Low lung volumes with minor bibasilar atelectasis. No focal consolidation. No pleural effusion or pneumothorax. Bones are unremarkable. IMPRESSION: Low lung volumes.  No acute pulmonary process identified. Electronically Signed   By: Kristine Garbe M.D.   On: 06/01/2016 17:01   Dg Chest Port 1 View  Result Date: 05/31/2016 CLINICAL DATA:  Altered mental status EXAM: PORTABLE CHEST 1 VIEW COMPARISON:  05/07/2016 FINDINGS: Minimal basilar atelectasis. No consolidation or effusion. Stable cardiomediastinal silhouette. No pneumothorax. Metallic opacity over the mandible. IMPRESSION: No active disease. Electronically Signed   By: Donavan Foil M.D.   On: 05/31/2016 22:24    EKG: Interpreted by me showed: NSR, 75 bpm, baseline wandering, early re-polarization, anterolateral TWI Telemetry: Interpreted by me showed:  sinus rhythm with underlying artifact from tremor  Weights: Filed Weights   06/12/16 1836  Weight: 160 lb (72.6 kg)     Physical Exam: Blood pressure (!) 127/103, pulse 95, temperature 97.5 F (36.4 C), temperature source Oral, resp. rate 17, height 5\' 1"  (1.549 m), weight 160 lb (72.6 kg), SpO2 98 %. Body mass index is 30.23 kg/m. General: Ill and frail appearing, in no acute distress. Mild tremor.  Head: Normocephalic, atraumatic, sclera non-icteric, no xanthomas, nares are without discharge.  Neck: Negative for carotid bruits. JVD not elevated. Lungs: Diminished breath sounds bilaterally. Breathing is unlabored. Heart: RRR with  S1 S2. No murmurs, rubs, or gallops appreciated. Abdomen: Soft, non-tender, non-distended with normoactive bowel sounds. No hepatomegaly. No rebound/guarding. No obvious abdominal masses. Msk:  Strength and tone appear normal for age. Extremities: No clubbing or cyanosis. No edema. Distal pedal pulses are 2+ and equal bilaterally. Neuro: Somnolent. No facial asymmetry. No focal deficit. Moves all extremities spontaneously. Psych:  Somnolent.    Assessment and Plan:  Active Problems:   Altered mental status   Sepsis ([place])    1. Elevated troponin/CAD: -Recent anterior ST elevation MI as above -Has been altered this admission, though has not noted chest pain or SOB -Troponin trending up to 0.27 at 3 AM, continue to cycle until peaks -Possibly demand ischemia in the setting of her GNR bacteremia with sepsis -Check stat echo this morning -Hold heparin gtt at this time until daughter arrives to discuss further and echo as above -Apparently, daughter would not want further invasive testing -DAPT -Not on beta blocker 2/2 hypotension  2. Possible arrhythmia: -Appears to be most c/w artifact from tremor/shaking -Stat magnesium pending -IV magnesium per IM -Check stat echo  3. Sepsis with bacteremia: -Blood cultures x 2 positive for GNR -On  vancomycin and Zosyn per IM  4. AMS/tremor: -Likely 2/2 the above -Stat CT head pending per IM  5. Remaining per IM   Signed, Christell Faith, PA-C [place] Pager: [phone] 06/13/2016, 8:20 AM

## 2016-06-13 NOTE — Progress Notes (Signed)
*  PRELIMINARY RESULTS* Echocardiogram 2D Echocardiogram has been performed.  Hailey [name] 06/13/2016, 9:39 AM

## 2016-06-13 NOTE — Progress Notes (Signed)
PHARMACY - PHYSICIAN COMMUNICATION CRITICAL VALUE ALERT - BLOOD CULTURE IDENTIFICATION (BCID)  Results for orders placed or performed during the hospital encounter of 06/12/16  Blood Culture ID Panel (Reflexed) (Collected: 06/12/2016  6:45 PM)  Result Value Ref Range   Enterococcus species NOT DETECTED NOT DETECTED   Listeria monocytogenes NOT DETECTED NOT DETECTED   Staphylococcus species NOT DETECTED NOT DETECTED   Staphylococcus aureus NOT DETECTED NOT DETECTED   Streptococcus species NOT DETECTED NOT DETECTED   Streptococcus agalactiae NOT DETECTED NOT DETECTED   Streptococcus pneumoniae NOT DETECTED NOT DETECTED   Streptococcus pyogenes NOT DETECTED NOT DETECTED   Acinetobacter baumannii NOT DETECTED NOT DETECTED   Enterobacteriaceae species DETECTED (A) NOT DETECTED   Enterobacter cloacae complex NOT DETECTED NOT DETECTED   Escherichia coli DETECTED (A) NOT DETECTED   Klebsiella oxytoca NOT DETECTED NOT DETECTED   Klebsiella pneumoniae NOT DETECTED NOT DETECTED   Proteus species NOT DETECTED NOT DETECTED   Serratia marcescens NOT DETECTED NOT DETECTED   Carbapenem resistance NOT DETECTED NOT DETECTED   Haemophilus influenzae NOT DETECTED NOT DETECTED   Neisseria meningitidis NOT DETECTED NOT DETECTED   Pseudomonas aeruginosa NOT DETECTED NOT DETECTED   Candida albicans NOT DETECTED NOT DETECTED   Candida glabrata NOT DETECTED NOT DETECTED   Candida krusei NOT DETECTED NOT DETECTED   Candida parapsilosis NOT DETECTED NOT DETECTED   Candida tropicalis NOT DETECTED NOT DETECTED    Name of physician (or Provider) Contacted: Vachhani  Changes to prescribed antibiotics required: Will discontinue Vancomycin and Zosyn and start patient on Meropenem 1g IV q12h. Dose is renally adjusted for est. CrCl of 37.2 ml/min.  Paulina Fusi, PharmD, BCPS 06/13/2016 9:14 AM

## 2016-06-13 NOTE — Consult Note (Signed)
Consultation Note Date: 06/13/2016   Patient Name: Hailey [name]  DOB: [birth year]  MRN: [id number]  Age / Sex: 83 y.o., female  PCP: Derinda Late, MD Referring Physician: Vaughan Basta, MD  Reason for Consultation: Establishing goals of care  HPI/Patient Profile: 83 y.o. female  with past medical history of Parkinson's, STEMI in March (S/P stenting of LAD), afib, anxiety, cerebral aneurysm, CHF, COPD, fibromyalgia, gout, hypertrophic cardiomyopathy and UTI E.Coli sepsis (d/c'd 4/20 to SNF) admitted on 06/12/2016 from SNF with increasing somnolence, fever (reportedly max 103), found to be hypotensive and hypoxic in the ED. Workup revealed recurrent sepsis likely from UTI, and elevated troponin (likely demand ischemia). Palliative medicine consulted for [place].  Clinical Assessment and Goals of Care  Spoke with patient's daughter, Hailey [name] via telephone. Hailey [name] was very clear that [place] were to provide non invasive care and treat what is treatable for now. Hailey [name] feels that possibly this is an infection that her mother cannot recover from. She would like to meet with palliative to discuss options for transitioning to comfort care. Plans were made to meet with Hailey [name] and her brother, Hailey [name] at 3pm.  I evaluated patient- she was more awake and alert than this morning, drank several cupfuls of water. Not oriented to place or time. Denied pain or other complaints. Patient's son, Hailey [name], was present- he noted that morphine given this morning appeared to help her with comfort, and that patient was now requesting food and drink. He tells me patient has been declining over the last few months and he worries about her. However, her would not like to proceed with [place] discussion without sister Hailey [name] present.   [place] meeting deferred until tomorrow at [facility] as Hailey [name] was not able to be at 3pm scheduled meeting.  Primary Decision  Maker NEXT OF KIN - Hailey [name] and Hailey [name] (patient's children)    SUMMARY OF RECOMMENDATIONS -[place] meeting at [facility] -Continue morphine 1mg  IV q1hr prn discomfort    Code Status/Advance Care Planning:  DNR  Palliative Prophylaxis:   Delirium Protocol  Prognosis:    Unable to determine  Discharge Planning: To Be Determined  Primary Diagnoses: Present on Admission: . Altered mental status . Sepsis ([place])   I have reviewed the medical record, interviewed the patient and family, and examined the patient. The following aspects are pertinent.  Past Medical History:  Diagnosis Date  . Anemia    Chronic  . Anxiety   . Arthritis   . Asthma   . Atrophic vaginitis   . Cerebral aneurysm    Hx of  . CHF (congestive heart failure) ([place])   . COPD (chronic obstructive pulmonary disease) ([place])   . Diverticulitis   . DVT (deep vein thrombosis) in pregnancy ([place]) 09/2008   a. LLE, recurrent hx, has IVC filter. Not on coumadin now with history of GI bleeding  . Fibrocystic disease of breast   . Fibromyalgia   . GERD (gastroesophageal reflux disease)   . Gout   . Gross hematuria   .  Heart murmur   . History of colonoscopy   . Hyperlipidemia   . Hypertension   . Hypertrophic cardiomyopathy ([place])    a. Echo 4/09 with EF 70-75%, asymmetricy basal septal hypertrophy, mild MR without systolic anterior motion of the mitral valve, LVOT gradient to 130 mmHg with Valsalva b. Echo 7/13: EF 60-65%, mild focal basal septal hypertrophy, no significant LVOT gradient, no MV SAM c. Echo 2/15: EF 55-60%, HOCM, resting LVOT gradient 29 mmHg, Valsalva LVOT gradient > 140 mmHg, mild LVH, mild TR, elevated PASP  . Incomplete bladder emptying   . Migraine headache    Hx of  . Obesity   . OSA (obstructive sleep apnea)    a. Intolerant to CPAP, wears 2L via n/c  . Osteoarthritis    Hx of left TKR  . PAF (paroxysmal atrial fibrillation) ([place])    a. Full-dose ASA alone 2/2 h/o GIB.  Marland Kitchen PAT  (paroxysmal atrial tachycardia) ([place])   . 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 Pneumonia   . PUD (peptic ulcer disease)    With GI bleeding  . Pulmonary embolism ([place])   . Tremor    Social History   Social History  . Marital status: Widowed    Spouse name: N/A  . Number of children: N/A  . Years of education: N/A   Occupational History  . Retired Retired   Social History Main Topics  . Smoking status: Former Smoker    Packs/day: 1.00    Years: 60.00    Types: Cigarettes    Quit date: 12/15/2008  . Smokeless tobacco: Never Used     Comment: smoked for 50 years quit 5 + years  . Alcohol use 0.0 oz/week     Comment: Socially  . Drug use: No  . Sexual activity: Not Currently   Other Topics Concern  . None   Social History Narrative   Lives in [place] with daughter   Does not get regular exercise   Daily caffeine use, 1 cup of coffee   Family History  Problem Relation Age of Onset  . Pancreatic cancer Mother   . Hypertension Mother     father  . Diabetes Mellitus II Mother     Sister  . Colon cancer Father   . Colon polyps Father   . Heart disease Father   . Stroke Father   . Heart disease Sister     More than 1 sister  . Colon cancer Brother   . Colon polyps Other     Siblings  . Ulcers Brother    Scheduled Meds: . amiodarone  200 mg Oral Daily  . aspirin EC  81 mg Oral Daily  . atorvastatin  80 mg Oral q1800  . carbidopa-levodopa  1 tablet Oral QID  . enoxaparin (LOVENOX) injection  40 mg Subcutaneous Q24H  . levothyroxine  50 mcg Oral QAC breakfast  . mouth rinse  15 mL Mouth Rinse BID  . mometasone-formoterol  2 puff Inhalation BID  . pantoprazole  40 mg Oral Daily  . sodium chloride flush  3 mL Intravenous Q12H  . ticagrelor  90 mg Oral BID  . tiotropium  18 mcg Inhalation Daily   Continuous Infusions: . sodium chloride 100 mL/hr at 06/13/16 0402  . meropenem Stopped (06/13/16 1123)   PRN  Meds:.acetaminophen **OR** acetaminophen, albuterol, albuterol, fluticasone, LORazepam, morphine injection, ondansetron **OR** ondansetron (ZOFRAN) IV Medications Prior to Admission:  Prior to Admission medications   Medication  Sig Start Date End Date Taking? Authorizing Provider  acetaminophen (TYLENOL) 325 MG tablet Take 2 tablets (650 mg total) by mouth every 6 (six) hours as needed for mild pain or moderate pain (temp > 101.5). 05/25/15  Yes Nicholes Mango, MD  albuterol (PROVENTIL HFA;VENTOLIN HFA) 108 (90 Base) MCG/ACT inhaler Inhale 1 puff into the lungs every 6 (six) hours as needed for wheezing or shortness of breath.   Yes Historical Provider, MD  albuterol (PROVENTIL) (2.5 MG/3ML) 0.083% nebulizer solution Take 3 mLs (2.5 mg total) by nebulization every 4 (four) hours as needed for wheezing or shortness of breath. 05/25/15  Yes Nicholes Mango, MD  amiodarone (PACERONE) 200 MG tablet Take 1 tablet (200 mg total) by mouth daily. Pt is able to take an additional tablet if needed for breakthrough arrhythmias. 08/04/15  Yes Minna Merritts, MD  aspirin EC 81 MG EC tablet Take 1 tablet (81 mg total) by mouth daily. 05/06/16  Yes Vipul Manuella Ghazi, MD  atorvastatin (LIPITOR) 80 MG tablet Take 1 tablet (80 mg total) by mouth daily at 6 PM. 05/06/16  Yes Vipul Manuella Ghazi, MD  carbidopa-levodopa (SINEMET IR) 25-250 MG tablet Take 1 tablet by mouth 4 (four) times daily. 05/06/16  Yes Vipul Manuella Ghazi, MD  chlorhexidine (PERIDEX) 0.12 % solution 15 mLs by Mouth Rinse route 2 (two) times daily. 04/14/16  Yes Historical Provider, MD  diazepam (VALIUM) 2 MG tablet Take 1 tablet (2 mg total) by mouth every 6 (six) hours as needed for anxiety (or back pain). 05/06/16  Yes Vipul Manuella Ghazi, MD  dimenhyDRINATE (DRAMAMINE) 50 MG tablet Take 25-50 mg by mouth every 6 (six) hours as needed for dizziness.    Yes Historical Provider, MD  docusate sodium (COLACE) 100 MG capsule Take 100 mg by mouth 2 (two) times daily as needed for mild constipation.    Yes Historical Provider, MD  DULoxetine (CYMBALTA) 30 MG capsule Take 3 capsules (90 mg total) by mouth daily. 05/07/16  Yes Vipul Manuella Ghazi, MD  fluticasone (FLONASE) 50 MCG/ACT nasal spray Place 2 sprays into both nostrils daily as needed for rhinitis.   Yes Historical Provider, MD  gabapentin (NEURONTIN) 100 MG capsule Take 200-400 mg by mouth 3 (three) times daily. Pt takes three capsules in the morning, two capsules in the evening, and four capsules at bedtime.   Yes Historical Provider, MD  isosorbide mononitrate (IMDUR) 30 MG 24 hr tablet Take 1 tablet (30 mg total) by mouth daily. 05/06/16  Yes Vipul Manuella Ghazi, MD  levothyroxine (SYNTHROID, LEVOTHROID) 50 MCG tablet Take 50 mcg by mouth daily before breakfast.    Yes Historical Provider, MD  losartan (COZAAR) 25 MG tablet Take 25 mg by mouth daily. 11/27/15  Yes Historical Provider, MD  mometasone-formoterol (DULERA) 100-5 MCG/ACT AERO Inhale 2 puffs into the lungs 2 (two) times daily. 05/25/15  Yes Nicholes Mango, MD  montelukast (SINGULAIR) 10 MG tablet Take 10 mg by mouth at bedtime.   Yes Historical Provider, MD  nystatin (MYCOSTATIN) 100000 UNIT/ML suspension Take 5 mLs by mouth 2 (two) times daily. 06/10/16 06/17/16 Yes Historical Provider, MD  ondansetron (ZOFRAN-ODT) 4 MG disintegrating tablet Take 4 mg by mouth every 8 (eight) hours as needed for nausea or vomiting.   Yes Historical Provider, MD  oxyCODONE (OXY IR/ROXICODONE) 5 MG immediate release tablet Take 1 tablet (5 mg total) by mouth every 6 (six) hours as needed for moderate pain or severe pain. 05/06/16  Yes Vipul Manuella Ghazi, MD  pantoprazole (PROTONIX) 40 MG tablet Take  40 mg by mouth daily.   Yes Historical Provider, MD  polyethylene glycol (MIRALAX / GLYCOLAX) packet Take 17 g by mouth daily as needed for moderate constipation.    Yes Historical Provider, MD  potassium chloride (K-DUR,KLOR-CON) 10 MEQ tablet Take 1 tablet (10 mEq total) by mouth 3 (three) times daily. 05/27/16  Yes Minna Merritts,  MD  senna (SENOKOT) 8.6 MG TABS tablet Take 2 tablets (17.2 mg total) by mouth 2 (two) times daily. 05/25/15  Yes Nicholes Mango, MD  sucralfate (CARAFATE) 1 g tablet Take 1 g by mouth 2 (two) times daily. 03/21/16  Yes Historical Provider, MD  ticagrelor (BRILINTA) 90 MG TABS tablet Take 1 tablet (90 mg total) by mouth 2 (two) times daily. 05/06/16  Yes Vipul Manuella Ghazi, MD  tiotropium (SPIRIVA) 18 MCG inhalation capsule Place 18 mcg into inhaler and inhale daily.   Yes Historical Provider, MD  torsemide (DEMADEX) 20 MG tablet Take 1 tablet (20 mg total) by mouth 2 (two) times daily. 10/24/15  Yes Gladstone Lighter, MD  traZODone (DESYREL) 50 MG tablet Take 25 mg by mouth at bedtime.   Yes Historical Provider, MD  triamcinolone ointment (KENALOG) 0.1 % Apply 1 application topically 2 (two) times daily as needed (for rash).    Yes Historical Provider, MD  feeding supplement, ENSURE ENLIVE, (ENSURE ENLIVE) LIQD Take 237 mLs by mouth 2 (two) times daily between meals. 01/28/15   Bettey Costa, MD   Allergies  Allergen Reactions  . Aspirin Other (See Comments)    Reaction:  GI bleeding   . Codeine Nausea And Vomiting  . Compazine [Prochlorperazine] Other (See Comments)    Confusion  . Ketorolac Tromethamine Other (See Comments)    Reaction:  Headache   . Nsaids Other (See Comments)    Reaction:  GI bleeding   . Phenazopyridine Hcl Other (See Comments)    Reaction:  Vision problems   . Macrodantin [Nitrofurantoin Macrocrystal] Rash   Review of Systems  Physical Exam  Vital Signs: BP (!) 92/33 (BP Location: Left Arm)   Pulse 64   Temp 98.6 F (37 C) (Oral)   Resp 18   Ht 5\' 1"  (1.549 m)   Wt 72.6 kg (160 lb)   SpO2 98%   BMI 30.23 kg/m  Pain Assessment: PAINAD   Pain Score: Asleep   SpO2: SpO2: 98 % O2 Device:SpO2: 98 % O2 Flow Rate: .O2 Flow Rate (L/min): 4 L/min  IO: Intake/output summary:  Intake/Output Summary (Last 24 hours) at 06/13/16 1603 Last data filed at 06/13/16 4970  Gross  per 24 hour  Intake             2990 ml  Output                0 ml  Net             2990 ml    LBM:   Baseline Weight: Weight: 72.6 kg (160 lb) Most recent weight: Weight: 72.6 kg (160 lb)     Palliative Assessment/Data:   Flowsheet Rows     Most Recent Value  Intake Tab  Referral Department  Hospitalist  Unit at Time of Referral  Oncology Unit  Palliative Care Primary Diagnosis  Sepsis/Infectious Disease  Date Notified  06/13/16  Palliative Care Type  New Palliative care  Reason for referral  Clarify Goals of Care  Date of Admission  06/12/16  Date first seen by Palliative Care  06/13/16  # of days Palliative  referral response time  0 Day(s)  # of days IP prior to Palliative referral  1  Clinical Assessment  Psychosocial & Spiritual Assessment  Palliative Care Outcomes      Thank you for this consult. Palliative medicine will continue to follow and assist as needed.   Time In: 1100, 1500  Time Out: 1120, 1545 Time Total: 65 minutes Greater than 50%  of this time was spent counseling and coordinating care related to the above assessment and plan.  Signed by: Mariana Kaufman, AGNP-C Palliative Medicine    Please contact Palliative Medicine Team phone at [phone] for questions and concerns.  For individual provider: See Shea Evans

## 2016-06-14 ENCOUNTER — Telehealth: Payer: Self-pay | Admitting: Urology

## 2016-06-14 DIAGNOSIS — A4151 Sepsis due to Escherichia coli [E. coli]: Principal | ICD-10-CM

## 2016-06-14 DIAGNOSIS — D649 Anemia, unspecified: Secondary | ICD-10-CM

## 2016-06-14 DIAGNOSIS — R4182 Altered mental status, unspecified: Secondary | ICD-10-CM

## 2016-06-14 LAB — BASIC METABOLIC PANEL
ANION GAP: 6 (ref 5–15)
BUN: 10 mg/dL (ref 6–20)
CO2: 23 mmol/L (ref 22–32)
Calcium: 8.1 mg/dL — ABNORMAL LOW (ref 8.9–10.3)
Chloride: 106 mmol/L (ref 101–111)
Creatinine, Ser: 0.82 mg/dL (ref 0.44–1.00)
GFR calc Af Amer: >60 mL/min (ref >60)
GLUCOSE: 125 mg/dL — AB (ref 65–99)
POTASSIUM: 3.3 mmol/L — AB (ref 3.5–5.1)
Sodium: 135 mmol/L (ref 135–145)

## 2016-06-14 LAB — CBC
HEMATOCRIT: 22 % — AB (ref 35.0–47.0)
Hemoglobin: 7.5 g/dL — ABNORMAL LOW (ref 12.0–16.0)
MCH: 30.8 pg (ref 26.0–34.0)
MCHC: 33.9 g/dL (ref 32.0–36.0)
MCV: 90.8 fL (ref 80.0–100.0)
PLATELETS: 264 10*3/uL (ref 150–440)
RBC: 2.42 MIL/uL — AB (ref 3.80–5.20)
RDW: 14.5 % (ref 11.5–14.5)
WBC: 13.2 10*3/uL — AB (ref 3.6–11.0)

## 2016-06-14 LAB — GLUCOSE, CAPILLARY: Glucose-Capillary: 106 mg/dL — ABNORMAL HIGH (ref 65–99)

## 2016-06-14 MED ORDER — CLOPIDOGREL BISULFATE 75 MG PO TABS
75.0000 mg | ORAL_TABLET | Freq: Every day | ORAL | Status: DC
  Administered 2016-06-16 – 2016-06-19 (×4): 75 mg via ORAL
  Filled 2016-06-14 (×5): qty 1

## 2016-06-14 MED ORDER — POTASSIUM CHLORIDE CRYS ER 20 MEQ PO TBCR
20.0000 meq | EXTENDED_RELEASE_TABLET | Freq: Two times a day (BID) | ORAL | Status: DC
  Administered 2016-06-14 – 2016-06-17 (×8): 20 meq via ORAL
  Filled 2016-06-14 (×9): qty 1

## 2016-06-14 MED ORDER — ENSURE ENLIVE PO LIQD
237.0000 mL | Freq: Two times a day (BID) | ORAL | Status: DC
  Administered 2016-06-15 – 2016-06-19 (×4): 237 mL via ORAL

## 2016-06-14 MED ORDER — CLOPIDOGREL BISULFATE 75 MG PO TABS
300.0000 mg | ORAL_TABLET | Freq: Once | ORAL | Status: AC
  Administered 2016-06-15: 12:00:00 300 mg via ORAL
  Filled 2016-06-14 (×2): qty 4

## 2016-06-14 MED ORDER — DOCUSATE SODIUM 100 MG PO CAPS
100.0000 mg | ORAL_CAPSULE | Freq: Two times a day (BID) | ORAL | Status: DC
Start: 2016-06-14 — End: 2016-06-20
  Administered 2016-06-14 – 2016-06-19 (×10): 100 mg via ORAL
  Filled 2016-06-14 (×12): qty 1

## 2016-06-14 NOTE — Progress Notes (Signed)
Initial Nutrition Assessment  DOCUMENTATION CODES:   Severe malnutrition in context of chronic illness, Obesity unspecified  INTERVENTION:  Recommend liberalizing diet from Heart Healthy to Regular.  Provide Ensure Enlive po BID, each supplement provides 350 kcal and 20 grams of protein. Patient prefers vanilla.  Encouraged small, frequent meals of foods well-tolerated by patient. Patient not feeling well enough for education at this time.  Patient would benefit from 1:1 assistance at meals due to weakness.  NUTRITION DIAGNOSIS:   Malnutrition (Severe) related to chronic illness (chronic N/V for 6 months) as evidenced by 13.5 percent weight loss over 6 months, moderate depletion of body fat, severe depletion of body fat, moderate depletions of muscle mass, severe depletion of muscle mass.  GOAL:   Patient will meet greater than or equal to 90% of their needs  MONITOR:   PO intake, Supplement acceptance, Labs, Weight trends, I & O's  REASON FOR ASSESSMENT:   Malnutrition Screening Tool    ASSESSMENT:   83 year old female with PMHx of DVT, HLD, GERD, hypertrophic cardiomyopathy, COPD, PUD, PAF, OSA, anxiety, CHF, presented with low blood pressure and confusion found to have sepsis due to UTI, ventricular tachycardia.   -PMT following regarding goals of care. Per chart there was a family meeting this morning to discuss possibility of comfort care (still pending note at time of RD assessment).  Spoke with patient at bedside. She was sitting in chair and was tired, but able to provide some information. No family present at time of assessment. She reports her appetite has been very poor for a long time. Endorses N/V for at least 6 months. She reports she really only eats one good meal per day at this time. She likes to eat 2 over easy fried eggs. She sips on Ensure throughout the day and takes her medicine with it.  Patient unable to report UBW but reports she lost 40 lbs over 6  months. Per chart patient was 185 lbs on 10/19/2015 and lost 25 lbs (13.5% body weight) over approximately 6 months, which is significant for time frame. Patient has been at current weight since 04/2016.  Medications reviewed and include: Colace, levothyroxine, pantoprazole, Ativan PRN.  Labs reviewed: CBG 106, Potassium 3.3, elevated Troponin.  Nutrition-Focused physical exam completed. Findings are moderate-severe fat depletion, moderate-severe muscle depletion, and mild edema.   Discussed with RN. Patient improved today compared to yesterday.  Diet Order:  Diet Heart Room service appropriate? Yes; Fluid consistency: Thin  Skin:  Reviewed, no issues  Last BM:  PTA (06/11/2016 per chart)  Height:   Ht Readings from Last 1 Encounters:  06/12/16 5\' 1"  (1.549 m)    Weight:   Wt Readings from Last 1 Encounters:  06/12/16 160 lb (72.6 kg)    Ideal Body Weight:  47.7 kg  BMI:  Body mass index is 30.23 kg/m.  Estimated Nutritional Needs:   Kcal:  1360-1590 (MSJ x 1.2-1.4)  Protein:  75-85 grams (1-1.2 grams/kg)  Fluid:  1.4-1.6 L/day  EDUCATION NEEDS:   No education needs identified at this time  Willey Blade, MS, RD, LDN Pager: [phone] After Hours Pager: [phone]

## 2016-06-14 NOTE — Progress Notes (Signed)
Patient Name: Hailey [name] Date of Encounter: 06/14/2016  Primary Cardiologist: [facility] Problem List     Active Problems:   Altered mental status   Sepsis [facility])   Palliative care by specialist   Goals of care, counseling/discussion   Advance care planning     Subjective   Feels weak this morning. No chest pain or SOB. TTE on 4/30 showed preserved LVEF at 60-65%, no RWMA, GR2DD, moderate LVOT gradient, mild mitral stenosis, LA moderately dilated, RV cavity size mildly dilated with normal wall thickness. Seen by Palliative Care, patient would not want extraordinary measure including tests or procedures. CT abdomen/pelvis 4/30 without acute findings. CBC with continued anemia with HGB 8.6-->7.5. WBC improving. Possible some component of dilution. Potassium low at 3.3 today. Renal function normal. Documented UOP 1.4 L for the past 24 hours, remains net +3.5 L for the admission.   Inpatient Medications    Scheduled Meds: . amiodarone  200 mg Oral Daily  . aspirin EC  81 mg Oral Daily  . atorvastatin  80 mg Oral q1800  . carbidopa-levodopa  1 tablet Oral QID  . docusate sodium  100 mg Oral BID  . enoxaparin (LOVENOX) injection  40 mg Subcutaneous Q24H  . levothyroxine  50 mcg Oral QAC breakfast  . mouth rinse  15 mL Mouth Rinse BID  . mometasone-formoterol  2 puff Inhalation BID  . pantoprazole  40 mg Oral Daily  . potassium chloride  20 mEq Oral BID  . sodium chloride flush  3 mL Intravenous Q12H  . ticagrelor  90 mg Oral BID  . tiotropium  18 mcg Inhalation Daily   Continuous Infusions: . meropenem 1 g (06/14/16 0830)   PRN Meds: acetaminophen **OR** acetaminophen, albuterol, albuterol, fluticasone, LORazepam, morphine injection, ondansetron **OR** ondansetron (ZOFRAN) IV   Vital Signs    Vitals:   06/13/16 1343 06/13/16 2053 06/14/16 0427 06/14/16 0844  BP: (!) 92/33 (!) 114/40 (!) 115/39 (!) 109/35  Pulse: 64 (!) 126 61 (!) 53  Resp: 18 (!) 22 20   Temp:  98.6 F (37 C) 98.2 F (36.8 C) 99 F (37.2 C) 97.9 F (36.6 C)  TempSrc: Oral Oral Oral Oral  SpO2: 98% (!) 88% 100%   Weight:      Height:        Intake/Output Summary (Last 24 hours) at 06/14/16 0928 Last data filed at 06/14/16 8119  Gross per 24 hour  Intake             1962 ml  Output             1400 ml  Net              562 ml   Filed Weights   06/12/16 1836  Weight: 160 lb (72.6 kg)    Physical Exam    GEN: Pale, ill and frail appearing, in no acute distress.  HEENT: Grossly normal.  Neck: Supple, no JVD, carotid bruits, or masses. Cardiac: RRR, II/VI systolic murmur, no rubs, or gallops. No clubbing, cyanosis, edema.  Radials/DP/PT 2+ and equal bilaterally.  Respiratory:  Diminished breath sounds bilaterally. GI: Soft, nontender, nondistended, BS + x 4. MS: no deformity or atrophy. Skin: warm and dry, no rash. Neuro:  Strength and sensation are intact. Psych: AAOx3.  Normal affect.  Labs    CBC  Recent Labs  06/12/16 1845 06/13/16 0312 06/14/16 0439  WBC 25.5* 15.7* 13.2*  NEUTROABS 23.0*  --   --  HGB 9.6* 8.6* 7.5*  HCT 28.0* 25.7* 22.0*  MCV 90.5 91.5 90.8  PLT 405 333 284   Basic Metabolic Panel  Recent Labs  06/13/16 0312 06/13/16 0836 06/14/16 0439  NA 136  --  135  K 3.9  --  3.3*  CL 103  --  106  CO2 25  --  23  GLUCOSE 106*  --  125*  BUN 11  --  10  CREATININE 1.08*  --  0.82  CALCIUM 8.5*  --  8.1*  MG  --  1.8  --    Liver Function Tests  Recent Labs  06/12/16 1845  AST 27  ALT 7*  ALKPHOS 99  BILITOT 0.9  PROT 6.1*  ALBUMIN 3.1*   No results for input(s): LIPASE, AMYLASE in the last 72 hours. Cardiac Enzymes  Recent Labs  06/13/16 0312 06/13/16 0836 06/13/16 1420  TROPONINI 0.27* 0.27* 0.30*   BNP Invalid input(s): POCBNP D-Dimer No results for input(s): DDIMER in the last 72 hours. Hemoglobin A1C No results for input(s): HGBA1C in the last 72 hours. Fasting Lipid Panel No results for input(s):  CHOL, HDL, LDLCALC, TRIG, CHOLHDL, LDLDIRECT in the last 72 hours. Thyroid Function Tests  Recent Labs  06/13/16 0836  TSH 1.337    Telemetry    Sinus rhythm 80s bpm - Personally Reviewed  ECG    n/a - Personally Reviewed  Radiology    Dg Chest Port 1 View  Result Date: 06/12/2016 IMPRESSION: No acute abnormality noted. Electronically Signed   By: Inez Catalina M.D.   On: 06/12/2016 19:16   Ct Renal Stone Study  Result Date: 06/13/2016 IMPRESSION: 1. No acute findings are noted in the abdomen or pelvis. Specifically, no urinary tract calculi no findings of urinary tract obstruction. 2. Biliary sludge in the gallbladder, without definite evidence to suggest an acute cholecystitis at this time. 3. Aortic atherosclerosis, in addition to at least right coronary artery disease. 4. Colonic diverticulosis without definite evidence to suggest an acute diverticulitis at this time. 5. Additional incidental findings, as above. Electronically Signed   By: Vinnie Langton M.D.   On: 06/13/2016 11:24    Cardiac Studies  TTE 06/13/2016: Study Conclusions  - Procedure narrative: Transthoracic echocardiography. The study   was technically difficult. - Left ventricle: The cavity size was normal. There was mild   concentric hypertrophy. Systolic function was normal. The   estimated ejection fraction was in the range of 60% to 65%. Wall   motion was normal; there were no regional wall motion   abnormalities. Features are consistent with a pseudonormal left   ventricular filling pattern, with concomitant abnormal relaxation   and increased filling pressure (grade 2 diastolic dysfunction).   Moderate LVOT gradient (50-60 mm Hg peak) - Mitral valve: Severely calcified annulus. The findings are   consistent with mild stenosis. - Left atrium: The atrium was moderately dilated. - Right ventricle: The cavity size was mildly dilated. Wall   thickness was normal.  LHC 05/01/16: Conclusion      Dist LAD lesion, 99 %stenosed.  Ost 1st Diag to 1st Diag lesion, 50 %stenosed.  There is mild left ventricular systolic dysfunction.  LV end diastolic pressure is normal.  The left ventricular ejection fraction is 45-50% by visual estimate. Apical akinesis  There is trivial (1+) mitral regurgitation.  A STENT XIENCE ALPINE RX 1.32G40 drug eluting stent was successfully placed, and does not overlap previously placed stent.  Prox LAD to Mid LAD lesion,  95 %stenosed.  Post intervention, there is a 0% residual stenosis.  Conclusion Diagnostic cardiac catheter as part of his STEMI which identified a proximal 95% LAD lesion and a distal 99 with a very tortuous size LAD Successful PCI and stent of proximal LAD with DES 3.2515 mm xience Mildly depressed overall left ventricular function with apical akinesis ejection fraction of 45%   TTE 05/01/16: Study Conclusions  - Procedure narrative: Transthoracic echocardiography. Image quality was suboptimal. The study was technically difficult. - Left ventricle: Systolic function was normal. The estimated ejection fraction was in the range of 55% to 60%. Regional wall motion abnormalities cannot be excluded. - Mitral valve: Severely calcified annulus. - Left atrium: The atrium was mildly dilated.   Patient Profile     83 y.o. female with history of CAD with recent anterior wall ST elevation MI s/p PCI/DES with residual tortuous distal LAD stenosis of 99%, PAF not on full-dose anticoagulation 2/2 GI bleed, pulmonary HTN, prior DVT s/p IVC filter, HTN, HLD, OSA intolerant to CPAP, fibromyalgia, and anxiety who was recently admitted in mid March for anterior ST elevation MI as above, repeat admission in mid April for AMS 2/2 sepsis 2/2 UTI who presented to [facility] on 4[facility]29 with AMS, found to be bacteremic with GNR.  Assessment & Plan    1. Elevated troponin/CAD: -Recent anterior ST elevation MI as above -Has been altered this  admission, though has not noted chest pain or SOB -Troponin peaked at 0.30 so far, trend this AM -Likely demand ischemia in the setting of her GNR bacteremia with sepsis -Echo on 4/30 with preserved EF and wall motion, some degree of HCOM -Continue to hold heparin gtt at this time  -Daughter reports patient would not want further invasive testing or extraordinary measures -DAPT -Not on beta blocker 2/2 hypotension  2. Possible arrhythmia: -Appears to be most c/w artifact from tremor/shaking -No evidence of malignant arrhythmia -Maintaining sinus rhythm at this time -Magnesium 1.8-->IV repletion  3. Sepsis with bacteremia: -Blood cultures x 2 positive for GNR -On vancomycin and Zosyn per IM  4. Anemia: -Patient very pale this morning -HGB down trending to 7.5 this AM, possibly some component of dilution -Recommend maintaining HGB > 8.5, evaluate for possible bleed given her history of GI bleeding in the past  5. PAF: -Remains in sinus rhythm -Not on full-dose anticoagulation given prior GI bleed and need for DAPT given PCI in March in the setting of anterior wall STEMI -Not on beta blocker 2/2 prior hypotension -Continue amiodarone 200 mg daily  5. AMS/tremor: -Likely 2/2 the above  6. Remaining per IM  7. Dispo: -Palliative Care on board   Signed, Marcille Blanco [place] Pager: [phone] 06/14/2016, 9:28 AM

## 2016-06-14 NOTE — Evaluation (Signed)
Physical Therapy Evaluation Patient Details Name: Hailey [name] MRN: [id number] DOB: 03/16/1932 Today's Date: 06/14/2016   History of Present Illness  Pt is a 83 y/o F who was sent to the ED from her SNF due to low blood pressure, lethargy, and confusion.  Pt found to have recurrent E coli bacteremia from urinary source.  Pt's PMH includes recent STEMI, CHF, COPD, DVT, fibromyalgia, gout, cerebral aneurysm, PE, shoulder surgery, L TKA.      Clinical Impression  Pt admitted with above diagnosis. Pt currently with functional limitations due to the deficits listed below (see PT Problem List). Hailey [name] is from SNF where she was ambulating to the bathroom with RW and assist and was requiring assist for bathing and dressing.  She currently requires min assist for transfers and close min guard assist for short distance ambulation in room.  Given pt's current mobility status, recommend SNF at d/c. Pt will benefit from skilled PT to increase their independence and safety with mobility to allow discharge to the venue listed below.      Follow Up Recommendations SNF    Equipment Recommendations   (TBD at next venue of care)    Recommendations for Other Services       Precautions / Restrictions Precautions Precautions: Fall;Other (comment) Precaution Comments: monitor O2 Restrictions Weight Bearing Restrictions: No      Mobility  Bed Mobility Overal bed mobility: Needs Assistance Bed Mobility: Supine to Sit     Supine to sit: Min guard;HOB elevated     General bed mobility comments: Cues for sequencing and pt relies heavily on bed rail.  Increased effort and time with HOB elevated.  Transfers Overall transfer level: Needs assistance Equipment used: Rolling walker (2 wheeled) Transfers: Sit to/from Stand Sit to Stand: Min assist         General transfer comment: Pt requires cues for hand placement and assist to boost to standing.  Cues to reach back for armrests when preparing to  sit.  Ambulation/Gait Ambulation/Gait assistance: Min guard Ambulation Distance (Feet): 8 Feet Assistive device: Rolling walker (2 wheeled) Gait Pattern/deviations: Decreased stride length;Trunk flexed Gait velocity: decreased Gait velocity interpretation: Below normal speed for age/gender General Gait Details: Pt with flexed posture which improves only slightly with cues for upright posture.  She fatigues quickly and SpO2 down as low as 89% on 2L O2.   Stairs            Wheelchair Mobility    Modified Rankin (Stroke Patients Only)       Balance Overall balance assessment: Needs assistance Sitting-balance support: No upper extremity supported;Feet supported Sitting balance-Leahy Scale: Good     Standing balance support: Bilateral upper extremity supported;During functional activity Standing balance-Leahy Scale: Poor Standing balance comment: Relies on UE support for static and dynamic activities                             Pertinent Vitals/Pain Pain Assessment: No/denies pain    Home Living Family/patient expects to be discharged to:: Skilled nursing facility                 Additional Comments: Is from [facility]    Prior Function Level of Independence: Needs assistance   Gait / Transfers Assistance Needed: Pt has been ambulating to the bathroom with RW with staff at [facility] [employer] Needed: Pt requires assist for bathing, dressing.  Hand Dominance        Extremity/Trunk Assessment   Upper Extremity Assessment Upper Extremity Assessment: Generalized weakness (Strength grossly at least 3/5 BUEs)    Lower Extremity Assessment Lower Extremity Assessment: Generalized weakness (Strength grossly at least 3/5 BUEs)    Cervical / Trunk Assessment Cervical / Trunk Assessment: Kyphotic  Communication   Communication: No difficulties  Cognition Arousal/Alertness: Awake/alert Behavior During  Therapy: WFL for tasks assessed/performed Overall Cognitive Status: Within Functional Limits for tasks assessed                                        General Comments      Exercises Total Joint Exercises Ankle Circles/Pumps: AROM;Both;10 reps;Supine Quad Sets: Strengthening;Both;10 reps;Supine   Assessment/Plan    PT Assessment Patient needs continued PT services  PT Problem List Decreased strength;Decreased activity tolerance;Decreased balance;Decreased mobility;Decreased knowledge of use of DME;Decreased safety awareness;Cardiopulmonary status limiting activity       PT Treatment Interventions DME instruction;Gait training;Functional mobility training;Therapeutic activities;Therapeutic exercise;Balance training;Neuromuscular re-education;Patient/family education;Wheelchair mobility training    PT Goals (Current goals can be found in the Care Plan section)  Acute Rehab PT Goals Patient Stated Goal: To get stronger and walk better PT Goal Formulation: With patient Time For Goal Achievement: 06/28/16 Potential to Achieve Goals: Fair    Frequency Min 2X/week   Barriers to discharge        Co-evaluation               AM-PAC PT "6 Clicks" Daily Activity  Outcome Measure Difficulty turning over in bed (including adjusting bedclothes, sheets and blankets)?: Total Difficulty moving from lying on back to sitting on the side of the bed? : Total Difficulty sitting down on and standing up from a chair with arms (e.g., wheelchair, bedside commode, etc,.)?: Total Help needed moving to and from a bed to chair (including a wheelchair)?: A Little Help needed walking in hospital room?: A Little Help needed climbing 3-5 steps with a railing? : A Lot 6 Click Score: 11    End of Session Equipment Utilized During Treatment: Gait belt;Oxygen Activity Tolerance: Patient limited by fatigue Patient left: in chair;with call bell/phone within reach;with chair alarm  set Nurse Communication: Mobility status;Other (comment) (SpO2) PT Visit Diagnosis: Muscle weakness (generalized) (M62.81);Unsteadiness on feet (R26.81);Difficulty in walking, not elsewhere classified (R26.2)    Time: 1450-1511 PT Time Calculation (min) (ACUTE ONLY): 21 min   Charges:   PT Evaluation $PT Eval Low Complexity: 1 Procedure     PT G Codes:        Collie Siad PT, DPT 06/14/2016, 4:29 PM

## 2016-06-14 NOTE — NC FL2 (Signed)
[place] LEVEL OF CARE SCREENING TOOL     IDENTIFICATION  Patient Name: Hailey [name] Birthdate: 1932-03-20 Sex: female Admission Date (Current Location): 06/12/2016  [place] and [place] Number:  Engineering geologist and Address:  [facility], [address] [place], [place] [address]      Provider Number: [id number]  Attending Physician Name and Address:  Vaughan Basta, MD  Relative Name and Phone Number:  Lysle Rubens Daughter [phone] or  Zina, [place]   [phone] or Jessica Priest   [phone]     Current Level of Care: Hospital Recommended Level of Care: [place] Prior Approval Number:    Date Approved/Denied:   PASRR Number: [id number]  Discharge Plan: SNF    Current Diagnoses: Patient Active Problem List   Diagnosis Date Noted  . Sepsis ([place]) 06/13/2016  . Palliative care by specialist   . Goals of care, counseling/discussion   . Advance care planning   . Altered mental status 06/12/2016  . Sepsis secondary to UTI ([place]) 06/01/2016  . Acute ST elevation myocardial infarction (STEMI) involving left anterior descending (LAD) coronary artery ([place]) 05/01/2016  . STEMI (ST elevation myocardial infarction) ([place]) 05/01/2016  . Protein-calorie malnutrition, severe 10/23/2015  . Uncontrollable vomiting   . Nausea and vomiting 10/22/2015  . Tremor 07/07/2015  . Respiratory failure ([place]) 05/19/2015  . Weakness 01/27/2015  . Abdomen enlarged 09/26/2013  . CTCL (cutaneous T-cell lymphoma) ([place]) 09/18/2013  . Anxiety 08/09/2013  . CCF (congestive cardiac failure) ([place]) 08/09/2013  . Chronic pain 08/09/2013  . Chronic diastolic CHF (congestive heart failure) ([place]) 07/12/2013  . Benign essential HTN 07/01/2013  . Bilateral leg edema 06/06/2013  . PAF (paroxysmal atrial fibrillation) ([place]) 04/25/2013  . DOE (dyspnea on exertion) 10/11/2011  . COPD (chronic obstructive pulmonary disease) ([place])  10/09/2011  . Cognitive decline 10/09/2011  . Behavior concern 08/23/2011  . Chest pain 10/26/2010  . Palpitations 10/26/2010  . PHLEBITIS AND THROMBOPHLEBITIS OF FEMORAL VEIN 10/13/2008  . DEEP VENOUS THROMBOPHLEBITIS, LEG, LEFT 10/09/2008  . GASTRIC ULCER, ACUTE 10/09/2008  . PERSONAL HX COLONIC POLYPS 09/02/2008  . FATIGUE 08/27/2008  . HOCM (hypertrophic obstructive cardiomyopathy) ([place]) 11/06/2007  . HYPOTENSION, ORTHOSTATIC 11/06/2007  . ADENOMATOUS COLONIC POLYP 04/12/2007  . HIATAL HERNIA 04/12/2007  . Diverticulosis of colon (without mention of hemorrhage) 04/12/2007  . Hyperlipidemia 02/03/2007  . ANEMIA, CHRONIC 02/03/2007  . MIGRAINE HEADACHE 02/03/2007  . Essential hypertension 02/03/2007  . CEREBRAL ANEURYSM 02/03/2007  . GASTROESOPHAGEAL REFLUX DISEASE 02/03/2007  . OSTEOARTHRITIS 02/03/2007  . FIBROMYALGIA 02/03/2007  . CHRONIC FATIGUE SYNDROME 02/03/2007  . MITRAL VALVE PROLAPSE, HX OF 02/03/2007  . PULMONARY EMBOLISM, HX OF 02/03/2007  . TOTAL KNEE REPLACEMENT, LEFT, HX OF 02/03/2007  . HYSTERECTOMY, HX OF 02/03/2007  . Other acquired absence of organ 02/03/2007  . INGUINAL HERNIORRHAPHY, RIGHT, HX OF 02/03/2007    Orientation RESPIRATION BLADDER Height & Weight     Self, Time, Situation, Place  O2 (2L) Continent Weight: 160 lb (72.6 kg) Height:  5\' 1"  (154.9 cm)  BEHAVIORAL SYMPTOMS/MOOD NEUROLOGICAL BOWEL NUTRITION STATUS      Continent Diet (Cardiac)  AMBULATORY STATUS COMMUNICATION OF NEEDS Skin   Limited Assist Verbally Normal                       Personal Care Assistance Level of Assistance  Bathing, Feeding, Dressing Bathing Assistance: Limited assistance Feeding assistance: Limited assistance Dressing Assistance: Limited assistance     Functional  Limitations Info  Sight, Hearing, Speech Sight Info: Adequate Hearing Info: Adequate Speech Info: Adequate    SPECIAL CARE FACTORS FREQUENCY  PT (By licensed PT)                     Contractures Contractures Info: Not present    Additional Factors Info  Allergies, Code Status Code Status Info: DNR Allergies Info: ASPIRIN, CODEINE, COMPAZINE PROCHLORPERAZINE, KETOROLAC TROMETHAMINE, NSAIDS, PHENAZOPYRIDINE HCL, MACRODANTIN NITROFURANTOIN MACROCRYSTAL            Current Medications (06/14/2016):  This is the current hospital active medication list Current Facility-Administered Medications  Medication Dose Route Frequency Provider Last Rate Last Dose  . acetaminophen (TYLENOL) tablet 650 mg  650 mg Oral Q6H PRN Saundra Shelling, MD   650 mg at 06/13/16 0415   Or  . acetaminophen (TYLENOL) suppository 650 mg  650 mg Rectal Q6H PRN Pavan Pyreddy, MD      . albuterol (PROVENTIL) (2.5 MG/3ML) 0.083% nebulizer solution 2.5 mg  2.5 mg Inhalation Q6H PRN Pavan Pyreddy, MD      . albuterol (PROVENTIL) (2.5 MG/3ML) 0.083% nebulizer solution 2.5 mg  2.5 mg Nebulization Q4H PRN Pavan Pyreddy, MD      . amiodarone (PACERONE) tablet 200 mg  200 mg Oral Daily Saundra Shelling, MD   Stopped at 06/14/16 4709  . aspirin EC tablet 81 mg  81 mg Oral Daily Saundra Shelling, MD   81 mg at 06/14/16 0841  . atorvastatin (LIPITOR) tablet 80 mg  80 mg Oral q1800 Saundra Shelling, MD   80 mg at 06/14/16 1815  . carbidopa-levodopa (SINEMET IR) 25-250 MG per tablet immediate release 1 tablet  1 tablet Oral QID Saundra Shelling, MD   1 tablet at 06/14/16 2100  . [START ON 06/15/2016] clopidogrel (PLAVIX) tablet 300 mg  300 mg Oral Once Nelva Bush, MD      . Derrill Memo ON 06/16/2016] clopidogrel (PLAVIX) tablet 75 mg  75 mg Oral Daily Christopher End, MD      . docusate sodium (COLACE) capsule 100 mg  100 mg Oral BID Vaughan Basta, MD   100 mg at 06/14/16 2100  . enoxaparin (LOVENOX) injection 40 mg  40 mg Subcutaneous Q24H Saundra Shelling, MD   40 mg at 06/14/16 0219  . [START ON 06/15/2016] feeding supplement (ENSURE ENLIVE) (ENSURE ENLIVE) liquid 237 mL  237 mL Oral BID BM Vaughan Basta, MD      .  fluticasone (FLONASE) 50 MCG/ACT nasal spray 2 spray  2 spray Each Nare Daily PRN Pavan Pyreddy, MD      . levothyroxine (SYNTHROID, LEVOTHROID) tablet 50 mcg  50 mcg Oral QAC breakfast Saundra Shelling, MD   50 mcg at 06/14/16 0841  . LORazepam (ATIVAN) injection 0.5 mg  0.5 mg Intravenous Q4H PRN Vaughan Basta, MD   0.5 mg at 06/14/16 0219  . MEDLINE mouth rinse  15 mL Mouth Rinse BID Saundra Shelling, MD   15 mL at 06/14/16 0842  . meropenem (MERREM) IVPB SOLR 1 g  1 g Intravenous Q12H Vaughan Basta, MD   Stopped at 06/14/16 0900  . mometasone-formoterol (DULERA) 100-5 MCG/ACT inhaler 2 puff  2 puff Inhalation BID Saundra Shelling, MD   2 puff at 06/14/16 0841  . morphine 2 MG/ML injection 1 mg  1 mg Intravenous Q1H PRN Earlie Counts, NP   1 mg at 06/14/16 2052  . ondansetron (ZOFRAN) tablet 4 mg  4 mg Oral Q6H PRN Saundra Shelling, MD  Or  . ondansetron (ZOFRAN) injection 4 mg  4 mg Intravenous Q6H PRN Pavan Pyreddy, MD      . pantoprazole (PROTONIX) EC tablet 40 mg  40 mg Oral Daily Saundra Shelling, MD   40 mg at 06/14/16 0841  . potassium chloride SA (K-DUR,KLOR-CON) CR tablet 20 mEq  20 mEq Oral BID Vaughan Basta, MD   20 mEq at 06/14/16 2100  . sodium chloride flush (NS) 0.9 % injection 3 mL  3 mL Intravenous Q12H Saundra Shelling, MD   3 mL at 06/14/16 2052  . ticagrelor (BRILINTA) tablet 90 mg  90 mg Oral BID Nelva Bush, MD   90 mg at 06/14/16 2101  . tiotropium (SPIRIVA) inhalation capsule 18 mcg  18 mcg Inhalation Daily Saundra Shelling, MD   18 mcg at 06/14/16 9357     Discharge Medications: Please see discharge summary for a list of discharge medications.  Relevant Imaging Results:  Relevant Lab Results:   Additional Information SSN [id number]  Ross Ludwig, [place]

## 2016-06-14 NOTE — Progress Notes (Signed)
Daily Progress Note   Patient Name: Hailey [name]       Date: 06/14/2016 DOB: 1933/01/30  Age: 83 y.o. MRN#: [id number] Attending Physician: Vaughan Basta, MD Primary Care Physician: Marcello Fennel, MD Admit Date: 06/12/2016  Reason for Consultation/Follow-up: Establishing goals of care  Subjective: Patient much more alert and interactive today. Patient's son at bedside. She is eating and drinking. No complaints. She is ready to discharge- would not like to return to [facility].  Spoke with patient's daughter via telephone. Discussed that E.Coli sepsis likely from urinary retention per Dr. Ola Spurr- can be managed by urology. Recommend palliative f/u in community for Parkinson's. Patient's daughter did express concern re: patient being discharged too early last admission and her infection recurring quickly.   Patient has not been out of bed yet during this admission.  ROS  Length of Stay: 2  Current Medications: Scheduled Meds:  . amiodarone  200 mg Oral Daily  . aspirin EC  81 mg Oral Daily  . atorvastatin  80 mg Oral q1800  . carbidopa-levodopa  1 tablet Oral QID  . docusate sodium  100 mg Oral BID  . enoxaparin (LOVENOX) injection  40 mg Subcutaneous Q24H  . levothyroxine  50 mcg Oral QAC breakfast  . mouth rinse  15 mL Mouth Rinse BID  . mometasone-formoterol  2 puff Inhalation BID  . pantoprazole  40 mg Oral Daily  . potassium chloride  20 mEq Oral BID  . sodium chloride flush  3 mL Intravenous Q12H  . ticagrelor  90 mg Oral BID  . tiotropium  18 mcg Inhalation Daily    Continuous Infusions: . meropenem 1 g (06/14/16 0830)    PRN Meds: acetaminophen **OR** acetaminophen, albuterol, albuterol, fluticasone, LORazepam, morphine injection, ondansetron **OR**  ondansetron (ZOFRAN) IV  Physical Exam          Vital Signs: BP (!) 109/35 (BP Location: Left Arm)   Pulse (!) 53   Temp 97.9 F (36.6 C) (Oral)   Resp 20   Ht 5\' 1"  (1.549 m)   Wt 72.6 kg (160 lb)   SpO2 100%   BMI 30.23 kg/m  SpO2: SpO2: 100 % O2 Device: O2 Device: Nasal Cannula O2 Flow Rate: O2 Flow Rate (L/min): 4 L/min  Intake/output summary:  Intake/Output Summary (Last 24 hours) at  06/14/16 0925 Last data filed at 06/14/16 7371  Gross per 24 hour  Intake             1962 ml  Output             1400 ml  Net              562 ml   LBM:   Baseline Weight: Weight: 72.6 kg (160 lb) Most recent weight: Weight: 72.6 kg (160 lb)       Palliative Assessment/Data: PPS: 30%    Flowsheet Rows     Most Recent Value  Intake Tab  Referral Department  Hospitalist  Unit at Time of Referral  Oncology Unit  Palliative Care Primary Diagnosis  Sepsis/Infectious Disease  Date Notified  06/13/16  Palliative Care Type  New Palliative care  Reason for referral  Clarify Goals of Care  Date of Admission  06/12/16  Date first seen by Palliative Care  06/13/16  # of days Palliative referral response time  0 Day(s)  # of days IP prior to Palliative referral  1  Clinical Assessment  Psychosocial & Spiritual Assessment  Palliative Care Outcomes      Patient Active Problem List   Diagnosis Date Noted  . Sepsis ([place]) 06/13/2016  . Palliative care by specialist   . Goals of care, counseling/discussion   . Advance care planning   . Altered mental status 06/12/2016  . Sepsis secondary to UTI ([place]) 06/01/2016  . Acute ST elevation myocardial infarction (STEMI) involving left anterior descending (LAD) coronary artery ([place]) 05/01/2016  . STEMI (ST elevation myocardial infarction) ([place]) 05/01/2016  . Protein-calorie malnutrition, severe 10/23/2015  . Uncontrollable vomiting   . Nausea and vomiting 10/22/2015  . Tremor 07/07/2015  . Respiratory failure ([place]) 05/19/2015  . Weakness  01/27/2015  . Abdomen enlarged 09/26/2013  . CTCL (cutaneous T-cell lymphoma) ([place]) 09/18/2013  . Anxiety 08/09/2013  . CCF (congestive cardiac failure) ([place]) 08/09/2013  . Chronic pain 08/09/2013  . Chronic diastolic CHF (congestive heart failure) ([place]) 07/12/2013  . Benign essential HTN 07/01/2013  . Bilateral leg edema 06/06/2013  . PAF (paroxysmal atrial fibrillation) ([place]) 04/25/2013  . DOE (dyspnea on exertion) 10/11/2011  . COPD (chronic obstructive pulmonary disease) ([place]) 10/09/2011  . Cognitive decline 10/09/2011  . Behavior concern 08/23/2011  . Chest pain 10/26/2010  . Palpitations 10/26/2010  . PHLEBITIS AND THROMBOPHLEBITIS OF FEMORAL VEIN 10/13/2008  . DEEP VENOUS THROMBOPHLEBITIS, LEG, LEFT 10/09/2008  . GASTRIC ULCER, ACUTE 10/09/2008  . PERSONAL HX COLONIC POLYPS 09/02/2008  . FATIGUE 08/27/2008  . HOCM (hypertrophic obstructive cardiomyopathy) ([place]) 11/06/2007  . HYPOTENSION, ORTHOSTATIC 11/06/2007  . ADENOMATOUS COLONIC POLYP 04/12/2007  . HIATAL HERNIA 04/12/2007  . Diverticulosis of colon (without mention of hemorrhage) 04/12/2007  . Hyperlipidemia 02/03/2007  . ANEMIA, CHRONIC 02/03/2007  . MIGRAINE HEADACHE 02/03/2007  . Essential hypertension 02/03/2007  . CEREBRAL ANEURYSM 02/03/2007  . GASTROESOPHAGEAL REFLUX DISEASE 02/03/2007  . OSTEOARTHRITIS 02/03/2007  . FIBROMYALGIA 02/03/2007  . CHRONIC FATIGUE SYNDROME 02/03/2007  . MITRAL VALVE PROLAPSE, HX OF 02/03/2007  . PULMONARY EMBOLISM, HX OF 02/03/2007  . TOTAL KNEE REPLACEMENT, LEFT, HX OF 02/03/2007  . HYSTERECTOMY, HX OF 02/03/2007  . Other acquired absence of organ 02/03/2007  . INGUINAL HERNIORRHAPHY, RIGHT, HX OF 02/03/2007    Palliative Care Assessment & Plan   Patient Profile: 83 y.o. female  with past medical history of Parkinson's, STEMI in March (S/P stenting of LAD), afib, anxiety, cerebral aneurysm, CHF, COPD, fibromyalgia,  gout, hypertrophic cardiomyopathy and UTI E.Coli sepsis  (d/c'd 4/20 to SNF) admitted on 06/12/2016 from SNF with increasing somnolence, fever (reportedly max 103), found to be hypotensive and hypoxic in the ED. Workup revealed recurrent sepsis likely from UTI, and elevated troponin (likely demand ischemia). Palliative medicine consulted for [place].   Assessment/Recommendations/Plan   Recommend palliative f/u at discharge  Recommend PT consult before discharge  Goals of Care and Additional Recommendations:  Limitations on Scope of Treatment: Full Scope Treatment  Code Status:  DNR  Prognosis:   Unable to determine  Discharge Planning:  [place] for rehab with Palliative care service follow-up  Care plan was discussed with patient's daughter, son and patient.  Thank you for allowing the Palliative Medicine Team to assist in the care of this patient.   Time In: 1500 Time Out: 1530 Total Time 30 mins Prolonged Time Billed No      Greater than 50%  of this time was spent counseling and coordinating care related to the above assessment and plan.  Mariana Kaufman, AGNP-C Palliative Medicine   Please contact Palliative Medicine Team phone at [phone] for questions and concerns.

## 2016-06-14 NOTE — Telephone Encounter (Signed)
Called by Dr. Ola Spurr today to discuss the current inpatient. She's had recurrent Escherichia coli bacteremia, ESBL 2. During this admission, she was noted to be in urinary retention with a liter in her bladder. She'll be discharged home with a catheter in place. She will need outpatient voiding trial in likely to learn clean intermittent catheterization at that visit.  I will facilitate this.  Hollice Espy, MD

## 2016-06-14 NOTE — Progress Notes (Addendum)
[facility] [place] NAME: Hailey [name]    MR#:  [id number]  DATE OF BIRTH:  May 20, 1932  SUBJECTIVE:  CHIEF COMPLAINT:   Chief Complaint  Patient presents with  . Hypotension     Recent admission with Escherichia coli bacteremia and UTI, sent to rehabilitation with oral antibiotic depending on sensitivities which she finished and sent back again with altered mental status and hypotension   Found to have urosepsis again with UTI.   Blood pressure responded to IV fluids, currently on broad-spectrum IV antibiotics.   06/13/16 morning patient had episode of excessive shaking and was noted to have recording of V. tach on cardiac monitoring.   After giving IV Ativan injection patient is more comfortable.    HR is stable now.  REVIEW OF SYSTEMS:   Patient is alert but appears to have some dementia and not able to give any review of system to me.  ROS  DRUG ALLERGIES:   Allergies  Allergen Reactions  . Aspirin Other (See Comments)    Reaction:  GI bleeding   . Codeine Nausea And Vomiting  . Compazine [Prochlorperazine] Other (See Comments)    Confusion  . Ketorolac Tromethamine Other (See Comments)    Reaction:  Headache   . Nsaids Other (See Comments)    Reaction:  GI bleeding   . Phenazopyridine Hcl Other (See Comments)    Reaction:  Vision problems   . Macrodantin [Nitrofurantoin Macrocrystal] Rash    VITALS:  Blood pressure (!) 104/40, pulse (!) 58, temperature 97.6 F (36.4 C), temperature source Oral, resp. rate 18, height 5\' 1"  (1.549 m), weight 72.6 kg (160 lb), SpO2 100 %.  PHYSICAL EXAMINATION:    GENERAL:  83 y.o.-year-old patient lying in the bed with no acute distress.  EYES: Pupils equal, round, reactive to light and accommodation. No scleral icterus. Extraocular muscles intact.  HEENT: Head atraumatic, normocephalic. Oropharynx and nasopharynx clear.  NECK:  Supple, no jugular venous distention. No thyroid enlargement,  no tenderness.  LUNGS: Normal breath sounds bilaterally, no wheezing, rales,rhonchi or crepitation. No use of accessory muscles of respiration.  CARDIOVASCULAR: S1, S2 regular. No murmurs, rubs, or gallops.  ABDOMEN: Soft, nontender, nondistended. Bowel sounds present. No organomegaly or mass.  EXTREMITIES: No pedal edema, cyanosis, or clubbing.  NEUROLOGIC: Cranial nerves II through XII are intact. Muscle strength 3-4/5 in all extremities. Sensation and Gait not checked.  PSYCHIATRIC: The patient is alert and not oriented x 3.  SKIN: No obvious rash, lesion, or ulcer.   Physical Exam LABORATORY PANEL:   CBC  Recent Labs Lab 06/14/16 0439  WBC 13.2*  HGB 7.5*  HCT 22.0*  PLT 264   ------------------------------------------------------------------------------------------------------------------  Chemistries   Recent Labs Lab 06/12/16 1845  06/13/16 0836 06/14/16 0439  NA 134*  < >  --  135  K 3.8  < >  --  3.3*  CL 99*  < >  --  106  CO2 24  < >  --  23  GLUCOSE 108*  < >  --  125*  BUN 11  < >  --  10  CREATININE 1.10*  < >  --  0.82  CALCIUM 8.8*  < >  --  8.1*  MG  --   --  1.8  --   AST 27  --   --   --   ALT 7*  --   --   --   ALKPHOS 99  --   --   --  BILITOT 0.9  --   --   --   < > = values in this interval not displayed. ------------------------------------------------------------------------------------------------------------------  Cardiac Enzymes  Recent Labs Lab 06/13/16 0836 06/13/16 1420  TROPONINI 0.27* 0.30*   ------------------------------------------------------------------------------------------------------------------  RADIOLOGY:  Dg Chest Port 1 View  Result Date: 06/12/2016 CLINICAL DATA:  Hypoxia EXAM: PORTABLE CHEST 1 VIEW COMPARISON:  06/01/16 FINDINGS: Cardiac shadow is within normal limits. The lungs are well aerated bilaterally. No focal infiltrate or sizable effusion is seen. No bony abnormality is noted. IMPRESSION: No acute  abnormality noted. Electronically Signed   By: Inez Catalina M.D.   On: 06/12/2016 19:16   Ct Renal Stone Study  Result Date: 06/13/2016 CLINICAL DATA:  83 year old female with recent hospital admission for E coli bacteremia and urinary tract infection, treated with oral antibiotic therapy, presenting with altered mental status and hypotension. Urosepsis with recurrent urinary tract infection. EXAM: CT ABDOMEN AND PELVIS WITHOUT CONTRAST TECHNIQUE: Multidetector CT imaging of the abdomen and pelvis was performed following the standard protocol without IV contrast. COMPARISON:  CT the abdomen and pelvis 10/19/2015. MRI of the abdomen 12/11/2015. FINDINGS: Lower chest: Calcifications of the aortic valve and severe calcifications of the mitral annulus. Atherosclerotic calcifications in the right coronary artery. Areas of scarring and/or subsegmental atelectasis throughout the visualized lung bases bilaterally. Hepatobiliary: No definite cystic or solid hepatic lesions are identified on today's noncontrast CT examination. Gallbladder is moderately distended. Small amount of amorphous dependent intermediate attenuation material lying in the gallbladder, presumably biliary sludge. No surrounding inflammatory changes to suggest an acute cholecystitis at this time. Pancreas: No definite pancreatic mass or peripancreatic inflammatory changes are noted on today's noncontrast CT examination. Spleen: Unremarkable. Adrenals/Urinary Tract: No calcifications are identified within the collecting system of either kidney, along the course of either ureter, or within the lumen of the urinary bladder. There is no hydroureteronephrosis to indicate urinary tract obstruction at this time. Low-attenuation lesions in both kidneys are again noted, incompletely characterized on today's noncontrast CT examination, but previously characterized as simple cysts, measuring up to 3.5 cm in the anterior aspect of the interpolar region of the  right kidney. Bilateral adrenal glands are normal in appearance. Urinary bladder is grossly unremarkable in appearance. Stomach/Bowel: The unenhanced appearance of the stomach is normal. There is no pathologic dilatation of small bowel or colon. Several colonic diverticulae are noted, without surrounding inflammatory changes to suggest an acute diverticulitis at this time. The appendix is not confidently identified and may be surgically absent. Regardless, there are no inflammatory changes noted adjacent to the cecum to suggest the presence of an acute appendicitis at this time. Vascular/Lymphatic: Aortic atherosclerosis, without evidence of aneurysm in the abdominal or pelvic vasculature. IVC filter in position with tip terminating at the level of the renal veins. No lymphadenopathy noted in the abdomen or pelvis on today's noncontrast CT examination. Reproductive: Status post hysterectomy. Ovaries are not confidently identified may be surgically absent or atrophic. Other: No significant volume of ascites.  No pneumoperitoneum. Musculoskeletal: Chronic compression fracture of T12 is unchanged with approximately 40% loss of anterior vertebral body height. 7 mm of anterolisthesis of L4 upon L5. There are no aggressive appearing lytic or blastic lesions noted in the visualized portions of the skeleton. IMPRESSION: 1. No acute findings are noted in the abdomen or pelvis. Specifically, no urinary tract calculi no findings of urinary tract obstruction. 2. Biliary sludge in the gallbladder, without definite evidence to suggest an acute cholecystitis at this time. 3. Aortic  atherosclerosis, in addition to at least right coronary artery disease. 4. Colonic diverticulosis without definite evidence to suggest an acute diverticulitis at this time. 5. Additional incidental findings, as above. Electronically Signed   By: Vinnie Langton M.D.   On: 06/13/2016 11:24    ASSESSMENT AND PLAN:   Active Problems:   Altered  mental status   Sepsis ([place])   Palliative care by specialist   Goals of care, counseling/discussion   Advance care planning  * Sepsis with UTI   Recently treated for Escherichia coli bacteremia and UTI, blood culture is sent in this admission still in process- showing gram negative rods.   Possibly this episode appears to be continuation from the last episode.   She was on appropriate antibiotics at the time of discharge as per the culture results available.   CT renal study is negative.   IV fluids, blood pressure is under control.   IV meropenem now, as bl cx is positive , MRSA screen is negative.  * Ventricular tachycardia ruled out.   This was noted on cardiac monitoring, appears to be artifact as this was while the patient had excessive shaking.     Cardiology consult appreciated.    Was found to have artifacts  * Elevated troponin   Most likely this is demand ischemia secondary to sepsis, nontender coagulation at this time, cardiology consult appreciated.  *  History of coronary artery disease   Continue aspirin, brilanta, atorvastatin.  * Parkinson's disease   Continue carbidopa and levodopa.    * Hypothyroidism    continue levothyroxine, checked- noraml TSH.  * urinary retention   Foley cath placed- after having > 900 ml residual urine last night.  * Anemia   Check [place].  All the records are reviewed and case discussed with Care Management/Social Workerr. Management plans discussed with the patient, family and they are in agreement.  CODE STATUS: DNR  TOTAL TIME TAKING CARE OF THIS PATIENT: 35 minutes.   POSSIBLE D/C IN 2-3 DAYS, DEPENDING ON CLINICAL CONDITION.   Vaughan Basta M.D on 06/14/2016   Between 7am to 6pm - Pager - [phone]  After 6pm go to [email or web address] - password [email or web address] [facility]  Office  [phone]  CC: Primary care physician; BABAOFF, Caryl Bis, MD  Note: This dictation was prepared with Dragon  dictation along with smaller phrase technology. Any transcriptional errors that result from this process are unintentional.

## 2016-06-14 NOTE — Consult Note (Signed)
[facility]     Reason for Consult:  E coli bacteremia  Referring Physician: Dolores Frame Date of Admission:  06/12/2016   Active Problems:   Altered mental status   Sepsis ([place])   Palliative care by specialist   Goals of care, counseling/discussion   Advance care planning   HPI: Hailey [name] is a 83 y.o. female with past medical history of Parkinson's, STEMI in March (S/P stenting of LAD), afib, anxiety, cerebral aneurysm, CHF, COPD, fibromyalgia, gout, hypertrophic cardiomyopathy and UTI E.Coli sepsis (d/c'd 4/20 to SNF) admitted on 06/12/2016 from SNF with increasing somnolence, fever (reportedly max 103), found to be hypotensive and hypoxic in the ED. Workup revealed recurrent sepsis likely from UTI, and elevated troponin (likely demand ischemia).  E coli bacteremia was noted on this admission as well as on past one.  Yesterday she had rapid response called and also noted to have urine retention with 1000 cc out when cath placed.  CT stone protocol negative.     Past Medical History:  Diagnosis Date  . Anemia    Chronic  . Anxiety   . Arthritis   . Asthma   . Atrophic vaginitis   . Cerebral aneurysm    Hx of  . CHF (congestive heart failure) ([place])   . COPD (chronic obstructive pulmonary disease) ([place])   . Diverticulitis   . DVT (deep vein thrombosis) in pregnancy ([place]) 09/2008   a. LLE, recurrent hx, has IVC filter. Not on coumadin now with history of GI bleeding  . Fibrocystic disease of breast   . Fibromyalgia   . GERD (gastroesophageal reflux disease)   . Gout   . Gross hematuria   . Heart murmur   . History of colonoscopy   . Hyperlipidemia   . Hypertension   . Hypertrophic cardiomyopathy ([place])    a. Echo 4/09 with EF 70-75%, asymmetricy basal septal hypertrophy, mild MR without systolic anterior motion of the mitral valve, LVOT gradient to 130 mmHg with Valsalva b. Echo 7/13: EF 60-65%, mild focal basal septal hypertrophy, no significant LVOT  gradient, no MV SAM c. Echo 2/15: EF 55-60%, HOCM, resting LVOT gradient 29 mmHg, Valsalva LVOT gradient > 140 mmHg, mild LVH, mild TR, elevated PASP  . Incomplete bladder emptying   . Migraine headache    Hx of  . Obesity   . OSA (obstructive sleep apnea)    a. Intolerant to CPAP, wears 2L via n/c  . Osteoarthritis    Hx of left TKR  . PAF (paroxysmal atrial fibrillation) ([place])    a. Full-dose ASA alone 2/2 h/o GIB.  Marland Kitchen PAT (paroxysmal atrial tachycardia) ([place])   . 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 Pneumonia   . PUD (peptic ulcer disease)    With GI bleeding  . Pulmonary embolism ([place])   . Tremor    Past Surgical History:  Procedure Laterality Date  . ABDOMINAL HYSTERECTOMY    . APPENDECTOMY    . BLADDER SUSPENSION    . CARDIAC CATHETERIZATION  2009   No significant CAD  . CARDIOVASCULAR STRESS TEST     a. Lexiscan Myoview 3/14: EF 76%, no evidence of ischemia or WMAs  . cataract surgery    . CEREBRAL ANEURYSM REPAIR  1998  . CORONARY STENT INTERVENTION N/A 05/01/2016   Procedure: Coronary Stent Intervention;  Surgeon: Yolonda Kida, MD;  Location: [facility];  Service: Cardiovascular;  Laterality: N/A;  . ESOPHAGOGASTRODUODENOSCOPY (  EGD) WITH PROPOFOL N/A 11/24/2015   Procedure: ESOPHAGOGASTRODUODENOSCOPY (EGD) WITH PROPOFOL;  Surgeon: Lollie Sails, MD;  Location: [facility];  Service: Endoscopy;  Laterality: N/A;  . FOOT SURGERY     Bilateral  . INGUINAL HERNIA REPAIR  1968   Right  . KNEE ARTHROSCOPY  2009   Right  . LEFT HEART CATH AND CORONARY ANGIOGRAPHY N/A 05/01/2016   Procedure: Left Heart Cath and Coronary Angiography;  Surgeon: Yolonda Kida, MD;  Location: [facility];  Service: Cardiovascular;  Laterality: N/A;  . SHOULDER SURGERY  1972   Right   Left 2012  . TONSILLECTOMY AND ADENOIDECTOMY    . TOTAL KNEE ARTHROPLASTY     Left   Social History  Substance Use Topics  . Smoking  status: Former Smoker    Packs/day: 1.00    Years: 60.00    Types: Cigarettes    Quit date: 12/15/2008  . Smokeless tobacco: Never Used     Comment: smoked for 50 years quit 5 + years  . Alcohol use 0.0 oz/week     Comment: Socially   Family History  Problem Relation Age of Onset  . Pancreatic cancer Mother   . Hypertension Mother     father  . Diabetes Mellitus II Mother     Sister  . Colon cancer Father   . Colon polyps Father   . Heart disease Father   . Stroke Father   . Heart disease Sister     More than 1 sister  . Colon cancer Brother   . Colon polyps Other     Siblings  . Ulcers Brother     Allergies:  Allergies  Allergen Reactions  . Aspirin Other (See Comments)    Reaction:  GI bleeding   . Codeine Nausea And Vomiting  . Compazine [Prochlorperazine] Other (See Comments)    Confusion  . Ketorolac Tromethamine Other (See Comments)    Reaction:  Headache   . Nsaids Other (See Comments)    Reaction:  GI bleeding   . Phenazopyridine Hcl Other (See Comments)    Reaction:  Vision problems   . Macrodantin [Nitrofurantoin Macrocrystal] Rash    Current antibiotics: Antibiotics Given (last 72 hours)    Date/Time Action Medication Dose Rate   06/12/16 1855 New Bag/Given   piperacillin-tazobactam (ZOSYN) IVPB 3.375 g 3.375 g 100 mL/hr   06/12/16 1855 New Bag/Given   vancomycin (VANCOCIN) IVPB 1000 mg/200 mL premix 1,000 mg 200 mL/hr   06/13/16 0415 New Bag/Given   piperacillin-tazobactam (ZOSYN) IVPB 3.375 g 3.375 g 12.5 mL/hr   06/13/16 0425 New Bag/Given   vancomycin (VANCOCIN) IVPB 1000 mg/200 mL premix 1,000 mg 200 mL/hr   06/13/16 1053 New Bag/Given   meropenem (MERREM) IVPB SOLR 1 g 1 g 100 mL/hr   06/13/16 2136 New Bag/Given   meropenem (MERREM) IVPB SOLR 1 g 1 g 100 mL/hr   06/14/16 0830 New Bag/Given   meropenem (MERREM) IVPB SOLR 1 g 1 g 100 mL/hr      MEDICATIONS: . amiodarone  200 mg Oral Daily  . aspirin EC  81 mg Oral Daily  .  atorvastatin  80 mg Oral q1800  . carbidopa-levodopa  1 tablet Oral QID  . [START ON 06/15/2016] clopidogrel  300 mg Oral Once  . [START ON 06/16/2016] clopidogrel  75 mg Oral Daily  . docusate sodium  100 mg Oral BID  . enoxaparin (LOVENOX) injection  40 mg Subcutaneous Q24H  . levothyroxine  50 mcg Oral QAC breakfast  . mouth rinse  15 mL Mouth Rinse BID  . mometasone-formoterol  2 puff Inhalation BID  . pantoprazole  40 mg Oral Daily  . potassium chloride  20 mEq Oral BID  . sodium chloride flush  3 mL Intravenous Q12H  . ticagrelor  90 mg Oral BID  . tiotropium  18 mcg Inhalation Daily    Review of Systems - 11 systems reviewed and negative per HPI   OBJECTIVE: Temp:  [97.6 F (36.4 C)-99 F (37.2 C)] 97.6 F (36.4 C) (05/01 1349) Pulse Rate:  [53-126] 58 (05/01 1349) Resp:  [18-22] 18 (05/01 1349) BP: (104-115)/(35-40) 104/40 (05/01 1349) SpO2:  [88 %-100 %] 100 % (05/01 1349) Physical Exam  Constitutional:  oriented to person, place, and time. appears well-developed and well-nourished. No distress. obese HENT: [place]/AT, PERRLA, no scleral icterus Mouth/Throat: Oropharynx is clear and moist. No oropharyngeal exudate.  Cardiovascular: Normal rate, regular rhythm and normal heart sounds.  Pulmonary/Chest: Effort normal and breath sounds normal. No respiratory distress.  has no wheezes.  Neck = supple, no nuchal rigidity Abdominal: Soft. Bowel sounds are normal.  exhibits no distension. There is no tenderness.  Lymphadenopathy: no cervical adenopathy. No axillary adenopathy Neurological: alert and oriented to person, place, and time.  Skin: Skin is warm and dry. No rash noted. No erythema.  Psychiatric: a normal mood and affect.  behavior is normal.  GU foley in place with yellow urine  LABS: Results for orders placed or performed during the hospital encounter of 06/12/16 (from the past 48 hour(s))  Comprehensive metabolic panel     Status: Abnormal   Collection Time: 06/12/16   6:45 PM  Result Value Ref Range   Sodium 134 (L) 135 - 145 mmol/L   Potassium 3.8 3.5 - 5.1 mmol/L   Chloride 99 (L) 101 - 111 mmol/L   CO2 24 22 - 32 mmol/L   Glucose, Bld 108 (H) 65 - 99 mg/dL   BUN 11 6 - 20 mg/dL   Creatinine, Ser 1.10 (H) 0.44 - 1.00 mg/dL   Calcium 8.8 (L) 8.9 - 10.3 mg/dL   Total Protein 6.1 (L) 6.5 - 8.1 g/dL   Albumin 3.1 (L) 3.5 - 5.0 g/dL   AST 27 15 - 41 U/L   ALT 7 (L) 14 - 54 U/L   Alkaline Phosphatase 99 38 - 126 U/L   Total Bilirubin 0.9 0.3 - 1.2 mg/dL   GFR calc non Af Amer 46 (L) >60 mL/min   GFR calc Af Amer 53 (L) >60 mL/min    Comment: (NOTE) The eGFR has been calculated using the CKD EPI equation. This calculation has not been validated in all clinical situations. eGFR's persistently <60 mL/min signify possible Chronic Kidney Disease.    Anion gap 11 5 - 15  CBC WITH DIFFERENTIAL     Status: Abnormal   Collection Time: 06/12/16  6:45 PM  Result Value Ref Range   WBC 25.5 (H) 3.6 - 11.0 K/uL   RBC 3.09 (L) 3.80 - 5.20 MIL/uL   Hemoglobin 9.6 (L) 12.0 - 16.0 g/dL   HCT 28.0 (L) 35.0 - 47.0 %   MCV 90.5 80.0 - 100.0 fL   MCH 31.0 26.0 - 34.0 pg   MCHC 34.3 32.0 - 36.0 g/dL   RDW 14.2 11.5 - 14.5 %   Platelets 405 150 - 440 K/uL   Neutrophils Relative % 90 %   Neutro Abs 23.0 (H) 1.4 - 6.5 K/uL  Lymphocytes Relative 3 %   Lymphs Abs 0.8 (L) 1.0 - 3.6 K/uL   Monocytes Relative 7 %   Monocytes Absolute 1.7 (H) 0.2 - 0.9 K/uL   Eosinophils Relative 0 %   Eosinophils Absolute 0.0 0 - 0.7 K/uL   Basophils Relative 0 %   Basophils Absolute 0.0 0 - 0.1 K/uL  Blood Culture (routine x 2)     Status: Abnormal (Preliminary result)   Collection Time: 06/12/16  6:45 PM  Result Value Ref Range   Specimen Description BLOOD RFA    Special Requests      BOTTLES DRAWN AEROBIC AND ANAEROBIC Blood Culture adequate volume   Culture  Setup Time      GRAM NEGATIVE RODS IN BOTH AEROBIC AND ANAEROBIC BOTTLES CRITICAL RESULT CALLED TO, READ BACK BY  AND VERIFIED WITH: CHRISTINE KATSOUDAS AT 7048 06/13/16 SDR    Culture (A)     ESCHERICHIA COLI SUSCEPTIBILITIES TO FOLLOW Performed at [facility], [place] [address], [place], [place] [address]    Report Status PENDING   Lactic acid, plasma     Status: None   Collection Time: 06/12/16  6:45 PM  Result Value Ref Range   Lactic Acid, Venous 1.2 0.5 - 1.9 mmol/L  Troponin I     Status: Abnormal   Collection Time: 06/12/16  6:45 PM  Result Value Ref Range   Troponin I 0.18 (HH) <0.03 ng/mL    Comment: CRITICAL RESULT CALLED TO, READ BACK BY AND VERIFIED WITH EMMA HUNTER RN AT 1935 06/12/16 MSS.   Blood Culture ID Panel (Reflexed)     Status: Abnormal   Collection Time: 06/12/16  6:45 PM  Result Value Ref Range   Enterococcus species NOT DETECTED NOT DETECTED   Listeria monocytogenes NOT DETECTED NOT DETECTED   Staphylococcus species NOT DETECTED NOT DETECTED   Staphylococcus aureus NOT DETECTED NOT DETECTED   Streptococcus species NOT DETECTED NOT DETECTED   Streptococcus agalactiae NOT DETECTED NOT DETECTED   Streptococcus pneumoniae NOT DETECTED NOT DETECTED   Streptococcus pyogenes NOT DETECTED NOT DETECTED   Acinetobacter baumannii NOT DETECTED NOT DETECTED   Enterobacteriaceae species DETECTED (A) NOT DETECTED    Comment: Enterobacteriaceae represent a large family of gram-negative bacteria, not a single organism. CRITICAL RESULT CALLED TO, READ BACK BY AND VERIFIED WITH:  CHRISTINE KATSOUDAS AT 9450 06/13/16 SDR    Enterobacter cloacae complex NOT DETECTED NOT DETECTED   Escherichia coli DETECTED (A) NOT DETECTED    Comment: CRITICAL RESULT CALLED TO, READ BACK BY AND VERIFIED WITH:  [place] AT 3888 06/13/16 SDR    Klebsiella oxytoca NOT DETECTED NOT DETECTED   Klebsiella pneumoniae NOT DETECTED NOT DETECTED   Proteus species NOT DETECTED NOT DETECTED   Serratia marcescens NOT DETECTED NOT DETECTED   Carbapenem resistance NOT DETECTED NOT DETECTED    Haemophilus influenzae NOT DETECTED NOT DETECTED   Neisseria meningitidis NOT DETECTED NOT DETECTED   Pseudomonas aeruginosa NOT DETECTED NOT DETECTED   Candida albicans NOT DETECTED NOT DETECTED   Candida glabrata NOT DETECTED NOT DETECTED   Candida krusei NOT DETECTED NOT DETECTED   Candida parapsilosis NOT DETECTED NOT DETECTED   Candida tropicalis NOT DETECTED NOT DETECTED  Blood Culture (routine x 2)     Status: None (Preliminary result)   Collection Time: 06/12/16  6:47 PM  Result Value Ref Range   Specimen Description BLOOD BLOOD LEFT HAND    Special Requests      BOTTLES DRAWN AEROBIC AND  ANAEROBIC Blood Culture adequate volume   Culture  Setup Time      GRAM NEGATIVE RODS IN BOTH AEROBIC AND ANAEROBIC BOTTLES CRITICAL VALUE NOTED.  VALUE IS CONSISTENT WITH PREVIOUSLY REPORTED AND CALLED VALUE.    Culture GRAM NEGATIVE RODS    Report Status PENDING   Urinalysis, Routine w reflex microscopic     Status: Abnormal   Collection Time: 06/12/16  7:21 PM  Result Value Ref Range   Color, Urine YELLOW (A) YELLOW   APPearance HAZY (A) CLEAR   Specific Gravity, Urine 1.008 1.005 - 1.030   pH 6.0 5.0 - 8.0   Glucose, UA NEGATIVE NEGATIVE mg/dL   Hgb urine dipstick NEGATIVE NEGATIVE   Bilirubin Urine NEGATIVE NEGATIVE   Ketones, ur NEGATIVE NEGATIVE mg/dL   Protein, ur NEGATIVE NEGATIVE mg/dL   Nitrite NEGATIVE NEGATIVE   Leukocytes, UA SMALL (A) NEGATIVE   RBC / HPF 0-5 0 - 5 RBC/hpf   WBC, UA TOO NUMEROUS TO COUNT 0 - 5 WBC/hpf   Bacteria, UA RARE (A) NONE SEEN   Squamous Epithelial / LPF 0-5 (A) NONE SEEN   WBC Clumps PRESENT    Mucous PRESENT    Hyaline Casts, UA PRESENT   Urine culture     Status: None (Preliminary result)   Collection Time: 06/12/16  7:21 PM  Result Value Ref Range   Specimen Description URINE, CATHETERIZED    Special Requests NONE    Culture      CULTURE REINCUBATED FOR BETTER GROWTH Performed at [facility], [address], [place],  [place] [address]    Report Status PENDING   Basic metabolic panel     Status: Abnormal   Collection Time: 06/13/16  3:12 AM  Result Value Ref Range   Sodium 136 135 - 145 mmol/L   Potassium 3.9 3.5 - 5.1 mmol/L   Chloride 103 101 - 111 mmol/L   CO2 25 22 - 32 mmol/L   Glucose, Bld 106 (H) 65 - 99 mg/dL   BUN 11 6 - 20 mg/dL   Creatinine, Ser 1.08 (H) 0.44 - 1.00 mg/dL   Calcium 8.5 (L) 8.9 - 10.3 mg/dL   GFR calc non Af Amer 47 (L) >60 mL/min   GFR calc Af Amer 54 (L) >60 mL/min    Comment: (NOTE) The eGFR has been calculated using the CKD EPI equation. This calculation has not been validated in all clinical situations. eGFR's persistently <60 mL/min signify possible Chronic Kidney Disease.    Anion gap 8 5 - 15  CBC     Status: Abnormal   Collection Time: 06/13/16  3:12 AM  Result Value Ref Range   WBC 15.7 (H) 3.6 - 11.0 K/uL   RBC 2.80 (L) 3.80 - 5.20 MIL/uL   Hemoglobin 8.6 (L) 12.0 - 16.0 g/dL   HCT 25.7 (L) 35.0 - 47.0 %   MCV 91.5 80.0 - 100.0 fL   MCH 30.6 26.0 - 34.0 pg   MCHC 33.5 32.0 - 36.0 g/dL   RDW 14.5 11.5 - 14.5 %   Platelets 333 150 - 440 K/uL  Troponin I     Status: Abnormal   Collection Time: 06/13/16  3:12 AM  Result Value Ref Range   Troponin I 0.27 (HH) <0.03 ng/mL    Comment: CRITICAL VALUE NOTED. VALUE IS CONSISTENT WITH PREVIOUSLY REPORTED/CALLED VALUE RWW   MRSA PCR Screening     Status: None   Collection Time: 06/13/16  8:18 AM  Result  Value Ref Range   MRSA by PCR NEGATIVE NEGATIVE    Comment:        The GeneXpert MRSA Assay (FDA approved for NASAL specimens only), is one component of a comprehensive MRSA colonization surveillance program. It is not intended to diagnose MRSA infection nor to guide or monitor treatment for MRSA infections.   Troponin I     Status: Abnormal   Collection Time: 06/13/16  8:36 AM  Result Value Ref Range   Troponin I 0.27 (HH) <0.03 ng/mL    Comment: CRITICAL VALUE NOTED. VALUE IS CONSISTENT WITH PREVIOUSLY  REPORTED/CALLED VALUE  SDR   Magnesium     Status: None   Collection Time: 06/13/16  8:36 AM  Result Value Ref Range   Magnesium 1.8 1.7 - 2.4 mg/dL  TSH     Status: None   Collection Time: 06/13/16  8:36 AM  Result Value Ref Range   TSH 1.337 0.350 - 4.500 uIU/mL    Comment: Performed by a 3rd Generation assay with a functional sensitivity of <=0.01 uIU/mL.  Troponin I     Status: Abnormal   Collection Time: 06/13/16  2:20 PM  Result Value Ref Range   Troponin I 0.30 (HH) <0.03 ng/mL    Comment: CRITICAL VALUE NOTED. VALUE IS CONSISTENT WITH PREVIOUSLY REPORTED/CALLED VALUE / [place]  CBC     Status: Abnormal   Collection Time: 06/14/16  4:39 AM  Result Value Ref Range   WBC 13.2 (H) 3.6 - 11.0 K/uL   RBC 2.42 (L) 3.80 - 5.20 MIL/uL   Hemoglobin 7.5 (L) 12.0 - 16.0 g/dL   HCT 22.0 (L) 35.0 - 47.0 %   MCV 90.8 80.0 - 100.0 fL   MCH 30.8 26.0 - 34.0 pg   MCHC 33.9 32.0 - 36.0 g/dL   RDW 14.5 11.5 - 14.5 %   Platelets 264 150 - 440 K/uL  Basic metabolic panel     Status: Abnormal   Collection Time: 06/14/16  4:39 AM  Result Value Ref Range   Sodium 135 135 - 145 mmol/L   Potassium 3.3 (L) 3.5 - 5.1 mmol/L   Chloride 106 101 - 111 mmol/L   CO2 23 22 - 32 mmol/L   Glucose, Bld 125 (H) 65 - 99 mg/dL   BUN 10 6 - 20 mg/dL   Creatinine, Ser 0.82 0.44 - 1.00 mg/dL   Calcium 8.1 (L) 8.9 - 10.3 mg/dL   GFR calc non Af Amer >60 >60 mL/min   GFR calc Af Amer >60 >60 mL/min    Comment: (NOTE) The eGFR has been calculated using the CKD EPI equation. This calculation has not been validated in all clinical situations. eGFR's persistently <60 mL/min signify possible Chronic Kidney Disease.    Anion gap 6 5 - 15  Glucose, capillary     Status: Abnormal   Collection Time: 06/14/16  7:40 AM  Result Value Ref Range   Glucose-Capillary 106 (H) 65 - 99 mg/dL   No components found for: ESR, C REACTIVE PROTEIN MICRO: Recent Results (from the past 720 hour(s))  Blood Culture (routine x 2)      Status: Abnormal   Collection Time: 05/31/16  9:52 PM  Result Value Ref Range Status   Specimen Description BLOOD R FA  Final   Special Requests Blood Culture adequate volume  Final   Culture  Setup Time   Final    GRAM NEGATIVE RODS IN BOTH AEROBIC AND ANAEROBIC BOTTLES CRITICAL RESULT CALLED TO, READ  BACK BY AND VERIFIED WITH: CHRISTY KATSOUDAS 06/01/16 1194 KLW Performed at [facility], [place] [address] [place], [place] [address]    Culture ESCHERICHIA COLI (A)  Final   Report Status 06/03/2016 FINAL  Final   Organism ID, Bacteria ESCHERICHIA COLI  Final      Susceptibility   Escherichia coli - MIC*    AMPICILLIN <=2 SENSITIVE Sensitive     CEFAZOLIN <=4 SENSITIVE Sensitive     CEFEPIME <=1 SENSITIVE Sensitive     CEFTAZIDIME <=1 SENSITIVE Sensitive     CEFTRIAXONE <=1 SENSITIVE Sensitive     CIPROFLOXACIN <=0.25 SENSITIVE Sensitive     GENTAMICIN <=1 SENSITIVE Sensitive     IMIPENEM <=0.25 SENSITIVE Sensitive     TRIMETH/SULFA <=20 SENSITIVE Sensitive     AMPICILLIN/SULBACTAM <=2 SENSITIVE Sensitive     PIP/TAZO <=4 SENSITIVE Sensitive     Extended ESBL NEGATIVE Sensitive     * ESCHERICHIA COLI  Blood Culture (routine x 2)     Status: Abnormal   Collection Time: 05/31/16  9:52 PM  Result Value Ref Range Status   Specimen Description BLOOD L WRIST  Final   Special Requests Blood Culture adequate volume  Final   Culture  Setup Time   Final    GRAM NEGATIVE RODS IN BOTH AEROBIC AND ANAEROBIC BOTTLES CRITICAL RESULT CALLED TO, READ BACK BY AND VERIFIED WITH: CHRISTY KATSOUDAS 06/01/16 0907 KLW    Culture (A)  Final    ESCHERICHIA COLI SUSCEPTIBILITIES PERFORMED ON PREVIOUS CULTURE WITHIN THE LAST 5 DAYS. Performed at [facility], [place] [address], [place], [place] [address]    Report Status 06/03/2016 FINAL  Final  Urine culture     Status: Abnormal   Collection Time: 05/31/16  9:52 PM  Result Value Ref Range Status   Specimen Description URINE, RANDOM   Final   Special Requests NONE  Final   Culture >=100,000 COLONIES/mL ESCHERICHIA COLI (A)  Final   Report Status 06/03/2016 FINAL  Final   Organism ID, Bacteria ESCHERICHIA COLI (A)  Final      Susceptibility   Escherichia coli - MIC*    AMPICILLIN <=2 SENSITIVE Sensitive     CEFAZOLIN <=4 SENSITIVE Sensitive     CEFTRIAXONE <=1 SENSITIVE Sensitive     CIPROFLOXACIN <=0.25 SENSITIVE Sensitive     GENTAMICIN <=1 SENSITIVE Sensitive     IMIPENEM <=0.25 SENSITIVE Sensitive     NITROFURANTOIN <=16 SENSITIVE Sensitive     TRIMETH/SULFA <=20 SENSITIVE Sensitive     AMPICILLIN/SULBACTAM <=2 SENSITIVE Sensitive     PIP/TAZO <=4 SENSITIVE Sensitive     Extended ESBL NEGATIVE Sensitive     * >=100,000 COLONIES/mL ESCHERICHIA COLI  Blood Culture ID Panel (Reflexed)     Status: Abnormal   Collection Time: 05/31/16  9:52 PM  Result Value Ref Range Status   Enterococcus species NOT DETECTED NOT DETECTED Final   Listeria monocytogenes NOT DETECTED NOT DETECTED Final   Staphylococcus species NOT DETECTED NOT DETECTED Final   Staphylococcus aureus NOT DETECTED NOT DETECTED Final   Streptococcus species NOT DETECTED NOT DETECTED Final   Streptococcus agalactiae NOT DETECTED NOT DETECTED Final   Streptococcus pneumoniae NOT DETECTED NOT DETECTED Final   Streptococcus pyogenes NOT DETECTED NOT DETECTED Final   Acinetobacter baumannii NOT DETECTED NOT DETECTED Final   Enterobacteriaceae species DETECTED (A) NOT DETECTED Final    Comment: Enterobacteriaceae represent a large family of gram-negative bacteria, not a single organism. CRITICAL RESULT CALLED TO, READ  BACK BY AND VERIFIED WITH: CHRISTY KATSOUDAS 06/01/16 0907 KLW    Enterobacter cloacae complex NOT DETECTED NOT DETECTED Final   Escherichia coli DETECTED (A) NOT DETECTED Final    Comment: CRITICAL RESULT CALLED TO, READ BACK BY AND VERIFIED WITH: CHRISTY KATSOUDAS 06/01/16 0907 KLW    Klebsiella oxytoca NOT DETECTED NOT DETECTED Final    Klebsiella pneumoniae NOT DETECTED NOT DETECTED Final   Proteus species NOT DETECTED NOT DETECTED Final   Serratia marcescens NOT DETECTED NOT DETECTED Final   Carbapenem resistance NOT DETECTED NOT DETECTED Final   Haemophilus influenzae NOT DETECTED NOT DETECTED Final   Neisseria meningitidis NOT DETECTED NOT DETECTED Final   Pseudomonas aeruginosa NOT DETECTED NOT DETECTED Final   Candida albicans NOT DETECTED NOT DETECTED Final   Candida glabrata NOT DETECTED NOT DETECTED Final   Candida krusei NOT DETECTED NOT DETECTED Final   Candida parapsilosis NOT DETECTED NOT DETECTED Final   Candida tropicalis NOT DETECTED NOT DETECTED Final  MRSA PCR Screening     Status: None   Collection Time: 06/01/16  6:00 AM  Result Value Ref Range Status   MRSA by PCR NEGATIVE NEGATIVE Final    Comment:        The GeneXpert MRSA Assay (FDA approved for NASAL specimens only), is one component of a comprehensive MRSA colonization surveillance program. It is not intended to diagnose MRSA infection nor to guide or monitor treatment for MRSA infections.   Blood Culture (routine x 2)     Status: Abnormal (Preliminary result)   Collection Time: 06/12/16  6:45 PM  Result Value Ref Range Status   Specimen Description BLOOD RFA  Final   Special Requests   Final    BOTTLES DRAWN AEROBIC AND ANAEROBIC Blood Culture adequate volume   Culture  Setup Time   Final    GRAM NEGATIVE RODS IN BOTH AEROBIC AND ANAEROBIC BOTTLES CRITICAL RESULT CALLED TO, READ BACK BY AND VERIFIED WITH: CHRISTINE KATSOUDAS AT 9767 06/13/16 SDR    Culture (A)  Final    ESCHERICHIA COLI SUSCEPTIBILITIES TO FOLLOW Performed at [facility], [place] [address] [place],  [address]    Report Status PENDING  Incomplete  Blood Culture ID Panel (Reflexed)     Status: Abnormal   Collection Time: 06/12/16  6:45 PM  Result Value Ref Range Status   Enterococcus species NOT DETECTED NOT DETECTED Final   Listeria monocytogenes  NOT DETECTED NOT DETECTED Final   Staphylococcus species NOT DETECTED NOT DETECTED Final   Staphylococcus aureus NOT DETECTED NOT DETECTED Final   Streptococcus species NOT DETECTED NOT DETECTED Final   Streptococcus agalactiae NOT DETECTED NOT DETECTED Final   Streptococcus pneumoniae NOT DETECTED NOT DETECTED Final   Streptococcus pyogenes NOT DETECTED NOT DETECTED Final   Acinetobacter baumannii NOT DETECTED NOT DETECTED Final   Enterobacteriaceae species DETECTED (A) NOT DETECTED Final    Comment: Enterobacteriaceae represent a large family of gram-negative bacteria, not a single organism. CRITICAL RESULT CALLED TO, READ BACK BY AND VERIFIED WITH:  CHRISTINE KATSOUDAS AT 7902 06/13/16 SDR    Enterobacter cloacae complex NOT DETECTED NOT DETECTED Final   Escherichia coli DETECTED (A) NOT DETECTED Final    Comment: CRITICAL RESULT CALLED TO, READ BACK BY AND VERIFIED WITH:  [place] AT 4097 06/13/16 SDR    Klebsiella oxytoca NOT DETECTED NOT DETECTED Final   Klebsiella pneumoniae NOT DETECTED NOT DETECTED Final   Proteus species NOT DETECTED NOT DETECTED Final   Serratia marcescens  NOT DETECTED NOT DETECTED Final   Carbapenem resistance NOT DETECTED NOT DETECTED Final   Haemophilus influenzae NOT DETECTED NOT DETECTED Final   Neisseria meningitidis NOT DETECTED NOT DETECTED Final   Pseudomonas aeruginosa NOT DETECTED NOT DETECTED Final   Candida albicans NOT DETECTED NOT DETECTED Final   Candida glabrata NOT DETECTED NOT DETECTED Final   Candida krusei NOT DETECTED NOT DETECTED Final   Candida parapsilosis NOT DETECTED NOT DETECTED Final   Candida tropicalis NOT DETECTED NOT DETECTED Final  Blood Culture (routine x 2)     Status: None (Preliminary result)   Collection Time: 06/12/16  6:47 PM  Result Value Ref Range Status   Specimen Description BLOOD BLOOD LEFT HAND  Final   Special Requests   Final    BOTTLES DRAWN AEROBIC AND ANAEROBIC Blood Culture adequate volume    Culture  Setup Time   Final    GRAM NEGATIVE RODS IN BOTH AEROBIC AND ANAEROBIC BOTTLES CRITICAL VALUE NOTED.  VALUE IS CONSISTENT WITH PREVIOUSLY REPORTED AND CALLED VALUE.    Culture GRAM NEGATIVE RODS  Final   Report Status PENDING  Incomplete  Urine culture     Status: None (Preliminary result)   Collection Time: 06/12/16  7:21 PM  Result Value Ref Range Status   Specimen Description URINE, CATHETERIZED  Final   Special Requests NONE  Final   Culture   Final    CULTURE REINCUBATED FOR BETTER GROWTH Performed at [facility], [address] [place], [place] [address]    Report Status PENDING  Incomplete  MRSA PCR Screening     Status: None   Collection Time: 06/13/16  8:18 AM  Result Value Ref Range Status   MRSA by PCR NEGATIVE NEGATIVE Final    Comment:        The GeneXpert MRSA Assay (FDA approved for NASAL specimens only), is one component of a comprehensive MRSA colonization surveillance program. It is not intended to diagnose MRSA infection nor to guide or monitor treatment for MRSA infections.     IMAGING: Dg Chest Port 1 View  Result Date: 06/12/2016 CLINICAL DATA:  Hypoxia EXAM: PORTABLE CHEST 1 VIEW COMPARISON:  06/01/16 FINDINGS: Cardiac shadow is within normal limits. The lungs are well aerated bilaterally. No focal infiltrate or sizable effusion is seen. No bony abnormality is noted. IMPRESSION: No acute abnormality noted. Electronically Signed   By: Inez Catalina M.D.   On: 06/12/2016 19:16   Dg Chest Port 1 View  Result Date: 06/01/2016 CLINICAL DATA:  83 y/o  F; shortness of breath. EXAM: PORTABLE CHEST 1 VIEW COMPARISON:  05/31/2016 chest radiograph. FINDINGS: Stable cardiac silhouette. Aortic atherosclerosis with calcification. Low lung volumes with minor bibasilar atelectasis. No focal consolidation. No pleural effusion or pneumothorax. Bones are unremarkable. IMPRESSION: Low lung volumes.  No acute pulmonary process identified. Electronically  Signed   By: Kristine Garbe M.D.   On: 06/01/2016 17:01   Dg Chest Port 1 View  Result Date: 05/31/2016 CLINICAL DATA:  Altered mental status EXAM: PORTABLE CHEST 1 VIEW COMPARISON:  05/07/2016 FINDINGS: Minimal basilar atelectasis. No consolidation or effusion. Stable cardiomediastinal silhouette. No pneumothorax. Metallic opacity over the mandible. IMPRESSION: No active disease. Electronically Signed   By: Donavan Foil M.D.   On: 05/31/2016 22:24   Ct Renal Stone Study  Result Date: 06/13/2016 CLINICAL DATA:  83 year old female with recent hospital admission for E coli bacteremia and urinary tract infection, treated with oral antibiotic therapy, presenting with altered mental status  and hypotension. Urosepsis with recurrent urinary tract infection. EXAM: CT ABDOMEN AND PELVIS WITHOUT CONTRAST TECHNIQUE: Multidetector CT imaging of the abdomen and pelvis was performed following the standard protocol without IV contrast. COMPARISON:  CT the abdomen and pelvis 10/19/2015. MRI of the abdomen 12/11/2015. FINDINGS: Lower chest: Calcifications of the aortic valve and severe calcifications of the mitral annulus. Atherosclerotic calcifications in the right coronary artery. Areas of scarring and/or subsegmental atelectasis throughout the visualized lung bases bilaterally. Hepatobiliary: No definite cystic or solid hepatic lesions are identified on today's noncontrast CT examination. Gallbladder is moderately distended. Small amount of amorphous dependent intermediate attenuation material lying in the gallbladder, presumably biliary sludge. No surrounding inflammatory changes to suggest an acute cholecystitis at this time. Pancreas: No definite pancreatic mass or peripancreatic inflammatory changes are noted on today's noncontrast CT examination. Spleen: Unremarkable. Adrenals/Urinary Tract: No calcifications are identified within the collecting system of either kidney, along the course of either ureter,  or within the lumen of the urinary bladder. There is no hydroureteronephrosis to indicate urinary tract obstruction at this time. Low-attenuation lesions in both kidneys are again noted, incompletely characterized on today's noncontrast CT examination, but previously characterized as simple cysts, measuring up to 3.5 cm in the anterior aspect of the interpolar region of the right kidney. Bilateral adrenal glands are normal in appearance. Urinary bladder is grossly unremarkable in appearance. Stomach/Bowel: The unenhanced appearance of the stomach is normal. There is no pathologic dilatation of small bowel or colon. Several colonic diverticulae are noted, without surrounding inflammatory changes to suggest an acute diverticulitis at this time. The appendix is not confidently identified and may be surgically absent. Regardless, there are no inflammatory changes noted adjacent to the cecum to suggest the presence of an acute appendicitis at this time. Vascular/Lymphatic: Aortic atherosclerosis, without evidence of aneurysm in the abdominal or pelvic vasculature. IVC filter in position with tip terminating at the level of the renal veins. No lymphadenopathy noted in the abdomen or pelvis on today's noncontrast CT examination. Reproductive: Status post hysterectomy. Ovaries are not confidently identified may be surgically absent or atrophic. Other: No significant volume of ascites.  No pneumoperitoneum. Musculoskeletal: Chronic compression fracture of T12 is unchanged with approximately 40% loss of anterior vertebral body height. 7 mm of anterolisthesis of L4 upon L5. There are no aggressive appearing lytic or blastic lesions noted in the visualized portions of the skeleton. IMPRESSION: 1. No acute findings are noted in the abdomen or pelvis. Specifically, no urinary tract calculi no findings of urinary tract obstruction. 2. Biliary sludge in the gallbladder, without definite evidence to suggest an acute cholecystitis at  this time. 3. Aortic atherosclerosis, in addition to at least right coronary artery disease. 4. Colonic diverticulosis without definite evidence to suggest an acute diverticulitis at this time. 5. Additional incidental findings, as above. Electronically Signed   By: Vinnie Langton M.D.   On: 06/13/2016 11:24    Assessment:   Hailey [name] is a 83 y.o. female with recurrent E coli bacteremia from urinary source. Recently treated with a 10 day course of IV and oral abx for the E coli bacteremia. However infection recurred shortly after stopping antibiotics. I do not think she failed the initial therapy but instead has chronic urinary retention (had to have foley placed and found 1000 cc urine retained). CT shows no stones or pyelonephritis.  She is clinically improving, likely the E coli will have same sensitivities but would continue meropenem until these are back.  Recommendations Continue  meropenem Once sensitivities available would dc on oral abx therapy for total 14 day course Will likely need intermittent straight cath (or short term foley)  to prevent urinary retention and recurrent UTIs.    I discussed with Dr Erlene Quan who suggested she be dced with foley cath and her office can see her in a week for voiding trial and to teach SIC if needed. Thank you very much for allowing me to participate in the care of this patient. Please call with questions.   Cheral Marker. Ola Spurr, MD

## 2016-06-14 NOTE — Progress Notes (Signed)
PT Cancellation Note  Patient Details Name: Hailey [name] MRN: [id number] DOB: [birth year]   Cancelled Treatment:    Reason Eval/Treat Not Completed: Medical issues which prohibited therapy (Hgb down from 8.6 yesterday to 7.5 today).  Will continue to follow acutely and will see once pt more medically appropriate for PT.  Thank you for this order.    Collie Siad PT, DPT 06/14/2016, 2:01 PM

## 2016-06-14 NOTE — Clinical Social Work Note (Signed)
Clinical Social Work Assessment  Patient Details  Name: TAYLORE HINDE MRN: [id number] Date of Birth: 07-23-1932  Date of referral:  06/14/16               Reason for consult:  Facility Placement                Permission sought to share information with:  Facility Sport and exercise psychologist, Family Supports Permission granted to share information::  Yes, Verbal Permission Granted  Name::     Lysle Rubens Daughter [phone] or Shataya, Winkles [phone]   Agency::  SNF Admissions  Relationship::     Contact Information:     Housing/Transportation Living arrangements for the past 2 months:  [place] of Information:  Adult Children, Patient Patient Interpreter Needed:  None Criminal Activity/Legal Involvement Pertinent to Current Situation/Hospitalization:  No - Comment as needed Significant Relationships:  Adult Children Lives with:  Self Do you feel safe going back to the place where you live?  No Need for family participation in patient care:  Yes (Comment)  Care giving concerns:  Patient does not want to return back to [place] she would like to go to a different SNF.   Social Worker assessment / plan:  Patient is an 83 year old female who has been at [facility] for approximately a week for short term rehab.  Patient is alert and oriented x4 and able to express herself.  Patient and family would like to go to a different SNF to continue with her therapy.  CSW explained to patient and her family that due to her insurance, SNF days will continue where she was after she discharged from [facility].  Patient and family would like CSW to begin bed search to a different SNF.  Patient and family did not express any other questions or concerns.  Employment status:  Retired Nurse, adult PT Recommendations:  [place] / Referral to community resources:  [place]  Patient/Family's  Response to care: Patient and family would like to go to a different SNF, and would prefer [facility] or [facility], but is aware that she may have to go to a different SNF depending on bed availability.  Patient/Family's Understanding of and Emotional Response to Diagnosis, Current Treatment, and Prognosis:  Patient is hopeful that she will be able to return to SNF to continue with her therapy.  Emotional Assessment Appearance:  Appears stated age Attitude/Demeanor/Rapport:    Affect (typically observed):  Appropriate, Stable Orientation:  Oriented to Self, Oriented to Place, Oriented to  Time, Oriented to Situation Alcohol / Substance use:  Not Applicable Psych involvement (Current and /or in the community):  No (Comment)  Discharge Needs  Concerns to be addressed:  Lack of Support Readmission within the last 30 days:  Yes [employer] 06-06-16) Current discharge risk:  None Barriers to Discharge:  Continued Medical Work up, [employer]   Ross Ludwig, [place] 06/14/2016,

## 2016-06-14 NOTE — Clinical Social Work Note (Signed)
CSW spoke with patient and her son, patient has been at [facility], however patient and her son would like to go to a different SNF.  CSW explained, that insurance, will have to be reapproved, patient and family would like CSW to begin bed search for different SNFs in [facility].  Unit CSW to continue to follow patient's progress throughout discharge planning.  Jones Broom. [place], MSW, [place]  06/14/2016

## 2016-06-15 ENCOUNTER — Encounter
Admission: RE | Admit: 2016-06-15 | Discharge: 2016-06-15 | Disposition: A | Discharge status: Home / Self Care | Payer: Medicare HMO | Source: Ambulatory Visit | Attending: Internal Medicine | Admitting: Internal Medicine

## 2016-06-15 LAB — CBC
HCT: 23.1 % — ABNORMAL LOW (ref 35.0–47.0)
HEMOGLOBIN: 7.9 g/dL — AB (ref 12.0–16.0)
MCH: 31 pg (ref 26.0–34.0)
MCHC: 34.4 g/dL (ref 32.0–36.0)
MCV: 90 fL (ref 80.0–100.0)
PLATELETS: 285 10*3/uL (ref 150–440)
RBC: 2.57 MIL/uL — AB (ref 3.80–5.20)
RDW: 14.2 % (ref 11.5–14.5)
WBC: 10.5 10*3/uL (ref 3.6–11.0)

## 2016-06-15 LAB — CULTURE, BLOOD (ROUTINE X 2)
SPECIAL REQUESTS: ADEQUATE
Special Requests: ADEQUATE

## 2016-06-15 LAB — BASIC METABOLIC PANEL
Anion gap: 5 (ref 5–15)
BUN: 5 mg/dL — ABNORMAL LOW (ref 6–20)
CHLORIDE: 108 mmol/L (ref 101–111)
CO2: 23 mmol/L (ref 22–32)
Calcium: 8.4 mg/dL — ABNORMAL LOW (ref 8.9–10.3)
Creatinine, Ser: 0.52 mg/dL (ref 0.44–1.00)
GFR calc Af Amer: >60 mL/min (ref >60)
Glucose, Bld: 110 mg/dL — ABNORMAL HIGH (ref 65–99)
POTASSIUM: 3.4 mmol/L — AB (ref 3.5–5.1)
SODIUM: 136 mmol/L (ref 135–145)

## 2016-06-15 LAB — URINE CULTURE

## 2016-06-15 MED ORDER — LINEZOLID 600 MG/300ML IV SOLN
600.0000 mg | Freq: Two times a day (BID) | INTRAVENOUS | Status: DC
  Administered 2016-06-15 – 2016-06-19 (×10): 600 mg via INTRAVENOUS
  Filled 2016-06-15 (×13): qty 300

## 2016-06-15 MED ORDER — BISACODYL 10 MG RE SUPP
10.0000 mg | Freq: Every day | RECTAL | Status: DC
  Administered 2016-06-15: 10 mg via RECTAL
  Filled 2016-06-15 (×3): qty 1

## 2016-06-15 MED ORDER — AMPICILLIN SODIUM 1 G IJ SOLR
1.0000 g | Freq: Four times a day (QID) | INTRAMUSCULAR | Status: DC
  Administered 2016-06-15 – 2016-06-20 (×19): 1 g via INTRAVENOUS
  Filled 2016-06-15 (×25): qty 1000

## 2016-06-15 NOTE — Progress Notes (Signed)
OT Cancellation Note  Patient Details Name: Hailey [name] MRN: [id number] DOB: 04-May-1932   Cancelled Treatment:    Reason Eval/Treat Not Completed: Other (comment) Attempted to see patient again but unable due to MD in room with her.  Will attempt again tomorrow.  Chrys Racer, OTR/L ascom [phone] 06/15/16, 3:06 PM

## 2016-06-15 NOTE — Progress Notes (Signed)
Dear Doctor: This patient has been identified as a candidate for PICC for the following reason (s): poor veins/poor circulatory system (CHF, COPD, emphysema, diabetes, steroid use, IV drug abuse, etc.) If you agree, please write an order for the indicated device.   Thank you for supporting the early vascular access assessment program. 

## 2016-06-15 NOTE — Progress Notes (Signed)
OT Cancellation Note  Patient Details Name: [name] MRN: [id number] DOB: 10-May-1932   Cancelled Treatment:    Reason Eval/Treat Not Completed: Patient declined, no reason specified.  Order received and chart reviewed.  Pt declined due to being in a lot of pain and just received pain medication per her son Hailey [name] and asked for evaluation to be attempted again later today.    Chrys Racer, OTR/L ascom [phone] 06/15/16, 11:17 AM

## 2016-06-15 NOTE — Progress Notes (Signed)
[place] [facility] [place] NAME: Hailey [name]    MR#:  [id number]  DATE OF BIRTH:  [birth year]  SUBJECTIVE:  CHIEF COMPLAINT:   Chief Complaint  Patient presents with  . Hypotension     Recent admission with Escherichia coli bacteremia and UTI, sent to rehabilitation with oral antibiotic depending on sensitivities which she finished and sent back again with altered mental status and hypotension   Found to have urosepsis again with UTI.   Blood pressure responded to IV fluids, currently on broad-spectrum IV antibiotics.   06/13/16 morning patient had episode of excessive shaking and was noted to have recording of Hailey [name] on cardiac monitoring.   After giving IV Ativan injection patient is more comfortable.    HR is stable now.   Pt was more alert yesterday, today is c/o some pain and confused again.  REVIEW OF SYSTEMS:   Patient is alert but appears to have some dementia and not able to give any review of system to me.  ROS  DRUG ALLERGIES:   Allergies  Allergen Reactions  . Aspirin Other (See Comments)    Reaction:  GI bleeding   . Codeine Nausea And Vomiting  . Compazine [Prochlorperazine] Other (See Comments)    Confusion  . Ketorolac Tromethamine Other (See Comments)    Reaction:  Headache   . Nsaids Other (See Comments)    Reaction:  GI bleeding   . Phenazopyridine Hcl Other (See Comments)    Reaction:  Vision problems   . Macrodantin [Nitrofurantoin Macrocrystal] Rash    VITALS:  Blood pressure (!) 129/46, pulse (!) 58, temperature 98.1 F (36.7 C), temperature source Oral, resp. rate 18, height 5\' 1"  (1.549 m), weight 72.6 kg (160 lb), SpO2 96 %.  PHYSICAL EXAMINATION:    GENERAL:  83 y.o.-year-old patient lying in the bed with no acute distress.  EYES: Pupils equal, round, reactive to light and accommodation. No scleral icterus. Extraocular muscles intact.  HEENT: Head atraumatic, normocephalic. Oropharynx and nasopharynx  clear.  NECK:  Supple, no jugular venous distention. No thyroid enlargement, no tenderness.  LUNGS: Normal breath sounds bilaterally, no wheezing, rales,rhonchi or crepitation. No use of accessory muscles of respiration.  CARDIOVASCULAR: S1, S2 regular. No murmurs, rubs, or gallops.  ABDOMEN: Soft, nontender, nondistended. Bowel sounds present. No organomegaly or mass.  EXTREMITIES: No pedal edema, cyanosis, or clubbing.  NEUROLOGIC: Cranial nerves II through XII are intact. Muscle strength 3-4/5 in all extremities. Sensation and Gait not checked.  PSYCHIATRIC: The patient is alert and not oriented x 1.  SKIN: No obvious rash, lesion, or ulcer.   Physical Exam LABORATORY PANEL:   CBC  Recent Labs Lab 06/15/16 0457  WBC 10.5  HGB 7.9*  HCT 23.1*  PLT 285   ------------------------------------------------------------------------------------------------------------------  Chemistries   Recent Labs Lab 06/12/16 1845  06/13/16 0836  06/15/16 0457  NA 134*  < >  --   < > 136  K 3.8  < >  --   < > 3.4*  CL 99*  < >  --   < > 108  CO2 24  < >  --   < > 23  GLUCOSE 108*  < >  --   < > 110*  BUN 11  < >  --   < > 5*  CREATININE 1.10*  < >  --   < > 0.52  CALCIUM 8.8*  < >  --   < > 8.4*  MG  --   --  1.8  --   --   AST 27  --   --   --   --   ALT 7*  --   --   --   --   ALKPHOS 99  --   --   --   --   BILITOT 0.9  --   --   --   --   < > = values in this interval not displayed. ------------------------------------------------------------------------------------------------------------------  Cardiac Enzymes  Recent Labs Lab 06/13/16 0836 06/13/16 1420  TROPONINI 0.27* 0.30*   ------------------------------------------------------------------------------------------------------------------  RADIOLOGY:  No results found.  ASSESSMENT AND PLAN:   Active Problems:   Altered mental status   Sepsis (Hailey [name])   Palliative care by specialist   Goals of care,  counseling/discussion   Advance care planning  * Sepsis with UTI   Recently treated for Escherichia coli bacteremia and UTI, blood culture is sent in this admission still in process- showing gram negative rods.   Possibly this episode appears to be continuation from the last episode.   She was on appropriate antibiotics at the time of discharge as per the culture results available.   CT renal study is negative.   IV fluids, blood pressure is under control.   IV meropenem now, as bl cx is positive , MRSA screen is negative.   Urine cx reported Hailey [name]- added linezolid as per ID.   Final result on bl cx with pan sensitive E coli- waiting for ID to advise on Abx choice.  * Ventricular tachycardia ruled out.   This was noted on cardiac monitoring, appears to be artifact as this was while the patient had excessive shaking.    Cardiology consult appreciated.    Was found to have artifacts  * Elevated troponin   Most likely this is demand ischemia secondary to sepsis, nontender coagulation at this time, cardiology consult appreciated.  *  History of coronary artery disease   Continue aspirin, brilanta, atorvastatin.  * Parkinson's disease   Have to hold carbidopa and levodopa for now- until on linezolid.   * Hypothyroidism    continue levothyroxine, checked- noraml TSH.  * urinary retention   Foley cath placed- after having > 900 ml residual urine last night.   As per discussion with urology- d/c with foley.  * Anemia   Check Hailey [name]. No BM yet.  All the records are reviewed and case discussed with Care Management/Social Workerr. Management plans discussed with the patient, family and they are in agreement.  CODE STATUS: DNR  TOTAL TIME TAKING CARE OF THIS PATIENT: 35 minutes.   POSSIBLE D/C IN 1-2 DAYS, DEPENDING ON CLINICAL CONDITION. Daughter has concerns about she being discharged too early. Insisting on getting PICC line today.  Hailey [name] M.D on 06/15/2016    Between 7am to 6pm - Pager - [phone]  After 6pm go to [email or web address] - password [email or web address] [place] [facility]  Office  [phone]  CC: Primary care physician; BABAOFF, Caryl Bis, MD  Note: This dictation was prepared with Dragon dictation along with smaller phrase technology. Any transcriptional errors that result from this process are unintentional.

## 2016-06-15 NOTE — Progress Notes (Addendum)
[facility] INFECTIOUS DISE[facility] PROGRESS NOTE Date of Admission:  06/12/2016     ID: Hailey [name] is a 83 y.o. female with Recurrent UTI, E coli bacteremia, VRE UTI Active Problems:   Altered mental status   Sepsis ([place])   Palliative care by specialist   Goals of care, counseling/discussion   Advance care planning  Subjective: No fevers but a little less active today.  Wbc down to 10  ROS  Eleven systems are reviewed and negative except per hpi  Medications:  Antibiotics Given (last 72 hours)    Date/Time Action Medication Dose Rate   06/12/16 1855 New Bag/Given   piperacillin-tazobactam (ZOSYN) IVPB 3.375 g 3.375 g 100 mL/hr   06/12/16 1855 New Bag/Given   vancomycin (VANCOCIN) IVPB 1000 mg/200 mL premix 1,000 mg 200 mL/hr   06/13/16 0415 New Bag/Given   piperacillin-tazobactam (ZOSYN) IVPB 3.375 g 3.375 g 12.5 mL/hr   06/13/16 0425 New Bag/Given   vancomycin (VANCOCIN) IVPB 1000 mg/200 mL premix 1,000 mg 200 mL/hr   06/13/16 1053 New Bag/Given   meropenem (MERREM) IVPB SOLR 1 g 1 g 100 mL/hr   06/13/16 2136 New Bag/Given   meropenem (MERREM) IVPB SOLR 1 g 1 g 100 mL/hr   06/14/16 0830 New Bag/Given   meropenem (MERREM) IVPB SOLR 1 g 1 g 100 mL/hr   06/14/16 2311 New Bag/Given   meropenem (MERREM) IVPB SOLR 1 g 1 g 100 mL/hr   06/15/16 1037 New Bag/Given   linezolid (ZYVOX) IVPB 600 mg 600 mg 300 mL/hr   06/15/16 1228 New Bag/Given   meropenem (MERREM) IVPB SOLR 1 g 1 g 100 mL/hr     . amiodarone  200 mg Oral Daily  . aspirin EC  81 mg Oral Daily  . atorvastatin  80 mg Oral q1800  . bisacodyl  10 mg Rectal Daily  . [START ON 06/16/2016] clopidogrel  75 mg Oral Daily  . docusate sodium  100 mg Oral BID  . enoxaparin (LOVENOX) injection  40 mg Subcutaneous Q24H  . feeding supplement (ENSURE ENLIVE)  237 mL Oral BID BM  . levothyroxine  50 mcg Oral QAC breakfast  . mouth rinse  15 mL Mouth Rinse BID  . mometasone-formoterol  2 puff Inhalation BID  . pantoprazole  40  mg Oral Daily  . potassium chloride  20 mEq Oral BID  . sodium chloride flush  3 mL Intravenous Q12H  . tiotropium  18 mcg Inhalation Daily    Objective: Vital signs in last 24 hours: Temp:  [97.4 F (36.3 C)-98.1 F (36.7 C)] 97.4 F (36.3 C) (05/02 1357) Pulse Rate:  [57-63] 57 (05/02 1357) Resp:  [8-20] 18 (05/02 1357) BP: (114-133)/(37-56) 118/37 (05/02 1357) SpO2:  [96 %-99 %] 99 % (05/02 1357) Constitutional:  oriented to person, place, and time. appears well-developed and well-nourished. No distress. obese HENT: [place]/AT, PERRLA, no scleral icterus Mouth/Throat: Oropharynx is clear and moist. No oropharyngeal exudate.  Cardiovascular: Normal rate, regular rhythm and normal heart sounds.  Pulmonary/Chest: Effort normal and breath sounds normal. No respiratory distress.  has no wheezes.  Neck = supple, no nuchal rigidity Abdominal: Soft. Bowel sounds are normal.  exhibits no distension. There is no tenderness.  Lymphadenopathy: no cervical adenopathy. No axillary adenopathy Neurological: alert and oriented to person, place, and time.  Skin: Skin is warm and dry. No rash noted. No erythema.  Psychiatric: a normal mood and affect.  behavior is normal.  GU foley in place with yellow urine  Lab Results  Recent Labs  06/14/16 0439 06/15/16 0457  WBC 13.2* 10.5  HGB 7.5* 7.9*  HCT 22.0* 23.1*  NA 135 136  K 3.3* 3.4*  CL 106 108  CO2 23 23  BUN 10 5*  CREATININE 0.82 0.52    Microbiology: Results for orders placed or performed during the hospital encounter of 06/12/16  Blood Culture (routine x 2)     Status: Abnormal   Collection Time: 06/12/16  6:45 PM  Result Value Ref Range Status   Specimen Description BLOOD RFA  Final   Special Requests   Final    BOTTLES DRAWN AEROBIC AND ANAEROBIC Blood Culture adequate volume   Culture  Setup Time   Final    GRAM NEGATIVE RODS IN BOTH AEROBIC AND ANAEROBIC BOTTLES CRITICAL RESULT CALLED TO, READ BACK BY AND VERIFIED  WITHCurt [name] AT 8546 06/13/16 SDR Performed at [facility], [place] [address], [place], [place] [address]    Culture ESCHERICHIA COLI (A)  Final   Report Status 06/15/2016 FINAL  Final   Organism ID, Bacteria ESCHERICHIA COLI  Final      Susceptibility   Escherichia coli - MIC*    AMPICILLIN <=2 SENSITIVE Sensitive     CEFAZOLIN <=4 SENSITIVE Sensitive     CEFEPIME <=1 SENSITIVE Sensitive     CEFTAZIDIME <=1 SENSITIVE Sensitive     CEFTRIAXONE <=1 SENSITIVE Sensitive     CIPROFLOXACIN <=0.25 SENSITIVE Sensitive     GENTAMICIN <=1 SENSITIVE Sensitive     IMIPENEM <=0.25 SENSITIVE Sensitive     TRIMETH/SULFA <=20 SENSITIVE Sensitive     AMPICILLIN/SULBACTAM <=2 SENSITIVE Sensitive     PIP/TAZO <=4 SENSITIVE Sensitive     Extended ESBL NEGATIVE Sensitive     * ESCHERICHIA COLI  Blood Culture ID Panel (Reflexed)     Status: Abnormal   Collection Time: 06/12/16  6:45 PM  Result Value Ref Range Status   Enterococcus species NOT DETECTED NOT DETECTED Final   Listeria monocytogenes NOT DETECTED NOT DETECTED Final   Staphylococcus species NOT DETECTED NOT DETECTED Final   Staphylococcus aureus NOT DETECTED NOT DETECTED Final   Streptococcus species NOT DETECTED NOT DETECTED Final   Streptococcus agalactiae NOT DETECTED NOT DETECTED Final   Streptococcus pneumoniae NOT DETECTED NOT DETECTED Final   Streptococcus pyogenes NOT DETECTED NOT DETECTED Final   Acinetobacter baumannii NOT DETECTED NOT DETECTED Final   Enterobacteriaceae species DETECTED (A) NOT DETECTED Final    Comment: Enterobacteriaceae represent a large family of gram-negative bacteria, not a single organism. CRITICAL RESULT CALLED TO, READ BACK BY AND VERIFIED WITH:  CHRISTINE KATSOUDAS AT 0093 06/13/16 SDR    Enterobacter cloacae complex NOT DETECTED NOT DETECTED Final   Escherichia coli DETECTED (A) NOT DETECTED Final    Comment: CRITICAL RESULT CALLED TO, READ BACK BY AND VERIFIED WITH:  [place] AT 8182 06/13/16 SDR    Klebsiella oxytoca NOT DETECTED NOT DETECTED Final   Klebsiella pneumoniae NOT DETECTED NOT DETECTED Final   Proteus species NOT DETECTED NOT DETECTED Final   Serratia marcescens NOT DETECTED NOT DETECTED Final   Carbapenem resistance NOT DETECTED NOT DETECTED Final   Haemophilus influenzae NOT DETECTED NOT DETECTED Final   Neisseria meningitidis NOT DETECTED NOT DETECTED Final   Pseudomonas aeruginosa NOT DETECTED NOT DETECTED Final   Candida albicans NOT DETECTED NOT DETECTED Final   Candida glabrata NOT DETECTED NOT DETECTED Final   Candida krusei NOT DETECTED NOT DETECTED Final   Candida  parapsilosis NOT DETECTED NOT DETECTED Final   Candida tropicalis NOT DETECTED NOT DETECTED Final  Blood Culture (routine x 2)     Status: Abnormal   Collection Time: 06/12/16  6:47 PM  Result Value Ref Range Status   Specimen Description BLOOD BLOOD LEFT HAND  Final   Special Requests   Final    BOTTLES DRAWN AEROBIC AND ANAEROBIC Blood Culture adequate volume   Culture  Setup Time   Final    GRAM NEGATIVE RODS IN BOTH AEROBIC AND ANAEROBIC BOTTLES CRITICAL VALUE NOTED.  VALUE IS CONSISTENT WITH PREVIOUSLY REPORTED AND CALLED VALUE.    Culture (A)  Final    ESCHERICHIA COLI SUSCEPTIBILITIES PERFORMED ON PREVIOUS CULTURE WITHIN THE LAST 5 DAYS. Performed at [facility], [place] [address] [place], [place] [address]    Report Status 06/15/2016 FINAL  Final  Urine culture     Status: Abnormal   Collection Time: 06/12/16  7:21 PM  Result Value Ref Range Status   Specimen Description URINE, CATHETERIZED  Final   Special Requests NONE  Final   Culture (A)  Final    >=100,000 COLONIES/mL VANCOMYCIN RESISTANT ENTEROCOCCUS   Report Status 06/15/2016 FINAL  Final   Organism ID, Bacteria VANCOMYCIN RESISTANT ENTEROCOCCUS (A)  Final      Susceptibility   Vancomycin resistant enterococcus - MIC*    AMPICILLIN >=32 RESISTANT Resistant     LEVOFLOXACIN >=8  RESISTANT Resistant     NITROFURANTOIN 256 RESISTANT Resistant     VANCOMYCIN >=32 RESISTANT Resistant     LINEZOLID 2 SENSITIVE Sensitive     * >=100,000 COLONIES/mL VANCOMYCIN RESISTANT ENTEROCOCCUS  MRSA PCR Screening     Status: None   Collection Time: 06/13/16  8:18 AM  Result Value Ref Range Status   MRSA by PCR NEGATIVE NEGATIVE Final    Comment:        The GeneXpert MRSA Assay (FDA approved for NASAL specimens only), is one component of a comprehensive MRSA colonization surveillance program. It is not intended to diagnose MRSA infection nor to guide or monitor treatment for MRSA infections.     Studies/Results: No results found.  Assessment/Plan: MESA JANUS is a 83 y.o. female with recurrent E coli bacteremia from urinary source. Recently treated with a 10 day course of IV and oral abx for the E coli bacteremia. However infection recurred shortly after stopping antibiotics. I do not think she failed the initial therapy but instead has chronic urinary retention (had to have foley placed and found 1000 cc urine retained). CT shows no stones or pyelonephritis.  She was aclinically improving but then declined some today.. She has VRE in urine as well.  This has in effect not been treated since admit since just starting zyvox today 5/2 Recommendations Would rec cont to monitor for another 2 days to make sure she is responding.  Can change meropenem to ampicillin iv - 14 day course - can change to amoxicillin at time of DC Will need zyvox for a total of 7 days.  Will likely need intermittent straight cath (or short term foley) to prevent urinary retention and recurrent UTIs.    I discussed with Dr Erlene Quan who suggested she be dced with foley cath and her office can see her in a week for voiding trial and to teach SIC if needed.  She Thank you very much for the consult. Will follow with you.  [place], DAVID P   06/15/2016, 2:42 PM

## 2016-06-15 NOTE — Telephone Encounter (Signed)
She is being discharged to rehabilitation*not sure if she will get a letter. Continue try to call her? I'm not sure which facility she is being discharged to but should be documented somewhere in the chart.

## 2016-06-15 NOTE — Telephone Encounter (Signed)
I called and spoke with Ginger on 1-C and gave her the appt so that they would have it before she was discharged. She said she would make sure it was on her discharge paperwork.  Thanks, Sharyn Lull

## 2016-06-15 NOTE — Telephone Encounter (Signed)
Done and mailed app to patient

## 2016-06-15 NOTE — Clinical Social Work Note (Signed)
CSW spoke with pt's daughter, Berdine Addison, to provide bed offers. Pt's daughter choice was [facility] as it is pt's choice. CSW updated facility of choice and to start to auth. CSW also discussed pt's likely co-pays as pt has not had a 60 day wellness period. Pt's daughter would like for Palliative Care to see pt. CSW will follow up with Palliative Care Team to follow up. CSW will continue to follow.   Darden Dates, MSW, LCSW  Clinical Social Worker  [phone]

## 2016-06-16 LAB — OCCULT BLOOD X 1 CARD TO LAB, STOOL: Fecal Occult Bld: NEGATIVE

## 2016-06-16 LAB — GLUCOSE, CAPILLARY: Glucose-Capillary: 116 mg/dL — ABNORMAL HIGH (ref 65–99)

## 2016-06-16 MED ORDER — FERROUS SULFATE 325 (65 FE) MG PO TABS
325.0000 mg | ORAL_TABLET | Freq: Two times a day (BID) | ORAL | Status: DC
  Administered 2016-06-16 – 2016-06-19 (×7): 325 mg via ORAL
  Filled 2016-06-16 (×8): qty 1

## 2016-06-16 NOTE — Progress Notes (Signed)
OT Cancellation Note  Patient Details Name: RASHELLE IRELAND MRN: [id number] DOB: [birth year]   Cancelled Treatment:    Reason Eval/Treat Not Completed: Fatigue/lethargy limiting ability to participate  Pt declined therapy session due to fatigue and not feeling well even after encouragement.  Will continue again tomorrow for OT evaluation.  Chrys Racer, OTR/L ascom [phone] 06/16/16, 12:25 PM

## 2016-06-16 NOTE — Progress Notes (Signed)
PT Cancellation Note  Patient Details Name: Hailey [name] MRN: [id number] DOB: [birth year]   Cancelled Treatment:    Reason Eval/Treat Not Completed: Fatigue/lethargy limiting ability to participate   Attempted x 2 this am.  Pt lethargic both session.  More engaged on second attempt but lethargy remains and she stated she did not feel well.  Assisted pt with fluids per her request but she continued to decline therapy session.  Will continue as appropriate.   Chesley Noon 06/16/2016, 10:49 AM

## 2016-06-16 NOTE — Clinical Social Work Note (Signed)
CSW spoke with pt's daughter. Pt's daughter shared that funds are exteremly limited. CSW provided [employer] and left it at bedside, as well as information on applying for Medicaid. CSW will continue to follow.   Darden Dates, MSW, LCSW Clinical Social Worker  [phone]

## 2016-06-16 NOTE — Progress Notes (Signed)
[facility] [place] NAME: Hailey [name]    MR#:  [id number]  DATE OF BIRTH:  1932/10/20  SUBJECTIVE:  CHIEF COMPLAINT:   Chief Complaint  Patient presents with  . Hypotension     Recent admission with Escherichia coli bacteremia and UTI, sent to rehabilitation with oral antibiotic depending on sensitivities which she finished and sent back again with altered mental status and hypotension   Found to have urosepsis again with UTI.   Blood pressure responded to IV fluids, currently on broad-spectrum IV antibiotics.   06/13/16 morning patient had episode of excessive shaking and was noted to have recording of V. tach on cardiac monitoring.   After giving IV Ativan injection patient is more comfortable.    HR is stable now.   Pt was more alert yesterday, today is c/o some pain and confused again.  REVIEW OF SYSTEMS:   Patient is alert but appears to have some dementia or confusion and not able to give any review of system to me.  ROS  DRUG ALLERGIES:   Allergies  Allergen Reactions  . Aspirin Other (See Comments)    Reaction:  GI bleeding   . Codeine Nausea And Vomiting  . Compazine [Prochlorperazine] Other (See Comments)    Confusion  . Ketorolac Tromethamine Other (See Comments)    Reaction:  Headache   . Nsaids Other (See Comments)    Reaction:  GI bleeding   . Phenazopyridine Hcl Other (See Comments)    Reaction:  Vision problems   . Macrodantin [Nitrofurantoin Macrocrystal] Rash    VITALS:  Blood pressure (!) 134/45, pulse (!) 57, temperature 98.2 F (36.8 C), temperature source Oral, resp. rate 16, height 5\' 1"  (1.549 m), weight 72.6 kg (160 lb), SpO2 97 %.  PHYSICAL EXAMINATION:    GENERAL:  83 y.o.-year-old patient lying in the bed with no acute distress.  EYES: Pupils equal, round, reactive to light and accommodation. No scleral icterus. Extraocular muscles intact.  HEENT: Head atraumatic, normocephalic. Oropharynx and  nasopharynx clear.  NECK:  Supple, no jugular venous distention. No thyroid enlargement, no tenderness.  LUNGS: Normal breath sounds bilaterally, no wheezing, rales,rhonchi or crepitation. No use of accessory muscles of respiration.  CARDIOVASCULAR: S1, S2 regular. No murmurs, rubs, or gallops.  ABDOMEN: Soft, nontender, nondistended. Bowel sounds present. No organomegaly or mass.  EXTREMITIES: No pedal edema, cyanosis, or clubbing.  NEUROLOGIC: Cranial nerves II through XII are intact. Muscle strength 3-4/5 in all extremities. Sensation and Gait not checked.  PSYCHIATRIC: The patient is alert and not oriented x 1 have confusion.  SKIN: No obvious rash, lesion, or ulcer.   Physical Exam LABORATORY PANEL:   CBC  Recent Labs Lab 06/15/16 0457  WBC 10.5  HGB 7.9*  HCT 23.1*  PLT 285   ------------------------------------------------------------------------------------------------------------------  Chemistries   Recent Labs Lab 06/12/16 1845  06/13/16 0836  06/15/16 0457  NA 134*  < >  --   < > 136  K 3.8  < >  --   < > 3.4*  CL 99*  < >  --   < > 108  CO2 24  < >  --   < > 23  GLUCOSE 108*  < >  --   < > 110*  BUN 11  < >  --   < > 5*  CREATININE 1.10*  < >  --   < > 0.52  CALCIUM 8.8*  < >  --   < >  8.4*  MG  --   --  1.8  --   --   AST 27  --   --   --   --   ALT 7*  --   --   --   --   ALKPHOS 99  --   --   --   --   BILITOT 0.9  --   --   --   --   < > = values in this interval not displayed. ------------------------------------------------------------------------------------------------------------------  Cardiac Enzymes  Recent Labs Lab 06/13/16 0836 06/13/16 1420  TROPONINI 0.27* 0.30*   ------------------------------------------------------------------------------------------------------------------  RADIOLOGY:  No results found.  ASSESSMENT AND PLAN:   Active Problems:   Altered mental status   Sepsis ([place])   Palliative care by specialist    Goals of care, counseling/discussion   Advance care planning  * Sepsis with UTI   Recently treated for Escherichia coli bacteremia and UTI, blood culture is sent in this admission still in process- showing gram negative rods.   Possibly this episode appears to be continuation from the last episode.   She was on appropriate antibiotics at the time of discharge as per the culture results available.   CT renal study is negative.   IV fluids, blood pressure is under control.   IV meropenem now, as bl cx is positive , MRSA screen is negative.   Urine cx reported VRE- added linezolid as per ID.   Final result on bl cx with pan sensitive E coli- ID suggest change meropenem to ampicilline.  Still pt have some confusion, so may monitor one more day in hospital.  * Ventricular tachycardia ruled out.   This was noted on cardiac monitoring, appears to be artifact as this was while the patient had excessive shaking.    Cardiology consult appreciated.    Was found to have artifacts  * Elevated troponin   Most likely this is demand ischemia secondary to sepsis, nontender coagulation at this time, cardiology consult appreciated.  *  History of coronary artery disease   Continue aspirin, plavix, atorvastatin.  * Parkinson's disease   Have to hold carbidopa and levodopa for now- until on linezolid.   * Hypothyroidism    continue levothyroxine, checked- noraml TSH.  * urinary retention   Foley cath placed- after having > 900 ml residual urine .   As per discussion with urology- d/c with foley.   Follow in urology clinic after one week from d/c to try removing cath.  * Acute Anemia   Check [place]. Negative.   May have un-noticed GI bleed since last d/c from hospital, as there are several diverticuli noted on CT renal.   Will give oral iron.  All the records are reviewed and case discussed with Care Management/Social Workerr. Management plans discussed with the patient, family and they are in  agreement.  CODE STATUS: DNR  TOTAL TIME TAKING CARE OF THIS PATIENT: 35 minutes.   POSSIBLE D/C IN 1-2 DAYS, DEPENDING ON CLINICAL CONDITION.  Vaughan Basta M.D on 06/16/2016   Between 7am to 6pm - Pager - [phone]  After 6pm go to [email or web address] - password [email or web address] [facility]  Office  [phone]  CC: Primary care physician; BABAOFF, Caryl Bis, MD  Note: This dictation was prepared with Dragon dictation along with smaller phrase technology. Any transcriptional errors that result from this process are unintentional.

## 2016-06-16 NOTE — Progress Notes (Signed)
[facility] INF[facility] PROGRESS NOTE Date of Admission:  06/12/2016     ID: Hailey [name] is a 83 y.o. female with Recurrent UTI, E coli bacteremia, VRE UTI Active Problems:   Altered mental status   Sepsis ([place])   Palliative care by specialist   Goals of care, counseling/discussion   Advance care planning  Subjective: No fevers, but still feels weak per her report.   ROS  Eleven systems are reviewed and negative except per hpi  Medications:  Antibiotics Given (last 72 hours)    Date/Time Action Medication Dose Rate   06/13/16 2136 New Bag/Given   meropenem (MERREM) IVPB SOLR 1 g 1 g 100 mL/hr   06/14/16 0830 New Bag/Given   meropenem (MERREM) IVPB SOLR 1 g 1 g 100 mL/hr   06/14/16 2311 New Bag/Given   meropenem (MERREM) IVPB SOLR 1 g 1 g 100 mL/hr   06/15/16 1037 New Bag/Given   linezolid (ZYVOX) IVPB 600 mg 600 mg 300 mL/hr   06/15/16 1228 New Bag/Given   meropenem (MERREM) IVPB SOLR 1 g 1 g 100 mL/hr   06/15/16 1815 New Bag/Given   ampicillin (OMNIPEN) 1 g in sodium chloride 0.9 % 50 mL IVPB 1 g 150 mL/hr   06/15/16 2200 New Bag/Given   linezolid (ZYVOX) IVPB 600 mg 600 mg 300 mL/hr   06/16/16 0123 New Bag/Given   ampicillin (OMNIPEN) 1 g in sodium chloride 0.9 % 50 mL IVPB 1 g 150 mL/hr   06/16/16 0639 New Bag/Given   ampicillin (OMNIPEN) 1 g in sodium chloride 0.9 % 50 mL IVPB 1 g 150 mL/hr   06/16/16 0912 New Bag/Given   linezolid (ZYVOX) IVPB 600 mg 600 mg 300 mL/hr   06/16/16 1311 New Bag/Given   ampicillin (OMNIPEN) 1 g in sodium chloride 0.9 % 50 mL IVPB 1 g 150 mL/hr     . aspirin EC  81 mg Oral Daily  . atorvastatin  80 mg Oral q1800  . bisacodyl  10 mg Rectal Daily  . clopidogrel  75 mg Oral Daily  . docusate sodium  100 mg Oral BID  . enoxaparin (LOVENOX) injection  40 mg Subcutaneous Q24H  . feeding supplement (ENSURE ENLIVE)  237 mL Oral BID BM  . ferrous sulfate  325 mg Oral BID WC  . levothyroxine  50 mcg Oral QAC breakfast  . mouth  rinse  15 mL Mouth Rinse BID  . mometasone-formoterol  2 puff Inhalation BID  . pantoprazole  40 mg Oral Daily  . potassium chloride  20 mEq Oral BID  . sodium chloride flush  3 mL Intravenous Q12H  . tiotropium  18 mcg Inhalation Daily    Objective: Vital signs in last 24 hours: Temp:  [97.8 F (36.6 C)-98.2 F (36.8 C)] 98.2 F (36.8 C) (05/03 0645) Pulse Rate:  [57] 57 (05/03 0645) Resp:  [16-18] 16 (05/03 0645) BP: (121-134)/(39-45) 134/45 (05/03 0645) SpO2:  [97 %] 97 % (05/03 0645) Constitutional:  oriented to person, place, and time. appears well-developed and well-nourished. No distress. obese HENT: [place]/AT, PERRLA, no scleral icterus Mouth/Throat: Oropharynx is clear and moist. No oropharyngeal exudate.  Cardiovascular: Normal rate, regular rhythm and normal heart sounds.  Pulmonary/Chest: Effort normal and breath sounds normal. No respiratory distress.  has no wheezes.  Neck = supple, no nuchal rigidity Abdominal: Soft. Bowel sounds are normal.  exhibits no distension. There is no tenderness.  Lymphadenopathy: no cervical adenopathy. No axillary adenopathy Neurological: alert and oriented to person,  place, and time.  Skin: Skin is warm and dry. No rash noted. No erythema.  Psychiatric: a normal mood and affect.  behavior is normal.  GU foley in place with yellow urine  Lab Results  Recent Labs  06/14/16 0439 06/15/16 0457  WBC 13.2* 10.5  HGB 7.5* 7.9*  HCT 22.0* 23.1*  NA 135 136  K 3.3* 3.4*  CL 106 108  CO2 23 23  BUN 10 5*  CREATININE 0.82 0.52    Microbiology: Results for orders placed or performed during the hospital encounter of 06/12/16  Blood Culture (routine x 2)     Status: Abnormal   Collection Time: 06/12/16  6:45 PM  Result Value Ref Range Status   Specimen Description BLOOD RFA  Final   Special Requests   Final    BOTTLES DRAWN AEROBIC AND ANAEROBIC Blood Culture adequate volume   Culture  Setup Time   Final    GRAM NEGATIVE RODS IN  BOTH AEROBIC AND ANAEROBIC BOTTLES CRITICAL RESULT CALLED TO, READ BACK BY AND VERIFIED WITHCurt Jews AT [id number] 06/13/16 SDR Performed at [facility], [place] [address], [place], [place] [address]    Culture ESCHERICHIA COLI (A)  Final   Report Status 06/15/2016 FINAL  Final   Organism ID, Bacteria ESCHERICHIA COLI  Final      Susceptibility   Escherichia coli - MIC*    AMPICILLIN <=2 SENSITIVE Sensitive     CEFAZOLIN <=4 SENSITIVE Sensitive     CEFEPIME <=1 SENSITIVE Sensitive     CEFTAZIDIME <=1 SENSITIVE Sensitive     CEFTRIAXONE <=1 SENSITIVE Sensitive     CIPROFLOXACIN <=0.25 SENSITIVE Sensitive     GENTAMICIN <=1 SENSITIVE Sensitive     IMIPENEM <=0.25 SENSITIVE Sensitive     TRIMETH/SULFA <=20 SENSITIVE Sensitive     AMPICILLIN/SULBACTAM <=2 SENSITIVE Sensitive     PIP/TAZO <=4 SENSITIVE Sensitive     Extended ESBL NEGATIVE Sensitive     * ESCHERICHIA COLI  Blood Culture ID Panel (Reflexed)     Status: Abnormal   Collection Time: 06/12/16  6:45 PM  Result Value Ref Range Status   Enterococcus species NOT DETECTED NOT DETECTED Final   Listeria monocytogenes NOT DETECTED NOT DETECTED Final   Staphylococcus species NOT DETECTED NOT DETECTED Final   Staphylococcus aureus NOT DETECTED NOT DETECTED Final   Streptococcus species NOT DETECTED NOT DETECTED Final   Streptococcus agalactiae NOT DETECTED NOT DETECTED Final   Streptococcus pneumoniae NOT DETECTED NOT DETECTED Final   Streptococcus pyogenes NOT DETECTED NOT DETECTED Final   Acinetobacter baumannii NOT DETECTED NOT DETECTED Final   Enterobacteriaceae species DETECTED (A) NOT DETECTED Final    Comment: Enterobacteriaceae represent a large family of gram-negative bacteria, not a single organism. CRITICAL RESULT CALLED TO, READ BACK BY AND VERIFIED WITH:  CHRISTINE KATSOUDAS AT 9983 06/13/16 SDR    Enterobacter cloacae complex NOT DETECTED NOT DETECTED Final   Escherichia coli DETECTED (A) NOT DETECTED Final     Comment: CRITICAL RESULT CALLED TO, READ BACK BY AND VERIFIED WITH:  [place] AT 3825 06/13/16 SDR    Klebsiella oxytoca NOT DETECTED NOT DETECTED Final   Klebsiella pneumoniae NOT DETECTED NOT DETECTED Final   Proteus species NOT DETECTED NOT DETECTED Final   Serratia marcescens NOT DETECTED NOT DETECTED Final   Carbapenem resistance NOT DETECTED NOT DETECTED Final   Haemophilus influenzae NOT DETECTED NOT DETECTED Final   Neisseria meningitidis NOT DETECTED NOT DETECTED Final   Pseudomonas aeruginosa  NOT DETECTED NOT DETECTED Final   Candida albicans NOT DETECTED NOT DETECTED Final   Candida glabrata NOT DETECTED NOT DETECTED Final   Candida krusei NOT DETECTED NOT DETECTED Final   Candida parapsilosis NOT DETECTED NOT DETECTED Final   Candida tropicalis NOT DETECTED NOT DETECTED Final  Blood Culture (routine x 2)     Status: Abnormal   Collection Time: 06/12/16  6:47 PM  Result Value Ref Range Status   Specimen Description BLOOD BLOOD LEFT HAND  Final   Special Requests   Final    BOTTLES DRAWN AEROBIC AND ANAEROBIC Blood Culture adequate volume   Culture  Setup Time   Final    GRAM NEGATIVE RODS IN BOTH AEROBIC AND ANAEROBIC BOTTLES CRITICAL VALUE NOTED.  VALUE IS CONSISTENT WITH PREVIOUSLY REPORTED AND CALLED VALUE.    Culture (A)  Final    ESCHERICHIA COLI SUSCEPTIBILITIES PERFORMED ON PREVIOUS CULTURE WITHIN THE LAST 5 DAYS. Performed at [facility], [place] [address] [place],  [address]    Report Status 06/15/2016 FINAL  Final  Urine culture     Status: Abnormal   Collection Time: 06/12/16  7:21 PM  Result Value Ref Range Status   Specimen Description URINE, CATHETERIZED  Final   Special Requests NONE  Final   Culture (A)  Final    >=100,000 COLONIES/mL VANCOMYCIN RESISTANT ENTEROCOCCUS   Report Status 06/15/2016 FINAL  Final   Organism ID, Bacteria VANCOMYCIN RESISTANT ENTEROCOCCUS (A)  Final      Susceptibility   Vancomycin resistant  enterococcus - MIC*    AMPICILLIN >=32 RESISTANT Resistant     LEVOFLOXACIN >=8 RESISTANT Resistant     NITROFURANTOIN 256 RESISTANT Resistant     VANCOMYCIN >=32 RESISTANT Resistant     LINEZOLID 2 SENSITIVE Sensitive     * >=100,000 COLONIES/mL VANCOMYCIN RESISTANT ENTEROCOCCUS  MRSA PCR Screening     Status: None   Collection Time: 06/13/16  8:18 AM  Result Value Ref Range Status   MRSA by PCR NEGATIVE NEGATIVE Final    Comment:        The GeneXpert MRSA Assay (FDA approved for NASAL specimens only), is one component of a comprehensive MRSA colonization surveillance program. It is not intended to diagnose MRSA infection nor to guide or monitor treatment for MRSA infections.     Studies/Results: No results found.  Assessment/Plan: ANIYHA TATE is a 83 y.o. female with recurrent E coli bacteremia from urinary source. Recently treated with a 10 day course of IV and oral abx for the E coli bacteremia. However infection recurred shortly after stopping antibiotics. I do not think she failed the initial therapy but instead has chronic urinary retention (had to have foley placed and found 1000 cc urine retained). CT shows no stones or pyelonephritis.  She has VRE in urine as well.    Recommendations Cont ampicillin iv - 14 day course - can change to amoxicillin at time of DC - stop date 5/12 Will need zyvox for a total of 7 days. Stop date 5/8  I discussed with Dr Erlene Quan who suggested she be dced with foley cath and her office can see her in a week for voiding trial and to teach SIC if needed.  Thank you very much for the consult. Will follow with you.  [place], [place]   06/16/2016, 2:33 PM

## 2016-06-16 NOTE — Progress Notes (Signed)
Daily Progress Note   Patient Name: Hailey [name]       Date: 06/16/2016 DOB: 17-Aug-1932  Age: 83 y.o. MRN#: [id number] Attending Physician: Vaughan Basta, MD Primary Care Physician: Marcello Fennel, MD Admit Date: 06/12/2016  Reason for Consultation/Follow-up: Establishing goals of care  Subjective: Patient more lethargic today. Not eating. Now being treated for VRE in urine as well. Tells me she hurts all over. Oriented to person and place. Son at bedside. GOC continue to be to treat infection with hope of discharge to [facility] with palliative f/u.   ROS  Length of Stay: 4  Current Medications: Scheduled Meds:  . aspirin EC  81 mg Oral Daily  . atorvastatin  80 mg Oral q1800  . bisacodyl  10 mg Rectal Daily  . clopidogrel  75 mg Oral Daily  . docusate sodium  100 mg Oral BID  . enoxaparin (LOVENOX) injection  40 mg Subcutaneous Q24H  . feeding supplement (ENSURE ENLIVE)  237 mL Oral BID BM  . ferrous sulfate  325 mg Oral BID WC  . levothyroxine  50 mcg Oral QAC breakfast  . mouth rinse  15 mL Mouth Rinse BID  . mometasone-formoterol  2 puff Inhalation BID  . pantoprazole  40 mg Oral Daily  . potassium chloride  20 mEq Oral BID  . sodium chloride flush  3 mL Intravenous Q12H  . tiotropium  18 mcg Inhalation Daily    Continuous Infusions: . ampicillin (OMNIPEN) IV Stopped (06/16/16 1331)  . linezolid (ZYVOX) IV Stopped (06/16/16 1012)    PRN Meds: acetaminophen **OR** acetaminophen, albuterol, albuterol, fluticasone, LORazepam, morphine injection, ondansetron **OR** ondansetron (ZOFRAN) IV  Physical Exam          Vital Signs: BP (!) 134/45 (BP Location: Left Arm)   Pulse (!) 57   Temp 98.2 F (36.8 C) (Oral)   Resp 16   Ht 5\' 1"  (1.549 m)   Wt 72.6 kg (160 lb)    SpO2 97%   BMI 30.23 kg/m  SpO2: SpO2: 97 % O2 Device: O2 Device: Nasal Cannula O2 Flow Rate: O2 Flow Rate (L/min): 3 L/min  Intake/output summary:  Intake/Output Summary (Last 24 hours) at 06/16/16 1436 Last data filed at 06/16/16 4403  Gross per 24 hour  Intake  539 ml  Output              900 ml  Net             -361 ml   LBM: Last BM Date: 06/16/16 Baseline Weight: Weight: 72.6 kg (160 lb) Most recent weight: Weight: 72.6 kg (160 lb)       Palliative Assessment/Data:    Flowsheet Rows     Most Recent Value  Intake Tab  Referral Department  Hospitalist  Unit at Time of Referral  Oncology Unit  Palliative Care Primary Diagnosis  Sepsis/Infectious Disease  Date Notified  06/13/16  Palliative Care Type  New Palliative care  Reason for referral  Clarify Goals of Care  Date of Admission  06/12/16  Date first seen by Palliative Care  06/13/16  # of days Palliative referral response time  0 Day(s)  # of days IP prior to Palliative referral  1  Clinical Assessment  Psychosocial & Spiritual Assessment  Palliative Care Outcomes      Patient Active Problem List   Diagnosis Date Noted  . Sepsis ([place]) 06/13/2016  . Palliative care by specialist   . Goals of care, counseling/discussion   . Advance care planning   . Altered mental status 06/12/2016  . Sepsis secondary to UTI ([place]) 06/01/2016  . Acute ST elevation myocardial infarction (STEMI) involving left anterior descending (LAD) coronary artery ([place]) 05/01/2016  . STEMI (ST elevation myocardial infarction) ([place]) 05/01/2016  . Protein-calorie malnutrition, severe 10/23/2015  . Uncontrollable vomiting   . Nausea and vomiting 10/22/2015  . Tremor 07/07/2015  . Respiratory failure ([place]) 05/19/2015  . Weakness 01/27/2015  . Abdomen enlarged 09/26/2013  . CTCL (cutaneous T-cell lymphoma) ([place]) 09/18/2013  . Anxiety 08/09/2013  . CCF (congestive cardiac failure) ([place]) 08/09/2013  . Chronic pain 08/09/2013   . Chronic diastolic CHF (congestive heart failure) ([place]) 07/12/2013  . Benign essential HTN 07/01/2013  . Bilateral leg edema 06/06/2013  . PAF (paroxysmal atrial fibrillation) ([place]) 04/25/2013  . DOE (dyspnea on exertion) 10/11/2011  . COPD (chronic obstructive pulmonary disease) ([place]) 10/09/2011  . Cognitive decline 10/09/2011  . Behavior concern 08/23/2011  . Chest pain 10/26/2010  . Palpitations 10/26/2010  . PHLEBITIS AND THROMBOPHLEBITIS OF FEMORAL VEIN 10/13/2008  . DEEP VENOUS THROMBOPHLEBITIS, LEG, LEFT 10/09/2008  . GASTRIC ULCER, ACUTE 10/09/2008  . PERSONAL HX COLONIC POLYPS 09/02/2008  . FATIGUE 08/27/2008  . HOCM (hypertrophic obstructive cardiomyopathy) ([place]) 11/06/2007  . HYPOTENSION, ORTHOSTATIC 11/06/2007  . ADENOMATOUS COLONIC POLYP 04/12/2007  . HIATAL HERNIA 04/12/2007  . Diverticulosis of colon (without mention of hemorrhage) 04/12/2007  . Hyperlipidemia 02/03/2007  . ANEMIA, CHRONIC 02/03/2007  . MIGRAINE HEADACHE 02/03/2007  . Essential hypertension 02/03/2007  . CEREBRAL ANEURYSM 02/03/2007  . GASTROESOPHAGEAL REFLUX DISEASE 02/03/2007  . OSTEOARTHRITIS 02/03/2007  . FIBROMYALGIA 02/03/2007  . CHRONIC FATIGUE SYNDROME 02/03/2007  . MITRAL VALVE PROLAPSE, HX OF 02/03/2007  . PULMONARY EMBOLISM, HX OF 02/03/2007  . TOTAL KNEE REPLACEMENT, LEFT, HX OF 02/03/2007  . HYSTERECTOMY, HX OF 02/03/2007  . Other acquired absence of organ 02/03/2007  . INGUINAL HERNIORRHAPHY, RIGHT, HX OF 02/03/2007    Palliative Care Assessment & Plan   Patient Profile:  83 y.o. female  with past medical history of Parkinson's, STEMI in March (S/P stenting of LAD), afib, anxiety, cerebral aneurysm, CHF, COPD, fibromyalgia, gout, hypertrophic cardiomyopathy and UTI E.Coli sepsis (d/c'd 4/20 to SNF) admitted on 06/12/2016 from SNF with increasing somnolence, fever (reportedly max 103), found to be  hypotensive and hypoxic in the ED. Workup revealed recurrent sepsis likely from UTI,  and elevated troponin (likely demand ischemia). Palliative medicine consulted for [place].  Assessment/Recommendations/Plan  - I am concerned about her decline over the last few days. Will continue to monitor. I suspect she will declare her trajectory over the next day or two.  -PMT will f/u tomorrow.  Code Status:  DNR  Prognosis:   Unable to determine  Discharge Planning:  To Be Determined  Care plan was discussed with patient's son, Gershon Mussel.  Thank you for allowing the Palliative Medicine Team to assist in the care of this patient.   Time In: 1400 Time Out: 1425 Total Time 25 minutes Prolonged Time Billed No      Greater than 50%  of this time was spent counseling and coordinating care related to the above assessment and plan.  Mariana Kaufman, AGNP-C Palliative Medicine   Please contact Palliative Medicine Team phone at [phone] for questions and concerns.

## 2016-06-17 DIAGNOSIS — R112 Nausea with vomiting, unspecified: Secondary | ICD-10-CM

## 2016-06-17 LAB — GLUCOSE, CAPILLARY
GLUCOSE-CAPILLARY: 141 mg/dL — AB (ref 65–99)
Glucose-Capillary: 142 mg/dL — ABNORMAL HIGH (ref 65–99)

## 2016-06-17 LAB — BASIC METABOLIC PANEL
ANION GAP: 6 (ref 5–15)
CALCIUM: 8.7 mg/dL — AB (ref 8.9–10.3)
CO2: 25 mmol/L (ref 22–32)
Chloride: 104 mmol/L (ref 101–111)
Creatinine, Ser: 0.51 mg/dL (ref 0.44–1.00)
GFR calc Af Amer: >60 mL/min (ref >60)
GFR calc non Af Amer: >60 mL/min (ref >60)
GLUCOSE: 128 mg/dL — AB (ref 65–99)
POTASSIUM: 4 mmol/L (ref 3.5–5.1)
Sodium: 135 mmol/L (ref 135–145)

## 2016-06-17 LAB — CBC
HEMATOCRIT: 26.2 % — AB (ref 35.0–47.0)
Hemoglobin: 9 g/dL — ABNORMAL LOW (ref 12.0–16.0)
MCH: 30.5 pg (ref 26.0–34.0)
MCHC: 34.4 g/dL (ref 32.0–36.0)
MCV: 88.7 fL (ref 80.0–100.0)
Platelets: 315 10*3/uL (ref 150–440)
RBC: 2.95 MIL/uL — ABNORMAL LOW (ref 3.80–5.20)
RDW: 14.2 % (ref 11.5–14.5)
WBC: 12.7 10*3/uL — AB (ref 3.6–11.0)

## 2016-06-17 MED ORDER — DULOXETINE HCL 30 MG PO CPEP
60.0000 mg | ORAL_CAPSULE | Freq: Every day | ORAL | Status: DC
  Administered 2016-06-17 – 2016-06-19 (×3): 60 mg via ORAL
  Filled 2016-06-17 (×4): qty 2

## 2016-06-17 MED ORDER — LORAZEPAM 0.5 MG PO TABS
0.2500 mg | ORAL_TABLET | ORAL | Status: DC | PRN
  Administered 2016-06-17: 0.25 mg via SUBLINGUAL
  Filled 2016-06-17: qty 1

## 2016-06-17 MED ORDER — METOCLOPRAMIDE HCL 5 MG/ML IJ SOLN
5.0000 mg | Freq: Four times a day (QID) | INTRAMUSCULAR | Status: DC | PRN
  Administered 2016-06-17: 08:00:00 5 mg via INTRAVENOUS
  Filled 2016-06-17: qty 2

## 2016-06-17 NOTE — Care Management Important Message (Signed)
Important Message  Patient Details  Name: Hailey [name] MRN: [id number] Date of Birth: [birth year]   Medicare Important Message Given:  Yes    Marshell Garfinkel, RN 06/17/2016, 12:03 PM

## 2016-06-17 NOTE — Clinical Social Work Note (Signed)
CSW spoke with pt's daughter as well as Palliative Care NP regarding pt's dispo. The plan is still for pt to go to [facility] when stable, however all dispo options were discussed such as returning home with hospice or going to facility with hospice. Per pt's daughter, there is no funding for any ability to pay privately at facility. CSW provided supportive counseling to pt's daughter. CSW will continue to follow.   Darden Dates, MSW, LCSW  Clinical Social Worker  [phone]

## 2016-06-17 NOTE — Progress Notes (Signed)
Nutrition Follow-up  DOCUMENTATION CODES:   Severe malnutrition in context of chronic illness, Obesity unspecified  INTERVENTION:  Continue Ensure Enlive po BID, each supplement provides 350 kcal and 20 grams of protein.   Continue 1:1 assistance at meals.  Will monitor outcome of discussions regarding goals of care.  NUTRITION DIAGNOSIS:   Malnutrition (Severe) related to chronic illness (chronic N/V for 6 months) as evidenced by percent weight loss, moderate depletion of body fat, severe depletion of body fat, moderate depletions of muscle mass, severe depletion of muscle mass.  Ongoing.  GOAL:   Patient will meet greater than or equal to 90% of their needs  Not met.  MONITOR:   PO intake, Supplement acceptance, Labs, Weight trends, I & O's  REASON FOR ASSESSMENT:   Malnutrition Screening Tool    ASSESSMENT:   83 year old female with PMHx of DVT, HLD, GERD, hypertrophic cardiomyopathy, COPD, PUD, PAF, OSA, anxiety, CHF, presented with low blood pressure and confusion found to have sepsis due to UTI, ventricular tachycardia.  -PMT still following patient.   Discussed with RN. Patient remains lethargic. She is more lethargic now than on RD initial assessment. RN reports patient does not want any Ensure and only taking bites of meals. Son fed patient a few bites of lunch today. She continues to have nausea. Patient required nausea medication before being able to take morning medications today. Discussed possibility of Magic Cup, but she has not been interested in ice cream on trays, either.  Medications reviewed and include: Colace (not taken today), ferrous sulfate 325 mg BID, levothyroxine, pantoprazole, Ativan PRN, Reglan PRN, Zofran PRN.  Labs reviewed: CBG 116-142, BUN <5.   Diet Order:  Diet regular Room service appropriate? Yes; Fluid consistency: Thin  Skin:  Reviewed, no issues  Last BM:  06/17/2016 - type 6 per chart  Height:   Ht Readings from Last 1  Encounters:  06/12/16 5' 1" (1.549 m)    Weight:   Wt Readings from Last 1 Encounters:  06/12/16 160 lb (72.6 kg)    Ideal Body Weight:  47.7 kg  BMI:  Body mass index is 30.23 kg/m.  Estimated Nutritional Needs:   Kcal:  1360-1590 (MSJ x 1.2-1.4)  Protein:  75-85 grams (1-1.2 grams/kg)  Fluid:  1.4-1.6 L/day  EDUCATION NEEDS:   No education needs identified at this time  Willey Blade, MS, RD, LDN Pager: [phone] After Hours Pager: [phone]

## 2016-06-17 NOTE — Progress Notes (Signed)
OT Cancellation Note  Patient Details Name: Hailey [name] MRN: [id number] DOB: 1932-12-06   Cancelled Treatment:     Reattempted OT evaluation this date however, could not arouse patient enough for active participation.   Will continue attempts. Amy T Lovett, OTR/L, CLT   Lovett,Amy 06/17/2016, 1:37 PM

## 2016-06-17 NOTE — Progress Notes (Signed)
[name] NAME: Hailey [name]    MR#:  [id number]  DATE OF BIRTH:  1932/12/23  SUBJECTIVE:  CHIEF COMPLAINT:   Chief Complaint  Patient presents with  . Hypotension     Recent admission with Escherichia coli bacteremia and UTI, sent to rehabilitation with oral antibiotic depending on sensitivities which she finished and sent back again with altered mental status and hypotension   Found to have urosepsis again with UTI.   Blood pressure responded to IV fluids, currently on broad-spectrum IV antibiotics.   06/13/16 morning patient had episode of excessive shaking and was noted to have recording of V. tach on cardiac monitoring.   After giving IV Ativan injection patient is more comfortable.    HR is stable now.   Pt is found to have VRE, and remains altered mentally.   She also have c/o nausea - as per daughter this Is chronic for her.  REVIEW OF SYSTEMS:   Patient is alert but appears to have some dementia or confusion and not able to give any review of system to me.  ROS  DRUG ALLERGIES:   Allergies  Allergen Reactions  . Aspirin Other (See Comments)    Reaction:  GI bleeding   . Codeine Nausea And Vomiting  . Compazine [Prochlorperazine] Other (See Comments)    Confusion  . Ketorolac Tromethamine Other (See Comments)    Reaction:  Headache   . Nsaids Other (See Comments)    Reaction:  GI bleeding   . Phenazopyridine Hcl Other (See Comments)    Reaction:  Vision problems   . Macrodantin [Nitrofurantoin Macrocrystal] Rash    VITALS:  Blood pressure (!) 156/56, pulse 66, temperature 98.3 F (36.8 C), temperature source Oral, resp. rate 16, height 5\' 1"  (1.549 m), weight 72.6 kg (160 lb), SpO2 100 %.  PHYSICAL EXAMINATION:    GENERAL:  83 y.o.-year-old patient lying in the bed with no acute distress.  EYES: Pupils equal, round, reactive to light and accommodation. No scleral icterus. Extraocular muscles intact.  HEENT:  Head atraumatic, normocephalic. Oropharynx and nasopharynx clear.  NECK:  Supple, no jugular venous distention. No thyroid enlargement, no tenderness.  LUNGS: Normal breath sounds bilaterally, no wheezing, rales,rhonchi or crepitation. No use of accessory muscles of respiration.  CARDIOVASCULAR: S1, S2 regular. No murmurs, rubs, or gallops.  ABDOMEN: Soft, nontender, nondistended. Bowel sounds present. No organomegaly or mass.  EXTREMITIES: No pedal edema, cyanosis, or clubbing.  NEUROLOGIC: Cranial nerves II through XII are intact. Muscle strength 3-4/5 in all extremities. Sensation and Gait not checked.  PSYCHIATRIC: The patient is alert and not oriented x 1 have confusion.  SKIN: No obvious rash, lesion, or ulcer.   Physical Exam LABORATORY PANEL:   CBC  Recent Labs Lab 06/17/16 0528  WBC 12.7*  HGB 9.0*  HCT 26.2*  PLT 315   ------------------------------------------------------------------------------------------------------------------  Chemistries   Recent Labs Lab 06/12/16 1845  06/13/16 0836  06/17/16 0528  NA 134*  < >  --   < > 135  K 3.8  < >  --   < > 4.0  CL 99*  < >  --   < > 104  CO2 24  < >  --   < > 25  GLUCOSE 108*  < >  --   < > 128*  BUN 11  < >  --   < > <5*  CREATININE 1.10*  < >  --   < >  0.51  CALCIUM 8.8*  < >  --   < > 8.7*  MG  --   --  1.8  --   --   AST 27  --   --   --   --   ALT 7*  --   --   --   --   ALKPHOS 99  --   --   --   --   BILITOT 0.9  --   --   --   --   < > = values in this interval not displayed. ------------------------------------------------------------------------------------------------------------------  Cardiac Enzymes  Recent Labs Lab 06/13/16 0836 06/13/16 1420  TROPONINI 0.27* 0.30*   ------------------------------------------------------------------------------------------------------------------  RADIOLOGY:  No results found.  ASSESSMENT AND PLAN:   Active Problems:   Altered mental status    Sepsis ([place])   Palliative care by specialist   Goals of care, counseling/discussion   Advance care planning  * Sepsis with UTI   Recently treated for Escherichia coli bacteremia and UTI, blood culture is sent in this admission still in process- showing gram negative rods.   Possibly this episode appears to be continuation from the last episode.   She was on appropriate antibiotics at the time of discharge as per the culture results available.   CT renal study is negative.   IV fluids, blood pressure is under control.   IV meropenem now, as bl cx is positive , MRSA screen is negative.   Urine cx reported VRE- added linezolid as per ID.- for total 7 days.   Final result on bl cx with pan sensitive E coli- ID suggest change meropenem to ampicilline. May switch to oral amoxicilline - stop date 06/25/16  Still pt have some confusion, so may monitor one more day in hospital.  * Ventricular tachycardia ruled out.   This was noted on cardiac monitoring, appears to be artifact as this was while the patient had excessive shaking.    Cardiology consult appreciated.    Was found to have artifacts  pt was on amiodarone, stopped due to bradycardia.  * Elevated troponin   Most likely this is demand ischemia secondary to sepsis, nontender coagulation at this time, cardiology consult appreciated.  *  History of coronary artery disease   Continue aspirin, plavix, atorvastatin.  * Parkinson's disease   Have to hold carbidopa and levodopa for now- until on linezolid.   * Hypothyroidism    continue levothyroxine, checked- noraml TSH.  * urinary retention   Foley cath placed- after having > 900 ml residual urine .   As per discussion with urology- d/c with foley.   Follow in urology clinic after one week from d/c to try removing cath.  * Acute Anemia   Check Guiac. Negative.   May have un-noticed GI bleed since last d/c from hospital, as there are several diverticuli noted on CT renal.   Will give  oral iron.  All the records are reviewed and case discussed with Care Management/Social Workerr. Management plans discussed with the patient, family and they are in agreement.  CODE STATUS: DNR  TOTAL TIME TAKING CARE OF THIS PATIENT: 35 minutes.   POSSIBLE D/C IN 1-2 DAYS, DEPENDING ON CLINICAL CONDITION.  Vaughan Basta M.D on 06/17/2016   Between 7am to 6pm - Pager - [phone]  After 6pm go to [email or web address] - password [email or web address] [facility]  Office  [phone]  CC: Primary care physician; BABAOFF, Caryl Bis, MD  Note: This dictation was prepared  with Dragon dictation along with smaller phrase technology. Any transcriptional errors that result from this process are unintentional.

## 2016-06-17 NOTE — Progress Notes (Addendum)
Daily Progress Note   Patient Name: Hailey [name]       Date: 06/17/2016 DOB: 1932-06-16  Age: 83 y.o. MRN#: [id number] Attending Physician: Hailey Basta, MD Primary Care Physician: Hailey Fennel, MD Admit Date: 06/12/2016  Reason for Consultation/Follow-up: Establishing goals of care  Subjective: Continues to be lethargic today. Daughter at bedside. Severe nausea this morning- daughter says this is a chronic problem. Did not eat yesterday or today. However, she is oriented today. Noted also, her hgb has increased.  Discussed patient's status with Hailey [name]. I am concerned at her continued decline as is Hailey [name], however, I do not think Hailey [name] has declared herself just yet. Hailey [name] would like her to remain in hospital for another day and if she does not appear to improve, Hailey [name] is aware that Hailey [name] may be approaching end of life. Hailey [name] is very realistic and open to comfort measures should Hailey [name] continue to decline and not show improvement with continued antibiotic therapy. We also discussed the face that Hailey [name] is severely deconditioned at this point and her recovery is going to be difficult and prolonged. Each day in the hospital requires several days of recovery. Hailey [name] is working with social work on placement issues. Hospice is also an option. .  Review of Systems  Unable to perform ROS: Acuity of condition    Length of Stay: 5  Current Medications: Scheduled Meds:  . aspirin EC  81 mg Oral Daily  . atorvastatin  80 mg Oral q1800  . bisacodyl  10 mg Rectal Daily  . clopidogrel  75 mg Oral Daily  . docusate sodium  100 mg Oral BID  . DULoxetine  60 mg Oral Daily  . enoxaparin (LOVENOX) injection  40 mg Subcutaneous Q24H  . feeding supplement (ENSURE ENLIVE)  237 mL Oral BID BM    . ferrous sulfate  325 mg Oral BID WC  . levothyroxine  50 mcg Oral QAC breakfast  . mouth rinse  15 mL Mouth Rinse BID  . mometasone-formoterol  2 puff Inhalation BID  . pantoprazole  40 mg Oral Daily  . potassium chloride  20 mEq Oral BID  . sodium chloride flush  3 mL Intravenous Q12H  . tiotropium  18 mcg Inhalation Daily    Continuous Infusions: . ampicillin (OMNIPEN) IV Stopped (06/17/16  1124)  . linezolid (ZYVOX) IV Stopped (06/17/16 1057)    PRN Meds: acetaminophen **OR** acetaminophen, albuterol, albuterol, fluticasone, LORazepam, LORazepam, metoCLOPramide (REGLAN) injection, morphine injection, ondansetron **OR** ondansetron (ZOFRAN) IV  Physical Exam  Constitutional: She is oriented to person, place, and time. She appears well-developed and well-nourished.  Pulmonary/Chest: Effort normal.  Neurological: She is oriented to person, place, and time.  Skin: There is pallor.  Nursing note and vitals reviewed.           Vital Signs: BP (!) 156/56 (BP Location: Left Arm)   Pulse 66   Temp 98.3 F (36.8 C) (Oral)   Resp 16   Ht 5\' 1"  (1.549 m)   Wt 72.6 kg (160 lb)   SpO2 100%   BMI 30.23 kg/m  SpO2: SpO2: 100 % O2 Device: O2 Device: Nasal Cannula O2 Flow Rate: O2 Flow Rate (L/min): 2 L/min  Intake/output summary:   Intake/Output Summary (Last 24 hours) at 06/17/16 1626 Last data filed at 06/17/16 1305  Gross per 24 hour  Intake                0 ml  Output             1800 ml  Net            -1800 ml   LBM: Last BM Date: 06/17/16 Baseline Weight: Weight: 72.6 kg (160 lb) Most recent weight: Weight: 72.6 kg (160 lb)       Palliative Assessment/Data: PPS: 10%    Flowsheet Rows     Most Recent Value  Intake Tab  Referral Department  Hospitalist  Unit at Time of Referral  Oncology Unit  Palliative Care Primary Diagnosis  Sepsis/Infectious Disease  Date Notified  06/13/16  Palliative Care Type  New Palliative care  Reason for referral  Clarify Goals  of Care  Date of Admission  06/12/16  Date first seen by Palliative Care  06/13/16  # of days Palliative referral response time  0 Day(s)  # of days IP prior to Palliative referral  1  Clinical Assessment  Psychosocial & Spiritual Assessment  Palliative Care Outcomes      Patient Active Problem List   Diagnosis Date Noted  . Sepsis ([place]) 06/13/2016  . Palliative care by specialist   . Goals of care, counseling/discussion   . Advance care planning   . Altered mental status 06/12/2016  . Sepsis secondary to UTI ([place]) 06/01/2016  . Acute ST elevation myocardial infarction (STEMI) involving left anterior descending (LAD) coronary artery ([place]) 05/01/2016  . STEMI (ST elevation myocardial infarction) ([place]) 05/01/2016  . Protein-calorie malnutrition, severe 10/23/2015  . Uncontrollable vomiting   . Nausea and vomiting 10/22/2015  . Tremor 07/07/2015  . Respiratory failure ([place]) 05/19/2015  . Weakness 01/27/2015  . Abdomen enlarged 09/26/2013  . CTCL (cutaneous T-cell lymphoma) ([name]) 09/18/2013  . Anxiety 08/09/2013  . CCF (congestive cardiac failure) ([place]) 08/09/2013  . Chronic pain 08/09/2013  . Chronic diastolic CHF (congestive heart failure) ([place]) 07/12/2013  . Benign essential HTN 07/01/2013  . Bilateral leg edema 06/06/2013  . PAF (paroxysmal atrial fibrillation) ([place]) 04/25/2013  . DOE (dyspnea on exertion) 10/11/2011  . COPD (chronic obstructive pulmonary disease) ([place]) 10/09/2011  . Cognitive decline 10/09/2011  . Behavior concern 08/23/2011  . Chest pain 10/26/2010  . Palpitations 10/26/2010  . PHLEBITIS AND THROMBOPHLEBITIS OF FEMORAL VEIN 10/13/2008  . DEEP VENOUS THROMBOPHLEBITIS, LEG, LEFT 10/09/2008  . GASTRIC ULCER, ACUTE 10/09/2008  . PERSONAL HX  COLONIC POLYPS 09/02/2008  . FATIGUE 08/27/2008  . HOCM (hypertrophic obstructive cardiomyopathy) ([place]) 11/06/2007  . HYPOTENSION, ORTHOSTATIC 11/06/2007  . ADENOMATOUS COLONIC POLYP 04/12/2007  . HIATAL HERNIA  04/12/2007  . Diverticulosis of colon (without mention of hemorrhage) 04/12/2007  . Hyperlipidemia 02/03/2007  . ANEMIA, CHRONIC 02/03/2007  . MIGRAINE HEADACHE 02/03/2007  . Essential hypertension 02/03/2007  . CEREBRAL ANEURYSM 02/03/2007  . GASTROESOPHAGEAL REFLUX DISEASE 02/03/2007  . OSTEOARTHRITIS 02/03/2007  . FIBROMYALGIA 02/03/2007  . CHRONIC FATIGUE SYNDROME 02/03/2007  . MITRAL VALVE PROLAPSE, HX OF 02/03/2007  . PULMONARY EMBOLISM, HX OF 02/03/2007  . TOTAL KNEE REPLACEMENT, LEFT, HX OF 02/03/2007  . HYSTERECTOMY, HX OF 02/03/2007  . Other acquired absence of organ 02/03/2007  . INGUINAL HERNIORRHAPHY, RIGHT, HX OF 02/03/2007    Palliative Care Assessment & Plan   Patient Profile:  83 y.o. female  with past medical history of Parkinson's, STEMI in March (S/P stenting of LAD), afib, anxiety, cerebral aneurysm, CHF, COPD, fibromyalgia, gout, hypertrophic cardiomyopathy and UTI E.Coli sepsis (d/c'd 4/20 to SNF) admitted on 06/12/2016 from SNF with increasing somnolence, fever (reportedly max 103), found to be hypotensive and hypoxic in the ED. Workup revealed recurrent sepsis likely from UTI, and elevated troponin (likely demand ischemia). Palliative medicine consulted for [place].  Assessment/Recommendations/Plan  - I am concerned about her decline over the last few days. Will continue to monitor. I suspect she will declare her trajectory over the next day or two.  -Nausea unrelieved by Zofran or reglan- recommend trial lorazepam .25mg  SL q4hr prn nausea   Code Status:  DNR  Prognosis:   Unable to determine  Discharge Planning:  To Be Determined  Care plan was discussed with patient's daughter, Hailey [name].  Thank you for allowing the Palliative Medicine Team to assist in the care of this patient.   Time In: 0800 Time Out: 0830 Total Time 30 mins Prolonged Time Billed No      Greater than 50%  of this time was spent counseling and coordinating care related to the  above assessment and plan.  Mariana Kaufman, AGNP-C Palliative Medicine   Please contact Palliative Medicine Team phone at [phone] for questions and concerns.

## 2016-06-17 NOTE — Progress Notes (Signed)
Complaints of nausea which improved after zofran reglan and 0.25 ativan, pt still has no appetite and is not wanting to eat anything right now, was able to tolerate morning meds

## 2016-06-18 NOTE — Progress Notes (Signed)
Physical Therapy Treatment Patient Details Name: Hailey [name] MRN: [id number] DOB: 10/04/1932 Today's Date: 06/18/2016    History of Present Illness Pt is a 83 y/o F who was sent to the ED from her SNF due to low blood pressure, lethargy, and confusion.  Pt found to have recurrent E coli bacteremia from urinary source.  Pt's PMH includes recent STEMI, CHF, COPD, DVT, fibromyalgia, gout, cerebral aneurysm, PE, shoulder surgery, L TKA.      PT Comments    Pt in bed, lethargic.  Upon questioning stated she was uncomfortable and agreed to repositioning this am.  Pt with max assist to roll from R to L and to support with pillows as needed.  Attempted PROM but pt too sensitive to touch to movement.  Pt with increased edema noted especially in LUE and RLE.  Pt stated she was more comfortable upon repositioning.  Will continue as appropriate.   Follow Up Recommendations  SNF     Equipment Recommendations       Recommendations for Other Services       Precautions / Restrictions Precautions Precautions: Fall;Other (comment) Precaution Comments: monitor O2 Restrictions Weight Bearing Restrictions: No    Mobility  Bed Mobility Overal bed mobility: Needs Assistance Bed Mobility: Rolling Rolling: Total assist         General bed mobility comments: deferred  Transfers                    Ambulation/Gait                 Stairs            Wheelchair Mobility    Modified Rankin (Stroke Patients Only)       Balance                                            Cognition Arousal/Alertness: Lethargic Behavior During Therapy: WFL for tasks assessed/performed Overall Cognitive Status: Within Functional Limits for tasks assessed                                        Exercises Other Exercises Other Exercises: repositioned for comfort per her request.  unable to tolerate PROM, touch painful    General Comments         Pertinent Vitals/Pain Pain Assessment: Faces Faces Pain Scale: Hurts whole lot Pain Location: with movement Pain Descriptors / Indicators: Crying;Discomfort;Grimacing Pain Intervention(s): Limited activity within patient's tolerance;Monitored during session;Repositioned    Home Living                      Prior Function            PT Goals (current goals can now be found in the care plan section) Progress towards PT goals: Not progressing toward goals - comment    Frequency    Min 2X/week      PT Plan Current plan remains appropriate    Co-evaluation              AM-PAC PT "6 Clicks" Daily Activity  Outcome Measure  Difficulty turning over in bed (including adjusting bedclothes, sheets and blankets)?: Total Difficulty moving from lying on back to sitting on the side of the bed? : Total Difficulty sitting  down on and standing up from a chair with arms (e.g., wheelchair, bedside commode, etc,.)?: Total Help needed moving to and from a bed to chair (including a wheelchair)?: Total Help needed walking in hospital room?: Total Help needed climbing 3-5 steps with a railing? : Total 6 Click Score: 6    End of Session Equipment Utilized During Treatment: Oxygen Activity Tolerance: Patient limited by pain Patient left: in bed;with bed alarm set;with call bell/phone within reach         Time: 1022-1030 PT Time Calculation (min) (ACUTE ONLY): 8 min  Charges:  $Therapeutic Activity: 8-22 mins                    G Codes:       Chesley Noon, PTA 06/18/16, 10:46 AM

## 2016-06-18 NOTE — Progress Notes (Signed)
[facility] [place] NAME: Hailey [name]    MR#:  [id number]  DATE OF BIRTH:  April 19, 1932  SUBJECTIVE:  CHIEF COMPLAINT:   Chief Complaint  Patient presents with  . Hypotension     Recent admission with Escherichia coli bacteremia and UTI, sent to rehabilitation with oral antibiotic depending on sensitivities which she finished and sent back again with altered mental status and hypotension   Found to have urosepsis again with UTI.   Blood pressure responded to IV fluids, currently on broad-spectrum IV antibiotics.   06/13/16 morning patient had episode of excessive shaking and was noted to have recording of V. tach on cardiac monitoring.   After giving IV Ativan injection patient is more comfortable.    HR is stable now.   Pt is found to have VRE, and remains altered mentally.   She also have c/o nausea - as per daughter this Is chronic for her.  REVIEW OF SYSTEMS:   Patient is alert but appears to have some dementia or confusion and not able to give any review of system to me.  ROS  DRUG ALLERGIES:   Allergies  Allergen Reactions  . Aspirin Other (See Comments)    Reaction:  GI bleeding   . Codeine Nausea And Vomiting  . Compazine [Prochlorperazine] Other (See Comments)    Confusion  . Ketorolac Tromethamine Other (See Comments)    Reaction:  Headache   . Nsaids Other (See Comments)    Reaction:  GI bleeding   . Phenazopyridine Hcl Other (See Comments)    Reaction:  Vision problems   . Macrodantin [Nitrofurantoin Macrocrystal] Rash    VITALS:  Blood pressure (!) 132/40, pulse 64, temperature 98.1 F (36.7 C), temperature source Oral, resp. rate 20, height 5\' 1"  (1.549 m), weight 72.6 kg (160 lb), SpO2 97 %.  PHYSICAL EXAMINATION:    GENERAL:  83 y.o.-year-old patient lying in the bed with no acute distress. Appears very weak. EYES: Pupils equal, round, reactive to light and accommodation. No scleral icterus. Extraocular muscles  intact.  HEENT: Head atraumatic, normocephalic. Oropharynx and nasopharynx clear.  NECK:  Supple, no jugular venous distention. No thyroid enlargement, no tenderness.  LUNGS: Normal breath sounds bilaterally, no wheezing, rales,rhonchi or crepitation. No use of accessory muscles of respiration.  CARDIOVASCULAR: S1, S2 regular. No murmurs, rubs, or gallops.  ABDOMEN: Soft, nontender, nondistended. Bowel sounds present. No organomegaly or mass.  EXTREMITIES: No pedal edema, cyanosis, or clubbing.  NEUROLOGIC: Cranial nerves II through XII are intact. Muscle strength 3-4/5 in all extremities. Sensation and Gait not checked.  PSYCHIATRIC: The patient is alert and not oriented x 1 have confusion.  SKIN: No obvious rash, lesion, or ulcer.   Physical Exam LABORATORY PANEL:   CBC  Recent Labs Lab 06/17/16 0528  WBC 12.7*  HGB 9.0*  HCT 26.2*  PLT 315   ------------------------------------------------------------------------------------------------------------------  Chemistries   Recent Labs Lab 06/12/16 1845  06/13/16 0836  06/17/16 0528  NA 134*  < >  --   < > 135  K 3.8  < >  --   < > 4.0  CL 99*  < >  --   < > 104  CO2 24  < >  --   < > 25  GLUCOSE 108*  < >  --   < > 128*  BUN 11  < >  --   < > <5*  CREATININE 1.10*  < >  --   < >  0.51  CALCIUM 8.8*  < >  --   < > 8.7*  MG  --   --  1.8  --   --   AST 27  --   --   --   --   ALT 7*  --   --   --   --   ALKPHOS 99  --   --   --   --   BILITOT 0.9  --   --   --   --   < > = values in this interval not displayed. ------------------------------------------------------------------------------------------------------------------  Cardiac Enzymes  Recent Labs Lab 06/13/16 0836 06/13/16 1420  TROPONINI 0.27* 0.30*   ------------------------------------------------------------------------------------------------------------------  RADIOLOGY:  No results found.  ASSESSMENT AND PLAN:   Active Problems:   Altered  mental status   Sepsis ([place])   Palliative care by specialist   Goals of care, counseling/discussion   Advance care planning  * Sepsis with UTI   Recently treated for Escherichia coli bacteremia and UTI, blood culture is sent in this admission still in process- showing gram negative rods.   Possibly this episode appears to be continuation from the last episode.   She was on appropriate antibiotics at the time of discharge as per the culture results available.   CT renal study is negative.   IV fluids, blood pressure is under control.   IV meropenem now, as bl cx is positive , MRSA screen is negative.   Urine cx reported VRE- added linezolid as per ID.- for total 7 days.   Final result on bl cx with pan sensitive E coli- ID suggest change meropenem to ampicilline. May switch to oral amoxicilline - stop date 06/25/16  Still pt have some confusion, so may monitor 1-2 more day in hospital.   Remains lethargic and very poor oral intake.  * Ventricular tachycardia ruled out.   This was noted on cardiac monitoring, appears to be artifact as this was while the patient had excessive shaking.    Cardiology consult appreciated.    Was found to have artifacts  pt was on amiodarone, stopped due to bradycardia.  * Elevated troponin   Most likely this is demand ischemia secondary to sepsis, nontender coagulation at this time, cardiology consult appreciated.  *  History of coronary artery disease   Continue aspirin, plavix, atorvastatin.  * Parkinson's disease   Have to hold carbidopa and levodopa for now- until on linezolid.   * Hypothyroidism    continue levothyroxine, checked- noraml TSH.  * urinary retention   Foley cath placed- after having > 900 ml residual urine .   As per discussion with urology- d/c with foley.   Follow in urology clinic after one week from d/c to try removing cath.  * Acute Anemia   Check Guiac. Negative.   May have un-noticed GI bleed since last d/c from hospital,  as there are several diverticuli noted on CT renal.   Will give oral iron.  * poor oral intake   If does not improve, may need hospice soon.  All the records are reviewed and case discussed with Care Management/Social Workerr. Management plans discussed with the patient, family and they are in agreement.  CODE STATUS: DNR  TOTAL TIME TAKING CARE OF THIS PATIENT: 35 minutes.   POSSIBLE D/C IN 1-2 DAYS, DEPENDING ON CLINICAL CONDITION. Past 4-5 days - not much improvement in  Mental status and oral intake- may be needed comfort care- if continue like this over the  weekend.  Vaughan Basta M.D on 06/18/2016   Between 7am to 6pm - Pager - [phone]  After 6pm go to [email or web address] - password [email or web address] [facility]  Office  [phone]  CC: Primary care physician; Derinda Late, MD  Note: This dictation was prepared with Dragon dictation along with smaller phrase technology. Any transcriptional errors that result from this process are unintentional.

## 2016-06-18 NOTE — Progress Notes (Signed)
OT Cancellation Note  Patient Details Name: Hailey [name] MRN: [id number] DOB: November 18, 1932   Cancelled Treatment:     Patient remains lethargic and has not been able to actively participate in OT evaluation, continue attempts.  Amy T Lovett, OTR/L, CLT   Lovett,Amy 06/18/2016, 10:35 AM

## 2016-06-18 NOTE — Progress Notes (Signed)
Continues to have very poor appetite, refuses ensures, refuses meals, pt sleeping between care, arouses easily but goes back to sleep after care, no noted complaints, daughter updated

## 2016-06-19 LAB — BASIC METABOLIC PANEL
Anion gap: 4 — ABNORMAL LOW (ref 5–15)
CHLORIDE: 102 mmol/L (ref 101–111)
CO2: 26 mmol/L (ref 22–32)
Calcium: 8.5 mg/dL — ABNORMAL LOW (ref 8.9–10.3)
Creatinine, Ser: 0.55 mg/dL (ref 0.44–1.00)
GFR calc non Af Amer: >60 mL/min (ref >60)
Glucose, Bld: 114 mg/dL — ABNORMAL HIGH (ref 65–99)
POTASSIUM: 3.8 mmol/L (ref 3.5–5.1)
Sodium: 132 mmol/L — ABNORMAL LOW (ref 135–145)

## 2016-06-19 LAB — CBC
HEMATOCRIT: 23.2 % — AB (ref 35.0–47.0)
HEMOGLOBIN: 7.9 g/dL — AB (ref 12.0–16.0)
MCH: 29.9 pg (ref 26.0–34.0)
MCHC: 34.1 g/dL (ref 32.0–36.0)
MCV: 87.9 fL (ref 80.0–100.0)
Platelets: 296 10*3/uL (ref 150–440)
RBC: 2.65 MIL/uL — AB (ref 3.80–5.20)
RDW: 14.5 % (ref 11.5–14.5)
WBC: 15.4 10*3/uL — ABNORMAL HIGH (ref 3.6–11.0)

## 2016-06-19 NOTE — Progress Notes (Signed)
[facility] at [place] NAME: Hailey [name]    MR#:  [id number]  DATE OF BIRTH:  [birth year]  SUBJECTIVE:  CHIEF COMPLAINT:   Chief Complaint  Patient presents with  . Hypotension     Recent admission with Escherichia coli bacteremia and UTI, sent to rehabilitation with oral antibiotic depending on sensitivities which she finished and sent back again with altered mental status and hypotension   Found to have urosepsis again with UTI.   Blood pressure responded to IV fluids, currently on broad-spectrum IV antibiotics.   06/13/16 morning patient had episode of excessive shaking and was noted to have recording of V. tach on cardiac monitoring. Stable now.    Pt is found to have VRE, and remains altered mentally.   She also have c/o nausea - as per daughter this Is chronic for her.    For last 3-4 days , she had remained mainly sleepy and not eating much.  REVIEW OF SYSTEMS:   Patient is arousable but appears to have some dementia or confusion and not able to give any review of system to me.  ROS  DRUG ALLERGIES:   Allergies  Allergen Reactions  . Aspirin Other (See Comments)    Reaction:  GI bleeding   . Codeine Nausea And Vomiting  . Compazine [Prochlorperazine] Other (See Comments)    Confusion  . Ketorolac Tromethamine Other (See Comments)    Reaction:  Headache   . Nsaids Other (See Comments)    Reaction:  GI bleeding   . Phenazopyridine Hcl Other (See Comments)    Reaction:  Vision problems   . Macrodantin [Nitrofurantoin Macrocrystal] Rash    VITALS:  Blood pressure (!) 121/44, pulse (!) 58, temperature 97.7 F (36.5 C), temperature source Oral, resp. rate 16, height 5\' 1"  (1.549 m), weight 72.6 kg (160 lb), SpO2 100 %.  PHYSICAL EXAMINATION:    GENERAL:  83 y.o.-year-old patient lying in the bed with no acute distress. Appears very weak. EYES: Pupils equal, round, reactive to light and accommodation. No scleral icterus.  Extraocular muscles intact.  HEENT: Head atraumatic, normocephalic. Oropharynx and nasopharynx clear.  NECK:  Supple, no jugular venous distention. No thyroid enlargement, no tenderness.  LUNGS: Normal breath sounds bilaterally, no wheezing, rales,rhonchi or crepitation. No use of accessory muscles of respiration.  CARDIOVASCULAR: S1, S2 regular. No murmurs, rubs, or gallops.  ABDOMEN: Soft, nontender, nondistended. Bowel sounds present. No organomegaly or mass.  EXTREMITIES: No pedal edema, cyanosis, or clubbing.  NEUROLOGIC: Cranial nerves II through XII are intact. Muscle strength 3-4/5 in all extremities. Sensation and Gait not checked.  PSYCHIATRIC: The patient is drowsy but easily arousable and not oriented x 1 have confusion.  SKIN: No obvious rash, lesion, or ulcer.   Physical Exam LABORATORY PANEL:   CBC  Recent Labs Lab 06/19/16 0507  WBC 15.4*  HGB 7.9*  HCT 23.2*  PLT 296   ------------------------------------------------------------------------------------------------------------------  Chemistries   Recent Labs Lab 06/13/16 0836  06/19/16 0507  NA  --   < > 132*  K  --   < > 3.8  CL  --   < > 102  CO2  --   < > 26  GLUCOSE  --   < > 114*  BUN  --   < > <5*  CREATININE  --   < > 0.55  CALCIUM  --   < > 8.5*  MG 1.8  --   --   < > =  values in this interval not displayed. ------------------------------------------------------------------------------------------------------------------  Cardiac Enzymes  Recent Labs Lab 06/13/16 0836 06/13/16 1420  TROPONINI 0.27* 0.30*   ------------------------------------------------------------------------------------------------------------------  RADIOLOGY:  No results found.  ASSESSMENT AND PLAN:   Active Problems:   Altered mental status   Sepsis ([place])   Palliative care by specialist   Goals of care, counseling/discussion   Advance care planning  * Sepsis with UTI   Recently treated for Escherichia  coli bacteremia and UTI, blood culture is sent in this admission    Possibly this episode appears to be continuation from the last episode.   She was on appropriate antibiotics at the time of discharge as per the culture results available.   CT renal study is negative.   IV fluids, blood pressure is under control.   IV meropenem given initially, as bl cx wass positive , MRSA screen is negative.   Urine cx reported VRE- added linezolid as per ID.- for total 7 days.   Final result on bl cx with pan sensitive E coli- ID suggest change meropenem to ampicilline. May switch to oral amoxicilline - stop date 06/25/16  Still pt have some confusion, so may monitor in hospital.   Remains lethargic and very poor oral intake.  * Ventricular tachycardia ruled out.   This was noted on cardiac monitoring, appears to be artifact as this was while the patient had excessive shaking.    Cardiology consult appreciated.    Was found to have artifacts  pt was on amiodarone, stopped due to bradycardia.  * Elevated troponin   Most likely this is demand ischemia secondary to sepsis, nontender coagulation at this time, cardiology consult appreciated.  *  History of coronary artery disease   Continue aspirin, plavix, atorvastatin.  * Parkinson's disease   Have to hold carbidopa and levodopa for now- until on linezolid.   * Hypothyroidism    continue levothyroxine, checked- noraml TSH.  * urinary retention   Foley cath placed- after having > 900 ml residual urine .   As per discussion with urology- d/c with foley.   Follow in urology clinic after one week from d/c to try removing cath.  * Acute Anemia   Check Guiac. Negative.   May have un-noticed GI bleed since last d/c from hospital, as there are several diverticuli noted on CT renal.   Will give oral iron.   Hb stable now.  * poor oral intake   For last 4-5 days , not much improvement in her condition, remains drowsy, very poor oral intake.   I  discussed with her daughter in detail about possibility of " she may not recovering from this episode". She understand and ready to meet palliative care nurse tomorrow, and discuss comfort care options.   All the records are reviewed and case discussed with Care Management/Social Workerr. Management plans discussed with the patient, family and they are in agreement.  CODE STATUS: DNR  TOTAL TIME TAKING CARE OF THIS PATIENT: 45 minutes.   POSSIBLE D/C IN 1-2 DAYS, DEPENDING ON CLINICAL CONDITION. Past 4-5 days - not much improvement in  Mental status and oral intake- may be needed comfort care.  Vaughan Basta M.D on 06/19/2016   Between 7am to 6pm - Pager - [phone]  After 6pm go to [email or web address] - password [email or web address] [facility]  Office  [phone]  CC: Primary care physician; Derinda Late, MD  Note: This dictation was prepared with Dragon dictation along with smaller phrase technology. Any  transcriptional errors that result from this process are unintentional.

## 2016-06-19 NOTE — Progress Notes (Signed)
Family Meeting Note  Advance Directive:yes  Today a meeting took place with the daughter, POA.  Patient is unable to participate due HY:IFOYDX capacity lethargy   The following clinical team members were present during this meeting:MD  The following were discussed:Patient's diagnosis: recurrent infections, bacteremia, lethargy, failure to thrive. , Patient's progosis: < 6 weeks and Goals for treatment: DNR  Daughter agreed to meet palliative care tomorrow to discuss comfort care.  Additional follow-up to be provided: palliative care.  Time spent during discussion:20 minutes  Virgen Belland, Rosalio Macadamia, MD

## 2016-06-20 DIAGNOSIS — N390 Urinary tract infection, site not specified: Secondary | ICD-10-CM

## 2016-06-20 DIAGNOSIS — Z515 Encounter for palliative care: Secondary | ICD-10-CM

## 2016-06-20 DIAGNOSIS — R7881 Bacteremia: Secondary | ICD-10-CM

## 2016-06-20 DIAGNOSIS — R52 Pain, unspecified: Secondary | ICD-10-CM

## 2016-06-20 MED ORDER — MORPHINE SULFATE (CONCENTRATE) 10 MG/0.5ML PO SOLN
10.0000 mg | ORAL | Status: DC | PRN
  Administered 2016-06-20 (×2): 10 mg via SUBLINGUAL
  Filled 2016-06-20 (×2): qty 1

## 2016-06-20 MED ORDER — LORAZEPAM 2 MG/ML IJ SOLN: 1.0000 mg | INTRAMUSCULAR | Status: DC | PRN

## 2016-06-20 MED ORDER — LORAZEPAM 0.5 MG PO TABS
0.5000 mg | ORAL_TABLET | ORAL | Status: DC | PRN
  Administered 2016-06-20: 0.5 mg via ORAL
  Filled 2016-06-20: qty 1

## 2016-06-20 MED ORDER — MORPHINE SULFATE (CONCENTRATE) 10 MG /0.5 ML PO SOLN: 10.0000 mg | ORAL | 0 refills | Status: AC | PRN

## 2016-06-20 MED ORDER — MORPHINE SULFATE (CONCENTRATE) 10 MG/0.5ML PO SOLN
10.0000 mg | ORAL | Status: DC
  Administered 2016-06-20 (×2): 10 mg via SUBLINGUAL
  Filled 2016-06-20 (×2): qty 1

## 2016-06-20 MED ORDER — BISACODYL 10 MG RE SUPP: 10.0000 mg | Freq: Every day | RECTAL | Status: DC | PRN

## 2016-06-20 NOTE — Care Management Important Message (Signed)
Important Message  Patient Details  Name: Hailey [name] MRN: [id number] Date of Birth: 1933-01-27   Medicare Important Message Given:  Yes    Shelbie Ammons, RN 06/20/2016, 11:17 AM

## 2016-06-20 NOTE — Progress Notes (Signed)
Daily Progress Note   Patient Name: Hailey [name]       Date: 06/20/2016 DOB: Nov 23, 1932  Age: 83 y.o. MRN#: [id number] Attending Physician: [name], MD Primary Care Physician: Derinda Late, MD Admit Date: 06/12/2016  Reason for Consultation/Follow-up: Establishing goals of care  Subjective: Patient occasionally opens eyes to voice but does not answer questions or follow commands. Moans in pain whenever she is touched.   Met with son and daughter in family waiting area. Daughter, Berdine Addison, has cared for her mother for many years and speaks of her being a hypochondriac--playing sick to get attention from others. Berdine Addison speaks of finding her two weeks ago in the fetal position and had a gut feeling that "mother would not rebound from this." She speaks of her mother typically rebounding in days and feels this is the end for her. She confirms DNR/DNI and no life prolonging measures. Her mother has remained fairly independent and would not want her life prolonged if she did not have quality of life. She has not eaten in days.   Discussed transition to comfort measures only with focus on comfort, quality, and dignity at the end of her life. Educated on need for symptom management and scheduled morphine because it appears her mother is in significant pain (winces/moans every time we touch her). Explained that interventions/medications not aimed at comfort will be discontinued. Family agreeable. Educated on hospice services. They request residential hospice placement.   Educated on EOL expectations. Answered questions and concerns. Son and daughter share many stories of their mother and family. Provided emotional and spiritual support.   Length of Stay: 8  Current Medications: Scheduled Meds:  .  mouth rinse  15 mL Mouth Rinse BID  . morphine CONCENTRATE  10 mg Sublingual Q4H    Continuous Infusions:   PRN Meds: acetaminophen **OR** acetaminophen, albuterol, bisacodyl, fluticasone, LORazepam, metoCLOPramide (REGLAN) injection, morphine injection, morphine CONCENTRATE, ondansetron **OR** ondansetron (ZOFRAN) IV  Physical Exam  Constitutional: She is easily aroused. She appears ill. She appears distressed.  Generalized pain  HENT:  Head: Normocephalic and atraumatic.  Cardiovascular: Regular rhythm.  Exam reveals distant heart sounds.   Pulmonary/Chest: Effort normal. No tachypnea. No respiratory distress. She has decreased breath sounds.  Abdominal: Normal appearance. There is tenderness.  Neurological: She is easily aroused. She is disoriented.  Skin: Skin is warm and dry. There is pallor.  Psychiatric: Cognition and memory are impaired. She is inattentive.  Nursing note and vitals reviewed.          Vital Signs: BP (!) 124/40 (BP Location: Left Arm)   Pulse (!) 59   Temp 98.3 F (36.8 C)   Resp 20   Ht 5' 1" (1.549 m)   Wt 72.6 kg (160 lb)   SpO2 93%   BMI 30.23 kg/m  SpO2: SpO2: 93 % O2 Device: O2 Device: Not Delivered O2 Flow Rate: O2 Flow Rate (L/min): 2 L/min  Intake/output summary:   Intake/Output Summary (Last 24 hours) at 06/20/16 1215 Last data filed at 06/20/16 0458  Gross per 24 hour  Intake                0 ml  Output             1050 ml  Net            -1050 ml   LBM: Last BM Date: 06/18/16 Baseline Weight: Weight: 72.6 kg (160 lb) Most recent weight: Weight: 72.6 kg (160 lb)       Palliative Assessment/Data: PPS 20%   Flowsheet Rows     Most Recent Value  Intake Tab  Referral Department  Hospitalist  Unit at Time of Referral  Oncology Unit  Palliative Care Primary Diagnosis  Sepsis/Infectious Disease  Date Notified  06/13/16  Palliative Care Type  New Palliative care  Reason for referral  Clarify Goals of Care  Date of Admission   06/12/16  Date first seen by Palliative Care  06/13/16  # of days Palliative referral response time  0 Day(s)  # of days IP prior to Palliative referral  1  Clinical Assessment  Palliative Performance Scale Score  20%  Psychosocial & Spiritual Assessment  Palliative Care Outcomes  Patient/Family meeting held?  Yes  Who was at the meeting?  patient, daughter, son  Palliative Care Outcomes  Clarified goals of care, Counseled regarding hospice, Changed to focus on comfort, Provided psychosocial or spiritual support, Transitioned to hospice, Provided end of life care assistance, Improved pain interventions, Improved non-pain symptom therapy      Patient Active Problem List   Diagnosis Date Noted  . Sepsis ([place]) 06/13/2016  . Palliative care by specialist   . Goals of care, counseling/discussion   . Advance care planning   . Altered mental status 06/12/2016  . Sepsis secondary to UTI ([place]) 06/01/2016  . Acute ST elevation myocardial infarction (STEMI) involving left anterior descending (LAD) coronary artery ([place]) 05/01/2016  . STEMI (ST elevation myocardial infarction) ([place]) 05/01/2016  . Protein-calorie malnutrition, severe 10/23/2015  . Uncontrollable vomiting   . Nausea and vomiting 10/22/2015  . Tremor 07/07/2015  . Respiratory failure ([place]) 05/19/2015  . Weakness 01/27/2015  . Abdomen enlarged 09/26/2013  . CTCL (cutaneous T-cell lymphoma) ([place]) 09/18/2013  . Anxiety 08/09/2013  . CCF (congestive cardiac failure) ([place]) 08/09/2013  . Chronic pain 08/09/2013  . Chronic diastolic CHF (congestive heart failure) ([place]) 07/12/2013  . Benign essential HTN 07/01/2013  . Bilateral leg edema 06/06/2013  . PAF (paroxysmal atrial fibrillation) ([place]) 04/25/2013  . DOE (dyspnea on exertion) 10/11/2011  . COPD (chronic obstructive pulmonary disease) ([place]) 10/09/2011  . Cognitive decline 10/09/2011  . Behavior concern 08/23/2011  . Chest pain 10/26/2010  . Palpitations 10/26/2010  .  PHLEBITIS AND THROMBOPHLEBITIS OF FEMORAL VEIN 10/13/2008  . DEEP VENOUS THROMBOPHLEBITIS, LEG, LEFT 10/09/2008  .  GASTRIC ULCER, ACUTE 10/09/2008  . PERSONAL HX COLONIC POLYPS 09/02/2008  . FATIGUE 08/27/2008  . HOCM (hypertrophic obstructive cardiomyopathy) ([place]) 11/06/2007  . HYPOTENSION, ORTHOSTATIC 11/06/2007  . ADENOMATOUS COLONIC POLYP 04/12/2007  . HIATAL HERNIA 04/12/2007  . Diverticulosis of colon (without mention of hemorrhage) 04/12/2007  . Hyperlipidemia 02/03/2007  . ANEMIA, CHRONIC 02/03/2007  . MIGRAINE HEADACHE 02/03/2007  . Essential hypertension 02/03/2007  . CEREBRAL ANEURYSM 02/03/2007  . GASTROESOPHAGEAL REFLUX DISEASE 02/03/2007  . OSTEOARTHRITIS 02/03/2007  . FIBROMYALGIA 02/03/2007  . CHRONIC FATIGUE SYNDROME 02/03/2007  . MITRAL VALVE PROLAPSE, HX OF 02/03/2007  . PULMONARY EMBOLISM, HX OF 02/03/2007  . TOTAL KNEE REPLACEMENT, LEFT, HX OF 02/03/2007  . HYSTERECTOMY, HX OF 02/03/2007  . Other acquired absence of organ 02/03/2007  . INGUINAL HERNIORRHAPHY, RIGHT, HX OF 02/03/2007    Palliative Care Assessment & Plan   Patient Profile: 83 y.o. female  with past medical history of Parkinson's, STEMI in March (S/P stenting of LAD), afib, anxiety, cerebral aneurysm, CHF, COPD, fibromyalgia, gout, hypertrophic cardiomyopathy and UTI E.Coli sepsis (d/c'd 4/20 to SNF) admitted on 06/12/2016 from SNF with increasing somnolence, fever (reportedly max 103), found to be hypotensive and hypoxic in the ED. Workup revealed recurrent sepsis likely from UTI, and elevated troponin (likely demand ischemia). Palliative medicine consulted for [place].  Assessment: Sepsis  UTI  E coli bacteremia Sinus tachycardia Elevated troponin Parkinson's disease Hypothyroidism Urinary retention Acute anemia Acute metabolic encephalopathy  Recommendations/Plan:  Comfort measures only. Discontinued labs/interventions/medications not aimed at comfort.  SW consulted for residential  hospice placement.  Symptom management:  Roxanol 57m SL q4h scheduled for pain  Roxanol 125mSL q1h prn breakthrough pain, dyspnea, air hunger  Ativan 52m97mV q4h prn anxiety  Reglan 5mg10m q6h prn nausea  Possible transfer to hospice home today if bed is available. Stable for transfer.  Goals of Care and Additional Recommendations:  Limitations on Scope of Treatment: Full Comfort Care  Code Status: DNR/DNI   Code Status Orders        Start     Ordered   06/13/16 0223  Do not attempt resuscitation (DNR)  Continuous    Question Answer Comment  In the event of cardiac or respiratory ARREST Do not call a "code blue"   In the event of cardiac or respiratory ARREST Do not perform Intubation, CPR, defibrillation or ACLS   In the event of cardiac or respiratory ARREST Use medication by any route, position, wound care, and other measures to relive pain and suffering. May use oxygen, suction and manual treatment of airway obstruction as needed for comfort.      06/13/16 0222    Code Status History    Date Active Date Inactive Code Status Order ID Comments User Context   06/01/2016  3:09 AM 06/03/2016  9:36 PM DNR [id number] Shelling Inpatient   06/01/2016  2:11 AM 06/01/2016  3:09 AM Full Code [id number], [place] Inpatient   05/01/2016  6:24 AM 05/06/2016  8:40 PM DNR [id number] Shelling Inpatient   05/01/2016  5:19 AM 05/01/2016  6:24 AM Full Code [id number] Kida Inpatient   10/22/2015  8:40 PM 10/24/2015 11:40 PM Full Code [id number] Basta Inpatient   05/21/2015 10:34 AM 05/25/2015  5:36 PM DNR [id number] Lipps Inpatient   05/19/2015  4:56 PM 05/21/2015 10:34 AM Full Code [id number] Lipps ED   01/27/2015  5:23  PM 01/28/2015  4:52 PM Full Code [id number]  [name], MD ED    Advance Directive Documentation     Most Recent Value  Type of Advance Directive  Healthcare Power of [place], Out of  facility DNR (pink MOST or yellow form)  Pre-existing out of facility DNR order (yellow form or pink MOST form)  Physician notified to receive inpatient order, Yellow form placed in chart (order not valid for inpatient use)  "MOST" Form in Place?  -       Prognosis:   < 2 weeks: likely days-week due to sepsis from UTI and bacteremia with underlying parkinson's and co-morbidities. Patient with severe functional, cognitive, and nutritional status decline. Family opts for comfort and that life prolonging measures be discontinued.   Discharge Planning:  Hospice facility  Care plan was discussed with patient, son, daughter, Dr. Darvin Neighbours, RN, and SW  Thank you for allowing the Palliative Medicine Team to assist in the care of this patient.   Time In: 0900 Time Out: 1015 Total Time 73mn Prolonged Time Billed  yes      Greater than 50%  of this time was spent counseling and coordinating care related to the above assessment and plan.  MIhor Dow FNP-C Palliative Medicine Team  Phone: [phone]: [phone] Please contact Palliative Medicine Team phone at [phone] questions and concerns.

## 2016-06-20 NOTE — Progress Notes (Addendum)
EMS notified for transport. Patient's son Gershon Mussel and hospital care team all made aware. Thank you. Flo Shanks RN, BSN, [facility] [place], hospital Liaison [phone]

## 2016-06-20 NOTE — Clinical Social Work Note (Signed)
Pt is ready for discharge today and will go to [facility]. Pt has been made comfort care, and pt's daughter chose [facility]. Referral made to Hospice Liaison. Bed available today. Hospice Liaison will make arrangements for transfer. CSW spoke with pt's daughter, who is aware and agreeable to discharge plan. Report will be called. [facility] will provide transportation. CSW is signing off as no further needs identified.   Darden Dates, MSW, LCSW  Clinical Social Worker  [phone]

## 2016-06-20 NOTE — Progress Notes (Signed)
New hospice home referral received from [facility] following a Palliative Medicine consult. Mrs. Swoboda is an 83 year old woman with a past medical history of Parkinson's, STEMI in March (S/P stenting of LAD), afib, anxiety, cerebral aneurysm, CHF, COPD, fibromyalgia, gout, hypertrophic cardiomyopathy and UTI E.Coli sepsis (d/c'd 4/20 to [facility]) admitted on 06/12/2016 from [facility] with increasing lethargy, altered mental status and fever. She was found to be hypotensive and hypoxic in the ED. Workup revealed recurrent sepsis likely from UTI, and elevated troponin.  Palliative Medicine was consulted for goals of care on 4/30, at that time family wanted to continue to treat the infection. Patient has received several days of antibiotics and has continued to decline with poor appetite and increased pain. Family has decided today to focus on comfort with transfer to the hospice home. Writer first spoke on the phone with patient's daughter and Erline Hau ([phone]) to initiate education regarding hospice services, philosophy and team approach to care with good understanding voiced. Questions answered.  Berdine Addison then requested that Probation officer meet with her brother Gershon Mussel, who was present in the room and gave permission for consents to be signed. Writer met with Gershon Mussel and provided education as above. Questions answered, consent signed.  Patient remained asleep with occasional moaning noted, she did not participate in the conversation. Per Tom's report patient has been experiencing increased pain and had received pain medication prior to writers visit. Palliative Medicine NP Ihor Dow made changes to medication this morning and patient is now receiving scheduled liquid morphine as well as PRN. Patient information, discharge summary and consents faxed to referral. Plan is for transfer to the Hospice home this afternoon. Writer to contact EMS for [place] at 3 pm. Family and hospital care team all  made aware.  Signed DNR in place in patient's chart. Thank you. Flo Shanks RN, BSN, [facility] [place], hospital Liaison [phone] c

## 2016-06-20 NOTE — Progress Notes (Signed)
Patient was discharged to the hospice home today, discharge process explained to the patient's son Gershon Mussel. All belongings packed and returned to the patient. IV X1 removed. She was transported via EMS.

## 2016-06-20 NOTE — Progress Notes (Signed)
PT Cancellation Note  Patient Details Name: Hailey [name] MRN: [id number] DOB: Sep 19, 1932   Cancelled Treatment:    Reason Eval/Treat Not Completed: Patient not medically ready   Orders received to D/C PT.      Chesley Noon 06/20/2016, 10:25 AM

## 2016-06-20 NOTE — Discharge Summary (Signed)
[facility] NAME: Hailey [name]    MR#:  [id number]  DATE OF BIRTH:  Dec 28, 1932  DATE OF ADMISSION:  06/12/2016 ADMITTING PHYSICIAN: Hailey Shelling, MD  DATE OF DISCHARGE: 06/20/2016  PRIMARY CARE PHYSICIAN: Hailey Late, MD   ADMISSION DIAGNOSIS:  Sepsis, due to unspecified organism (Hailey [name]) [A41.9] Urinary tract infection without hematuria, site unspecified [N39.0] Altered mental status, unspecified altered mental status type [R41.82] Sepsis (Hailey [name]) [A41.9]  DISCHARGE DIAGNOSIS:  Active Problems:   Altered mental status   Sepsis (Hailey [name])   Palliative care by specialist   Goals of care, counseling/discussion   Advance care planning   SECONDARY DIAGNOSIS:   Past Medical History:  Diagnosis Date  . Anemia    Chronic  . Anxiety   . Arthritis   . Asthma   . Atrophic vaginitis   . Cerebral aneurysm    Hx of  . CHF (congestive heart failure) ([place])   . COPD (chronic obstructive pulmonary disease) ([place])   . Diverticulitis   . DVT (deep vein thrombosis) in pregnancy ([place]) 09/2008   a. LLE, recurrent hx, has IVC filter. Not on coumadin now with history of GI bleeding  . Fibrocystic disease of breast   . Fibromyalgia   . GERD (gastroesophageal reflux disease)   . Gout   . Gross hematuria   . Heart murmur   . History of colonoscopy   . Hyperlipidemia   . Hypertension   . Hypertrophic cardiomyopathy ([place])    a. Echo 4/09 with EF 70-75%, asymmetricy basal septal hypertrophy, mild MR without systolic anterior motion of the mitral valve, LVOT gradient to 130 mmHg with Valsalva b. Echo 7/13: EF 60-65%, mild focal basal septal hypertrophy, no significant LVOT gradient, no MV SAM c. Echo 2/15: EF 55-60%, HOCM, resting LVOT gradient 29 mmHg, Valsalva LVOT gradient > 140 mmHg, mild LVH, mild TR, elevated PASP  . Incomplete bladder emptying   . Migraine headache    Hx of  . Obesity   . OSA (obstructive sleep apnea)    a. Intolerant to CPAP,  wears 2L via n/c  . Osteoarthritis    Hx of left TKR  . PAF (paroxysmal atrial fibrillation) ([place])    a. Full-dose ASA alone 2/2 h/o GIB.  Marland Kitchen PAT (paroxysmal atrial tachycardia) ([place])   . 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 Pneumonia   . PUD (peptic ulcer disease)    With GI bleeding  . Pulmonary embolism ([place])   . Tremor    ADMITTING HISTORY  HISTORY OF PRESENT ILLNESS: Hailey [name]  is a 83 y.o. female with a known history of Anemia, arthritis, cerebral aneurysm, congestive heart failure, COPD, diverticulitis, DVT left lower extremity, IVC filter, not on Coumadin with history of GI bleed, fibromyalgia, GERD, gout, hyperlipidemia, hypertrophic cardiomyopathy, paroxysmal atrial fibrillation presented to the emergency room from rehabilitation facility. Patient was sent because of low blood pressure. Patient is also lethargic and confused. She has not been in her baseline mental status. Recently she was at our hospital was treated and sent to rehabilitation facility. Patient was given IV fluid bolus in the emergency room based on sepsis protocol. Family there is patient's son and daughter who have the power of attorney do not want any IV pressor medications. They just want IV antibiotics and fluids and no aggressive intervention. Patient is DO NOT RESUSCITATE by CODE STATUS. Patient not able to give any history because of lethargy and  confusion.  HOSPITAL COURSE:   * Sepsis with UTI, VRA and E coli E coli bacteremia * Sinus tachycardia * Elevated troponin due to sepsis *  History of coronary artery disease * Parkinson's disease * Hypothyroidism  * urinary retention - Foley in place * Acute Anemia * poor oral intake with malnutrition * Acute metabolic encephalopathy  Treated aggressively with IV abx. Help sought from cardiology, ID and palliative care teams.  Patient continued to deteriorate and family has decided to place patient on comfort measures  for end of life care.  She will be transferred to hospice home.  CONSULTS OBTAINED:  Treatment Team:  Hailey Ramsay, MD  DRUG ALLERGIES:   Allergies  Allergen Reactions  . Aspirin Other (See Comments)    Reaction:  GI bleeding   . Codeine Nausea And Vomiting  . Compazine [Prochlorperazine] Other (See Comments)    Confusion  . Ketorolac Tromethamine Other (See Comments)    Reaction:  Headache   . Nsaids Other (See Comments)    Reaction:  GI bleeding   . Phenazopyridine Hcl Other (See Comments)    Reaction:  Vision problems   . Macrodantin [Nitrofurantoin Macrocrystal] Rash    DISCHARGE MEDICATIONS:   Current Discharge Medication List    START taking these medications   Details  Morphine Sulfate (MORPHINE CONCENTRATE) 10 mg / 0.5 ml concentrated solution Take 0.5 mLs (10 mg total) by mouth every 3 (three) hours as needed for severe pain or shortness of breath. Refills: 0      STOP taking these medications     acetaminophen (TYLENOL) 325 MG tablet      albuterol (PROVENTIL HFA;VENTOLIN HFA) 108 (90 Base) MCG/ACT inhaler      albuterol (PROVENTIL) (2.5 MG/3ML) 0.083% nebulizer solution      amiodarone (PACERONE) 200 MG tablet      aspirin EC 81 MG EC tablet      atorvastatin (LIPITOR) 80 MG tablet      carbidopa-levodopa (SINEMET IR) 25-250 MG tablet      chlorhexidine (PERIDEX) 0.12 % solution      diazepam (VALIUM) 2 MG tablet      dimenhyDRINATE (DRAMAMINE) 50 MG tablet      docusate sodium (COLACE) 100 MG capsule      DULoxetine (CYMBALTA) 30 MG capsule      fluticasone (FLONASE) 50 MCG/ACT nasal spray      gabapentin (NEURONTIN) 100 MG capsule      isosorbide mononitrate (IMDUR) 30 MG 24 hr tablet      levothyroxine (SYNTHROID, LEVOTHROID) 50 MCG tablet      losartan (COZAAR) 25 MG tablet      mometasone-formoterol (DULERA) 100-5 MCG/ACT AERO      montelukast (SINGULAIR) 10 MG tablet      nystatin (MYCOSTATIN) 100000 UNIT/ML  suspension      ondansetron (ZOFRAN-ODT) 4 MG disintegrating tablet      oxyCODONE (OXY IR/ROXICODONE) 5 MG immediate release tablet      pantoprazole (PROTONIX) 40 MG tablet      polyethylene glycol (MIRALAX / GLYCOLAX) packet      potassium chloride (K-DUR,KLOR-CON) 10 MEQ tablet      senna (SENOKOT) 8.6 MG TABS tablet      sucralfate (CARAFATE) 1 g tablet      ticagrelor (BRILINTA) 90 MG TABS tablet      tiotropium (SPIRIVA) 18 MCG inhalation capsule      torsemide (DEMADEX) 20 MG tablet      traZODone (DESYREL) 50 MG  tablet      triamcinolone ointment (KENALOG) 0.1 %      feeding supplement, ENSURE ENLIVE, (ENSURE ENLIVE) LIQD         Today   VITAL SIGNS:  Blood pressure (!) 124/40, pulse (!) 59, temperature 98.3 F (36.8 C), resp. rate 20, height 5\' 1"  (1.549 m), weight 72.6 kg (160 lb), SpO2 93 %.  I/O:   Intake/Output Summary (Last 24 hours) at 06/20/16 1141 Last data filed at 06/20/16 0458  Gross per 24 hour  Intake                0 ml  Output             1050 ml  Net            -1050 ml    PHYSICAL EXAMINATION:  Physical Exam  GENERAL:  83 y.o.-year-old patient lying in the bed with no acute distress.  CARDIOVASCULAR: S1, S2  PSYCHIATRIC: The patient is drowzy  DATA REVIEW:   CBC  Recent Labs Lab 06/19/16 0507  WBC 15.4*  HGB 7.9*  HCT 23.2*  PLT 296    Chemistries   Recent Labs Lab 06/19/16 0507  NA 132*  K 3.8  CL 102  CO2 26  GLUCOSE 114*  BUN <5*  CREATININE 0.55  CALCIUM 8.5*    Cardiac Enzymes  Recent Labs Lab 06/13/16 1420  TROPONINI 0.30*    Microbiology Results  Results for orders placed or performed during the hospital encounter of 06/12/16  Blood Culture (routine x 2)     Status: Abnormal   Collection Time: 06/12/16  6:45 PM  Result Value Ref Range Status   Specimen Description BLOOD RFA  Final   Special Requests   Final    BOTTLES DRAWN AEROBIC AND ANAEROBIC Blood Culture adequate volume   Culture   Setup Time   Final    GRAM NEGATIVE RODS IN BOTH AEROBIC AND ANAEROBIC BOTTLES CRITICAL RESULT CALLED TO, READ BACK BY AND VERIFIED WITHCurt Jews AT 6314 06/13/16 [place] Performed at [facility], [place] [address], [place], [place] [address]    Culture ESCHERICHIA COLI (A)  Final   Report Status 06/15/2016 FINAL  Final   Organism ID, Bacteria ESCHERICHIA COLI  Final      Susceptibility   Escherichia coli - MIC*    AMPICILLIN <=2 SENSITIVE Sensitive     CEFAZOLIN <=4 SENSITIVE Sensitive     CEFEPIME <=1 SENSITIVE Sensitive     CEFTAZIDIME <=1 SENSITIVE Sensitive     CEFTRIAXONE <=1 SENSITIVE Sensitive     CIPROFLOXACIN <=0.25 SENSITIVE Sensitive     GENTAMICIN <=1 SENSITIVE Sensitive     IMIPENEM <=0.25 SENSITIVE Sensitive     TRIMETH/SULFA <=20 SENSITIVE Sensitive     AMPICILLIN/SULBACTAM <=2 SENSITIVE Sensitive     PIP/TAZO <=4 SENSITIVE Sensitive     Extended ESBL NEGATIVE Sensitive     * ESCHERICHIA COLI  Blood Culture ID Panel (Reflexed)     Status: Abnormal   Collection Time: 06/12/16  6:45 PM  Result Value Ref Range Status   Enterococcus species NOT DETECTED NOT DETECTED Final   Listeria monocytogenes NOT DETECTED NOT DETECTED Final   Staphylococcus species NOT DETECTED NOT DETECTED Final   Staphylococcus aureus NOT DETECTED NOT DETECTED Final   Streptococcus species NOT DETECTED NOT DETECTED Final   Streptococcus agalactiae NOT DETECTED NOT DETECTED Final   Streptococcus pneumoniae NOT DETECTED NOT DETECTED Final   Streptococcus pyogenes NOT DETECTED  NOT DETECTED Final   Acinetobacter baumannii NOT DETECTED NOT DETECTED Final   Enterobacteriaceae species DETECTED (A) NOT DETECTED Final    Comment: Enterobacteriaceae represent a large family of gram-negative bacteria, not a single organism. CRITICAL RESULT CALLED TO, READ BACK BY AND VERIFIED WITH:  CHRISTINE KATSOUDAS AT 4034 06/13/16 [facility]    Enterobacter cloacae complex NOT DETECTED NOT DETECTED Final    Escherichia coli DETECTED (A) NOT DETECTED Final    Comment: CRITICAL RESULT CALLED TO, READ BACK BY AND VERIFIED WITH:  [place] AT 7425 06/13/16 S[facility]    Klebsiella oxytoca NOT DETECTED NOT DETECTED Final   Klebsiella pneumoniae NOT DETECTED NOT DETECTED Final   Proteus species NOT DETECTED NOT DETECTED Final   Serratia marcescens NOT DETECTED NOT DETECTED Final   Carbapenem resistance NOT DETECTED NOT DETECTED Final   Haemophilus influenzae NOT DETECTED NOT DETECTED Final   Neisseria meningitidis NOT DETECTED NOT DETECTED Final   Pseudomonas aeruginosa NOT DETECTED NOT DETECTED Final   Candida albicans NOT DETECTED NOT DETECTED Final   Candida glabrata NOT DETECTED NOT DETECTED Final   Candida krusei NOT DETECTED NOT DETECTED Final   Candida parapsilosis NOT DETECTED NOT DETECTED Final   Candida tropicalis NOT DETECTED NOT DETECTED Final  Blood Culture (routine x 2)     Status: Abnormal   Collection Time: 06/12/16  6:47 PM  Result Value Ref Range Status   Specimen Description BLOOD BLOOD LEFT HAND  Final   Special Requests   Final    BOTTLES DRAWN AEROBIC AND ANAEROBIC Blood Culture adequate volume   Culture  Setup Time   Final    GRAM NEGATIVE RODS IN BOTH AEROBIC AND ANAEROBIC BOTTLES CRITICAL VALUE NOTED.  VALUE IS CONSISTENT WITH PREVIOUSLY REPORTED AND CALLED VALUE.    Culture (A)  Final    ESCHERICHIA COLI SUSCEPTIBILITIES PERFORMED ON PREVIOUS CULTURE WITHIN THE LAST 5 DAYS. Performed at [facility], [place] [address] [place], [place] [address]    Report Status 06/15/2016 FINAL  Final  Urine culture     Status: Abnormal   Collection Time: 06/12/16  7:21 PM  Result Value Ref Range Status   Specimen Description URINE, CATHETERIZED  Final   Special Requests NONE  Final   Culture (A)  Final    >=100,000 COLONIES/mL VANCOMYCIN RESISTANT ENTEROCOCCUS   Report Status 06/15/2016 FINAL  Final   Organism ID, Bacteria VANCOMYCIN RESISTANT ENTEROCOCCUS (A)  Final       Susceptibility   Vancomycin resistant enterococcus - MIC*    AMPICILLIN >=32 RESISTANT Resistant     LEVOFLOXACIN >=8 RESISTANT Resistant     NITROFURANTOIN 256 RESISTANT Resistant     VANCOMYCIN >=32 RESISTANT Resistant     LINEZOLID 2 SENSITIVE Sensitive     * >=100,000 COLONIES/mL VANCOMYCIN RESISTANT ENTEROCOCCUS  MRSA PCR Screening     Status: None   Collection Time: 06/13/16  8:18 AM  Result Value Ref Range Status   MRSA by PCR NEGATIVE NEGATIVE Final    Comment:        The GeneXpert MRSA Assay (FDA approved for NASAL specimens only), is one component of a comprehensive MRSA colonization surveillance program. It is not intended to diagnose MRSA infection nor to guide or monitor treatment for MRSA infections.     RADIOLOGY:  No results found.  Follow up with PCP in 1 week.  Management plans discussed with the patient, family and they are in agreement.  CODE STATUS:     Code Status  Orders        Start     Ordered   06/13/16 0223  Do not attempt resuscitation (DNR)  Continuous    Question Answer Comment  In the event of cardiac or respiratory ARREST Do not call a "code blue"   In the event of cardiac or respiratory ARREST Do not perform Intubation, CPR, defibrillation or ACLS   In the event of cardiac or respiratory ARREST Use medication by any route, position, wound care, and other measures to relive pain and suffering. May use oxygen, suction and manual treatment of airway obstruction as needed for comfort.      06/13/16 0222    Code Status History    Date Active Date Inactive Code Status Order ID Comments User Context   06/01/2016  3:09 AM 06/03/2016  9:36 PM DNR [id number]  Hailey Shelling, MD Inpatient   06/01/2016  2:11 AM 06/01/2016  3:09 AM Full Code [id number]  [place], [place], DO Inpatient   05/01/2016  6:24 AM 05/06/2016  8:40 PM DNR [id number]  Hailey Shelling, MD Inpatient   05/01/2016  5:19 AM 05/01/2016  6:24 AM Full Code [id number]  Yolonda Kida, MD Inpatient   10/22/2015  8:40 PM 10/24/2015 11:40 PM Full Code [id number]  Vaughan Basta, MD Inpatient   05/21/2015 10:34 AM 05/25/2015  5:36 PM DNR [id number]  Flora Lipps, MD Inpatient   05/19/2015  4:56 PM 05/21/2015 10:34 AM Full Code [id number]  Flora Lipps, MD ED   01/27/2015  5:23 PM 01/28/2015  4:52 PM Full Code [id number]  [name], MD ED    Advance Directive Documentation     Most Recent Value  Type of Advance Directive  Healthcare Power of [place], Out of facility DNR (pink MOST or yellow form)  Pre-existing out of facility DNR order (yellow form or pink MOST form)  Physician notified to receive inpatient order, Yellow form placed in chart (order not valid for inpatient use)  "MOST" Form in Place?  -      TOTAL TIME TAKING CARE OF THIS PATIENT ON DAY OF DISCHARGE: more than 30 minutes.   [name] R M.D on 06/20/2016 at 11:41 AM  Between 7am to 6pm - Pager - [phone]  After 6pm go to [email or web address] - password [email or web address] [facility]  Office  [phone]  CC: Primary care physician; Hailey Late, MD  Note: This dictation was prepared with Dragon dictation along with smaller phrase technology. Any transcriptional errors that result from this process are unintentional.

## 2016-06-20 NOTE — Progress Notes (Signed)
OT Cancellation Note  Patient Details Name: ESPYN RADWAN MRN: [id number] DOB: 1932-10-27   Cancelled Treatment:    Reason Eval/Treat Not Completed: Patient not medically ready. Orders received to discharge OT services.   Jeni Salles, MPH, MS, OTR/L ascom [phone] 06/20/16, 10:53 AM

## 2016-06-30 ENCOUNTER — Ambulatory Visit: Payer: Self-pay

## 2016-07-15 DEATH — deceased

## 2017-08-22 IMAGING — NM NM HEPATO W/GB/PHARM/[PERSON_NAME]
2 series · 12 of 12 positions shown · non-contrast
Comparison: Right upper quadrant abdominal ultrasound -10/19/2015

CLINICAL DATA: Right upper quadrant abdominal pain for the past 2
weeks with persistent nausea and occasional vomiting.

EXAM:
NUCLEAR MEDICINE HEPATOBILIARY IMAGING WITH GALLBLADDER EF
TECHNIQUE: Sequential images of the abdomen were obtained [DATE] minutes
following intravenous administration of radiopharmaceutical. After
oral ingestion of Ensure, gallbladder ejection fraction was
determined. At 60 min, normal ejection fraction is greater than 33%.
RADIOPHARMACEUTICALS:  5.3 mCi Mc-CCm  Choletec IV

[Series 1000: hepatobiliary scan · 9.59mm/px · 6 of 60 frames shown]
[frame 6/60]
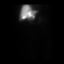
[frame 16/60]
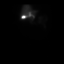
[frame 26/60]
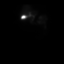
[frame 36/60]
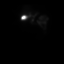
[frame 46/60]
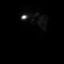
[frame 56/60]
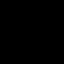

[Series 1000: gallbladder ef · 4.80mm/px · 6 of 120 frames shown]
[frame 11/120]
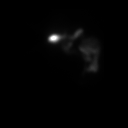
[frame 31/120]
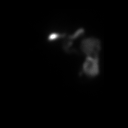
[frame 51/120]
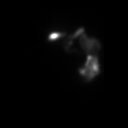
[frame 71/120]
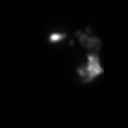
[frame 91/120]
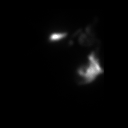
[frame 111/120]
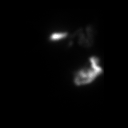

[12 of 12 positions shown; findings below may reference images not displayed]

FINDINGS: There is homogeneous distribution of injected radiotracer throughout
the hepatic parenchyma.

There is early excretion of radiotracer with opacification of the
gallbladder, initially seen on the provided 10 minutes anterior
projection planar image.

There is early excretion of radiotracer with opacification of the
proximal small bowel, initially seen on the provided 10 minutes
anterior projection image.

Following the ingestion of a fatty meal, there is brisk emptying of
the gallbladder with estimated gallbladder ejection fraction of 73%.
(Normal gallbladder ejection fraction with Ensure is greater than
33%.)

The patient did report mild abdominal cramping following the
ingestion of Ensure.
IMPRESSION: 1. No scintigraphic evidence of acute cholecystitis.
2. Indeterminate findings of biliary dyskinesia. Normal gallbladder
ejection fraction, however the patient did report mild abdominal
cramping following the ingestion of Ensure. Clinical correlation is
advised.

## 2018-03-06 IMAGING — CR DG CHEST 2V
2 series · 2 of 2 positions shown · non-contrast
Comparison: May 01, 2016

CLINICAL DATA: Chest pressure and pain

EXAM:
CHEST  2 VIEW

[chest pa]
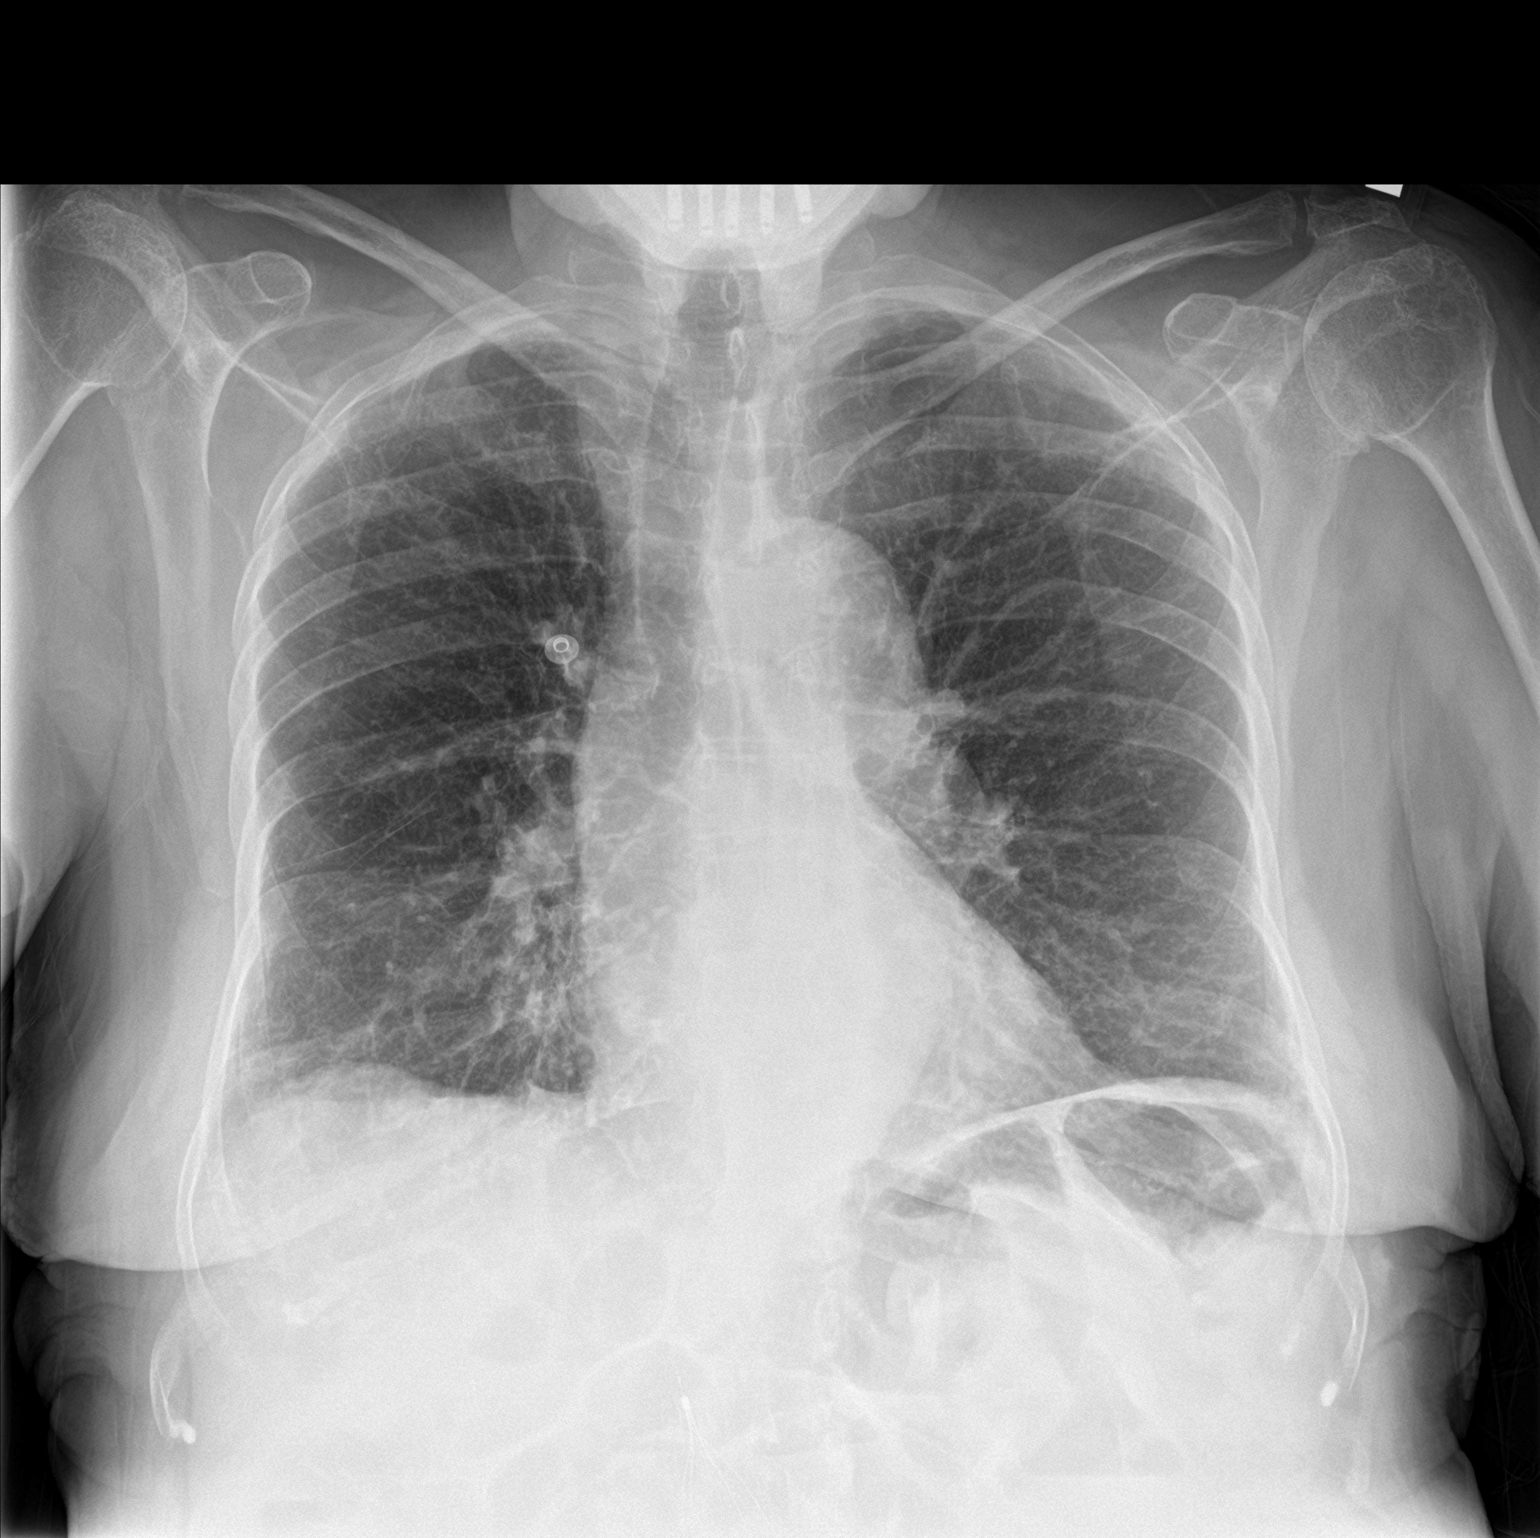

[chest lat]
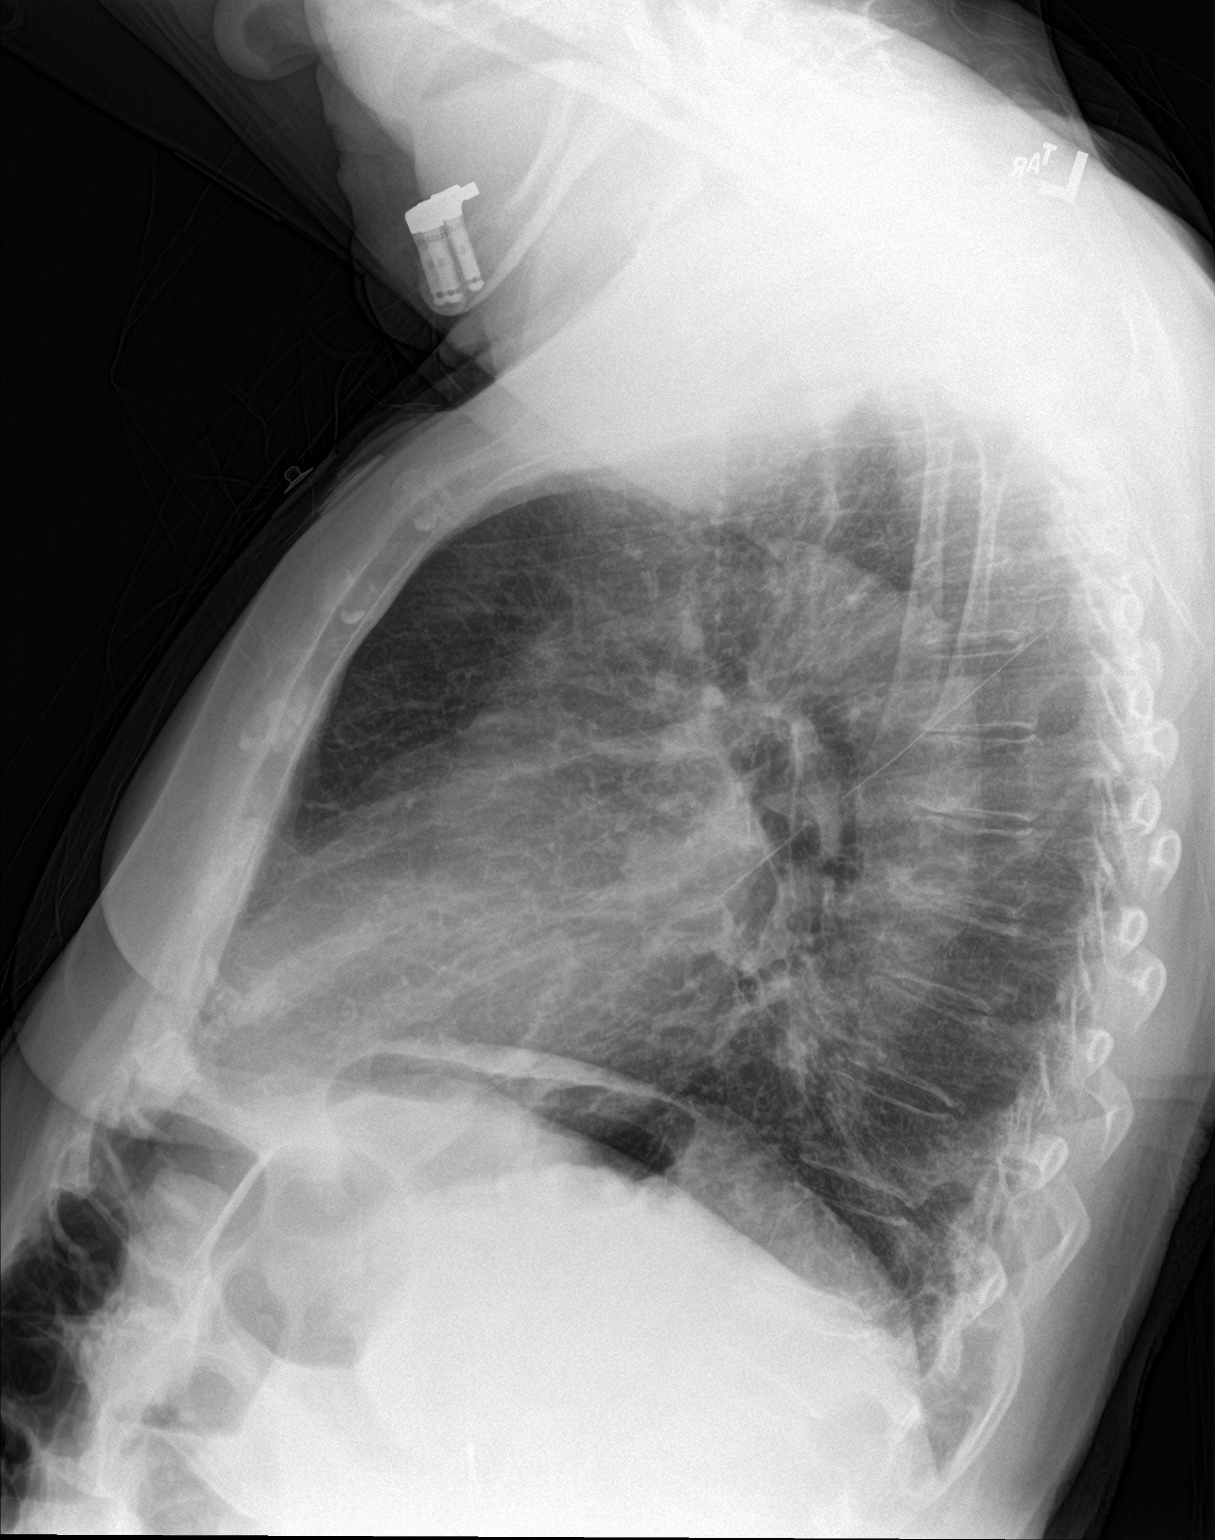

[2 of 2 positions shown; findings below may reference images not displayed]

FINDINGS: Minimal atelectasis in the bases. The heart, hila, mediastinum,
lungs, and pleura are otherwise unremarkable.
IMPRESSION: No active cardiopulmonary disease.
